# Patient Record
Sex: Female | Born: 1935 | Race: White | Hispanic: No | Smoking: Never smoker
Health system: Southern US, Community
[De-identification: ages and names within clinical notes are randomized; demographics above are authoritative.]

## PROBLEM LIST (undated history)

## (undated) DIAGNOSIS — I499 Cardiac arrhythmia, unspecified: Secondary | ICD-10-CM

## (undated) DIAGNOSIS — I10 Essential (primary) hypertension: Secondary | ICD-10-CM

## (undated) DIAGNOSIS — I509 Heart failure, unspecified: Secondary | ICD-10-CM

## (undated) HISTORY — PX: ABDOMINAL HYSTERECTOMY: SHX81

## (undated) HISTORY — DX: Heart failure, unspecified: I50.9

## (undated) HISTORY — DX: Cardiac arrhythmia, unspecified: I49.9

## (undated) HISTORY — DX: Essential (primary) hypertension: I10

## (undated) HISTORY — PX: EYE SURGERY: SHX253

---

## 2005-12-09 DIAGNOSIS — Z950 Presence of cardiac pacemaker: Secondary | ICD-10-CM | POA: Insufficient documentation

## 2005-12-09 HISTORY — PX: PACEMAKER PLACEMENT: SHX43

## 2013-12-09 DIAGNOSIS — Z950 Presence of cardiac pacemaker: Secondary | ICD-10-CM

## 2013-12-09 HISTORY — DX: Presence of cardiac pacemaker: Z95.0

## 2021-12-07 DIAGNOSIS — M7030 Other bursitis of elbow, unspecified elbow: Secondary | ICD-10-CM | POA: Insufficient documentation

## 2021-12-09 HISTORY — PX: MOHS SURGERY: SUR867

## 2022-02-03 ENCOUNTER — Emergency Department
Admission: EM | Admit: 2022-02-03 | Discharge: 2022-02-03 | Disposition: A | Discharge status: Home / Self Care | Payer: Medicare PPO | Attending: Emergency Medicine | Admitting: Emergency Medicine

## 2022-02-03 ENCOUNTER — Emergency Department: Payer: Medicare PPO

## 2022-02-03 DIAGNOSIS — I509 Heart failure, unspecified: Secondary | ICD-10-CM | POA: Diagnosis not present

## 2022-02-03 DIAGNOSIS — W228XXA Striking against or struck by other objects, initial encounter: Secondary | ICD-10-CM | POA: Diagnosis not present

## 2022-02-03 DIAGNOSIS — R7989 Other specified abnormal findings of blood chemistry: Secondary | ICD-10-CM | POA: Diagnosis not present

## 2022-02-03 DIAGNOSIS — S81812A Laceration without foreign body, left lower leg, initial encounter: Secondary | ICD-10-CM | POA: Diagnosis not present

## 2022-02-03 DIAGNOSIS — Z20822 Contact with and (suspected) exposure to covid-19: Secondary | ICD-10-CM | POA: Diagnosis not present

## 2022-02-03 DIAGNOSIS — I4891 Unspecified atrial fibrillation: Secondary | ICD-10-CM

## 2022-02-03 DIAGNOSIS — R0602 Shortness of breath: Secondary | ICD-10-CM | POA: Diagnosis not present

## 2022-02-03 DIAGNOSIS — S8992XA Unspecified injury of left lower leg, initial encounter: Secondary | ICD-10-CM | POA: Diagnosis present

## 2022-02-03 DIAGNOSIS — D649 Anemia, unspecified: Secondary | ICD-10-CM | POA: Diagnosis not present

## 2022-02-03 DIAGNOSIS — R051 Acute cough: Secondary | ICD-10-CM

## 2022-02-03 DIAGNOSIS — Y92481 Parking lot as the place of occurrence of the external cause: Secondary | ICD-10-CM | POA: Diagnosis not present

## 2022-02-03 DIAGNOSIS — Z7901 Long term (current) use of anticoagulants: Secondary | ICD-10-CM | POA: Diagnosis not present

## 2022-02-03 LAB — CBC WITH DIFFERENTIAL/PLATELET
Abs Immature Granulocytes: 0.02 10*3/uL (ref 0.00–0.07)
Basophils Absolute: 0 10*3/uL (ref 0.0–0.1)
Basophils Relative: 0 %
Eosinophils Absolute: 0.2 10*3/uL (ref 0.0–0.5)
Eosinophils Relative: 4 %
HCT: 32.6 % — ABNORMAL LOW (ref 36.0–46.0)
Hemoglobin: 10.7 g/dL — ABNORMAL LOW (ref 12.0–15.0)
Immature Granulocytes: 0 %
Lymphocytes Relative: 16 %
Lymphs Abs: 1 10*3/uL (ref 0.7–4.0)
MCH: 32.8 pg (ref 26.0–34.0)
MCHC: 32.8 g/dL (ref 30.0–36.0)
MCV: 100 fL (ref 80.0–100.0)
Monocytes Absolute: 0.8 10*3/uL (ref 0.1–1.0)
Monocytes Relative: 12 %
Neutro Abs: 4.3 10*3/uL (ref 1.7–7.7)
Neutrophils Relative %: 68 %
Platelets: 198 10*3/uL (ref 150–400)
RBC: 3.26 MIL/uL — ABNORMAL LOW (ref 3.87–5.11)
RDW: 14.3 % (ref 11.5–15.5)
WBC: 6.3 10*3/uL (ref 4.0–10.5)
nRBC: 0.3 % — ABNORMAL HIGH (ref 0.0–0.2)

## 2022-02-03 LAB — BASIC METABOLIC PANEL
Anion gap: 10 (ref 5–15)
BUN: 32 mg/dL — ABNORMAL HIGH (ref 8–23)
CO2: 25 mmol/L (ref 22–32)
Calcium: 8.8 mg/dL — ABNORMAL LOW (ref 8.9–10.3)
Chloride: 98 mmol/L (ref 98–111)
Creatinine, Ser: 0.72 mg/dL (ref 0.44–1.00)
GFR, Estimated: >60 mL/min (ref >60)
Glucose, Bld: 103 mg/dL — ABNORMAL HIGH (ref 70–99)
Potassium: 4.1 mmol/L (ref 3.5–5.1)
Sodium: 133 mmol/L — ABNORMAL LOW (ref 135–145)

## 2022-02-03 LAB — TROPONIN I (HIGH SENSITIVITY)
Troponin I (High Sensitivity): 15 ng/L (ref <18)
Troponin I (High Sensitivity): 16 ng/L (ref <18)

## 2022-02-03 LAB — BRAIN NATRIURETIC PEPTIDE: B Natriuretic Peptide: 648.1 pg/mL — ABNORMAL HIGH (ref 0.0–100.0)

## 2022-02-03 LAB — RESP PANEL BY RT-PCR (FLU A&B, COVID) ARPGX2
Influenza A by PCR: NEGATIVE
Influenza B by PCR: NEGATIVE
SARS Coronavirus 2 by RT PCR: NEGATIVE

## 2022-02-03 MED ORDER — FUROSEMIDE 40 MG PO TABS
40.0000 mg | ORAL_TABLET | Freq: Once | ORAL | Status: AC
  Administered 2022-02-03: 40 mg via ORAL
  Filled 2022-02-03: qty 1

## 2022-02-03 MED ORDER — MICROFIBRILLAR COLL HEMOSTAT EX PADS
MEDICATED_PAD | Freq: Once | CUTANEOUS | Status: DC
  Filled 2022-02-03: qty 1

## 2022-02-03 MED ORDER — ACETAMINOPHEN 500 MG PO TABS
1000.0000 mg | ORAL_TABLET | Freq: Once | ORAL | Status: AC
  Administered 2022-02-03: 1000 mg via ORAL
  Filled 2022-02-03: qty 2

## 2022-02-03 MED ORDER — TETANUS-DIPHTH-ACELL PERTUSSIS 5-2.5-18.5 LF-MCG/0.5 IM SUSY: 0.5000 mL | PREFILLED_SYRINGE | Freq: Once | INTRAMUSCULAR | Status: DC

## 2022-02-03 MED ORDER — LIDOCAINE-EPINEPHRINE 2 %-1:100000 IJ SOLN
30.0000 mL | Freq: Once | INTRAMUSCULAR | Status: AC
Start: 2022-02-03 — End: 2022-02-03
  Administered 2022-02-03: 30 mL

## 2022-02-03 NOTE — ED Notes (Signed)
Family states patient had similar injury last year and received a tetanus injection then. Dr. Tamala Julian is aware.

## 2022-02-03 NOTE — ED Triage Notes (Signed)
Pt via POV from home. Pt accompanied by daughter, family was initially here for cough. Pt has a hx of CHF, when trying to come in to daughter accidentally closed the door on the pt's L leg. Pt has gash on the LLE on the calf, on assessment bleeding is not controlled. Pt roomed to 47. Denies any blood thinners. Dr. Michiel Sites at bedside.

## 2022-02-03 NOTE — ED Notes (Signed)
Surgicell was used to cover the wound per Dr. Katrinka Blazing, dressed with a non-adherent pad and wrapped with Kling. Patient tolerated procedure well.

## 2022-02-03 NOTE — ED Provider Notes (Signed)
[facility] Provider Note    Event Date/Time   First MD Initiated Contact with Patient 02/03/22 1310     (approximate)   History   Laceration   HPI  [name] is a 88 y.o. female with a past medical history of A-fib on Aggrenox, CHF who presents evaluation of 2 issues.  First patient initially was coming to the hospital to be evaluated in outpatient clinic for evaluation of a couple days of cough and congestion and some shortness of breath.  Patient states yesterday she also had a little twinge of chest discomfort.  No chest pain today.  No fevers, vomiting, diarrhea or rash or any other acute sick symptoms prompting them to initially seek evaluation today.  However while in the parking lot patient was struck by a car door in her left posterior calf.  No other injuries associated with this including any pain in ankle or knee.  No other recent injuries per daughter or patient.  Patient is just on ASA but no other anticoagulation.      Physical Exam  Triage Vital Signs: ED Triage Vitals  Enc Vitals Group     BP      Pulse      Resp      Temp      Temp src      SpO2      Weight      Height      Head Circumference      Peak Flow      Pain Score      Pain Loc      Pain Edu?      Excl. in GC?     Most recent vital signs: Vitals:   02/03/22 1317  BP: 114/74  Pulse: 78  Resp: 18  Temp: 98.4 F (36.9 C)  SpO2: 98%    General: Awake, no distress.  CV:  2+ radial pulse. Resp:  Normal effort.  Clear bilaterally. Abd:  No distention.  Soft. Other:  Bilateral lower extreme edema.  Is approximately 4 cm laceration over the posterior lateral calf down to the subcu fat.   ED Results / Procedures / Treatments  Labs (all labs ordered are listed, but only abnormal results are displayed) Labs Reviewed  RESP PANEL BY RT-PCR (FLU A&B, COVID) ARPGX2  CBC WITH DIFFERENTIAL/PLATELET  BASIC METABOLIC PANEL  BRAIN NATRIURETIC PEPTIDE  TROPONIN I  (HIGH SENSITIVITY)  TROPONIN I (HIGH SENSITIVITY)     EKG  EKG is markable for A-fib with intermittently paced ventricular complexes and nonspecific ST changes in inferior and lateral leads.   RADIOLOGY Chest x-ray my interpretation shows cardiomegaly and some bilateral interstitial markings concerning for edema.  There are small bilateral pleural effusions.  No focal consolidation, or other acute process.  I also reviewed radiologist interpretation and agree with their findings.  Plain film the left tib-fib interpreted by myself shows no fracture dislocation.  I also reviewed radiologist interpretation and agree with their findings of no underlying orthopedic injury or radiopaque foreign bodies.  PROCEDURES:  Critical Care performed: No  ..Laceration Repair  Date/Time: 02/03/2022 2:19 PM Performed by: Gilles Chiquito, MD Authorized by: Gilles Chiquito, MD   Consent:    Consent obtained:  Verbal   Consent given by:  Patient   Risks, benefits, and alternatives were discussed: yes     Risks discussed:  Pain, infection, poor cosmetic result, need for additional repair and poor wound healing Universal protocol:  Patient identity confirmed:  Verbally with patient Anesthesia:    Anesthesia method:  Local infiltration   Local anesthetic:  Lidocaine 1% WITH epi Laceration details:    Location:  Leg   Leg location:  L lower leg   Length (cm):  5   Depth (mm):  0.5 Exploration:    Limited defect created (wound extended): no     Hemostasis achieved with:  Epinephrine and direct pressure   Imaging obtained: x-ray     Imaging outcome: foreign body not noted     Wound exploration: entire depth of wound visualized     Contaminated: no   Treatment:    Amount of cleaning:  Extensive   Irrigation solution:  Sterile saline   Irrigation method:  Syringe   Visualized foreign bodies/material removed: no     Debridement:  None   Undermining:  None Skin repair:    Repair method:   Sutures   Suture size:  5-0   Suture material:  Prolene   Suture technique:  Simple interrupted (+3 additonal monocryl applied after initial repair for some persistent oozing)   Number of sutures:  16 (11 prolene, 2 vicryl) Approximation:    Approximation:  Loose Repair type:    Repair type:  Intermediate Post-procedure details:    Dressing:  Open (no dressing)   Procedure completion:  Tolerated well, no immediate complications   MEDICATIONS ORDERED IN ED: Medications  microfibrllar collagen (AVITENE) pad (has no administration in time range)  lidocaine-EPINEPHrine (XYLOCAINE W/EPI) 2 %-1:100000 (with pres) injection 30 mL (30 mLs Infiltration Given 02/03/22 1421)  acetaminophen (TYLENOL) tablet 1,000 mg (1,000 mg Oral Given 02/03/22 1421)     IMPRESSION / MDM / ASSESSMENT AND PLAN / ED COURSE  I reviewed the triage vital signs and the nursing notes.                              With regard to patient's cough and shortness of breath congestion differential considerations include pneumonia, bronchitis, CHF, arrhythmia, anemia, GERD, with lower suspicion for PE given absence of any tachycardia, tachypnea and patiently seeming much more concerned with her congestion and cough with only minimal shortness of breath with exertion.  With regard to the left posterior calf wound it appears to be hemostatic on my exam.  She is able to flex and extend at the knee and distally at the ankle.  Somewhat difficult to palpate pulses due to extent of edema.  Sensation is intact to light touch throughout distally.  No other evidence of trauma to the leg.  Laceration repaired per procedure note above.  Tetanus is already up-to-date.  Surgicel applied due to some persistent oozing with subsequent successful hemostasis.  Chest x-ray my interpretation shows cardiomegaly and some bilateral interstitial markings concerning for edema.  There are small bilateral pleural effusions.  No focal consolidation, or other  acute process.  As reviewed radiologist interpretation and agree with their findings.  Plain film the left tib-fib interpreted by myself shows no fracture dislocation.  I also reviewed radiologist interpretation and agree with their findings of no underlying orthopedic injury or radiopaque foreign bodies.  EKG is markable for A-fib with intermittently paced ventricular complexes and nonspecific ST changes in inferior and lateral leads.  Given nonelevated troponin obtained greater than 3 hours after symptom onset of a very low suspicion for ACS.  BMP without significant electrolyte or metabolic derangements.  BNP is elevated 648 consistent  with findings on chest x-ray and exam for mild volume overload.  I suspect mild CHF exacerbation.  CBC without leukocytosis and some mild anemia with hemoglobin of 10.7.  Discussed with patient taking extra dose of Lasix this evening and increasing her Lasix to 40 twice daily over the next 2 days until she can follow-up with her cardiologist.  She has instructions apparently to do this already and is amenable this plan.  She will follow-up with PCP for wound check and suture removal in 7 to 10 days.  Also placed referral for heart failure clinic because she cannot get into see her cardiologist in the next 2 or 3 days.  Low suspicion for other immediate life-threatening with allergy.  No evidence of respiratory failure.  At this point I think patient stable for discharge with outpatient follow-up.  Discharged in stable condition.  Strict return precautions advised and discussed.        FINAL CLINICAL IMPRESSION(S) / ED DIAGNOSES   Final diagnoses:  Acute cough  Laceration of left lower extremity, initial encounter     Rx / DC Orders   ED Discharge Orders     None        Note:  This document was prepared using Dragon voice recognition software and may include unintentional dictation errors.   Gilles Chiquito, MD 02/03/22 [phone]

## 2022-02-04 ENCOUNTER — Ambulatory Visit: Payer: Self-pay | Admitting: *Deleted

## 2022-02-04 NOTE — Telephone Encounter (Signed)
I returned pt's daughter's call.  Hal Morales.  Mother was seen in the ED yesterday.  She has a wound on her leg that is still leaking blood.   [name] seeking advice before changing drsg today as instructed per ED instructions.   Reason for Disposition  Sutured or stapled surgical incision, questions about  Answer Assessment - Initial Assessment Questions 1. SYMPTOM: "What's the main symptom you're concerned about?" (e.g., drainage, incision opening up, pain, redness)     I returned call to daughter.   Mother on blood thinner.  Her wound is bleeding a little.   I'm to change the drsg today.    It's bleeding through the drsg.    She has 13 sutures from a car door injury.   She's also in CHF.    They stitched the wound yesterday.     There is blood around the leg on left lower leg.    2. ONSET: "When did bleeding  start?"     The bleeding was stopped before leaving the ED.   This morning it started bleeding. 3. SURGERY: "What surgery did you have?"     Had a laceration sutured yesterday.  She's on Aggrenox.    4. DATE of SURGERY: "When was the surgery?"      Yesterday in ED 5. INCISION SITE: "Where is the incision located?"      Left lower leg calf 6. REDNESS: "Is there any redness at the incision site?" If yes, ask: "How wide across is the redness?" (Inches, centimeters)      *No Answer* 7. PAIN: "Is there any pain?" If Yes, ask: "How bad is it?"  (Scale 1-10; or mild, moderate, severe)   - NONE (0): no pain   - MILD (1-3): doesn't interfere with normal activities    - MODERATE (4-7): interferes with normal activities or awakens from sleep    - SEVERE (8-10): excruciating pain, unable to do any normal activities     *No Answer* 8. BLEEDING: "Is there any bleeding?" If Yes, ask: "How much?" and "Where?"     Yes on blood thinners 9. DRAINAGE: "Is there any drainage from the incision site?" If yes, ask: "What color and how much?" (e.g., red, cloudy, pus; drops, teaspoon)     *No  Answer* 10. FEVER: "Do you have a fever?" If Yes, ask: "What is your temperature, how was it measured, and when did it start?"       *No Answer* 11. OTHER SYMPTOMS: "Do you have any other symptoms?" (e.g., dizziness, rash elsewhere on body, shaking chills, weakness)       *No Answer*  Protocols used: Post-Op Incision Symptoms and Questions-A-AH

## 2022-02-04 NOTE — Telephone Encounter (Signed)
°  Chief Complaint: laceration that is sutured from yesterday is oozing blood through the drsg.   On blood thinners.   How should I change the drsg? Symptoms: Instructed to change drsg to today and instructions given but wanted to know what to do about the bleeding. Frequency: Sutured yesterday in the ED.   Pertinent Negatives: Patient denies infection.   Disposition: [] ED /[] Urgent Care (no appt availability in office) / [] Appointment(In office/virtual)/ []  [facility]/ [x] Home Care/ [] Refused Recommended Disposition /[] [place] [facility]/ []  Follow-up with PCP Additional Notes: Spoke with pt's daughter .   Mother is from out of  town staying with daughter so doesn't have a PCP here.   Does have an appt with the CHF Clinic for tomorrow.

## 2022-02-05 ENCOUNTER — Ambulatory Visit (HOSPITAL_BASED_OUTPATIENT_CLINIC_OR_DEPARTMENT_OTHER): Payer: Medicare PPO | Admitting: Family

## 2022-02-05 ENCOUNTER — Telehealth: Payer: Self-pay | Admitting: Family

## 2022-02-05 ENCOUNTER — Other Ambulatory Visit
Admission: RE | Admit: 2022-02-05 | Discharge: 2022-02-05 | Disposition: A | Discharge status: Home / Self Care | Payer: Medicare PPO | Source: Ambulatory Visit | Attending: Family | Admitting: Family

## 2022-02-05 ENCOUNTER — Other Ambulatory Visit: Payer: Self-pay

## 2022-02-05 ENCOUNTER — Encounter: Payer: Self-pay | Admitting: Family

## 2022-02-05 VITALS — BP 141/70 | HR 81 | Resp 16 | Ht 59.0 in | Wt 112.4 lb

## 2022-02-05 DIAGNOSIS — R0989 Other specified symptoms and signs involving the circulatory and respiratory systems: Secondary | ICD-10-CM | POA: Insufficient documentation

## 2022-02-05 DIAGNOSIS — H3530 Unspecified macular degeneration: Secondary | ICD-10-CM | POA: Insufficient documentation

## 2022-02-05 DIAGNOSIS — H9190 Unspecified hearing loss, unspecified ear: Secondary | ICD-10-CM | POA: Insufficient documentation

## 2022-02-05 DIAGNOSIS — I4891 Unspecified atrial fibrillation: Secondary | ICD-10-CM | POA: Insufficient documentation

## 2022-02-05 DIAGNOSIS — X58XXXA Exposure to other specified factors, initial encounter: Secondary | ICD-10-CM | POA: Insufficient documentation

## 2022-02-05 DIAGNOSIS — Y92481 Parking lot as the place of occurrence of the external cause: Secondary | ICD-10-CM | POA: Insufficient documentation

## 2022-02-05 DIAGNOSIS — R0601 Orthopnea: Secondary | ICD-10-CM | POA: Insufficient documentation

## 2022-02-05 DIAGNOSIS — Z79899 Other long term (current) drug therapy: Secondary | ICD-10-CM | POA: Insufficient documentation

## 2022-02-05 DIAGNOSIS — I5043 Acute on chronic combined systolic (congestive) and diastolic (congestive) heart failure: Secondary | ICD-10-CM

## 2022-02-05 DIAGNOSIS — I480 Paroxysmal atrial fibrillation: Secondary | ICD-10-CM | POA: Insufficient documentation

## 2022-02-05 DIAGNOSIS — M791 Myalgia, unspecified site: Secondary | ICD-10-CM | POA: Insufficient documentation

## 2022-02-05 DIAGNOSIS — Z950 Presence of cardiac pacemaker: Secondary | ICD-10-CM | POA: Insufficient documentation

## 2022-02-05 DIAGNOSIS — S81819A Laceration without foreign body, unspecified lower leg, initial encounter: Secondary | ICD-10-CM | POA: Insufficient documentation

## 2022-02-05 DIAGNOSIS — S81812D Laceration without foreign body, left lower leg, subsequent encounter: Secondary | ICD-10-CM

## 2022-02-05 LAB — BASIC METABOLIC PANEL
Anion gap: 7 (ref 5–15)
BUN: 34 mg/dL — ABNORMAL HIGH (ref 8–23)
CO2: 29 mmol/L (ref 22–32)
Calcium: 9.1 mg/dL (ref 8.9–10.3)
Chloride: 96 mmol/L — ABNORMAL LOW (ref 98–111)
Creatinine, Ser: 0.82 mg/dL (ref 0.44–1.00)
GFR, Estimated: >60 mL/min (ref >60)
Glucose, Bld: 100 mg/dL — ABNORMAL HIGH (ref 70–99)
Potassium: 4.2 mmol/L (ref 3.5–5.1)
Sodium: 132 mmol/L — ABNORMAL LOW (ref 135–145)

## 2022-02-05 MED ORDER — METOLAZONE 2.5 MG PO TABS: 2.5000 mg | ORAL_TABLET | Freq: Every day | ORAL | 0 refills | Status: DC

## 2022-02-05 NOTE — Patient Instructions (Addendum)
Metolazone 2.5 mg was sent to your pharmacy, pick this up today.  Start taking metolazone on Wednesday morning and Thursday morning  Take this 30 minutes BEFORE you take your Lasix  Call us Friday am and   Return next week

## 2022-02-05 NOTE — Progress Notes (Signed)
Name: [name]      DOB:  02/02/1925     MRN:  [id number]  HPI  [name] is a 88 year old female with a history of afib, pacemaker, and unspecified heart failure. She has been staying with her daughter in the area for several months and is considering moving here to live with her. Has a history of heart failure since "2007", reports she does not recall having an echo or what her heart function may be.   Recently in the ED on 02/03/22 for SOB and cough, as well as a leg laceration that occurred while in the ED parking lot. CXR showed prominent central pulmonary vessels and increased interstitial markings, suggesting CHF. BNP 648. She was given lasix 40 mg PO to take, and was felt to be stable to dc back home.   She weighs daily and her family keeps a meticulous diary of what she eats and her weights. On 01/31/22, her weigh had fluctuated up 3 lbs and she took an additional dose of lasix, per her plan with her cardiologist. On 02/03/22, her weight had dropped 2 lbs but she still had a cough and pedal edema was worse than usual, they elected to go to the ED. She was instructed to take an additional dose of lasix x 3 days and follow up with the HF clinic.   She presents today to the Heart Failure clinic with a chief complaint of worsening fatigue, SOB, orthopnea, worsening pedal edema and cough. She denies CP, palpitations, dizziness, or abdominal edema.  She ambulated into the clinic with assistance of rollator, no DOE, able to speak in complete sentences and in no apparent distress.    Past Medical History:  Diagnosis Date   Congestive heart failure (CHF) (HCC)    Pacemaker 2015    No family history on file. Social History   Tobacco Use   Smoking status: Not on file   Smokeless tobacco: Not on file  Substance Use Topics   Alcohol use: Not on file   Not on File  Prior to Admission medications   Medication Sig Start Date End Date Taking? Authorizing Provider  amLODipine (NORVASC) 5 MG  tablet Take 5 mg by mouth daily. 11/16/21   [provider]  atorvastatin (LIPITOR) 10 MG tablet Take 10 mg by mouth daily. 01/11/22   [provider]  dipyridamole-aspirin (AGGRENOX) 200-25 MG 12hr capsule Take 1 capsule by mouth 2 (two) times daily. 12/13/21   [provider]  furosemide (LASIX) 40 MG tablet Take 40 mg by mouth daily as needed. 12/14/21   [provider]  losartan (COZAAR) 50 MG tablet Take 50 mg by mouth 2 (two) times daily. 01/11/22   [provider]  metoprolol (TOPROL-XL) 200 MG 24 hr tablet Take 200 mg by mouth daily.    [provider]  Multiple Vitamins-Minerals (PRESERVISION AREDS 2 PO) Take 1 tablet by mouth in the morning and at bedtime.    [provider]  multivitamin-iron-minerals-folic acid (CENTRUM) chewable tablet Chew 1 tablet by mouth daily.    [provider]  Omega-3 Fatty Acids (FISH OIL) 1000 MG CAPS Take 1 capsule by mouth daily.    [provider]  potassium chloride (KLOR-CON) 10 MEQ tablet Take 10 mEq by mouth daily.    [provider]      Vitals:   02/05/22 0911  BP: (!) 141/70  Pulse: 81  Resp: 16  SpO2: 100%  Weight: 112 lb 7 oz (  51 kg)  Height: 4\' 11"  (1.499 m)   Wt Readings from Last 3 Encounters:  02/05/22 112 lb 7 oz (51 kg)  02/03/22 114 lb (51.7 kg)   Lab Results  Component Value Date   CREATININE 0.82 02/05/2022   CREATININE 0.72 02/03/2022    Review of Systems  Constitutional:  Positive for malaise/fatigue.  HENT:  Positive for hearing loss.   Eyes:        Macular degeneration  Respiratory:  Positive for cough, sputum production and shortness of breath. Negative for wheezing.   Cardiovascular:  Positive for orthopnea and leg swelling. Negative for chest pain and palpitations.  Gastrointestinal: Negative.   Genitourinary: Negative.   Musculoskeletal:  Positive for myalgias.  Skin: Negative.   Neurological:  Negative for dizziness, loss of  consciousness, weakness and headaches.  Endo/Heme/Allergies:  Bruises/bleeds easily.  Psychiatric/Behavioral: Negative.      Physical Exam Vitals and nursing note reviewed. Exam conducted with a chaperone present (Daughter).  Constitutional:      General: She is not in acute distress.    Appearance: She is not toxic-appearing.  Neck:     Vascular: No JVD.  Cardiovascular:     Rate and Rhythm: Normal rate and regular rhythm.  Pulmonary:     Effort: Pulmonary effort is normal.     Breath sounds: Examination of the right-lower field reveals rales. Rales present. No wheezing or rhonchi.  Musculoskeletal:     Right lower leg: Edema (+2) present.     Left lower leg: Edema (+2) present.  Neurological:     Mental Status: She is alert and oriented to person, place, and time.     Assessment & Plan:  Unspecified heart failure - NYHA III - slightly volume overloaded, rales noted, endorses fatigue and SOB worse than normal - unclear EF, from out of town and no records to indicate - on cozaar, toprol, norvasc, lasix QD - add metolazone 2.5 mg po x 2 days, take 30 min prior to lasix, take potassium chloride with each dose of metolazone, take on Wed and Thurs of this week - call HF clinic Friday am to report weight and symptoms - wears compression socks daily, endorses that pedal edema is slightly worse than normal - goes to an adult day care each day, reports that she eats low sodium diet there - usually sleeps on 1 pillow, last two nights she has required 3  - will check BMP today - BNP 648 on 02/03/22  Atrial fibrillation - aggrenox - RRR today, has pacemaker - sees cardiologist [facility]) in [place], but if she moves here will need a new cardiologist  Leg laceration - bandage CDI, surrounding skin pink and dry   Medications bottles were brought and reviewed.  Call the office on Friday to report symptoms. Return in 1 week, sooner if needed.

## 2022-02-06 NOTE — Telephone Encounter (Signed)
Agree with NP student's plan to have patient take her potassium each day she takes metolazone.  ?

## 2022-02-13 ENCOUNTER — Other Ambulatory Visit
Admission: RE | Admit: 2022-02-13 | Discharge: 2022-02-13 | Disposition: A | Discharge status: Home / Self Care | Payer: Medicare PPO | Source: Ambulatory Visit | Attending: Family | Admitting: Family

## 2022-02-13 ENCOUNTER — Encounter: Payer: Self-pay | Admitting: Family

## 2022-02-13 ENCOUNTER — Other Ambulatory Visit: Payer: Self-pay

## 2022-02-13 ENCOUNTER — Ambulatory Visit (HOSPITAL_BASED_OUTPATIENT_CLINIC_OR_DEPARTMENT_OTHER): Payer: Medicare PPO | Admitting: Family

## 2022-02-13 VITALS — BP 142/79 | HR 86 | Resp 16 | Ht 59.0 in | Wt 115.0 lb

## 2022-02-13 DIAGNOSIS — I4891 Unspecified atrial fibrillation: Secondary | ICD-10-CM | POA: Insufficient documentation

## 2022-02-13 DIAGNOSIS — I890 Lymphedema, not elsewhere classified: Secondary | ICD-10-CM | POA: Insufficient documentation

## 2022-02-13 DIAGNOSIS — Z950 Presence of cardiac pacemaker: Secondary | ICD-10-CM | POA: Insufficient documentation

## 2022-02-13 DIAGNOSIS — S81812D Laceration without foreign body, left lower leg, subsequent encounter: Secondary | ICD-10-CM

## 2022-02-13 DIAGNOSIS — I509 Heart failure, unspecified: Secondary | ICD-10-CM | POA: Insufficient documentation

## 2022-02-13 DIAGNOSIS — I480 Paroxysmal atrial fibrillation: Secondary | ICD-10-CM | POA: Diagnosis not present

## 2022-02-13 DIAGNOSIS — S81812A Laceration without foreign body, left lower leg, initial encounter: Secondary | ICD-10-CM | POA: Insufficient documentation

## 2022-02-13 DIAGNOSIS — I5042 Chronic combined systolic (congestive) and diastolic (congestive) heart failure: Secondary | ICD-10-CM | POA: Insufficient documentation

## 2022-02-13 DIAGNOSIS — X58XXXA Exposure to other specified factors, initial encounter: Secondary | ICD-10-CM | POA: Insufficient documentation

## 2022-02-13 LAB — BASIC METABOLIC PANEL
Anion gap: 6 (ref 5–15)
BUN: 28 mg/dL — ABNORMAL HIGH (ref 8–23)
CO2: 31 mmol/L (ref 22–32)
Calcium: 8.6 mg/dL — ABNORMAL LOW (ref 8.9–10.3)
Chloride: 87 mmol/L — ABNORMAL LOW (ref 98–111)
Creatinine, Ser: 0.61 mg/dL (ref 0.44–1.00)
GFR, Estimated: >60 mL/min (ref >60)
Glucose, Bld: 102 mg/dL — ABNORMAL HIGH (ref 70–99)
Potassium: 4.2 mmol/L (ref 3.5–5.1)
Sodium: 124 mmol/L — ABNORMAL LOW (ref 135–145)

## 2022-02-13 MED ORDER — TORSEMIDE 20 MG PO TABS: 40.0000 mg | ORAL_TABLET | Freq: Every day | ORAL | 3 refills | Status: DC

## 2022-02-13 NOTE — Patient Instructions (Addendum)
Continue weighing daily and call for an overnight weight gain of 3 pounds or more or a weekly weight gain of more than 5 pounds. ? ? ?If you have voicemail, please make sure your mailbox is cleaned out so that we may leave a message and please make sure to listen to any voicemails.  ? ? ?Stop taking your furosemide as I am changing your fluid pill to torsemide. You will take 2 tablets every morning and can take an additional tablet if needed for above weight gain, worsening swelling or shortness of breath.   ? ? ?

## 2022-02-13 NOTE — Progress Notes (Signed)
Name: [name]      DOB:  1925/11/05     MRN:  [id number] ? ?HPI ? ?[name] is a 88 year old female with a history of afib, pacemaker, and unspecified heart failure.  ? ?Recently in the ED on 02/03/22 for SOB and cough, as well as a leg laceration that occurred while in the ED parking lot. CXR showed prominent central pulmonary vessels and increased interstitial markings, suggesting CHF. BNP 648. She was given lasix 40 mg PO to take, and was felt to be stable to dc back home.  ? ?She presents today for a follow-up visit with a chief complaint of moderate fatigue with minimal exertion. She describes this as chronic in nature. She has associated pedal edema along with this. She denies any difficulty sleeping, shortness of breath, cough, palpitations, chest pain or weight gain.  ? ?She says that she feels like the leg swelling is worse although she says that her cough has resolved after taking 2 doses of metolazone last week. She goes to an adult daycare daily. She and her daughter say that she drinks ~ 7 cups of fluids daily but is unable to quantify how much the cup holds. Denies that she's drinking too much.  ? ?Has been taking furosemide for ~ 20 years. Continues to have a wound on her left calf that has stitches still in. Does mention that she's had this "strange sensation" in her left lower leg, shin and calf area. Doesn't feel like a muscle cramp exactly but has occurred intermittently since she took metolazone last week.  ? ?Discussed needing to get an echo as well as get established with cardiology. She and daughter say that they've talked about this and would like to get established with [facility] cardiology.  ? ?Past Medical History:  ?Diagnosis Date  ? Arrhythmia   ? atrial fibrillation  ? Congestive heart failure (CHF) (HCC)   ? Pacemaker 2015  ? ? ?No family history on file. ?Social History  ? ?Tobacco Use  ? Smoking status: Never  ? Smokeless tobacco: Never  ?Substance Use Topics  ? Alcohol use: Not on  file  ? ?Allergies  ?Allergen Reactions  ? Codeine   ? ?  ?Prior to Admission medications   ?Medication Sig Start Date End Date Taking? Authorizing Provider  ?amLODipine (NORVASC) 5 MG tablet Take 5 mg by mouth daily. 11/16/21  Yes [provider]  ?atorvastatin (LIPITOR) 10 MG tablet Take 10 mg by mouth daily. 01/11/22  Yes [provider]  ?dipyridamole-aspirin (AGGRENOX) 200-25 MG 12hr capsule Take 1 capsule by mouth 2 (two) times daily. 12/13/21  Yes [provider]  ?furosemide (LASIX) 40 MG tablet Take 40 mg by mouth daily. Can take an extra as needed for weight gain 12/14/21  Yes [provider]  ?losartan (COZAAR) 50 MG tablet Take 50 mg by mouth 2 (two) times daily. 01/11/22  Yes [provider]  ?metolazone (ZAROXOLYN) 2.5 MG tablet Take 1 tablet (2.5 mg total) by mouth daily. 02/05/22 05/06/22 Yes Delma Freeze, FNP  ?metoprolol (TOPROL-XL) 200 MG 24 hr tablet Take 200 mg by mouth daily.   Yes [provider]  ?Multiple Vitamins-Minerals (PRESERVISION AREDS 2 PO) Take 1 tablet by mouth in the morning and at bedtime.   Yes [provider]  ?multivitamin-iron-minerals-folic acid (CENTRUM) chewable tablet Chew 1 tablet by mouth daily.   Yes [provider]  ?Omega-3 Fatty Acids (FISH OIL) 1000 MG CAPS Take 1 capsule by  mouth daily.   Yes [provider]  ?potassium chloride (KLOR-CON) 10 MEQ tablet Take 10 mEq by mouth as needed. Only if she takes an additional dose of lasix   Yes [provider]  ? ?Review of Systems  ?Constitutional:  Positive for malaise/fatigue.  ?HENT:  Positive for hearing loss. Negative for congestion.   ?Eyes: Negative.   ?     Macular degeneration  ?Respiratory:  Negative for cough, sputum production, shortness of breath and wheezing.   ?Cardiovascular:  Positive for orthopnea and leg swelling (worsening). Negative for chest pain and palpitations.  ?Gastrointestinal: Negative.   ?Genitourinary:  Negative.   ?Musculoskeletal:  Positive for myalgias.  ?Skin: Negative.   ?Neurological:  Negative for dizziness, loss of consciousness, weakness and headaches.  ?Endo/Heme/Allergies:  Bruises/bleeds easily.  ?Psychiatric/Behavioral: Negative.     ? ?Vitals:  ? 02/13/22 1513  ?BP: (!) 142/79  ?Pulse: 86  ?Resp: 16  ?SpO2: 93%  ?Weight: 115 lb (52.2 kg)  ?Height: 4\' 11"  (1.499 m)  ? ?Wt Readings from Last 3 Encounters:  ?02/13/22 115 lb (52.2 kg)  ?02/05/22 112 lb 7 oz (51 kg)  ?02/03/22 114 lb (51.7 kg)  ? ?Lab Results  ?Component Value Date  ? CREATININE 0.82 02/05/2022  ? CREATININE 0.72 02/03/2022  ? ?Physical Exam ?Vitals and nursing note reviewed. Exam conducted with a chaperone present (Daughter).  ?Constitutional:   ?   General: She is not in acute distress. ?   Appearance: She is not toxic-appearing.  ?Neck:  ?   Vascular: No JVD.  ?Cardiovascular:  ?   Rate and Rhythm: Normal rate and regular rhythm.  ?Pulmonary:  ?   Effort: Pulmonary effort is normal.  ?   Breath sounds: No wheezing, rhonchi or rales.  ?Musculoskeletal:  ?   Right lower leg: Edema (+2) present.  ?   Left lower leg: Edema (+2) present.  ?Neurological:  ?   Mental Status: She is alert and oriented to person, place, and time.  ?  ? ?Assessment & Plan: ? ?Unspecified heart failure ?- NYHA III ?- euvolemic today ?- weighing daily and home weight chart reviewed (111 lb yesterday at home); reminded to call for an overnight weight gain of > 2 pounds or a weekly weight gain of > 5 pounds ?- weight up 3 pounds from last visit here 10 days ago ?- took 2 doses of metolazone last week with resolution of her cough ?- goes to an adult day care each day, reports that she eats low sodium diet there ?- drinking ~ 7 cups of fluids daily; unable to quantify how many ounces the cup holds but doesn't think she's drinking too much as this is the normal amount for her ?- usually sleeps on 1 pillow, now sleeping on 2-3 ?- echo has been scheduled for 02/26/22 ?-  has been on furosemide for ~ 20 years so will change to torsemide 40mg  QAM with additional 20mg  PRN ?- check BMP today to make sure she's not hypokalemic ?- BNP 648 on 02/03/22 ? ?Atrial fibrillation ?- aggrenox ?- RRR today, has pacemaker ?- requests [facility] cardiology so this was scheduled with Dr. for 03/22/22 ?- BMP 02/05/22 reviewed and showed sodium 132, potassium 4.2, creatinine 0.82 & GFR >60 ? ?Left Leg laceration ?- bandage CDI, surrounding skin pink and dry ?- stiches remain intact ? ?4: Lymphedema- ?- stage 2 ?- wears compression socks daily, endorses that pedal edema is slightly worse than normal ?- doesn't resolve much  overnight and didn't notice any difference after taking metolazone ?- is quite active and walked into the office ?- goes to an adult daycare daily ?- consider compression boots after laceration heals ?- may also need vascular referral ? ? ?Medications bottles were brought and reviewed. ? ?Return in 3 weeks, sooner if needed.  ? ? ? ? ? ?

## 2022-02-14 ENCOUNTER — Telehealth: Payer: Self-pay | Admitting: Family

## 2022-02-14 ENCOUNTER — Other Ambulatory Visit: Payer: Self-pay | Admitting: Family

## 2022-02-14 ENCOUNTER — Encounter: Payer: Self-pay | Admitting: Family

## 2022-02-14 NOTE — Telephone Encounter (Signed)
Spoke with daughter regarding lab results obtained yesterday. Renal function and potassium levels are normal. Sodium level is low at 124. Spoke with Dr. Mariah Milling regarding possible treatment options.  ? ?No confusion or muscle weakness. Changed diuretic to torsemide yesterday. Patient's daughter says patient is drinking 7- 8 glasses of fluids daily and thinks the glasses are 8 ounces but will check today. Explained that she needed to keep daily fluid intake to 60-64 ounces.  ? ?Also advised daughter that patient could have some extra salt in her diet over the next few days while the new diuretic is taking effect. Explained that patient could have a few salty pretzels or chips depending on what she would like.  ? ?Will recheck labs on 02/20/22 at 3pm. Will consider stopping amlodipine if edema persists and change to isosorbide or hydralazine ? ?Patient's daughter was appreciative of the information and agreeable to this plan.  ?

## 2022-02-20 ENCOUNTER — Other Ambulatory Visit
Admission: RE | Admit: 2022-02-20 | Discharge: 2022-02-20 | Disposition: A | Discharge status: Home / Self Care | Payer: Medicare PPO | Source: Ambulatory Visit | Attending: Family | Admitting: Family

## 2022-02-20 ENCOUNTER — Other Ambulatory Visit: Payer: Self-pay

## 2022-02-20 DIAGNOSIS — Z01812 Encounter for preprocedural laboratory examination: Secondary | ICD-10-CM | POA: Diagnosis present

## 2022-02-20 LAB — BASIC METABOLIC PANEL
Anion gap: 6 (ref 5–15)
BUN: 38 mg/dL — ABNORMAL HIGH (ref 8–23)
CO2: 32 mmol/L (ref 22–32)
Calcium: 9 mg/dL (ref 8.9–10.3)
Chloride: 91 mmol/L — ABNORMAL LOW (ref 98–111)
Creatinine, Ser: 0.83 mg/dL (ref 0.44–1.00)
GFR, Estimated: >60 mL/min (ref >60)
Glucose, Bld: 116 mg/dL — ABNORMAL HIGH (ref 70–99)
Potassium: 4.1 mmol/L (ref 3.5–5.1)
Sodium: 129 mmol/L — ABNORMAL LOW (ref 135–145)

## 2022-02-21 ENCOUNTER — Other Ambulatory Visit: Payer: Self-pay | Admitting: Family

## 2022-02-21 ENCOUNTER — Telehealth: Payer: Self-pay

## 2022-02-21 NOTE — Progress Notes (Signed)
She requested to come for lab work on Wednesday, 3/22 at 3 PM.

## 2022-02-21 NOTE — Telephone Encounter (Addendum)
Message below relayed to patient's daughter. Patient's daughter acknowledged. She requests to come back for labwork on 02/27/22 at 3 PM. Will notify Clarisa Kindred, FNP.They had no further questions or concerns at this time. ?Suanne Marker, RN ?----- Message from Delma Freeze, FNP sent at 02/21/2022 11:16 AM EDT ----- ?Kidney function looks good. Sodium is still low but is improving. She can have a little more salt in her diet and continue her fluid pills. I would like to recheck this again in about a week. Time is up to her.  ?

## 2022-02-26 ENCOUNTER — Ambulatory Visit
Admission: RE | Admit: 2022-02-26 | Discharge: 2022-02-26 | Disposition: A | Discharge status: Home / Self Care | Payer: Medicare PPO | Source: Ambulatory Visit | Attending: Family | Admitting: Family

## 2022-02-26 ENCOUNTER — Other Ambulatory Visit: Payer: Self-pay

## 2022-02-26 DIAGNOSIS — Z950 Presence of cardiac pacemaker: Secondary | ICD-10-CM | POA: Insufficient documentation

## 2022-02-26 DIAGNOSIS — I5042 Chronic combined systolic (congestive) and diastolic (congestive) heart failure: Secondary | ICD-10-CM | POA: Diagnosis present

## 2022-02-26 DIAGNOSIS — I081 Rheumatic disorders of both mitral and tricuspid valves: Secondary | ICD-10-CM | POA: Insufficient documentation

## 2022-02-26 DIAGNOSIS — I509 Heart failure, unspecified: Secondary | ICD-10-CM | POA: Insufficient documentation

## 2022-02-26 LAB — ECHOCARDIOGRAM COMPLETE
AR max vel: 1.22 cm2
AV Area VTI: 1.18 cm2
AV Area mean vel: 1.26 cm2
AV Mean grad: 7 mmHg
AV Peak grad: 11.3 mmHg
Ao pk vel: 1.68 m/s
Area-P 1/2: 4.2 cm2
MV VTI: 1.41 cm2
S' Lateral: 2.63 cm

## 2022-02-26 NOTE — Progress Notes (Signed)
*  PRELIMINARY RESULTS* ?Echocardiogram ?2D Echocardiogram has been performed. ? ?[name] ?02/26/2022, 11:35 AM ?

## 2022-02-27 ENCOUNTER — Other Ambulatory Visit
Admission: RE | Admit: 2022-02-27 | Discharge: 2022-02-27 | Disposition: A | Discharge status: Home / Self Care | Payer: Medicare PPO | Source: Ambulatory Visit | Attending: Family | Admitting: Family

## 2022-02-27 DIAGNOSIS — Z01812 Encounter for preprocedural laboratory examination: Secondary | ICD-10-CM | POA: Diagnosis present

## 2022-02-27 LAB — BASIC METABOLIC PANEL
Anion gap: 7 (ref 5–15)
BUN: 35 mg/dL — ABNORMAL HIGH (ref 8–23)
CO2: 33 mmol/L — ABNORMAL HIGH (ref 22–32)
Calcium: 8.9 mg/dL (ref 8.9–10.3)
Chloride: 92 mmol/L — ABNORMAL LOW (ref 98–111)
Creatinine, Ser: 0.86 mg/dL (ref 0.44–1.00)
GFR, Estimated: >60 mL/min (ref >60)
Glucose, Bld: 129 mg/dL — ABNORMAL HIGH (ref 70–99)
Potassium: 3.5 mmol/L (ref 3.5–5.1)
Sodium: 132 mmol/L — ABNORMAL LOW (ref 135–145)

## 2022-03-07 ENCOUNTER — Encounter: Payer: Self-pay | Admitting: Family

## 2022-03-07 ENCOUNTER — Other Ambulatory Visit
Admission: RE | Admit: 2022-03-07 | Discharge: 2022-03-07 | Disposition: A | Discharge status: Home / Self Care | Payer: Medicare PPO | Source: Ambulatory Visit | Attending: Family | Admitting: Family

## 2022-03-07 ENCOUNTER — Ambulatory Visit (HOSPITAL_BASED_OUTPATIENT_CLINIC_OR_DEPARTMENT_OTHER): Payer: Medicare PPO | Admitting: Family

## 2022-03-07 VITALS — BP 144/80 | HR 84 | Resp 18 | Ht 59.0 in | Wt 111.4 lb

## 2022-03-07 DIAGNOSIS — I480 Paroxysmal atrial fibrillation: Secondary | ICD-10-CM

## 2022-03-07 DIAGNOSIS — Z950 Presence of cardiac pacemaker: Secondary | ICD-10-CM | POA: Insufficient documentation

## 2022-03-07 DIAGNOSIS — I890 Lymphedema, not elsewhere classified: Secondary | ICD-10-CM | POA: Insufficient documentation

## 2022-03-07 DIAGNOSIS — I4891 Unspecified atrial fibrillation: Secondary | ICD-10-CM | POA: Diagnosis not present

## 2022-03-07 DIAGNOSIS — Z7902 Long term (current) use of antithrombotics/antiplatelets: Secondary | ICD-10-CM | POA: Insufficient documentation

## 2022-03-07 DIAGNOSIS — I5042 Chronic combined systolic (congestive) and diastolic (congestive) heart failure: Secondary | ICD-10-CM | POA: Insufficient documentation

## 2022-03-07 DIAGNOSIS — Y92481 Parking lot as the place of occurrence of the external cause: Secondary | ICD-10-CM | POA: Insufficient documentation

## 2022-03-07 DIAGNOSIS — S81812D Laceration without foreign body, left lower leg, subsequent encounter: Secondary | ICD-10-CM | POA: Diagnosis not present

## 2022-03-07 DIAGNOSIS — X58XXXD Exposure to other specified factors, subsequent encounter: Secondary | ICD-10-CM | POA: Diagnosis not present

## 2022-03-07 LAB — BASIC METABOLIC PANEL
Anion gap: 7 (ref 5–15)
BUN: 36 mg/dL — ABNORMAL HIGH (ref 8–23)
CO2: 33 mmol/L — ABNORMAL HIGH (ref 22–32)
Calcium: 9 mg/dL (ref 8.9–10.3)
Chloride: 95 mmol/L — ABNORMAL LOW (ref 98–111)
Creatinine, Ser: 0.88 mg/dL (ref 0.44–1.00)
GFR, Estimated: >60 mL/min (ref >60)
Glucose, Bld: 110 mg/dL — ABNORMAL HIGH (ref 70–99)
Potassium: 3.6 mmol/L (ref 3.5–5.1)
Sodium: 135 mmol/L (ref 135–145)

## 2022-03-07 MED ORDER — EMPAGLIFLOZIN 10 MG PO TABS: 10.0000 mg | ORAL_TABLET | Freq: Every day | ORAL | 5 refills | Status: DC

## 2022-03-07 NOTE — Progress Notes (Signed)
? Patient ID: [name], female    DOB: December 31, 1924, 88 y.o.   MRN: [id number] ? ?HPI ? ?Tate Logalbo is a 88 year old female with a history of afib, pacemaker, and unspecified heart failure.  ? ?Echo report from 02/26/22 reviewed and showed an EF of 60-65% along with mild LVH, moderately elevated PA pressure, moderate MR, moderate/severe TR, mild/moderate AS and severe LAE.  ? ?Was in the ED on 02/03/22 for SOB and cough, as well as a leg laceration that occurred while in the ED parking lot. CXR showed prominent central pulmonary vessels and increased interstitial markings, suggesting CHF. BNP 648. She was given lasix 40 mg PO to take, and was felt to be stable to dc back home.  ? ?She presents today for a follow-up visit with a chief complaint of  ? ?Past Medical History:  ?Diagnosis Date  ? Arrhythmia   ? atrial fibrillation  ? Congestive heart failure (CHF) ([place])   ? Pacemaker 2015  ? ?No past surgical history on file. ?No family history on file. ?Social History  ? ?Tobacco Use  ? Smoking status: Never  ? Smokeless tobacco: Never  ?Substance Use Topics  ? Alcohol use: Not on file  ? ?Allergies  ?Allergen Reactions  ? Codeine   ? ?Prior to Admission medications   ?Medication Sig Start Date End Date Taking? Authorizing Provider  ?amLODipine (NORVASC) 5 MG tablet Take 5 mg by mouth daily. 11/16/21  Yes [provider]  ?atorvastatin (LIPITOR) 10 MG tablet Take 10 mg by mouth daily. 01/11/22  Yes [provider]  ?calcium carbonate (OS-CAL - DOSED IN MG OF ELEMENTAL CALCIUM) 1250 (500 Ca) MG tablet Take 1 tablet by mouth 2 (two) times daily with a meal.   Yes [provider]  ?dipyridamole-aspirin (AGGRENOX) 200-25 MG 12hr capsule Take 1 capsule by mouth 2 (two) times daily. 12/13/21  Yes [provider]  ?losartan (COZAAR) 50 MG tablet Take 50 mg by mouth 2 (two) times daily. 01/11/22  Yes [provider]  ?metoprolol (TOPROL-XL) 200 MG 24 hr tablet Take 200 mg by mouth daily.    Yes [provider]  ?Multiple Vitamins-Minerals (PRESERVISION AREDS 2 PO) Take 1 tablet by mouth in the morning and at bedtime.   Yes [provider]  ?multivitamin-iron-minerals-folic acid (CENTRUM) chewable tablet Chew 1 tablet by mouth daily.   Yes [provider]  ?Omega-3 Fatty Acids (FISH OIL) 1000 MG CAPS Take 1 capsule by mouth daily.   Yes [provider]  ?potassium chloride (KLOR-CON) 10 MEQ tablet Take 10 mEq by mouth as needed. Only if she takes an additional dose of lasix   Yes [provider]  ?torsemide (DEMADEX) 20 MG tablet Take 2 tablets (40 mg total) by mouth daily. And additional 20mg  if needed 02/13/22 05/14/22 Yes Alisa Graff, FNP  ? ? ? ?Review of Systems  ?Constitutional:  Positive for fatigue. Negative for appetite change.  ?HENT:  Negative for congestion, rhinorrhea and sore throat.   ?Eyes: Negative.   ?Respiratory:  Negative for cough and shortness of breath.   ?Cardiovascular:  Positive for leg swelling. Negative for chest pain and palpitations.  ?Gastrointestinal:  Negative for abdominal distention and abdominal pain.  ?Endocrine: Negative.   ?Genitourinary: Negative.   ?Musculoskeletal:  Negative for back pain and neck pain.  ?Skin:  Positive for wound (left calf).  ?Allergic/Immunologic: Negative.   ?Neurological:  Negative for dizziness and light-headedness.  ?Hematological:  Negative for adenopathy. Does  not bruise/bleed easily.  ?Psychiatric/Behavioral:  Negative for dysphoric mood and sleep disturbance (sleeping on 2 pillows). The patient is not nervous/anxious.   ? ?Vitals:  ? 03/07/22 1508  ?BP: (!) 144/80  ?Pulse: 84  ?Resp: 18  ?SpO2: 98%  ?Weight: 111 lb 6 oz (50.5 kg)  ?Height: 4\' 11"  (1.499 m)  ? ?Wt Readings from Last 3 Encounters:  ?03/07/22 111 lb 6 oz (50.5 kg)  ?02/13/22 115 lb (52.2 kg)  ?02/05/22 112 lb 7 oz (51 kg)  ? ?Lab Results  ?Component Value Date  ? CREATININE 0.88 03/07/2022  ? CREATININE 0.86 02/27/2022  ?  CREATININE 0.83 02/20/2022  ? ?Physical Exam ?Vitals and nursing note reviewed. Exam conducted with a chaperone present (daughter).  ?Constitutional:   ?   Appearance: Normal appearance.  ?HENT:  ?   Head: Normocephalic and atraumatic.  ?Cardiovascular:  ?   Rate and Rhythm: Normal rate and regular rhythm.  ?Pulmonary:  ?   Effort: Pulmonary effort is normal. No respiratory distress.  ?   Breath sounds: No wheezing or rales.  ?Abdominal:  ?   General: There is no distension.  ?   Palpations: Abdomen is soft.  ?   Tenderness: There is no abdominal tenderness.  ?Musculoskeletal:     ?   General: Tenderness present.  ?   Cervical back: Normal range of motion and neck supple.  ?   Right lower leg: Edema (2+ pitting) present.  ?   Left lower leg: Edema (2+ pitting) present.  ?Skin: ?   General: Skin is warm and dry.  ?   Comments: Open wound left calf draining  ?Neurological:  ?   General: No focal deficit present.  ?   Mental Status: She is alert and oriented to person, place, and time.  ?Psychiatric:     ?   Mood and Affect: Mood normal.     ?   Behavior: Behavior normal.     ?   Thought Content: Thought content normal.  ?  ?Assessment & Plan: ? ?Chronic heart failure with preserved ejection fraction with structural changes (LAE)- ?- NYHA III ?- euvolemic today ?- weighing daily and home weight chart reviewed (111 lb yesterday at home); reminded to call for an overnight weight gain of > 2 pounds or a weekly weight gain of > 5 pounds ?- weight down 4 pounds from last visit here 3 weeks ago ?- goes to an adult day care each day, reports that she eats low sodium diet there ?- daughter says patient was drinking too much fluids so they are now more aware of keeping her daily intake to 60-64 ounces/ daily ?- will check BMP today since sodium has been running low; hasn't been adding salt but has eaten some saltier food ?- will add jardiance 10mg  daily; 14 day voucher and 2 weeks of samples provided ?- check BMP next visit as  well ?- BNP 648 on 02/03/22 ? ?Atrial fibrillation ?- aggrenox ?- RRR today, has pacemaker ?- sees cardiology, Dr. Garen Lah 03/22/22 ?- BMP 02/27/22 reviewed and showed sodium 132, potassium 3.5, creatinine 0.86 & GFR >60 ? ?Left Leg laceration ?- stitches had been removed at urgent care ?- wound unwrapped and area around wound pink although wound is draining some yellow drainage ?- doesn't have PCP appointment until later in April so they may return to urgent care this weekend  ? ?4: Lymphedema- ?- stage 2 ?- wears compression socks daily, endorses that pedal edema is slightly worse  than normal ?- is quite active and walked into the office ?- goes to an adult daycare daily ?- consider compression boots after laceration heals ?- may also need vascular referral ? ? ?Medications bottles were brought and reviewed. ? ?Return in 1 month, sooner if needed ? ? ? ? ? ? ? ?

## 2022-03-07 NOTE — Patient Instructions (Addendum)
Continue weighing daily and call for an overnight weight gain of 3 pounds or more or a weekly weight gain of more than 5 pounds. ? ?If you have voicemail, please make sure your mailbox is cleaned out so that we may leave a message and please make sure to listen to any voicemails.  ? ? ?Begin jardiance 1 tablet every day ?

## 2022-03-19 ENCOUNTER — Encounter: Payer: Medicare PPO | Attending: Physician Assistant | Admitting: Physician Assistant

## 2022-03-19 DIAGNOSIS — I5042 Chronic combined systolic (congestive) and diastolic (congestive) heart failure: Secondary | ICD-10-CM | POA: Insufficient documentation

## 2022-03-19 DIAGNOSIS — Z950 Presence of cardiac pacemaker: Secondary | ICD-10-CM | POA: Diagnosis not present

## 2022-03-19 DIAGNOSIS — I110 Hypertensive heart disease with heart failure: Secondary | ICD-10-CM | POA: Diagnosis not present

## 2022-03-19 DIAGNOSIS — I87312 Chronic venous hypertension (idiopathic) with ulcer of left lower extremity: Secondary | ICD-10-CM | POA: Insufficient documentation

## 2022-03-19 DIAGNOSIS — L97822 Non-pressure chronic ulcer of other part of left lower leg with fat layer exposed: Secondary | ICD-10-CM | POA: Diagnosis not present

## 2022-03-19 DIAGNOSIS — Z7901 Long term (current) use of anticoagulants: Secondary | ICD-10-CM | POA: Insufficient documentation

## 2022-03-19 NOTE — Progress Notes (Signed)
[place], [name] ([id number]) ?Visit Report for 03/19/2022 ?Abuse Risk Screen Details ?Patient Name: [name], [name] ?Date of Service: 03/19/2022 1:30 PM ?Medical Record Number: [id number] ?Patient Account Number: [id number] ?Date of Birth/Sex: 1925/07/25 (88 y.o. Female) ?Treating RN: Cornell Barman ?Primary Care Demetris Capell: Candise Che Other Clinician: ?Referring Towanna Avery: Darylene Price ?Treating Antonette Hendricks/Extender: Jeri Cos ?Weeks in Treatment: 0 ?Abuse Risk Screen Items ?Answer ?ABUSE RISK SCREEN: ?Has anyone close to you tried to hurt or harm you recentlyo No ?Do you feel uncomfortable with anyone in your familyo No ?Has anyone forced you do things that you didnot want to doo No ?Electronic Signature(s) ?Signed: 03/19/2022 4:59:50 PM By: Gretta Cool, BSN, RN, CWS, Kim RN, BSN ?Entered By: Gretta Cool, BSN, RN, CWS, Kim on 03/19/2022 13:51:57 ?[place], [name] ([id number]) ?-------------------------------------------------------------------------------- ?Activities of Daily Living Details ?Patient Name: [name], [name] ?Date of Service: 03/19/2022 1:30 PM ?Medical Record Number: [id number] ?Patient Account Number: [id number] ?Date of Birth/Sex: Nov 18, 1925 (88 y.o. Female) ?Treating RN: Cornell Barman ?Primary Care Trinidee Schrag: Candise Che Other Clinician: ?Referring Eliel Dudding: Darylene Price ?Treating Anapaola Kinsel/Extender: Jeri Cos ?Weeks in Treatment: 0 ?Activities of Daily Living Items ?Answer ?Activities of Daily Living (Please select one for each item) ?Drive Automobile Not Able ?Take Medications Need Assistance ?Use Telephone Need Assistance ?Care for Appearance Completely Able ?Use Toilet Completely Able ?Bath / Shower Completely Able ?Dress Self Completely Able ?Feed Self Completely Able ?Walk Completely Able ?Get In / Out Bed Completely Able ?Housework Need Assistance ?Prepare Meals Need Assistance ?Handle Money Need Assistance ?Shop for Self Need Assistance ?Electronic Signature(s) ?Signed: 03/19/2022 4:59:50 PM By: Gretta Cool, BSN, RN, CWS, Kim  RN, BSN ?Entered By: Gretta Cool, BSN, RN, CWS, Kim on 03/19/2022 13:53:32 ?[place], [name] ([id number]) ?-------------------------------------------------------------------------------- ?Education Screening Details ?Patient Name: [name], [name] ?Date of Service: 03/19/2022 1:30 PM ?Medical Record Number: [id number] ?Patient Account Number: [id number] ?Date of Birth/Sex: [birth year] (88 y.o. Female) ?Treating RN: Cornell Barman ?Primary Care Bayler Gehrig: Candise Che Other Clinician: ?Referring Chalene Treu: Darylene Price ?Treating Jaquanna Ballentine/Extender: Jeri Cos ?Weeks in Treatment: 0 ?Primary Learner Assessed: Patient ?Learning Preferences/Education Level/Primary Language ?Learning Preference: Explanation, Demonstration ?Highest Education Level: College or Above ?Preferred Language: English ?Cognitive Barrier ?Language Barrier: No ?Translator Needed: No ?Memory Deficit: No ?Emotional Barrier: No ?Cultural/Religious Beliefs Affecting Medical Care: No ?Physical Barrier ?Impaired Vision: Yes Legally Blind ?Impaired Hearing: No ?Decreased Hand dexterity: No ?Knowledge/Comprehension ?Knowledge Level: High ?Comprehension Level: High ?Ability to understand written instructions: High ?Ability to understand verbal instructions: High ?Motivation ?Anxiety Level: Calm ?Cooperation: Cooperative ?Education Importance: Acknowledges Need ?Interest in Health Problems: Asks Questions ?Perception: Coherent ?Willingness to Engage in Self-Management ?High ?Activities: ?Readiness to Engage in Self-Management ?High ?Activities: ?Electronic Signature(s) ?Signed: 03/19/2022 4:59:50 PM By: Gretta Cool, BSN, RN, CWS, Kim RN, BSN ?Entered By: Gretta Cool, BSN, RN, CWS, Kim on 03/19/2022 13:54:04 ?[place], [name] ([id number]) ?-------------------------------------------------------------------------------- ?Fall Risk Assessment Details ?Patient Name: [name] ?Date of Service: 03/19/2022 1:30 PM ?Medical Record Number: [id number] ?Patient Account Number: [id number] ?Date of  Birth/Sex: 1925-06-26 (88 y.o. Female) ?Treating RN: Cornell Barman ?Primary Care Javeion Cannedy: Candise Che Other Clinician: ?Referring Brindle Leyba: Darylene Price ?Treating Dewane Timson/Extender: Jeri Cos ?Weeks in Treatment: 0 ?Fall Risk Assessment Items ?Have you had 2 or more falls in the last 12 monthso 0 Yes ?Have you had any fall that resulted in injury in the last 12 monthso 0 No ?FALLS RISK SCREEN ?History of falling - immediate or within 3 months 25 Yes ?Secondary diagnosis (Do you have 2 or more medical diagnoseso) 0 No ?Ambulatory aid ?None/bed rest/wheelchair/nurse 0 No ?Crutches/cane/walker 15 Yes ?Furniture 0 No ?Intravenous  therapy Access/Saline/Heparin Lock 0 No ?Gait/Transferring ?Normal/ bed rest/ wheelchair 0 No ?Weak (short steps with or without shuffle, stooped but able to lift head while walking, may ?10 Yes ?seek support from furniture) ?Impaired (short steps with shuffle, may have difficulty arising from chair, head down, impaired ?0 No ?balance) ?Mental Status ?Oriented to own ability 0 Yes ?Electronic Signature(s) ?Signed: 03/19/2022 4:59:50 PM By: Gretta Cool, BSN, RN, CWS, Kim RN, BSN ?Entered By: Gretta Cool, BSN, RN, CWS, Kim on 03/19/2022 13:54:50 ?[place], [name] ([id number]) ?-------------------------------------------------------------------------------- ?Foot Assessment Details ?Patient Name: [name], [name] ?Date of Service: 03/19/2022 1:30 PM ?Medical Record Number: [id number] ?Patient Account Number: [id number] ?Date of Birth/Sex: September 04, 1925 (88 y.o. Female) ?Treating RN: Cornell Barman ?Primary Care Japji Kok: Candise Che Other Clinician: ?Referring Griffon Herberg: Darylene Price ?Treating Kdyn Vonbehren/Extender: Jeri Cos ?Weeks in Treatment: 0 ?Foot Assessment Items ?Site Locations ?+ = Sensation present, - = Sensation absent, C = Callus, U = Ulcer ?R = Redness, W = Warmth, M = Maceration, PU = Pre-ulcerative lesion ?F = Fissure, S = Swelling, D = Dryness ?Assessment ?Right: Left: ?Other Deformity: No No ?Prior Foot  Ulcer: No No ?Prior Amputation: No No ?Charcot Joint: No No ?Ambulatory Status: Ambulatory With Help ?Assistance Device: Walker ?Gait: Steady ?Electronic Signature(s) ?Signed: 03/19/2022 4:59:50 PM By: Gretta Cool, BSN, RN, CWS, Kim RN, BSN ?Entered By: Gretta Cool, BSN, RN, CWS, Kim on 03/19/2022 13:55:31 ?[place], [name] ([id number]) ?-------------------------------------------------------------------------------- ?Nutrition Risk Screening Details ?Patient Name: JALAIL[place], [name] ?Date of Service: 03/19/2022 1:30 PM ?Medical Record Number: [id number] ?Patient Account Number: [id number] ?Date of Birth/Sex: [birth year] (88 y.o. Female) ?Treating RN: Cornell Barman ?Primary Care Sharbel Sahagun: Candise Che Other Clinician: ?Referring Nillie Bartolotta: Darylene Price ?Treating Eula Mazzola/Extender: Jeri Cos ?Weeks in Treatment: 0 ?Height (in): 59 ?Weight (lbs): 107 ?Body Mass Index (BMI): 21.6 ?Nutrition Risk Screening Items ?Score Screening ?NUTRITION RISK SCREEN: ?I have an illness or condition that made me change the kind and/or amount of food I eat 0 No ?I eat fewer than two meals per day 0 No ?I eat few fruits and vegetables, or milk products 0 No ?I have three or more drinks of beer, liquor or wine almost every day 0 No ?I have tooth or mouth problems that make it hard for me to eat 0 No ?I don't always have enough money to buy the food I need 0 No ?I eat alone most of the time 0 No ?I take three or more different prescribed or over-the-counter drugs a day 1 Yes ?Without wanting to, I have lost or gained 10 pounds in the last six months 0 No ?I am not always physically able to shop, cook and/or feed myself 0 No ?Nutrition Protocols ?Good Risk Protocol 0 No interventions needed ?Moderate Risk Protocol ?High Risk Proctocol ?Risk Level: Good Risk ?Score: 1 ?Electronic Signature(s) ?Signed: 03/19/2022 4:59:50 PM By: Gretta Cool, BSN, RN, CWS, Kim RN, BSN ?Entered By: Gretta Cool, BSN, RN, CWS, Kim on 03/19/2022 13:55:09 ?

## 2022-03-19 NOTE — Progress Notes (Addendum)
[place], [name] ([id number]) ?Visit Report for 03/19/2022 ?Chief Complaint Document Details ?Patient Name: [name], [name] ?Date of Service: 03/19/2022 1:30 PM ?Medical Record Number: [id number] ?Patient Account Number: [id number] ?Date of Birth/Sex: 22-Jan-1925 (88 y.o. Female) ?Treating RN: Huel Coventry ?Primary Care Provider: Doran Stabler Other Clinician: ?Referring Provider: Clarisa Kindred ?Treating Provider/Extender: Allen Derry ?Weeks in Treatment: 0 ?Information Obtained from: Patient ?Chief Complaint ?Left LE ulcer ?Electronic Signature(s) ?Signed: 03/19/2022 2:18:22 PM By: Lenda Kelp PA-C ?Entered By: Lenda Kelp on 03/19/2022 14:18:21 ?[place], [name] ([id number]) ?-------------------------------------------------------------------------------- ?Debridement Details ?Patient Name: [name] ?Date of Service: 03/19/2022 1:30 PM ?Medical Record Number: [id number] ?Patient Account Number: [id number] ?Date of Birth/Sex: 1925-03-18 (88 y.o. Female) ?Treating RN: Huel Coventry ?Primary Care Provider: Doran Stabler Other Clinician: ?Referring Provider: Clarisa Kindred ?Treating Provider/Extender: Allen Derry ?Weeks in Treatment: 0 ?Debridement Performed for ?Wound #1 Left,Posterior Lower Leg ?Assessment: ?Performed By: Physician Nelida Meuse., PA-C ?Debridement Type: Debridement ?Level of Consciousness (Pre- ?Awake and Alert ?procedure): ?Pre-procedure Verification/Time Out ?Yes - 14:28 ?Taken: ?Pain Control: Lidocaine ?Total Area Debrided (L x W): 4.5 (cm) x 4.3 (cm) = 19.35 (cm?) ?Tissue and other material ?Viable, Non-Viable, Slough, Subcutaneous, Biofilm, Slough ?debrided: ?Level: Skin/Subcutaneous Tissue ?Debridement Description: Excisional ?Instrument: Curette ?Bleeding: Minimum ?Hemostasis Achieved: Pressure ?Response to Treatment: Procedure was tolerated well ?Level of Consciousness (Post- ?Awake and Alert ?procedure): ?Post Debridement Measurements of Total Wound ?Length: (cm) 4.5 ?Width: (cm) 3.5 ?Depth: (cm)  3.1 ?Volume: (cm?) 38.347 ?Character of Wound/Ulcer Post Debridement: Stable ?Post Procedure Diagnosis ?Same as Pre-procedure ?Electronic Signature(s) ?Signed: 03/19/2022 4:59:50 PM By: Elliot Gurney, BSN, RN, CWS, Kim RN, BSN ?Signed: 03/19/2022 5:16:21 PM By: Lenda Kelp PA-C ?Entered By: Elliot Gurney, BSN, RN, CWS, Kim on 03/19/2022 27:74:12 ?[place], [name] ([id number]) ?-------------------------------------------------------------------------------- ?HPI Details ?Patient Name: [name] ?Date of Service: 03/19/2022 1:30 PM ?Medical Record Number: [id number] ?Patient Account Number: [id number] ?Date of Birth/Sex: [birth year] (88 y.o. Female) ?Treating RN: Huel Coventry ?Primary Care Provider: Doran Stabler Other Clinician: ?Referring Provider: Clarisa Kindred ?Treating Provider/Extender: Allen Derry ?Weeks in Treatment: 0 ?History of Present Illness ?HPI Description: 03-19-2022 upon evaluation today patient appears to be doing poorly in regard to the left posterior lower extremity ulcer. This ?is something that she actually struck on a car door getting out of the car and this was around January 18, 2022. She was going to urgent care ?due to her leg swelling when this occurred. Since that time she has had a tremendous amount of bleeding she also did have recommendations for ?medicated gauze to be used by urgent care. Initially she actually apparently had sutures placed over this area but then when the sutures were ?removed this dehisced to some degree. She was placed on Omnicef in the past there is no signs of infection right now but again that is definitely ?something we will need to keep a close eye on especially with the depth of the wound. The good news that she does not seem to be having any ?pain which is excellent. And again right now they have been using medicated gauze packing into the wound. She did have an x-ray on February ?26, 2023 which was negative for any signs of bone abnormality which is great news. Patient does  have a history of chronic venous ?insufficiency/hypertension with the lower extremity ulcer now at this point. She also has congestive heart failure, hypertension, she is on long- ?term anticoagulant therapy which is Aggrenox due to a pacemaker. ?Electronic Signature(s) ?Signed: 03/20/2022 8:46:06 AM By: Lenda Kelp PA-C ?Previous  Signature: 03/19/2022 5:18:09 PM Version By: Lenda Kelp PA-C ?Entered By: Lenda Kelp on 03/20/2022 08:46:06 ?[place], [name] ([id number]) ?-------------------------------------------------------------------------------- ?Physical Exam Details ?Patient Name: [name] ?Date of Service: 03/19/2022 1:30 PM ?Medical Record Number: [id number] ?Patient Account Number: [id number] ?Date of Birth/Sex: 03-18-1925 (88 y.o. Female) ?Treating RN: Huel Coventry ?Primary Care Provider: Doran Stabler Other Clinician: ?Referring Provider: Clarisa Kindred ?Treating Provider/Extender: Allen Derry ?Weeks in Treatment: 0 ?Constitutional ?sitting or standing blood pressure is within target range for patient.. pulse regular and within target range for patient.Marland Kitchen respirations regular, non- ?labored and within target range for patient.Marland Kitchen temperature within target range for patient.. Well-nourished and well-hydrated in no acute distress. ?Eyes ?conjunctiva clear no eyelid edema noted. pupils equal round and reactive to light and accommodation. ?Ears, Nose, Mouth, and Throat ?no gross abnormality of ear auricles or external auditory canals. normal hearing noted during conversation. mucus membranes moist. ?Respiratory ?normal breathing without difficulty. ?Cardiovascular ?2+ dorsalis pedis/posterior tibialis pulses. 1+ pitting edema of the bilateral lower extremities. ?Musculoskeletal ?normal gait and posture. no significant deformity or arthritic changes, no loss or range of motion, no clubbing. ?Psychiatric ?this patient is able to make decisions and demonstrates good insight into disease process. Alert and  Oriented x 3. pleasant and cooperative. ?Notes ?Upon inspection patient's wound actually did show some signs of necrotic tissue. Also think that we will get I have to see about packing into the ?area to some degree to help with appropriate granulation and draining of the wound so that it does not back up too much fluid and prevent a good ?healing response. The patient voiced understanding. Again she is legally blind but otherwise is in fairly good health all things considered. I do ?believe that she has some edema noted as well although with her heart failure I do not want to wrap anything too tightly I think that potentially ?utilizing Tubigrip could be of benefit here. ?Electronic Signature(s) ?Signed: 03/20/2022 8:46:53 AM By: Lenda Kelp PA-C ?Entered By: Lenda Kelp on 03/20/2022 08:46:53 ?[place], [name] ([id number]) ?-------------------------------------------------------------------------------- ?Physician Orders Details ?Patient Name: TODD, ARGABRIGHT ?Date of Service: 03/19/2022 1:30 PM ?Medical Record Number: [id number] ?Patient Account Number: [id number] ?Date of Birth/Sex: [birth year] (88 y.o. 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 ?Home Health ?o Home Health  Company: [facility] ?o ADMIT to Home Health for wound care. May utilize formulary equivalent dressing for wound treatment orders unless ?otherwise specified. Home Health Nurse may visit PRN to address patientos w

## 2022-03-19 NOTE — Progress Notes (Signed)
[name], [name] ([id number]) ?Visit Report for 03/19/2022 ?Allergy List Details ?Patient Name: [name], [name] ?Date of Service: 03/19/2022 1:30 PM ?Medical Record Number: [id number] ?Patient Account Number: [id number] ?Date of Birth/Sex: 09-Jun-1925 (88 y.o. Female) ?Treating RN: [name] ?Primary Care Rumaldo [name]: [name] Other Clinician: ?Referring [name]: [name] ?Treating Kennedee Kitzmiller/Extender: [name] ?Weeks in Treatment: 0 ?Allergies ?Active Allergies ?No Known Drug Allergies ?Allergy Notes ?Electronic Signature(s) ?Signed: 03/19/2022 4:59:50 PM By: Elliot Gurney, BSN, RN, CWS, Kim RN, BSN ?Entered By: Elliot Gurney, BSN, RN, CWS, [name] on 03/19/2022 13:47:52 ?[place], [name] ([id number]) ?-------------------------------------------------------------------------------- ?Arrival Information Details ?Patient Name: [name], [name] ?Date of Service: 03/19/2022 1:30 PM ?Medical Record Number: [id number] ?Patient Account Number: [id number] ?Date of Birth/Sex: [birth year] (88 y.o. Female) ?Treating RN: [name] ?Primary Care Calinda Stockinger: [name] Other Clinician: ?Referring Perley Arthurs: [name] ?Treating Gerrianne Aydelott/Extender: [name] ?Weeks in Treatment: 0 ?Visit Information ?Patient Arrived: [name] ?Arrival Time: 13:42 ?Accompanied By: self ?Transfer Assistance: None ?Patient Identification Verified: Yes ?Secondary Verification Process Completed: Yes ?Patient Has Alerts: Yes ?Patient Alerts: Patient on Blood Thinner ?Aggrenox ?NOT Diabetic ?03/19/22 ?Non-compressible >220 ?Electronic Signature(s) ?Signed: 03/19/2022 4:59:50 PM By: Elliot Gurney, BSN, RN, CWS, Kim RN, BSN ?Entered By: Elliot Gurney, BSN, RN, CWS, [name] on 03/19/2022 14:09:07 ?[name], [name] ([id number]) ?-------------------------------------------------------------------------------- ?Clinic Level of Care Assessment Details ?Patient Name: [name], [name] ?Date of Service: 03/19/2022 1:30 PM ?Medical Record Number: [id number] ?Patient Account Number: [id number] ?Date of Birth/Sex: 05/23/1925  (88 y.o. Female) ?Treating RN: [name] ?Primary Care Mckinleigh Schuchart: [name] Other Clinician: ?Referring Marquice Uddin: [name] ?Treating Libra Gatz/Extender: [name] ?Weeks in Treatment: 0 ?Clinic Level of Care Assessment Items ?TOOL 1 Quantity Score ?[]  - Use when EandM and Procedure is performed on INITIAL visit 0 ?ASSESSMENTS - Nursing Assessment / Reassessment ?X - General Physical Exam (combine w/ comprehensive assessment (listed just below) when performed on new ?1 20 ?pt. evals) ?X- 1 25 ?Comprehensive Assessment (HX, ROS, Risk Assessments, Wounds Hx, etc.) ?ASSESSMENTS - Wound and Skin Assessment / Reassessment ?[]  - Dermatologic / Skin Assessment (not related to wound area) 0 ?ASSESSMENTS - Ostomy and/or Continence Assessment and Care ?[]  - Incontinence Assessment and Management 0 ?[]  - 0 ?Ostomy Care Assessment and Management (repouching, etc.) ?PROCESS - Coordination of Care ?X - Simple Patient / Family Education for ongoing care 1 15 ?[]  - 0 ?Complex (extensive) Patient / Family Education for ongoing care ?X- 1 10 ?Staff obtains Consents, Records, Test Results / Process Orders ?[]  - 0 ?Staff telephones HHA, Nursing Homes / Clarify orders / etc ?[]  - 0 ?Routine Transfer to another Facility (non-emergent condition) ?[]  - 0 ?Routine Hospital Admission (non-emergent condition) ?X- 1 15 ?New Admissions / / Ordering NPWT, Apligraf, etc. ?[]  - 0 ?Emergency Hospital Admission (emergent condition) ?PROCESS - Special Needs ?[]  - Pediatric / Minor Patient Management 0 ?[]  - 0 ?Isolation Patient Management ?[]  - 0 ?Hearing / Language / Visual special needs ?[]  - 0 ?Assessment of Community assistance (transportation, D/C planning, etc.) ?[]  - 0 ?Additional assistance / Altered mentation ?[]  - 0 ?Support Surface(s) Assessment (bed, cushion, seat, etc.) ?INTERVENTIONS - Miscellaneous ?[]  - External ear exam 0 ?[]  - 0 ?Patient Transfer (multiple staff / / Similar  devices) ?[]  - 0 ?Simple Staple / Suture removal (25 or less) ?[]  - 0 ?Complex Staple / Suture removal (26 or more) ?[]  - 0 ?Hypo/Hyperglycemic Management (do not check if billed separately) ?X- 1 15 ?Ankle / Brachial Index (ABI) - do not check if billed separately ?Has the patient been seen  at the hospital within the last three years: Yes ?Total Score: 100 ?Level Of Care: New/Established - Level ?3 ?[name], [name] ([id number]) ?Electronic Signature(s) ?Signed: 03/19/2022 4:59:50 PM By: Elliot Gurney, BSN, RN, CWS, Kim RN, BSN ?Entered By: Elliot Gurney, BSN, RN, CWS, [name] on 03/19/2022 14:53:25 ?[name], [name] ([id number]) ?-------------------------------------------------------------------------------- ?Encounter Discharge Information Details ?Patient Name: [name], [name] ?Date of Service: 03/19/2022 1:30 PM ?Medical Record Number: [id number] ?Patient Account Number: [id number] ?Date of Birth/Sex: 26-Sep-1925 (88 y.o. Female) ?Treating RN: [name] ?Primary Care Ritu Gagliardo: [name] Other Clinician: ?Referring Zemira Zehring: [name] ?Treating Antonios Ostrow/Extender: [name] ?Weeks in Treatment: 0 ?Encounter Discharge Information Items Post Procedure Vitals ?Discharge Condition: Stable ?Temperature (?F): 97.8 ?Ambulatory Status: [name] ?Pulse (bpm): 75 ?Discharge Destination: Home ?Respiratory Rate (breaths/min): 16 ?Transportation: Private Auto ?Blood Pressure (mmHg): 139/84 ?Accompanied By: daughter, Eber Jones ?Schedule Follow-up Appointment: Yes ?Clinical Summary of Care: ?Electronic Signature(s) ?Signed: 03/19/2022 4:28:13 PM By: Elliot Gurney, BSN, RN, CWS, Kim RN, BSN ?Entered By: Elliot Gurney, BSN, RN, CWS, [name] on 03/19/2022 16:28:13 ?[name], [name] ([id number]) ?-------------------------------------------------------------------------------- ?Lower Extremity Assessment Details ?Patient Name: [name], [name] ?Date of Service: 03/19/2022 1:30 PM ?Medical Record Number: [id number] ?Patient Account Number: [id number] ?Date of Birth/Sex: 12/15/1924 (88 y.o.  Female) ?Treating RN: [name] ?Primary Care Endre Coutts: [name] Other Clinician: ?Referring Anzlee Hinesley: [name] ?Treating Brindle Leyba/Extender: [name] ?Weeks in Treatment: 0 ?Edema Assessment ?Assessed: [Left: Yes] [Right: No] ?Edema: [Left: Ye] [Right: s] ?Calf ?Left: Right: ?Point of Measurement: 30 cm From Medial Instep 33.4 cm ?Ankle ?Left: Right: ?Point of Measurement: 10 cm From Medial Instep 25.5 cm ?Vascular Assessment ?Pulses: ?Dorsalis Pedis ?Palpable: [Left:Yes] ?Doppler Audible: [Left:Yes] ?Posterior Tibial ?Palpable: [Left:Yes Yes] ?Notes ?Non-compressible >220, Patient has +3 pitting edema on left leg ?Electronic Signature(s) ?Signed: 03/19/2022 4:59:50 PM By: Elliot Gurney, BSN, RN, CWS, Kim RN, BSN ?Entered By: Elliot Gurney, BSN, RN, CWS, [name] on 03/19/2022 14:10:12 ?[place], [name] ([id number]) ?-------------------------------------------------------------------------------- ?Multi Wound Chart Details ?Patient Name: [name], [name] ?Date of Service: 03/19/2022 1:30 PM ?Medical Record Number: [id number] ?Patient Account Number: [id number] ?Date of Birth/Sex: Aug 11, 1925 (88 y.o. Female) ?Treating RN: [name] ?Primary Care Jonathon Castelo: [name] Other Clinician: ?Referring Zion Lint: [name] ?Treating Starling Jessie/Extender: [name] ?Weeks in Treatment: 0 ?Vital Signs ?Height(in): 59 ?Pulse(bpm): 75 ?Weight(lbs): 107 ?Blood Pressure(mmHg): 139/84 ?Body Mass Index(BMI): 21.6 ?Temperature(??F): 97.8 ?Respiratory Rate(breaths/min): 16 ?Photos: [1:No Photos] [N/A:N/A] ?Wound Location: [1:Left, Posterior Lower Leg] [N/A:N/A] ?Wounding Event: [1:Trauma] [N/A:N/A] ?Primary Etiology: [1:Trauma, Other] [N/A:N/A] ?Comorbid History: [1:Congestive Heart Failure, Hypertension] [N/A:N/A] ?Date Acquired: [1:01/18/2022] [N/A:N/A] ?Weeks of Treatment: [1:0] [N/A:N/A] ?Wound Status: [1:Open] [N/A:N/A] ?Wound Recurrence: [1:No] [N/A:N/A] ?Measurements L x W x D (cm) [1:4.5x3.5x3.1] [N/A:N/A] ?Area (cm?) : [1:12.37]  [N/A:N/A] ?Volume (cm?) : [1:38.347] [N/A:N/A] ?Starting Position 1 (o'clock): 10 ?Ending Position 1 (o'clock): [1:11] ?Maximum Distance 1 (cm): [1:3.1] ?Undermining: [1:Yes] [N/A:N/A] ?Classification: [1:Full Thickness

## 2022-03-22 ENCOUNTER — Encounter: Payer: Self-pay | Admitting: Cardiology

## 2022-03-22 ENCOUNTER — Ambulatory Visit: Payer: Medicare PPO | Admitting: Cardiology

## 2022-03-22 VITALS — BP 120/64 | HR 63 | Ht 59.0 in | Wt 107.5 lb

## 2022-03-22 DIAGNOSIS — Z950 Presence of cardiac pacemaker: Secondary | ICD-10-CM | POA: Diagnosis not present

## 2022-03-22 DIAGNOSIS — I5030 Unspecified diastolic (congestive) heart failure: Secondary | ICD-10-CM | POA: Diagnosis not present

## 2022-03-22 DIAGNOSIS — I10 Essential (primary) hypertension: Secondary | ICD-10-CM

## 2022-03-22 DIAGNOSIS — I4821 Permanent atrial fibrillation: Secondary | ICD-10-CM | POA: Diagnosis not present

## 2022-03-22 NOTE — Progress Notes (Signed)
?Cardiology Office Note:   ? ?Date:  03/22/2022  ? ?ID:  [name], DOB 1925-09-25, MRN [id number] ? ?PCP:  Cousart, Adalberto Ill, NP ?  ?[facility] Providers ?Cardiologist:  None    ? ?Referring MD: Kennieth Francois, *  ? ?Chief Complaint  ?Patient presents with  ? Other  ?  Est. Care c/o edema ankles. Pt hx Dr. Gwendolyn Lima in [place] seen about 6 months ago. Meds reviewed verbally with pt.  ? ? ?History of Present Illness:   ? ?[name] is a 88 y.o. female with a hx of hypertension, permanent atrial fibrillation, HFpEF, sinus pauses s/p PPM (2007,2015-[employer]) who presents to establish care. ? ?Patient was evaluated in the ED on 02/03/2022 due to shortness of breath, edema and cough.  Initially was going to urgent care when she was struck by a car door in the parking lot in her left posterior calf.  Dad took patient to ED due to significant bleeding.  Received some stitches.  Chest x-ray in the ED showed increased interstitial markings consistent with mild CHF.  BNP was elevated at 640.  Initially was on Lasix 20 mg daily, this was increased to 40 mg daily.  Followed up at the heart failure clinic where metolazone was given x2 days.  Edema improved, Lasix switched to torsemide 40 mg daily.  Edema has improved since taking torsemide, admits to adequate diuresis. ? ?Echocardiogram 02/27/2019 2021 showed normal systolic function EF 60 to 65%, indeterminate diastolic function.  EKG 02/03/2022 showed atrial fibrillation with occasional ventricular paced rhythm. ? ?Patient has a history of sinus pause, has chronic atrial fibrillation for over 10 to 15 years now.  Not on anticoagulation due to risk of falls secondary to macular degeneration and wobbly gait. ? ?Past Medical History:  ?Diagnosis Date  ? Arrhythmia   ? atrial fibrillation  ? Congestive heart failure (CHF) (HCC)   ? Pacemaker 2015  ? ? ?History reviewed. No pertinent surgical history. ? ?Current Medications: ?Current Meds  ?Medication Sig  ?  amLODipine (NORVASC) 5 MG tablet Take 5 mg by mouth daily.  ? atorvastatin (LIPITOR) 10 MG tablet Take 10 mg by mouth daily.  ? calcium carbonate (OS-CAL - DOSED IN MG OF ELEMENTAL CALCIUM) 1250 (500 Ca) MG tablet Take 1 tablet by mouth 2 (two) times daily with a meal.  ? dipyridamole-aspirin (AGGRENOX) 200-25 MG 12hr capsule Take 1 capsule by mouth 2 (two) times daily.  ? empagliflozin (JARDIANCE) 10 MG TABS tablet Take 1 tablet (10 mg total) by mouth daily before breakfast.  ? losartan (COZAAR) 50 MG tablet Take 50 mg by mouth 2 (two) times daily.  ? metoprolol (TOPROL-XL) 200 MG 24 hr tablet Take 200 mg by mouth daily.  ? Multiple Vitamins-Minerals (PRESERVISION AREDS 2 PO) Take 1 tablet by mouth in the morning and at bedtime.  ? multivitamin-iron-minerals-folic acid (CENTRUM) chewable tablet Chew 1 tablet by mouth daily.  ? Omega-3 Fatty Acids (FISH OIL) 1000 MG CAPS Take 1 capsule by mouth daily.  ? potassium chloride (KLOR-CON) 10 MEQ tablet Take 10 mEq by mouth as needed. Only if she takes an additional dose of lasix  ? torsemide (DEMADEX) 20 MG tablet Take 2 tablets (40 mg total) by mouth daily. And additional 20mg  if needed  ?  ? ?Allergies:   Moxifloxacin, Tobramycin, and Codeine  ? ?Social History  ? ?Socioeconomic History  ? Marital status: Widowed  ?  Spouse name: Not on file  ? Number of children: Not  on file  ? Years of education: Not on file  ? Highest education level: Not on file  ?Occupational History  ? Not on file  ?Tobacco Use  ? Smoking status: Never  ? Smokeless tobacco: Never  ?Substance and Sexual Activity  ? Alcohol use: Not on file  ? Drug use: Not on file  ? Sexual activity: Not on file  ?Other Topics Concern  ? Not on file  ?Social History Narrative  ? Not on file  ? ?Social Determinants of Health  ? ?Financial Resource Strain: Not on file  ?Food Insecurity: Not on file  ?Transportation Needs: Not on file  ?Physical Activity: Not on file  ?Stress: Not on file  ?Social Connections: Not on  file  ?  ? ?Family History: ?The patient's family history includes Heart attack in her daughter and father; Heart failure in her mother. ? ?ROS:   ?Please see the history of present illness.    ? All other systems reviewed and are negative. ? ?EKGs/Labs/Other Studies Reviewed:   ? ?The following studies were reviewed today: ? ? ?EKG:  EKG is  ordered today.  The ekg ordered today demonstrates atrial fibrillation, paced rhythm ? ?Recent Labs: ?02/03/2022: B Natriuretic Peptide 648.1; Hemoglobin 10.7; Platelets 198 ?03/07/2022: BUN 36; Creatinine, Ser 0.88; Potassium 3.6; Sodium 135  ?Recent Lipid Panel ?No results found for: CHOL, TRIG, HDL, CHOLHDL, VLDL, LDLCALC, LDLDIRECT ? ? ?Risk Assessment/Calculations:   ? ? ?    ? ?Physical Exam:   ? ?VS:  BP 120/64 (BP Location: Right Arm, Patient Position: Sitting, Cuff Size: Normal)   Pulse 63   Ht 4\' 11"  (1.499 m)   Wt 107 lb 8 oz (48.8 kg)   LMP  (LMP Unknown)   SpO2 93%   BMI 21.71 kg/m?    ? ?Wt Readings from Last 3 Encounters:  ?03/22/22 107 lb 8 oz (48.8 kg)  ?03/07/22 111 lb 6 oz (50.5 kg)  ?02/13/22 115 lb (52.2 kg)  ?  ? ?GEN:  Well nourished, well developed in no acute distress ?HEENT: Normal ?NECK: No JVD; No carotid bruits ?LYMPHATICS: No lymphadenopathy ?CARDIAC: Irregular rhythm, ?RESPIRATORY:  Clear to auscultation without rales or wheezing ?ABDOMEN: Soft, non-tender, non-distended ?MUSCULOSKELETAL: 1+ edema edema; varicose veins noted ?SKIN: Warm and dry ?NEUROLOGIC:  Alert and oriented x 3 ?PSYCHIATRIC:  Normal affect  ? ?ASSESSMENT:   ? ?1. Permanent atrial fibrillation (HCC)   ?2. Pacemaker   ?3. Heart failure with preserved ejection fraction, unspecified HF chronicity (HCC)   ?4. Primary hypertension   ? ?PLAN:   ? ?In order of problems listed above: ? ?Permanent A-fib, not on anticoagulation due to risk of falls.  Heart rate controlled.  Continue Toprol-XL.  Echo with EF 60 to 65%. ?S/p pacemaker for apparent sinus pauses.  Establish care with  device clinic. ?HFpEF, 1+ edema, edema improving.  Continue torsemide 40 mg daily.  Last BMP with normal creatinine. ?Hypertension, BP controlled.  Continue Toprol-XL, losartan. ? ?Follow-up in 6 months. ? ?   ? ? ?Medication Adjustments/Labs and Tests Ordered: ?Current medicines are reviewed at length with the patient today.  Concerns regarding medicines are outlined above.  ?Orders Placed This Encounter  ?Procedures  ? EKG 12-Lead  ? ?No orders of the defined types were placed in this encounter. ? ? ?Patient Instructions  ?Medication Instructions:  ? ?Your physician recommends that you continue on your current medications as directed. Please refer to the Current Medication list given to you today. ? ?*  If you need a refill on your cardiac medications before your next appointment, please call your pharmacy* ? ? ?Lab Work: ?None ordered ?If you have labs (blood work) drawn today and your tests are completely normal, you will receive your results only by: ?MyChart Message (if you have MyChart) OR ?A paper copy in the mail ?If you have any lab test that is abnormal or we need to change your treatment, we will call you to review the results. ? ? ?Testing/Procedures: ?None ordered ? ? ?Follow-Up: ?At [facility], you and your health needs are our priority.  As part of our continuing mission to provide you with exceptional heart care, we have created designated Provider Care Teams.  These Care Teams include your primary Cardiologist (physician) and Advanced Practice Providers (APPs -  Physician Assistants and Nurse Practitioners) who all work together to provide you with the care you need, when you need it. ? ?We recommend signing up for the patient portal called "MyChart".  Sign up information is provided on this After Visit Summary.  MyChart is used to connect with patients for Virtual Visits (Telemedicine).  Patients are able to view lab/test results, encounter notes, upcoming appointments, etc.  Non-urgent messages  can be sent to your provider as well.   ?To learn more about what you can do with MyChart, go to [email or web address].   ? ?Your next appointment:   ?6 month(s) ? ?The format for your next appointment:   ?In P

## 2022-03-22 NOTE — Patient Instructions (Signed)
Medication Instructions:  ? ?Your physician recommends that you continue on your current medications as directed. Please refer to the Current Medication list given to you today. ? ?*If you need a refill on your cardiac medications before your next appointment, please call your pharmacy* ? ? ?Lab Work: ?None ordered ?If you have labs (blood work) drawn today and your tests are completely normal, you will receive your results only by: ?MyChart Message (if you have MyChart) OR ?A paper copy in the mail ?If you have any lab test that is abnormal or we need to change your treatment, we will call you to review the results. ? ? ?Testing/Procedures: ?None ordered ? ? ?Follow-Up: ?At [facility], you and your health needs are our priority.  As part of our continuing mission to provide you with exceptional heart care, we have created designated Provider Care Teams.  These Care Teams include your primary Cardiologist (physician) and Advanced Practice Providers (APPs -  Physician Assistants and Nurse Practitioners) who all work together to provide you with the care you need, when you need it. ? ?We recommend signing up for the patient portal called "MyChart".  Sign up information is provided on this After Visit Summary.  MyChart is used to connect with patients for Virtual Visits (Telemedicine).  Patients are able to view lab/test results, encounter notes, upcoming appointments, etc.  Non-urgent messages can be sent to your provider as well.   ?To learn more about what you can do with MyChart, go to [email or web address].   ? ?Your next appointment:   ?6 month(s) ? ?The format for your next appointment:   ?In Person ? ?Provider:   ?You may see Debbe Odea, MD or one of the following Advanced Practice Providers on your designated Care Team:   ?Nicolasa Ducking, NP ?Eula Listen, PA-C ?Cadence Fransico Michael, PA-C  ? ? ?Other Instructions ? ?New patient EP appointment to establish device management of pace maker. ? ?Important  Information About Sugar ? ? ? ? ? ? ?

## 2022-03-24 ENCOUNTER — Telehealth: Payer: Self-pay | Admitting: Student

## 2022-03-24 NOTE — Telephone Encounter (Signed)
? ?  Daughter called with request for refill of Torsemide. Called and spoke with daughter.  She states patient only has 1 tablet of Torsemide left.  She takes Torsemide 40 mg daily with an extra 20 mg as needed.  However, daughter states she does not ever need the extra dose.  She states that pharmacy said she could not pick up a refill of Torsemide until Thursday.  Called and spoke with pharmacy and they state it looks like patient picked up a 90-day prescription on 02/14/2022 so she cannot get anything filled yet.  However, daughter is clear that they did not pick up a 90-day prescription.  Pharmacy is only able to fill a 30-day prescription today.  Notified daughter and she was thankful for the help. ? ?Corrin Parker, PA-C ?03/24/2022 2:17 PM ? ? ?

## 2022-03-25 ENCOUNTER — Encounter: Payer: Medicare PPO | Admitting: Physician Assistant

## 2022-03-25 DIAGNOSIS — I87312 Chronic venous hypertension (idiopathic) with ulcer of left lower extremity: Secondary | ICD-10-CM | POA: Diagnosis not present

## 2022-03-25 NOTE — Progress Notes (Addendum)
[name] ([id number]) ?Visit Report for 03/25/2022 ?Chief Complaint Document Details ?Patient Name: [name], [name] ?Date of Service: 03/25/2022 11:45 AM ?Medical Record Number: [id number] ?Patient Account Number: [id number] ?Date of Birth/Sex: 1925-08-03 (88 y.o. F) ?Treating RN: Huel Coventry ?Primary Care Provider: Doran Stabler Other Clinician: ?Referring Provider: Doran Stabler ?Treating Provider/Extender: Allen Derry ?Weeks in Treatment: 0 ?Information Obtained from: Patient ?Chief Complaint ?Left LE ulcer ?Electronic Signature(s) ?Signed: 03/25/2022 12:02:48 PM By: Lenda Kelp PA-C ?Entered By: Lenda Kelp on 03/25/2022 12:02:48 ?[place], Marylouise [name] ([id number]) ?-------------------------------------------------------------------------------- ?Debridement Details ?Patient Name: [name], [name] ?Date of Service: 03/25/2022 11:45 AM ?Medical Record Number: [id number] ?Patient Account Number: [id number] ?Date of Birth/Sex: Feb 16, 1925 (88 y.o. F) ?Treating RN: Huel Coventry ?Primary Care Provider: Doran Stabler Other Clinician: ?Referring Provider: Doran Stabler ?Treating Provider/Extender: Allen Derry ?Weeks in Treatment: 0 ?Debridement Performed for ?Wound #1 Left,Posterior Lower Leg ?Assessment: ?Performed By: Physician Nelida Meuse., PA-C ?Debridement Type: Debridement ?Level of Consciousness (Pre- ?Awake and Alert ?procedure): ?Pre-procedure Verification/Time Out ?Yes - 12:06 ?Taken: ?Start Time: 12:06 ?Pain Control: Lidocaine ?Total Area Debrided (L x W): 1.5 (cm) x 3 (cm) = 4.5 (cm?) ?Tissue and other material ?Viable, Non-Viable, Eschar, Slough, Subcutaneous, Fibrin/Exudate, Slough ?debrided: ?Level: Skin/Subcutaneous Tissue ?Debridement Description: Excisional ?Instrument: Curette ?Bleeding: Minimum ?Hemostasis Achieved: Pressure ?Response to Treatment: Procedure was tolerated well ?Level of Consciousness (Post- ?Awake and Alert ?procedure): ?Post Debridement Measurements of Total Wound ?Length: (cm)  3 ?Width: (cm) 4 ?Depth: (cm) 1.2 ?Volume: (cm?) 11.31 ?Character of Wound/Ulcer Post Debridement: Stable ?Post Procedure Diagnosis ?Same as Pre-procedure ?Electronic Signature(s) ?Signed: 03/25/2022 12:22:32 PM By: Elliot Gurney, BSN, RN, CWS, Kim RN, BSN ?Signed: 03/25/2022 4:40:44 PM By: Lenda Kelp PA-C ?Entered By: Elliot Gurney, BSN, RN, CWS, Kim on 03/25/2022 12:07:44 ?[place], Marylouise [name] ([id number]) ?-------------------------------------------------------------------------------- ?HPI Details ?Patient Name: [name], [name] ?Date of Service: 03/25/2022 11:45 AM ?Medical Record Number: [id number] ?Patient Account Number: [id number] ?Date of Birth/Sex: [birth year] (88 y.o. F) ?Treating RN: Huel Coventry ?Primary Care Provider: Doran Stabler Other Clinician: ?Referring Provider: Doran Stabler ?Treating Provider/Extender: Allen Derry ?Weeks in Treatment: 0 ?History of Present Illness ?HPI Description: 03-19-2022 upon evaluation today patient appears to be doing poorly in regard to the left posterior lower extremity ulcer. This ?is something that she actually struck on a car door getting out of the car and this was around January 18, 2022. She was going to urgent care ?due to her leg swelling when this occurred. Since that time she has had a tremendous amount of bleeding she also did have recommendations for ?medicated gauze to be used by urgent care. Initially she actually apparently had sutures placed over this area but then when the sutures were ?removed this dehisced to some degree. She was placed on Omnicef in the past there is no signs of infection right now but again that is definitely ?something we will need to keep a close eye on especially with the depth of the wound. The good news that she does not seem to be having any ?pain which is excellent. And again right now they have been using medicated gauze packing into the wound. She did have an x-ray on February ?26, 2023 which was negative for any signs of bone abnormality which  is great news. Patient does have a history of chronic venous ?insufficiency/hypertension with the lower extremity ulcer now at this point. She also has congestive heart failure, hypertension, she is on long- ?term anticoagulant therapy which is Aggrenox due to a pacemaker. ?03-25-2022 upon evaluation today patient appears to be  doing better in regard to the wound on her leg. She has been tolerating the dressing ?changes without complication. Fortunately I do not see any signs of active infection locally or systemically at this time which is great news. ?Electronic Signature(s) ?Signed: 03/25/2022 12:59:53 PM By: Lenda Kelp PA-C ?Entered By: Lenda Kelp on 03/25/2022 12:59:53 ?[place], Marylouise [name] ([id number]) ?-------------------------------------------------------------------------------- ?Physical Exam Details ?Patient Name: [name], [name] ?Date of Service: 03/25/2022 11:45 AM ?Medical Record Number: [id number] ?Patient Account Number: [id number] ?Date of Birth/Sex: 1925/11/17 (88 y.o. F) ?Treating RN: Huel Coventry ?Primary Care Provider: Doran Stabler Other Clinician: ?Referring Provider: Doran Stabler ?Treating Provider/Extender: Allen Derry ?Weeks in Treatment: 0 ?Constitutional ?Well-nourished and well-hydrated in no acute distress. ?Respiratory ?normal breathing without difficulty. ?Psychiatric ?this patient is able to make decisions and demonstrates good insight into disease process. Alert and Oriented x 3. pleasant and cooperative. ?Notes ?Patient's wound bed showed evidence of much better granulation and epithelization there was some need for sharp debridement I removed slough ?and biofilm down to good subcutaneous tissue both external and internal. She tolerated the debridement without pain postdebridement the wound ?bed appears to be doing much better. ?Electronic Signature(s) ?Signed: 03/25/2022 1:00:10 PM By: Lenda Kelp PA-C ?Entered By: Lenda Kelp on 03/25/2022 13:00:10 ?[place], Marylouise [name]  ([id number]) ?-------------------------------------------------------------------------------- ?Physician Orders Details ?Patient Name: [name], [name] ?Date of Service: 03/25/2022 11:45 AM ?Medical Record Number: [id number] ?Patient Account Number: [id number] ?Date of Birth/Sex: [birth year] (88 y.o. 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 ?Home Health ?o Home Health Company: [facility] ?o ADMIT to Home Health for wound care. May utilize formulary equivalent dressing for wound treatment orders unless ?otherwise specified. Home Health Nurse may visit PRN to address patientos wound care needs. ?o Scheduled days for dressing changes to be completed; exception, patient has scheduled wound care visit that day. - Three ?times a week. Once in clinic and twice by homehealth. ?o **Please direct any NON-WOUND related issues/requests for orders to patient's Primary Care Physician. **If current ?dressing causes regression in wound condition, may D/C ordered dressing product/s and apply Normal Saline Moist ?Dressing daily until next Wound Healing Center or Other MD appointment. **Notify Wound Healing Center of regression in ?wound condition at [phone]. ?Bathing/ Shower/ Hygiene ?o May  shower; gently cleanse wound with antibacterial soap, rinse and pat dry prior to dressing wounds ?Anesthetic (Use 'Patient Medications' Section for Anesthetic Order Entry) ?o Lidocaine applied to wound bed ?Edema Cont

## 2022-03-25 NOTE — Progress Notes (Signed)
[place], [name] ([id number]) ?Visit Report for 03/25/2022 ?Arrival Information Details ?Patient Name: [name], [name] ?Date of Service: 03/25/2022 11:45 AM ?Medical Record Number: [id number] ?Patient Account Number: [id number] ?Date of Birth/Sex: 06/26/1925 (88 y.o. F) ?Treating RN: Huel Coventry ?Primary Care Barbette Mcglaun: Doran Stabler Other Clinician: ?Referring Maidie Streight: Doran Stabler ?Treating Rosevelt Luu/Extender: Allen Derry ?Weeks in Treatment: 0 ?Visit Information History Since Last Visit ?Added or deleted any medications: No ?Patient Arrived: [name] ?Has Dressing in Place as Prescribed: Yes ?Arrival Time: 11:44 ?Has Compression in Place as Prescribed: Yes ?Accompanied By: daughter ?Pain Present Now: No ?Transfer Assistance: None ?Patient Identification Verified: Yes ?Secondary Verification Process Completed: Yes ?Patient Has Alerts: Yes ?Patient Alerts: Patient on Blood Thinner ?Aggrenox ?NOT Diabetic ?03/19/22 ?Non-compressible >220 ?Legally Blind.Marland KitchenMarland KitchenPatient ?follows you down hallway ?Electronic Signature(s) ?Signed: 03/25/2022 12:22:32 PM By: Elliot Gurney, BSN, RN, CWS, Kim RN, BSN ?Entered By: Elliot Gurney, BSN, RN, CWS, Kim on 03/25/2022 11:45:33 ?[place], [name] ([id number]) ?-------------------------------------------------------------------------------- ?Encounter Discharge Information Details ?Patient Name: [name], [name] ?Date of Service: 03/25/2022 11:45 AM ?Medical Record Number: [id number] ?Patient Account Number: [id number] ?Date of Birth/Sex: 1925-01-28 (88 y.o. F) ?Treating RN: Huel Coventry ?Primary Care Lorann Tani: Doran Stabler Other Clinician: ?Referring Nirvan Laban: Doran Stabler ?Treating Alesana Magistro/Extender: Allen Derry ?Weeks in Treatment: 0 ?Encounter Discharge Information Items Post Procedure Vitals ?Discharge Condition: Stable ?Temperature (?F): 97.8 ?Ambulatory Status: Ambulatory ?Pulse (bpm): 84 ?Discharge Destination: Home Health ?Respiratory Rate (breaths/min): 18 ?Telephoned: No ?Blood Pressure (mmHg): 139/84 ?Orders  Sent: Yes ?Transportation: Private Auto ?Accompanied By: daughter ?Schedule Follow-up Appointment: Yes ?Clinical Summary of Care: ?Notes ?Sent signed copy of orders with patient and faxed to [facility] as well. ?Electronic Signature(s) ?Signed: 03/25/2022 12:22:32 PM By: Elliot Gurney, BSN, RN, CWS, Kim RN, BSN ?Entered By: Elliot Gurney, BSN, RN, CWS, Kim on 03/25/2022 12:21:44 ?[place], [name] ([id number]) ?-------------------------------------------------------------------------------- ?Lower Extremity Assessment Details ?Patient Name: [name], [name] ?Date of Service: 03/25/2022 11:45 AM ?Medical Record Number: [id number] ?Patient Account Number: [id number] ?Date of Birth/Sex: [birth year] (88 y.o. F) ?Treating RN: Huel Coventry ?Primary Care Jamol Ginyard: Doran Stabler Other Clinician: ?Referring Akeria Hedstrom: Doran Stabler ?Treating Gicela Schwarting/Extender: Allen Derry ?Weeks in Treatment: 0 ?Edema Assessment ?Assessed: [Left: No] [Right: No] ?[Left: Edema] [Right: :] ?Calf ?Left: Right: ?Point of Measurement: 30 cm From Medial Instep 36.5 cm ?Ankle ?Left: Right: ?Point of Measurement: 10 cm From Medial Instep 23 cm ?Vascular Assessment ?Pulses: ?Dorsalis Pedis ?Palpable: [Left:Yes] ?Electronic Signature(s) ?Signed: 03/25/2022 12:22:32 PM By: Elliot Gurney, BSN, RN, CWS, Kim RN, BSN ?Entered By: Elliot Gurney, BSN, RN, CWS, Kim on 03/25/2022 11:57:04 ?[place], [name] ([id number]) ?-------------------------------------------------------------------------------- ?Multi Wound Chart Details ?Patient Name: [name], [name] ?Date of Service: 03/25/2022 11:45 AM ?Medical Record Number: [id number] ?Patient Account Number: [id number] ?Date of Birth/Sex: July 15, 1925 (88 y.o. F) ?Treating RN: Huel Coventry ?Primary Care Duaine Radin: Doran Stabler Other Clinician: ?Referring Christofer Shen: Doran Stabler ?Treating Evelynne Spiers/Extender: Allen Derry ?Weeks in Treatment: 0 ?Vital Signs ?Height(in): 59 ?Pulse(bpm): 84 ?Weight(lbs): 107 ?Blood Pressure(mmHg): 151/81 ?Body Mass Index(BMI):  21.6 ?Temperature(??F): 97.8 ?Respiratory Rate(breaths/min): 16 ?Photos: [1:No Photos] [N/A:N/A] ?Wound Location: [1:Left, Posterior Lower Leg] [N/A:N/A] ?Wounding Event: [1:Trauma] [N/A:N/A] ?Primary Etiology: [1:Trauma, Other] [N/A:N/A] ?Comorbid History: [1:Congestive Heart Failure, Hypertension] [N/A:N/A] ?Date Acquired: [1:01/18/2022] [N/A:N/A] ?Weeks of Treatment: [1:0] [N/A:N/A] ?Wound Status: [1:Open] [N/A:N/A] ?Wound Recurrence: [1:No] [N/A:N/A] ?Measurements L x W x D (cm) [1:3x4x1.2] [N/A:N/A] ?Area (cm?) : [1:9.425] [N/A:N/A] ?Volume (cm?) : [1:11.31] [N/A:N/A] ?% Reduction in Area: [1:23.80%] [N/A:N/A] ?% Reduction in Volume: [1:70.50%] [N/A:N/A] ?Classification: [1:Full Thickness Without Exposed Support Structures] [N/A:N/A] ?Exudate Amount: [1:Medium] [N/A:N/A] ?Exudate Type: [1:Serous] [N/A:N/A] ?Exudate Color: [1:amber] [N/A:N/A] ?Granulation Amount: [1:Medium (34-66%)] [  N/A:N/A] ?Granulation Quality: [1:Red] [N/A:N/A] ?Necrotic Amount: [1:Medium (34-66%)] [N/A:N/A] ?Necrotic Tissue: [1:Eschar, Adherent Slough] [N/A:N/A] ?Exposed Structures: ?[1:Fat Layer (Subcutaneous Tissue): Yes Fascia: No Tendon: No Muscle: No Joint: No Bone: No None] [N/A:N/A N/A] ?Treatment Notes ?Electronic Signature(s) ?Signed: 03/25/2022 12:22:32 PM By: Elliot Gurney, BSN, RN, CWS, Kim RN, BSN ?Entered By: Elliot Gurney, BSN, RN, CWS, Kim on 03/25/2022 12:04:16 ?[place], [name] ([id number]) ?-------------------------------------------------------------------------------- ?Multi-Disciplinary Care Plan Details ?Patient Name: [name], [name] ?Date of Service: 03/25/2022 11:45 AM ?Medical Record Number: [id number] ?Patient Account Number: [id number] ?Date of Birth/Sex: 05/28/1925 (88 y.o. F) ?Treating RN: Huel Coventry ?Primary Care Kaylea Mounsey: Doran Stabler Other Clinician: ?Referring Rivkah Wolz: Doran Stabler ?Treating Citlally Captain/Extender: Allen Derry ?Weeks in Treatment: 0 ?Active Inactive ?Abuse / Safety / Falls / Self Care Management ?Nursing  Diagnoses: ?Abuse or neglect; actual or potential ?History of Falls ?Goals: ?Patient will not experience any injury related to falls ?Date Initiated: 03/19/2022 ?Target Resolution Date: 03/19/2022 ?Goal Status: Active ?Patient will remain injury free related to falls ?Date Initiated: 03/19/2022 ?Target Resolution Date: 03/19/2022 ?Goal Status: Active ?Patient/caregiver will verbalize understanding of skin care regimen ?Date Initiated: 03/19/2022 ?Target Resolution Date: 03/19/2022 ?Goal Status: Active ?Interventions: ?Call light and/or bell within patient's reach ?Podiatry chair, stretcher in low position and side rails up as needed ?Notes: ?Necrotic Tissue ?Nursing Diagnoses: ?Impaired tissue integrity related to necrotic/devitalized tissue ?Knowledge deficit related to management of necrotic/devitalized tissue ?Goals: ?Necrotic/devitalized tissue will be minimized in the wound bed ?Date Initiated: 03/19/2022 ?Target Resolution Date: 03/19/2022 ?Goal Status: Active ?Patient/caregiver will verbalize understanding of reason and process for debridement of necrotic tissue ?Date Initiated: 03/19/2022 ?Target Resolution Date: 03/19/2022 ?Goal Status: Active ?Interventions: ?Assess patient pain level pre-, during and post procedure and prior to discharge ?Provide education on necrotic tissue and debridement process ?Treatment Activities: ?Apply topical anesthetic as ordered : 03/19/2022 ?Excisional debridement : 03/19/2022 ?Notes: ?Orientation to the Wound Care Program ?Nursing Diagnoses: ?Knowledge deficit related to the wound healing center program ?Goals: ?Patient/caregiver will verbalize understanding of the Wound Healing Center Program ?[place], [name] ([id number]) ?Date Initiated: 03/19/2022 ?Target Resolution Date: 03/19/2022 ?Goal Status: Active ?Interventions: ?Provide education on orientation to the wound center ?Notes: ?Pain, Acute or Chronic ?Nursing Diagnoses: ?Pain Management - Non-cyclic Acute (Procedural) ?Goals: ?Patient  will verbalize adequate pain control and receive pain control interventions during procedures as needed ?Date Initiated: 03/19/2022 ?Target Resolution Date: 03/19/2022 ?Goal Status: Active ?Patient/caregiver will verbalize adequat

## 2022-03-27 ENCOUNTER — Encounter: Payer: Self-pay | Admitting: Family Medicine

## 2022-03-27 ENCOUNTER — Ambulatory Visit (INDEPENDENT_AMBULATORY_CARE_PROVIDER_SITE_OTHER): Payer: Medicare PPO | Admitting: Family Medicine

## 2022-03-27 VITALS — BP 119/65 | HR 73 | Temp 97.5°F | Resp 16 | Ht 59.0 in | Wt 106.7 lb

## 2022-03-27 DIAGNOSIS — I10 Essential (primary) hypertension: Secondary | ICD-10-CM | POA: Diagnosis not present

## 2022-03-27 DIAGNOSIS — L602 Onychogryphosis: Secondary | ICD-10-CM | POA: Diagnosis not present

## 2022-03-27 DIAGNOSIS — I480 Paroxysmal atrial fibrillation: Secondary | ICD-10-CM

## 2022-03-27 DIAGNOSIS — I5043 Acute on chronic combined systolic (congestive) and diastolic (congestive) heart failure: Secondary | ICD-10-CM

## 2022-03-27 NOTE — Assessment & Plan Note (Signed)
Chronic, stable ?Some LE edema noted ?L>R s/p injury; f/b wound care with [facility]; M visits with wonuc care ?Continue 40mg  Torsemide with KCLOR if additional dose needed ?F/b cards ?

## 2022-03-27 NOTE — Assessment & Plan Note (Signed)
Chronic, stable ?F/b cards ?On aggrenox 200-25 BID ?Hx of PPM ?Denies fast, irregular heart beats at this time ?Some SOB with excessive exertion  ?

## 2022-03-27 NOTE — Assessment & Plan Note (Signed)
Chronic, per pt report ?Request referral to podiatry  ?

## 2022-03-27 NOTE — Assessment & Plan Note (Signed)
Chronic, stable ?Denies CP ?Denies SOB/ DOE ?Denies low blood pressure/hypotension ?Denies vision changes ?No LE Edema noted on exam ?Continue medication, Norvasc 5 mg; Losartan 50 mg; Metop 200 mg XR.  ?RTC 6 months for CPE ?Seek emergent care if you develop chest pain or chest pressure ? ?

## 2022-03-27 NOTE — Progress Notes (Signed)
? ? ?I,Sulibeya S Dimas,acting as a scribe for Jacky KindleElise T Berlynn Warsame, FNP.,have documented all relevant documentation on the behalf of Jacky Kindlelise T Tiasia Weberg, FNP,as directed by  Jacky KindleElise T Kmari Brian, FNP while in the presence of Jacky KindleElise T Shatera Rennert, FNP. ? ?New patient visit ? ? ?Patient: [name]   DOB: 05/23/1925   88 y.o. Female  MRN: [id number] ?Visit Date: 03/27/2022 ? ?Today's healthcare provider: Jacky KindleElise T Alasha Mcguinness, FNP  ? ?Patient presents for new patient visit to establish care.  Introduced to Publishing rights managernurse practitioner role and practice setting.  All questions answered.  Discussed provider/patient relationship and expectations. ? ?Chief Complaint  ?Patient presents with  ? New Patient (Initial Visit)  ? ?Subjective  ?  ?[name] is a 88 y.o. female who presents today as a new patient to establish care.  ?HPI  ?[place] ? ?Past Medical History:  ?Diagnosis Date  ? Arrhythmia   ? atrial fibrillation  ? Congestive heart failure (CHF) (HCC)   ? Hypertension   ? Pacemaker 2015  ? ?Past Surgical History:  ?Procedure Laterality Date  ? ABDOMINAL HYSTERECTOMY    ? EYE SURGERY    ? MOHS SURGERY  2023  ? PACEMAKER PLACEMENT N/A 2007  ? 2017  ? ?Family Status  ?Relation Name Status  ? Mother  Deceased  ? Father  Deceased  ? Daughter  Alive  ? ?Family History  ?Problem Relation Age of Onset  ? Heart failure Mother   ? Heart attack Father   ? Heart attack Daughter   ? ?Social History  ? ?Socioeconomic History  ? Marital status: Widowed  ?  Spouse name: Not on file  ? Number of children: Not on file  ? Years of education: Not on file  ? Highest education level: Not on file  ?Occupational History  ? Not on file  ?Tobacco Use  ? Smoking status: Never  ? Smokeless tobacco: Never  ?Substance and Sexual Activity  ? Alcohol use: Not Currently  ? Drug use: Never  ? Sexual activity: Not on file  ?Other Topics Concern  ? Not on file  ?Social History Narrative  ? Not on file  ? ?Social Determinants of Health  ? ?Financial Resource Strain:  Not on file  ?Food Insecurity: Not on file  ?Transportation Needs: Not on file  ?Physical Activity: Not on file  ?Stress: Not on file  ?Social Connections: Not on file  ? ?Outpatient Medications Prior to Visit  ?Medication Sig  ? amLODipine (NORVASC) 5 MG tablet Take 5 mg by mouth daily.  ? atorvastatin (LIPITOR) 10 MG tablet Take 10 mg by mouth daily.  ? calcium carbonate (OS-CAL - DOSED IN MG OF ELEMENTAL CALCIUM) 1250 (500 Ca) MG tablet Take 1 tablet by mouth 2 (two) times daily with a meal.  ? dipyridamole-aspirin (AGGRENOX) 200-25 MG 12hr capsule Take 1 capsule by mouth 2 (two) times daily.  ? empagliflozin (JARDIANCE) 10 MG TABS tablet Take 1 tablet (10 mg total) by mouth daily before breakfast.  ? losartan (COZAAR) 50 MG tablet Take 50 mg by mouth 2 (two) times daily.  ? metoprolol (TOPROL-XL) 200 MG 24 hr tablet Take 200 mg by mouth daily.  ? Multiple Vitamins-Minerals (PRESERVISION AREDS 2 PO) Take 1 tablet by mouth in the morning and at bedtime.  ? multivitamin-iron-minerals-folic acid (CENTRUM) chewable tablet Chew 1 tablet by mouth daily.  ? Omega-3 Fatty Acids (FISH OIL) 1000 MG CAPS Take 1 capsule by mouth daily.  ?  potassium chloride (KLOR-CON) 10 MEQ tablet Take 10 mEq by mouth as needed. Only if she takes an additional dose of lasix  ? torsemide (DEMADEX) 20 MG tablet Take 2 tablets (40 mg total) by mouth daily. And additional 20mg  if needed  ? ?No facility-administered medications prior to visit.  ? ?Allergies  ?Allergen Reactions  ? Moxifloxacin Itching and Swelling  ?  Swelling and itching of the eyes.  ? Tobramycin Itching and Swelling  ?  Swelling and itching of the eyelids.  ? Codeine   ?  Other reaction(s): Hallucination  ? ? ?There is no immunization history for the selected administration types on file for this patient. ? ?Health Maintenance  ?Topic Date Due  ? COVID-19 Vaccine (1) Never done  ? TETANUS/TDAP  Never done  ? Zoster Vaccines- Shingrix (1 of 2) Never done  ? Pneumonia Vaccine  39+ Years old (1 - PCV) Never done  ? DEXA SCAN  Never done  ? INFLUENZA VACCINE  07/09/2022  ? HPV VACCINES  Aged Out  ? ? ?Patient Care Team: ?09/08/2022, FNP as PCP - General (Family Medicine) ? ?Review of Systems  ?HENT:  Positive for congestion, hearing loss and rhinorrhea.   ?Eyes:  Positive for photophobia and itching.  ?Cardiovascular:  Positive for leg swelling.  ?Endocrine: Positive for cold intolerance.  ?Genitourinary:  Positive for urgency.  ?Skin:  Positive for wound.  ?Psychiatric/Behavioral:  Positive for confusion.   ? ?Last CBC ?Lab Results  ?Component Value Date  ? WBC 6.3 02/03/2022  ? HGB 10.7 (L) 02/03/2022  ? HCT 32.6 (L) 02/03/2022  ? MCV 100.0 02/03/2022  ? MCH 32.8 02/03/2022  ? RDW 14.3 02/03/2022  ? PLT 198 02/03/2022  ? ?Last metabolic panel ?Lab Results  ?Component Value Date  ? GLUCOSE 110 (H) 03/07/2022  ? NA 135 03/07/2022  ? K 3.6 03/07/2022  ? CL 95 (L) 03/07/2022  ? CO2 33 (H) 03/07/2022  ? BUN 36 (H) 03/07/2022  ? CREATININE 0.88 03/07/2022  ? GFRNONAA >60 03/07/2022  ? CALCIUM 9.0 03/07/2022  ? ANIONGAP 7 03/07/2022  ? ?  ? ? Objective  ?  ?BP 119/65 (BP Location: Right Arm, Patient Position: Sitting, Cuff Size: Normal)   Pulse 73   Temp (!) 97.5 ?F (36.4 ?C) (Oral)   Resp 16   Ht 4\' 11"  (1.499 m)   Wt 106 lb 11.2 oz (48.4 kg)   LMP  (LMP Unknown)   SpO2 95%   BMI 21.55 kg/m?  ?BP Readings from Last 3 Encounters:  ?03/27/22 119/65  ?03/22/22 120/64  ?03/07/22 (!) 144/80  ? ?Wt Readings from Last 3 Encounters:  ?03/27/22 106 lb 11.2 oz (48.4 kg)  ?03/22/22 107 lb 8 oz (48.8 kg)  ?03/07/22 111 lb 6 oz (50.5 kg)  ? ?  ? ?Physical Exam ?Vitals and nursing note reviewed.  ?Constitutional:   ?   General: She is not in acute distress. ?   Appearance: Normal appearance. She is normal weight. She is not ill-appearing, toxic-appearing or diaphoretic.  ?HENT:  ?   Head: Normocephalic and atraumatic.  ?Cardiovascular:  ?   Rate and Rhythm: Normal rate and regular rhythm.  ?    Pulses: Normal pulses.  ?   Heart sounds: Normal heart sounds. No murmur heard. ?  No friction rub. No gallop.  ?Pulmonary:  ?   Effort: Pulmonary effort is normal. No respiratory distress.  ?   Breath sounds: Normal breath sounds. No stridor. No  wheezing, rhonchi or rales.  ?Chest:  ?   Chest wall: No tenderness.  ?Abdominal:  ?   General: Bowel sounds are normal.  ?   Palpations: Abdomen is soft.  ?Musculoskeletal:     ?   General: Signs of injury present. No swelling, tenderness or deformity. Normal range of motion.  ?   Right lower leg: 2+ Edema present.  ?   Left lower leg: 3+ Edema present.  ?Skin: ?   General: Skin is warm and dry.  ?   Capillary Refill: Capillary refill takes less than 2 seconds.  ?   Coloration: Skin is not jaundiced or pale.  ?   Findings: No bruising, erythema, lesion or rash.  ?Neurological:  ?   Mental Status: She is alert and oriented to person, place, and time. Mental status is at baseline.  ?   Cranial Nerves: No cranial nerve deficit.  ?   Sensory: Sensory deficit present.  ?   Motor: No weakness.  ?   Coordination: Coordination normal.  ?Psychiatric:     ?   Mood and Affect: Mood normal.     ?   Behavior: Behavior normal.     ?   Thought Content: Thought content normal.     ?   Judgment: Judgment normal.  ? ? ?Depression Screen ? ?  03/27/2022  ?  2:25 PM 02/05/2022  ?  9:26 AM  ?PHQ 2/9 Scores  ?PHQ - 2 Score 0 0  ?PHQ- 9 Score 2   ? ?No results found for any visits on 03/27/22. ? Assessment & Plan   ?  ? ?Problem List Items Addressed This Visit   ? ?  ? Cardiovascular and Mediastinum  ? Acute on chronic combined systolic and diastolic CHF (congestive heart failure) (HCC)  ?  Chronic, stable ?Some LE edema noted ?L>R s/p injury; f/b wound care with [facility]; M visits with wonuc care ?Continue 40mg  Torsemide with KCLOR if additional dose needed ?F/b cards ? ?  ?  ? Paroxysmal atrial fibrillation (HCC) - Primary  ?  Chronic, stable ?F/b cards ?On aggrenox 200-25 BID ?Hx of PPM ?Denies  fast, irregular heart beats at this time ?Some SOB with excessive exertion  ? ?  ?  ? Primary hypertension  ?  Chronic, stable ?Denies CP ?Denies SOB/ DOE ?Denies low blood pressure/hypotension ?Denies vision chan

## 2022-04-01 ENCOUNTER — Encounter: Payer: Medicare PPO | Admitting: Physician Assistant

## 2022-04-01 DIAGNOSIS — I87312 Chronic venous hypertension (idiopathic) with ulcer of left lower extremity: Secondary | ICD-10-CM | POA: Diagnosis not present

## 2022-04-01 NOTE — Progress Notes (Addendum)
[place], [name] ([id number]) ?Visit Report for 04/01/2022 ?Chief Complaint Document Details ?Patient Name: [name], [name] ?Date of Service: 04/01/2022 8:00 AM ?Medical Record Number: [id number] ?Patient Account Number: [id number] ?Date of Birth/Sex: [birth year] (88 y.o. F) ?Treating RN: Yevonne Pax ?Primary Care Provider: Merita Norton Other Clinician: ?Referring Provider: Doran Stabler ?Treating Provider/Extender: Allen Derry ?Weeks in Treatment: 1 ?Information Obtained from: Patient ?Chief Complaint ?Left LE ulcer ?Electronic Signature(s) ?Signed: 04/01/2022 8:09:42 AM By: Lenda Kelp PA-C ?Entered By: Lenda Kelp on 04/01/2022 08:09:42 ?[place], [name] ([id number]) ?-------------------------------------------------------------------------------- ?Debridement Details ?Patient Name: [name] ?Date of Service: 04/01/2022 8:00 AM ?Medical Record Number: [id number] ?Patient Account Number: [id number] ?Date of Birth/Sex: 07-19-1925 (88 y.o. F) ?Treating RN: Yevonne Pax ?Primary Care Provider: Merita Norton Other Clinician: ?Referring Provider: Doran Stabler ?Treating Provider/Extender: Allen Derry ?Weeks in Treatment: 1 ?Debridement Performed for ?Wound #1 Left,Posterior Lower Leg ?Assessment: ?Performed By: Physician [name]., PA-C ?Debridement Type: Debridement ?Level of Consciousness (Pre- ?Awake and Alert ?procedure): ?Pre-procedure Verification/Time Out ?Yes - 08:28 ?Taken: ?Start Time: 08:28 ?Total Area Debrided (L x W): 3 (cm) x 3.5 (cm) = 10.5 (cm?) ?Tissue and other material ?Viable, Non-Viable, Slough, Subcutaneous, Skin: Dermis , Skin: Epidermis, Biofilm, Slough ?debrided: ?Level: Skin/Subcutaneous Tissue ?Debridement Description: Excisional ?Instrument: Curette ?Bleeding: Moderate ?Hemostasis Achieved: Pressure ?End Time: 08:33 ?Procedural Pain: 0 ?Post Procedural Pain: 0 ?Response to Treatment: Procedure was tolerated well ?Level of Consciousness (Post- ?Awake and Alert ?procedure): ?Post Debridement  Measurements of Total Wound ?Length: (cm) 3 ?Width: (cm) 3.5 ?Depth: (cm) 1.1 ?Volume: (cm?) 9.071 ?Character of Wound/Ulcer Post Debridement: Improved ?Post Procedure Diagnosis ?Same as Pre-procedure ?Electronic Signature(s) ?Signed: 04/01/2022 5:11:41 PM By: Lenda Kelp PA-C ?Signed: 04/03/2022 8:47:39 AM By: Yevonne Pax RN ?Entered ByYevonne Pax on 04/01/2022 08:32:37 ?[place], [name] ([id number]) ?-------------------------------------------------------------------------------- ?HPI Details ?Patient Name: [name], [name] ?Date of Service: 04/01/2022 8:00 AM ?Medical Record Number: [id number] ?Patient Account Number: [id number] ?Date of Birth/Sex: 1925-02-13 (88 y.o. F) ?Treating RN: Yevonne Pax ?Primary Care Provider: Merita Norton Other Clinician: ?Referring Provider: Doran Stabler ?Treating Provider/Extender: Allen Derry ?Weeks in Treatment: 1 ?History of Present Illness ?HPI Description: 03-19-2022 upon evaluation today patient appears to be doing poorly in regard to the left posterior lower extremity ulcer. This ?is something that she actually struck on a car door getting out of the car and this was around January 18, 2022. She was going to urgent care ?due to her leg swelling when this occurred. Since that time she has had a tremendous amount of bleeding she also did have recommendations for ?medicated gauze to be used by urgent care. Initially she actually apparently had sutures placed over this area but then when the sutures were ?removed this dehisced to some degree. She was placed on Omnicef in the past there is no signs of infection right now but again that is definitely ?something we will need to keep a close eye on especially with the depth of the wound. The good news that she does not seem to be having any ?pain which is excellent. And again right now they have been using medicated gauze packing into the wound. She did have an x-ray on February ?26, 2023 which was negative for any signs of bone  abnormality which is great news. Patient does have a history of chronic venous ?insufficiency/hypertension with the lower extremity ulcer now at this point. She also has congestive heart failure, hypertension, she is on long- ?term anticoagulant therapy which is Aggrenox due to a pacemaker. ?03-25-2022 upon evaluation today  patient appears to be doing better in regard to the wound on her leg. She has been tolerating the dressing ?changes without complication. Fortunately I do not see any signs of active infection locally or systemically at this time which is great news. ?04-01-2022 upon evaluation today patient appears to be doing well with regard to her right leg. Fortunately there does not appear to be any ?evidence of active infection locally nor systemically which is great news and overall I am extremely pleased with where we stand today. I do ?believe that she is making progress there is still some need for sharp debridement at this point. ?Electronic Signature(s) ?Signed: 04/01/2022 8:54:58 AM By: Lenda Kelp PA-C ?Entered By: Lenda Kelp on 04/01/2022 08:54:58 ?[place], [name] ([id number]) ?-------------------------------------------------------------------------------- ?Physical Exam Details ?Patient Name: [name], [name] ?Date of Service: 04/01/2022 8:00 AM ?Medical Record Number: [id number] ?Patient Account Number: [id number] ?Date of Birth/Sex: November 28, 1925 (88 y.o. F) ?Treating RN: Yevonne Pax ?Primary Care Provider: Merita Norton Other Clinician: ?Referring Provider: Doran Stabler ?Treating Provider/Extender: Allen Derry ?Weeks in Treatment: 1 ?Constitutional ?Well-nourished and well-hydrated in no acute distress. ?Respiratory ?normal breathing without difficulty. ?Psychiatric ?this patient is able to make decisions and demonstrates good insight into disease process. Alert and Oriented x 3. pleasant and cooperative. ?Notes ?Upon inspection patient's wound did require sharp debridement to clear away some of  the necrotic debris the patient tolerated this today without ?complication and postdebridement the wound bed appears to be doing much better which is great news. ?Electronic Signature(s) ?Signed: 04/01/2022 8:55:23 AM By: Lenda Kelp PA-C ?Entered By: Lenda Kelp on 04/01/2022 08:55:22 ?[place], [name] ([id number]) ?-------------------------------------------------------------------------------- ?Physician Orders Details ?Patient Name: [name], [name] ?Date of Service: 04/01/2022 8:00 AM ?Medical Record Number: [id number] ?Patient Account Number: [id number] ?Date of Birth/Sex: [birth year] (88 y.o. 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 ?Home Health ?o Home Health Company: [facility] ?o ADMIT to Home Health for wound care. May utilize formulary equivalent dressing for wound treatment orders unless ?otherwise specified. Home Health Nurse may visit PRN to address patientos wound care needs. ?o Scheduled days for dressing changes to be completed; exception, patient has scheduled wound care visit that day. - Three ?times a week. Once in clinic and twice by homehealth. ?o **Please direct any NON-WOUND related issues/requests for orders to patient's Primary Care Physician. **If  current ?dressing causes regression in wound condition, may D/C ordered dressing product/s and apply Normal Saline Moist ?Dressing daily until next Wound Healing Center or Other MD appointment. **Notify Wound He

## 2022-04-01 NOTE — Progress Notes (Addendum)
[place], [name] ([id number]) ?Visit Report for 04/01/2022 ?Arrival Information Details ?Patient Name: [name], [name] ?Date of Service: 04/01/2022 8:00 AM ?Medical Record Number: [id number] ?Patient Account Number: [id number] ?Date of Birth/Sex: 06/09/1925 (88 y.o. F) ?Treating RN: Yevonne Pax ?Primary Care Naftula Donahue: Merita Norton Other Clinician: ?Referring Rogue Rafalski: Doran Stabler ?Treating Trinidad Ingle/Extender: Allen Derry ?Weeks in Treatment: 1 ?Visit Information History Since Last Visit ?All ordered tests and consults were completed: No ?Patient Arrived: [name] ?Added or deleted any medications: No ?Arrival Time: 08:11 ?Any new allergies or adverse reactions: No ?Accompanied By: daughter ?Had a fall or experienced change in No ?Transfer Assistance: None ?activities of daily living that may affect ?Patient Identification Verified: Yes ?risk of falls: ?Secondary Verification Process Completed: Yes ?Signs or symptoms of abuse/neglect since last visito No ?Patient Requires Transmission-Based No ?Hospitalized since last visit: No ?Precautions: ?Implantable device outside of the clinic excluding No ?Patient Has Alerts: Yes ?cellular tissue based products placed in the center ?Patient Alerts: Patient on Blood Thinner ?since last visit: ?Aggrenox ?Has Dressing in Place as Prescribed: Yes ?NOT Diabetic ?Pain Present Now: No ?03/19/22 ?Non-compressible >220 ?Legally Blind.Marland KitchenMarland KitchenPatient ?follows you down ?hallway ?Electronic Signature(s) ?Signed: 04/03/2022 8:47:39 AM By: Yevonne Pax RN ?Entered By: Yevonne Pax on 04/01/2022 08:11:28 ?[place], [name] ([id number]) ?-------------------------------------------------------------------------------- ?Clinic Level of Care Assessment Details ?Patient Name: [name], [name] ?Date of Service: 04/01/2022 8:00 AM ?Medical Record Number: [id number] ?Patient Account Number: [id number] ?Date of Birth/Sex: [birth year] (88 y.o. F) ?Treating RN: Yevonne Pax ?Primary Care Brantlee Penn: Merita Norton Other  Clinician: ?Referring Shakhia Gramajo: Doran Stabler ?Treating Jahquez Steffler/Extender: Allen Derry ?Weeks in Treatment: 1 ?Clinic Level of Care Assessment Items ?TOOL 1 Quantity Score ?[]  - Use when EandM and Procedure is performed on INITIAL visit 0 ?ASSESSMENTS - Nursing Assessment / Reassessment ?[]  - General Physical Exam (combine w/ comprehensive assessment (listed just below) when performed on new ?0 ?pt. evals) ?[]  - 0 ?Comprehensive Assessment (HX, ROS, Risk Assessments, Wounds Hx, etc.) ?ASSESSMENTS - Wound and Skin Assessment / Reassessment ?[]  - Dermatologic / Skin Assessment (not related to wound area) 0 ?ASSESSMENTS - Ostomy and/or Continence Assessment and Care ?[]  - Incontinence Assessment and Management 0 ?[]  - 0 ?Ostomy Care Assessment and Management (repouching, etc.) ?PROCESS - Coordination of Care ?[]  - Simple Patient / Family Education for ongoing care 0 ?[]  - 0 ?Complex (extensive) Patient / Family Education for ongoing care ?[]  - 0 ?Staff obtains Consents, Records, Test Results / Process Orders ?[]  - 0 ?Staff telephones HHA, Nursing Homes / Clarify orders / etc ?[]  - 0 ?Routine Transfer to another Facility (non-emergent condition) ?[]  - 0 ?Routine Hospital Admission (non-emergent condition) ?[]  - 0 ?New Admissions / / Ordering NPWT, Apligraf, etc. ?[]  - 0 ?Emergency Hospital Admission (emergent condition) ?PROCESS - Special Needs ?[]  - Pediatric / Minor Patient Management 0 ?[]  - 0 ?Isolation Patient Management ?[]  - 0 ?Hearing / Language / Visual special needs ?[]  - 0 ?Assessment of Community assistance (transportation, D/C planning, etc.) ?[]  - 0 ?Additional assistance / Altered mentation ?[]  - 0 ?Support Surface(s) Assessment (bed, cushion, seat, etc.) ?INTERVENTIONS - Miscellaneous ?[]  - External ear exam 0 ?[]  - 0 ?Patient Transfer (multiple staff / / Similar devices) ?[]  - 0 ?Simple Staple / Suture removal (25 or less) ?[]  - 0 ?Complex Staple / Suture removal  (26 or more) ?[]  - 0 ?Hypo/Hyperglycemic Management (do not check if billed separately) ?[]  - 0 ?Ankle / Brachial Index (ABI) - do not check if billed separately ?Has the patient been  seen at the hospital within the last three years: Yes ?Total Score: 0 ?Level Of Care: ____ ?[place], [name] ([id number]) ?Electronic Signature(s) ?Signed: 04/03/2022 8:47:39 AM By: Yevonne Pax RN ?Entered By: Yevonne Pax on 04/01/2022 08:33:30 ?[place], [name] ([id number]) ?-------------------------------------------------------------------------------- ?Encounter Discharge Information Details ?Patient Name: [name], [name] ?Date of Service: 04/01/2022 8:00 AM ?Medical Record Number: [id number] ?Patient Account Number: [id number] ?Date of Birth/Sex: Apr 10, 1925 (88 y.o. F) ?Treating RN: Yevonne Pax ?Primary Care Ngoc Daughtridge: Merita Norton Other Clinician: ?Referring Audra Bellard: Doran Stabler ?Treating Marne Meline/Extender: Allen Derry ?Weeks in Treatment: 1 ?Encounter Discharge Information Items Post Procedure Vitals ?Discharge Condition: Stable ?Temperature (?F): 97.9 ?Ambulatory Status: [name] ?Pulse (bpm): 88 ?Discharge Destination: Home ?Respiratory Rate (breaths/min): 18 ?Transportation: Private Auto ?Blood Pressure (mmHg): 119/71 ?Accompanied By: daughter ?Schedule Follow-up Appointment: Yes ?Clinical Summary of Care: Patient Declined ?Electronic Signature(s) ?Signed: 04/03/2022 8:47:39 AM By: Yevonne Pax RN ?Entered ByYevonne Pax on 04/01/2022 08:35:22 ?[place], [name] ([id number]) ?-------------------------------------------------------------------------------- ?Lower Extremity Assessment Details ?Patient Name: [name], [name] ?Date of Service: 04/01/2022 8:00 AM ?Medical Record Number: [id number] ?Patient Account Number: [id number] ?Date of Birth/Sex: 01-08-1925 (88 y.o. F) ?Treating RN: Yevonne Pax ?Primary Care Konnor Vondrasek: Merita Norton Other Clinician: ?Referring Carvin Almas: Doran Stabler ?Treating Crickett Abbett/Extender: Allen Derry ?Weeks in Treatment:  1 ?Edema Assessment ?Assessed: [Left: No] [Right: No] ?Edema: [Left: Ye] [Right: s] ?Calf ?Left: Right: ?Point of Measurement: 30 cm From Medial Instep 28 cm ?Ankle ?Left: Right: ?Point of Measurement: 10 cm From Medial Instep 22 cm ?Vascular Assessment ?Pulses: ?Dorsalis Pedis ?Palpable: [Left:Yes] ?Electronic Signature(s) ?Signed: 04/03/2022 8:47:39 AM By: Yevonne Pax RN ?Entered By: Yevonne Pax on 04/01/2022 08:21:12 ?[place], [name] ([id number]) ?-------------------------------------------------------------------------------- ?Multi Wound Chart Details ?Patient Name: [name], [name] ?Date of Service: 04/01/2022 8:00 AM ?Medical Record Number: [id number] ?Patient Account Number: [id number] ?Date of Birth/Sex: 03-Feb-1925 (88 y.o. F) ?Treating RN: Yevonne Pax ?Primary Care Tarvis Blossom: Merita Norton Other Clinician: ?Referring Linley Moxley: Doran Stabler ?Treating Shermika Balthaser/Extender: Allen Derry ?Weeks in Treatment: 1 ?Vital Signs ?Height(in): 59 ?Pulse(bpm): 88 ?Weight(lbs): 107 ?Blood Pressure(mmHg): 119/71 ?Body Mass Index(BMI): 21.6 ?Temperature(??F): 97.9 ?Respiratory Rate(breaths/min): 18 ?Photos: [N/A:N/A] ?Wound Location: Left, Posterior Lower Leg N/A N/A ?Wounding Event: Trauma N/A N/A ?Primary Etiology: Trauma, Other N/A N/A ?Comorbid History: Congestive Heart Failure, N/A N/A ?Hypertension ?Date Acquired: 01/18/2022 N/A N/A ?Weeks of Treatment: 1 N/A N/A ?Wound Status: Open N/A N/A ?Wound Recurrence: No N/A N/A ?Measurements L x W x D (cm) 3x3.5x1.1 N/A N/A ?Area (cm?) : 8.247 N/A N/A ?Volume (cm?) : 9.071 N/A N/A ?% Reduction in Area: 33.30% N/A N/A ?% Reduction in Volume: 76.30% N/A N/A ?Classification: Full Thickness Without Exposed N/A N/A ?Support Structures ?Exudate Amount: Medium N/A N/A ?Exudate Type: Serous N/A N/A ?Exudate Color: amber N/A N/A ?Granulation Amount: Medium (34-66%) N/A N/A ?Granulation Quality: Red N/A N/A ?Necrotic Amount: Medium (34-66%) N/A N/A ?Necrotic Tissue: Eschar, Adherent Slough N/A  N/A ?Exposed Structures: ?Fat Layer (Subcutaneous Tissue): N/A N/A ?Yes ?Fascia: No ?Tendon: No ?Muscle: No ?Joint: No ?Bone: No ?Epithelialization: None N/A N/A ?Treatment Notes ?Electronic Signature(s) ?Signed: 4/26

## 2022-04-06 NOTE — Progress Notes (Signed)
? Patient ID: [name], female    DOB: [birth year], 88 y.o.   MRN: [id number] ? ?HPI ? ?Anthony Roland is a 88 year old female with a history of afib, pacemaker, and unspecified heart failure.  ? ?Echo report from 02/26/22 reviewed and showed an EF of 60-65% along with mild LVH, moderately elevated PA pressure, moderate MR, moderate/severe TR, mild/moderate AS and severe LAE.  ? ?Was in the ED on 02/03/22 for SOB and cough, as well as a leg laceration that occurred while in the ED parking lot. CXR showed prominent central pulmonary vessels and increased interstitial markings, suggesting CHF. BNP 648. She was given lasix 40 mg PO to take, and was felt to be stable to dc back home.  ? ?She presents today for a follow-up visit with a chief complaint of moderate fatigue with little exertion. Describes this as chronic in nature. Has associated weight loss, slight pedal edema and intermittent agitation/irritability along with this. She denies any difficulty sleeping, dizziness, abdominal distention, palpitations, chest pain, shortness of breath, cough or weight gain.  ? ?Daughter has noticed that since starting the jardiance, her mom has become intermittently agitated/irritable with her. She is wondering if patient could be getting dehydrated or possibly getting a UTI. Denies any dysuria or urinary urgency.  ? ?Patient and daughter state that her left calf wound is healing nicely and continues to go to the wound center weekly for treatment.  ? ?Past Medical History:  ?Diagnosis Date  ? Arrhythmia   ? atrial fibrillation  ? Congestive heart failure (CHF) (HCC)   ? Hypertension   ? Pacemaker 2015  ? ?Past Surgical History:  ?Procedure Laterality Date  ? ABDOMINAL HYSTERECTOMY    ? EYE SURGERY    ? MOHS SURGERY  2023  ? PACEMAKER PLACEMENT N/A 2007  ? 2017  ? ?Family History  ?Problem Relation Age of Onset  ? Heart failure Mother   ? Heart attack Father   ? Heart attack Daughter   ? ?Social History  ? ?Tobacco Use  ? Smoking  status: Never  ? Smokeless tobacco: Never  ?Substance Use Topics  ? Alcohol use: Not Currently  ? ?Allergies  ?Allergen Reactions  ? Moxifloxacin Itching and Swelling  ?  Swelling and itching of the eyes.  ? Tobramycin Itching and Swelling  ?  Swelling and itching of the eyelids.  ? Codeine   ?  Other reaction(s): Hallucination  ? ?Prior to Admission medications   ?Medication Sig Start Date End Date Taking? Authorizing Provider  ?amLODipine (NORVASC) 5 MG tablet Take 5 mg by mouth daily. 11/16/21  Yes [provider]  ?atorvastatin (LIPITOR) 10 MG tablet Take 10 mg by mouth daily. 01/11/22  Yes [provider]  ?calcium carbonate (OS-CAL - DOSED IN MG OF ELEMENTAL CALCIUM) 1250 (500 Ca) MG tablet Take 1 tablet by mouth 2 (two) times daily with a meal.   Yes [provider]  ?dipyridamole-aspirin (AGGRENOX) 200-25 MG 12hr capsule Take 1 capsule by mouth 2 (two) times daily. 12/13/21  Yes [provider]  ?empagliflozin (JARDIANCE) 10 MG TABS tablet Take 1 tablet (10 mg total) by mouth daily before breakfast. 03/07/22  Yes Delma Freeze, FNP  ?losartan (COZAAR) 50 MG tablet Take 50 mg by mouth 2 (two) times daily. 01/11/22  Yes [provider]  ?metoprolol (TOPROL-XL) 200 MG 24 hr tablet Take 200 mg by mouth daily.   Yes [provider]  ?Multiple Vitamins-Minerals (PRESERVISION AREDS 2 PO) Take  1 tablet by mouth in the morning and at bedtime.   Yes [provider]  ?multivitamin-iron-minerals-folic acid (CENTRUM) chewable tablet Chew 1 tablet by mouth daily.   Yes [provider]  ?Omega-3 Fatty Acids (FISH OIL) 1000 MG CAPS Take 1 capsule by mouth daily.   Yes [provider]  ?potassium chloride (KLOR-CON) 10 MEQ tablet Take 10 mEq by mouth as needed. Only if she takes an additional dose of lasix   Yes [provider]  ?torsemide (DEMADEX) 20 MG tablet Take 2 tablets (40 mg total) by mouth daily. And additional 20mg  if needed 02/13/22  05/14/22 Yes Delma FreezeHackney, Shauntay Brunelli A, FNP  ? ?Review of Systems  ?Constitutional:  Positive for fatigue. Negative for appetite change.  ?HENT:  Negative for congestion, rhinorrhea and sore throat.   ?Eyes: Negative.   ?Respiratory:  Negative for cough and shortness of breath.   ?Cardiovascular:  Positive for leg swelling. Negative for chest pain and palpitations.  ?Gastrointestinal:  Negative for abdominal distention and abdominal pain.  ?Endocrine: Negative.   ?Genitourinary: Negative.  Negative for dysuria and urgency.  ?Musculoskeletal:  Negative for back pain and neck pain.  ?Skin:  Positive for wound (left calf).  ?Allergic/Immunologic: Negative.   ?Neurological:  Negative for dizziness and light-headedness.  ?Hematological:  Negative for adenopathy. Does not bruise/bleed easily.  ?Psychiatric/Behavioral:  Positive for agitation. Negative for dysphoric mood and sleep disturbance (sleeping on 2 pillows). The patient is not nervous/anxious.   ? ?Vitals:  ? 04/08/22 1501  ?BP: (!) 141/82  ?Pulse: 81  ?Resp: 16  ?SpO2: 99%  ?Weight: 104 lb (47.2 kg)  ?Height: 4\' 11"  (1.499 m)  ? ?Wt Readings from Last 3 Encounters:  ?04/08/22 104 lb (47.2 kg)  ?03/27/22 106 lb 11.2 oz (48.4 kg)  ?03/22/22 107 lb 8 oz (48.8 kg)  ? ?Lab Results  ?Component Value Date  ? CREATININE 0.88 03/07/2022  ? CREATININE 0.86 02/27/2022  ? CREATININE 0.83 02/20/2022  ? ?Physical Exam ?Vitals and nursing note reviewed. Exam conducted with a chaperone present (daughter).  ?Constitutional:   ?   Appearance: Normal appearance.  ?HENT:  ?   Head: Normocephalic and atraumatic.  ?Cardiovascular:  ?   Rate and Rhythm: Normal rate and regular rhythm.  ?Pulmonary:  ?   Effort: Pulmonary effort is normal. No respiratory distress.  ?   Breath sounds: No wheezing or rales.  ?Abdominal:  ?   General: There is no distension.  ?   Palpations: Abdomen is soft.  ?   Tenderness: There is no abdominal tenderness.  ?Musculoskeletal:     ?   General: No tenderness.  ?    Cervical back: Normal range of motion and neck supple.  ?   Right lower leg: Edema (trace pitting) present.  ?   Left lower leg: Edema (currently wrapped) present.  ?Skin: ?   General: Skin is warm and dry.  ?Neurological:  ?   General: No focal deficit present.  ?   Mental Status: She is alert and oriented to person, place, and time.  ?Psychiatric:     ?   Mood and Affect: Mood normal.     ?   Behavior: Behavior normal.     ?   Thought Content: Thought content normal.  ?  ?Assessment & Plan: ? ?Chronic heart failure with preserved ejection fraction with structural changes (LAE)- ?- NYHA III ?- euvolemic today ?- weighing daily; reminded to call for an overnight weight gain of > 2  pounds or a weekly weight gain of > 5 pounds ?- weight down 7 pounds from last visit here 1 month ago ?- goes to an adult day care each day, reports that she eats low sodium diet there ?- trying to keep daily fluid intake to 60-64 ounces daily ?- on GDMT of jardiance ?- check BMP since jardiance started at last visit ?- will decrease tomorrow's torsemide to 20mg  and reassess after getting lab results back as she could be getting dry or possibly getting UTI ?- if labs are normal, she may need to get urine checked at PCP office; if that is normal and irritation continues, could be a reaction to the jardiance and she may not be able to continue it ?- BNP 648 on 02/03/22 ? ?Atrial fibrillation ?- RRR today, has pacemaker ?- saw cardiology, Dr. 02/05/22 03/22/22 ?- BMP 03/07/22 reviewed and showed sodium 135, potassium 3.6, creatinine 0.88 & GFR >60 ? ?3: Lymphedema- ?- stage 2 ?- wears compression socks daily; edema much improved ?- is quite active and walked into the office ?- goes to an adult daycare daily ?- saw wound center 03/09/22) 04/01/22 ?- saw PCP 04/03/22) 03/27/22 ? ? ?Medication list reviewed. ? ?Return in 2 months, sooner if needed.  ? ? ? ? ? ? ? ? ?

## 2022-04-08 ENCOUNTER — Encounter: Payer: Self-pay | Admitting: Family

## 2022-04-08 ENCOUNTER — Ambulatory Visit: Payer: Medicare PPO | Attending: Family | Admitting: Family

## 2022-04-08 ENCOUNTER — Encounter: Payer: Medicare PPO | Attending: Physician Assistant | Admitting: Physician Assistant

## 2022-04-08 ENCOUNTER — Other Ambulatory Visit
Admission: RE | Admit: 2022-04-08 | Discharge: 2022-04-08 | Disposition: A | Discharge status: Home / Self Care | Payer: Medicare PPO | Source: Ambulatory Visit | Attending: Family | Admitting: Family

## 2022-04-08 VITALS — BP 141/82 | HR 81 | Resp 16 | Ht 59.0 in | Wt 104.0 lb

## 2022-04-08 DIAGNOSIS — I5032 Chronic diastolic (congestive) heart failure: Secondary | ICD-10-CM | POA: Diagnosis not present

## 2022-04-08 DIAGNOSIS — Z7901 Long term (current) use of anticoagulants: Secondary | ICD-10-CM | POA: Diagnosis not present

## 2022-04-08 DIAGNOSIS — Z950 Presence of cardiac pacemaker: Secondary | ICD-10-CM | POA: Insufficient documentation

## 2022-04-08 DIAGNOSIS — W2209XA Striking against other stationary object, initial encounter: Secondary | ICD-10-CM | POA: Insufficient documentation

## 2022-04-08 DIAGNOSIS — S81812A Laceration without foreign body, left lower leg, initial encounter: Secondary | ICD-10-CM | POA: Diagnosis present

## 2022-04-08 DIAGNOSIS — L97822 Non-pressure chronic ulcer of other part of left lower leg with fat layer exposed: Secondary | ICD-10-CM | POA: Diagnosis not present

## 2022-04-08 DIAGNOSIS — I87312 Chronic venous hypertension (idiopathic) with ulcer of left lower extremity: Secondary | ICD-10-CM | POA: Diagnosis not present

## 2022-04-08 DIAGNOSIS — I4821 Permanent atrial fibrillation: Secondary | ICD-10-CM | POA: Diagnosis not present

## 2022-04-08 DIAGNOSIS — I890 Lymphedema, not elsewhere classified: Secondary | ICD-10-CM | POA: Insufficient documentation

## 2022-04-08 DIAGNOSIS — I4891 Unspecified atrial fibrillation: Secondary | ICD-10-CM | POA: Insufficient documentation

## 2022-04-08 DIAGNOSIS — I110 Hypertensive heart disease with heart failure: Secondary | ICD-10-CM | POA: Insufficient documentation

## 2022-04-08 DIAGNOSIS — I5042 Chronic combined systolic (congestive) and diastolic (congestive) heart failure: Secondary | ICD-10-CM | POA: Diagnosis not present

## 2022-04-08 DIAGNOSIS — I5030 Unspecified diastolic (congestive) heart failure: Secondary | ICD-10-CM | POA: Insufficient documentation

## 2022-04-08 LAB — BASIC METABOLIC PANEL
Anion gap: 9 (ref 5–15)
BUN: 51 mg/dL — ABNORMAL HIGH (ref 8–23)
CO2: 31 mmol/L (ref 22–32)
Calcium: 9 mg/dL (ref 8.9–10.3)
Chloride: 101 mmol/L (ref 98–111)
Creatinine, Ser: 0.97 mg/dL (ref 0.44–1.00)
GFR, Estimated: 54 mL/min — ABNORMAL LOW (ref >60)
Glucose, Bld: 102 mg/dL — ABNORMAL HIGH (ref 70–99)
Potassium: 3.4 mmol/L — ABNORMAL LOW (ref 3.5–5.1)
Sodium: 141 mmol/L (ref 135–145)

## 2022-04-08 NOTE — Progress Notes (Signed)
[name], [name] ([id number]) ?Visit Report for 04/08/2022 ?Arrival Information Details ?Patient Name: [name], [name] ?Date of Service: 04/08/2022 9:45 AM ?Medical Record Number: [id number] ?Patient Account Number: [id number] ?Date of Birth/Sex: 04-Apr-1925 (88 y.o. F) ?Treating RN: Levora Dredge ?Primary Care Linsie Lupo: Tally Joe Other Clinician: ?Referring Justen Fonda: Candise Che ?Treating Remmy Riffe/Extender: Jeri Cos ?Weeks in Treatment: 2 ?Visit Information History Since Last Visit ?Added or deleted any medications: No ?Patient Arrived: Gilford Rile ?Any new allergies or adverse reactions: No ?Arrival Time: 09:58 ?Had a fall or experienced change in No ?Accompanied By: family ?activities of daily living that may affect ?Transfer Assistance: None ?risk of falls: ?Patient Identification Verified: Yes ?Hospitalized since last visit: No ?Secondary Verification Process Completed: Yes ?Has Dressing in Place as Prescribed: Yes ?Patient Requires Transmission-Based No ?Pain Present Now: No ?Precautions: ?Patient Has Alerts: Yes ?Patient Alerts: Patient on Blood Thinner ?Aggrenox ?NOT Diabetic ?03/19/22 ?Non-compressible >220 ?Legally Blind.Marland KitchenMarland KitchenPatient ?follows you down ?hallway ?Electronic Signature(s) ?Signed: 04/08/2022 4:42:16 PM By: Levora Dredge ?Entered By: Levora Dredge on 04/08/2022 10:00:56 ?Cuney, [name] ([id number]) ?-------------------------------------------------------------------------------- ?Clinic Level of Care Assessment Details ?Patient Name: [name], [name] ?Date of Service: 04/08/2022 9:45 AM ?Medical Record Number: [id number] ?Patient Account Number: [id number] ?Date of Birth/Sex: 1925/09/01 (88 y.o. F) ?Treating RN: Levora Dredge ?Primary Care Yehonatan Grandison: Tally Joe Other Clinician: ?Referring Makela Niehoff: Candise Che ?Treating Achsah Mcquade/Extender: Jeri Cos ?Weeks in Treatment: 2 ?Clinic Level of Care Assessment Items ?TOOL 4 Quantity Score ?[]  - Use when only an EandM is performed on FOLLOW-UP visit  0 ?ASSESSMENTS - Nursing Assessment / Reassessment ?X - Reassessment of Co-morbidities (includes updates in patient status) 1 10 ?X- 1 5 ?Reassessment of Adherence to Treatment Plan ?ASSESSMENTS - Wound and Skin Assessment / Reassessment ?X - Simple Wound Assessment / Reassessment - one wound 1 5 ?[]  - 0 ?Complex Wound Assessment / Reassessment - multiple wounds ?[]  - 0 ?Dermatologic / Skin Assessment (not related to wound area) ?ASSESSMENTS - Focused Assessment ?X - Circumferential Edema Measurements - multi extremities 1 5 ?[]  - 0 ?Nutritional Assessment / Counseling / Intervention ?[]  - 0 ?Lower Extremity Assessment (monofilament, tuning fork, pulses) ?[]  - 0 ?Peripheral Arterial Disease Assessment (using hand held doppler) ?ASSESSMENTS - Ostomy and/or Continence Assessment and Care ?[]  - Incontinence Assessment and Management 0 ?[]  - 0 ?Ostomy Care Assessment and Management (repouching, etc.) ?PROCESS - Coordination of Care ?X - Simple Patient / Family Education for ongoing care 1 15 ?[]  - 0 ?Complex (extensive) Patient / Family Education for ongoing care ?[]  - 0 ?Staff obtains Consents, Records, Test Results / Process Orders ?[]  - 0 ?Staff telephones HHA, Nursing Homes / Clarify orders / etc ?[]  - 0 ?Routine Transfer to another Facility (non-emergent condition) ?[]  - 0 ?Routine Hospital Admission (non-emergent condition) ?[]  - 0 ?New Admissions / Biomedical engineer / Ordering NPWT, Apligraf, etc. ?[]  - 0 ?Emergency Hospital Admission (emergent condition) ?X- 1 10 ?Simple Discharge Coordination ?[]  - 0 ?Complex (extensive) Discharge Coordination ?PROCESS - Special Needs ?[]  - Pediatric / Minor Patient Management 0 ?[]  - 0 ?Isolation Patient Management ?[]  - 0 ?Hearing / Language / Visual special needs ?[]  - 0 ?Assessment of Community assistance (transportation, D/C planning, etc.) ?[]  - 0 ?Additional assistance / Altered mentation ?[]  - 0 ?Support Surface(s) Assessment (bed, cushion, seat,  etc.) ?INTERVENTIONS - Wound Cleansing / Measurement ?[name], [name] ([id number]) ?X- 1 5 ?Simple Wound Cleansing - one wound ?[]  - 0 ?Complex Wound Cleansing - multiple wounds ?X- 1 5 ?Wound Imaging (photographs - any number of wounds) ?[]  -  0 ?Wound Tracing (instead of photographs) ?X- 1 5 ?Simple Wound Measurement - one wound ?[]  - 0 ?Complex Wound Measurement - multiple wounds ?INTERVENTIONS - Wound Dressings ?X - Small Wound Dressing one or multiple wounds 1 10 ?[]  - 0 ?Medium Wound Dressing one or multiple wounds ?[]  - 0 ?Large Wound Dressing one or multiple wounds ?X- 1 5 ?Application of Medications - topical ?[]  - 0 ?Application of Medications - injection ?INTERVENTIONS - Miscellaneous ?[]  - External ear exam 0 ?[]  - 0 ?Specimen Collection (cultures, biopsies, blood, body fluids, etc.) ?[]  - 0 ?Specimen(s) / Culture(s) sent or taken to Lab for analysis ?[]  - 0 ?Patient Transfer (multiple staff / Civil Service fast streamer / Similar devices) ?[]  - 0 ?Simple Staple / Suture removal (25 or less) ?[]  - 0 ?Complex Staple / Suture removal (26 or more) ?[]  - 0 ?Hypo / Hyperglycemic Management (close monitor of Blood Glucose) ?[]  - 0 ?Ankle / Brachial Index (ABI) - do not check if billed separately ?X- 1 5 ?Vital Signs ?Has the patient been seen at the hospital within the last three years: Yes ?Total Score: 85 ?Level Of Care: New/Established - Level ?3 ?Electronic Signature(s) ?Signed: 04/08/2022 4:42:16 PM By: Levora Dredge ?Entered By: Levora Dredge on 04/08/2022 11:51:10 ?[name], [name] ([id number]) ?-------------------------------------------------------------------------------- ?Encounter Discharge Information Details ?Patient Name: [name], [name] ?Date of Service: 04/08/2022 9:45 AM ?Medical Record Number: [id number] ?Patient Account Number: [id number] ?Date of Birth/Sex: Jan 17, 1925 (88 y.o. F) ?Treating RN: Levora Dredge ?Primary Care Damond Borchers: Tally Joe Other Clinician: ?Referring Tiara Bartoli: Candise Che ?Treating  Kaprice Kage/Extender: Jeri Cos ?Weeks in Treatment: 2 ?Encounter Discharge Information Items ?Discharge Condition: Stable ?Ambulatory Status: Gilford Rile ?Discharge Destination: Home ?Transportation: Private Auto ?Accompanied By: family ?Schedule Follow-up Appointment: Yes ?Clinical Summary of Care: Patient Declined ?Electronic Signature(s) ?Signed: 04/08/2022 11:52:15 AM By: Levora Dredge ?Entered By: Levora Dredge on 04/08/2022 11:52:15 ?[name], [name] ([id number]) ?-------------------------------------------------------------------------------- ?Lower Extremity Assessment Details ?Patient Name: [name], [name] ?Date of Service: 04/08/2022 9:45 AM ?Medical Record Number: [id number] ?Patient Account Number: [id number] ?Date of Birth/Sex: 1925-09-06 (88 y.o. F) ?Treating RN: Levora Dredge ?Primary Care Laelah Siravo: Tally Joe Other Clinician: ?Referring Victorious Kundinger: Candise Che ?Treating Dejanique Ruehl/Extender: Jeri Cos ?Weeks in Treatment: 2 ?Edema Assessment ?Assessed: [Left: No] [Right: No] ?Edema: [Left: Ye] [Right: s] ?Calf ?Left: Right: ?Point of Measurement: 30 cm From Medial Instep 30 cm ?Ankle ?Left: Right: ?Point of Measurement: 10 cm From Medial Instep 23.2 cm ?Vascular Assessment ?Pulses: ?Dorsalis Pedis ?Palpable: [Left:Yes] ?Electronic Signature(s) ?Signed: 04/08/2022 4:42:16 PM By: Levora Dredge ?Entered By: Levora Dredge on 04/08/2022 10:13:12 ?[name], [name] ([id number]) ?-------------------------------------------------------------------------------- ?Multi Wound Chart Details ?Patient Name: CARIAH, TOBEY ?Date of Service: 04/08/2022 9:45 AM ?Medical Record Number: [id number] ?Patient Account Number: [id number] ?Date of Birth/Sex: [birth year] (88 y.o. F) ?Treating RN: Levora Dredge ?Primary Care Sedric Guia: Tally Joe Other Clinician: ?Referring Whit Bruni: Candise Che ?Treating Garald Rhew/Extender: Jeri Cos ?Weeks in Treatment: 2 ?Vital Signs ?Height(in): 59 ?Pulse(bpm): 81 ?Weight(lbs): 107 ?Blood  Pressure(mmHg): 115/62 ?Body Mass Index(BMI): 21.6 ?Temperature(??F): 97.7 ?Respiratory Rate(breaths/min): 18 ?Photos: [N/A:N/A] ?Wound Location: Left, Posterior Lower Leg N/A N/A ?Wounding Event: Trauma N/A N/A ?Primary Etiology: Trauma, Other N/A

## 2022-04-08 NOTE — Patient Instructions (Addendum)
Continue weighing daily and call for an overnight weight gain of 3 pounds or more or a weekly weight gain of more than 5 pounds. ? ? ?If you have voicemail, please make sure your mailbox is cleaned out so that we may leave a message and please make sure to listen to any voicemails.  ? ? ?Tomorrow only take 1 torsemide in the morning. I will call after I get the lab results back.  ?

## 2022-04-08 NOTE — Progress Notes (Addendum)
[place], [name] ([id number]) [id number] Page 1 of 7 Visit Report for 04/08/2022 Chief Complaint Document Details Patient Name: Date of Service: DONYELL, [name] 04/08/2022 9:45 A M Medical Record Number: [id number] Patient Account Number: [id number] Date of Birth/Sex: Treating RN: 09/01/1925 (88 y.o. Esmeralda Links Primary Care Provider: Merita Norton Other Clinician: Referring Provider: Treating Provider/Extender: Dory Larsen in Treatment: 2 Information Obtained from: Patient Chief Complaint Left LE ulcer Electronic Signature(s) Signed: 04/08/2022 10:01:28 AM By: Lenda Kelp PA-C Entered By: Lenda Kelp on 04/08/2022 10:01:28 -------------------------------------------------------------------------------- HPI Details Patient Name: Date of Service: [name], [name] 04/08/2022 9:45 A M Medical Record Number: [id number] Patient Account Number: [id number] Date of Birth/Sex: Treating RN: 02-02-1925 (88 y.o. Esmeralda Links Primary Care Provider: Merita Norton Other Clinician: Referring Provider: Treating Provider/Extender: Dory Larsen in Treatment: 2 History of Present Illness HPI Description: 03-19-2022 upon evaluation today patient appears to be doing poorly in regard to the left posterior lower extremity ulcer. This is something that she actually struck on a car door getting out of the car and this was around January 18, 2022. She was going to urgent care due to her leg swelling when this occurred. Since that time she has had a tremendous amount of bleeding she also did have recommendations for medicated gauze to be used by urgent care. Initially she actually apparently had sutures placed over this area but then when the sutures were removed this dehisced to some degree. She was placed on Omnicef in the past there is no signs of infection right now but again that is definitely something we will need to keep a close eye on  especially with the depth of the wound. The good news that she does not seem to be having any pain which is excellent. And again right now they have been using medicated gauze packing into the wound. She did have an x-ray on February 03, 2022 which was negative for any signs of bone abnormality which is great news. Patient does have a history of chronic venous insufficiency/hypertension with the lower extremity ulcer now at this point. 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 03-25-2022 upon evaluation today patient appears to be doing better in regard to the wound on her leg. She has been tolerating the dressing changes without complication. Fortunately I do not see any signs of active infection locally or systemically at this time which is great news. 04-01-2022 upon evaluation today patient appears to be doing well with regard to her right leg. Fortunately there does not appear to be any evidence of active infection locally nor systemically which is great news and overall I am extremely pleased with where we stand today. I do believe that she is making progress there is still some need for sharp debridement at this point. [place], [name] ([id number]) [id number] Page 2 of 7 04-08-2022 upon evaluation today patient actually appears to be making excellent progress. I am actually very pleased with where we stand today. I do not see any signs of active infection locally or systemically which is great news. No fevers, chills, nausea, vomiting, or diarrhea. Electronic Signature(s) Signed: 04/08/2022 10:29:12 AM By: Lenda Kelp PA-C Entered By: Lenda Kelp on 04/08/2022 10:29:12 -------------------------------------------------------------------------------- Physical Exam Details Patient Name: Date of Service: [name], [name] 04/08/2022 9:45 A M Medical Record Number: [id number] Patient  Account Number: [id number] Date of Birth/Sex: Treating RN: August 25, 1925 (  88 y.o. Esmeralda Links Primary Care Provider: Merita Norton Other Clinician: Referring Provider: Treating Provider/Extender: Dory Larsen in Treatment: 2 Constitutional Well-nourished and well-hydrated in no acute distress. Respiratory normal breathing without difficulty. Psychiatric this patient is able to make decisions and demonstrates good insight into disease process. Alert and Oriented x 3. pleasant and cooperative. Notes Upon inspection patient's wound bed showed signs of good granulation and epithelization at this point. Fortunately I do not see any evidence of infection locally or systemically which is great news and overall I think that we are headed in the right direction here. Electronic Signature(s) Signed: 04/08/2022 10:29:27 AM By: Lenda Kelp PA-C Entered By: Lenda Kelp on 04/08/2022 10:29:27 -------------------------------------------------------------------------------- Physician Orders Details Patient Name: Date of Service: [name], [name] 04/08/2022 9:45 A M Medical Record Number: [id number] Patient Account Number: [id number] Date of Birth/Sex: Treating RN: [birth year] (88 y.o. Esmeralda Links Primary Care Provider: Merita Norton Other Clinician: Referring Provider: Treating Provider/Extender: Dory Larsen in Treatment: 2 Verbal / Phone Orders: No Diagnosis Coding ICD-10 Coding [place], [name] ([id number]) [id number] Page 3 of 7 Code Description [phone] Laceration without foreign body, left lower leg, initial encounter [phone] Non-pressure chronic ulcer of other part of left lower leg with fat layer exposed I87.312 Chronic venous hypertension (idiopathic) with ulcer of left lower extremity I50.42 Chronic combined systolic (congestive) and diastolic (congestive) heart failure I10 Essential (primary) hypertension Z79.01  Long term (current) use of anticoagulants Z95.0 Presence of cardiac pacemaker Follow-up Appointments Return Appointment in 1 week. Home Health Home Health Company: - [facility] ADMIT to Home Health for wound care. May utilize formulary equivalent dressing for wound treatment orders unless otherwise specified. Home Health Nurse may visit PRN to address patients wound care needs. Scheduled days for dressing changes to be completed; exception, patient has scheduled wound care visit that day. - Three times a week. Once in clinic and twice by homehealth. **Please direct any NON-WOUND related issues/requests for orders to patient's Primary Care Physician. **If current dressing causes regression in wound condition, may D/C ordered dressing product/s and apply Normal Saline Moist Dressing daily until next Wound Healing Center or Other MD appointment. **Notify Wound Healing Center of regression in wound condition at [phone]. Bathing/ Shower/ Hygiene May shower; gently cleanse wound with antibacterial soap, rinse and pat dry prior to dressing wounds Anesthetic (Use 'Patient Medications' Section for Anesthetic Order Entry) Lidocaine applied to wound bed Edema Control - Lymphedema / Segmental Compressive Device / Other Tubigrip single layer applied. - E Elevate, Exercise Daily and A void Standing for Long Periods of Time. Elevate legs to the level of the heart and pump ankles as often as possible Elevate leg(s) parallel to the floor when sitting. Wound Treatment Wound #1 - Lower Leg Wound Laterality: Left, Posterior Cleanser: Soap and Water 3 x Per Week/30 Days Discharge Instructions: Gently cleanse wound with antibacterial soap, rinse and pat dry prior to dressing wounds Cleanser: Wound Cleanser (Home Health) 3 x Per Week/30 Days Discharge Instructions: Wash your hands with soap and water. Remove old dressing, discard into plastic bag and place into trash. Cleanse the wound with Wound Cleanser  prior to applying a clean dressing using gauze sponges, not tissues or cotton balls. Do not scrub or use excessive force. Pat dry using gauze sponges, not tissue or cotton balls. Prim Dressing: Endoform 2x2 (in/in) 3 x Per Week/30 Days ary Discharge Instructions: Apply Endoform as directed Pack dry and then moisten with saline Secondary  Dressing: ABD Pad 5x9 (in/in) (Home Health) 3 x Per Week/30 Days Discharge Instructions: Cover with ABD pad Secured With: Kerlix Roll Sterile or Non-Sterile 6-ply 4.5x4 (yd/yd) 3 x Per Week/30 Days Discharge Instructions: Apply Kerlix as directed toes to just below knee, make sure this is not to tight Secured With: Tubigrip Size E, 3.5x10 (in/yds) 3 x Per Week/30 Days Discharge Instructions: Apply 3 Tubigrip E 3-finger-widths below knee to base of toes to secure dressing and/or for swelling. Electronic Signature(s) Signed: 04/08/2022 4:35:29 PM By: Lenda Kelp PA-C Signed: 04/08/2022 4:42:16 PM By: Angelina Pih Previous Signature: 04/08/2022 11:04:15 AM Version By: Angelina Pih Previous Signature: 04/08/2022 11:45:50 AM Version By: Lenda Kelp PA-C Entered By: Angelina Pih on 04/08/2022 11:50:43 [place], [name] ([id number]) [id number] Page 4 of 7 -------------------------------------------------------------------------------- Problem List Details Patient Name: Date of Service: [name], [name] 04/08/2022 9:45 A M Medical Record Number: [id number] Patient Account Number: [id number] Date of Birth/Sex: Treating RN: [birth year] (88 y.o. Esmeralda Links Primary Care Provider: Merita Norton Other Clinician: Referring Provider: Treating Provider/Extender: Dory Larsen in Treatment: 2 Active Problems ICD-10 Encounter Code Description Active Date MDM Diagnosis S81.812A Laceration without foreign body, left lower leg, initial encounter 03/19/2022 No Yes L97.822 Non-pressure chronic ulcer of other part of left  lower leg with fat layer exposed4/10/2022 No Yes I87.312 Chronic venous hypertension (idiopathic) with ulcer of left lower extremity 03/19/2022 No Yes I50.42 Chronic combined systolic (congestive) and diastolic (congestive) heart failure 03/19/2022 No Yes I10 Essential (primary) hypertension 03/19/2022 No Yes Z79.01 Long term (current) use of anticoagulants 03/19/2022 No Yes Z95.0 Presence of cardiac pacemaker 03/19/2022 No Yes Inactive Problems Resolved Problems Electronic Signature(s) Signed: 04/08/2022 10:01:26 AM By: Lenda Kelp PA-C Entered By: Lenda Kelp on 04/08/2022 10:01:25 [name] ([id number]) [id number] Page 5 of 7 -------------------------------------------------------------------------------- Progress Note Details Patient Name: Date of Service: [name], [name] 04/08/2022 9:45 A M Medical Record Number: [id number] Patient Account Number: [id number] Date of Birth/Sex: Treating RN: Sep 14, 1925 (88 y.o. Esmeralda Links Primary Care Provider: Merita Norton Other Clinician: Referring Provider: Treating Provider/Extender: Dory Larsen in Treatment: 2 Subjective Chief Complaint Information obtained from Patient Left LE ulcer History of Present Illness (HPI) 03-19-2022 upon evaluation today patient appears to be doing poorly in regard to the left posterior lower extremity ulcer. This is something that she actually struck on a car door getting out of the car and this was around January 18, 2022. She was going to urgent care due to her leg swelling when this occurred. Since that time she has had a tremendous amount of bleeding she also did have recommendations for medicated gauze to be used by urgent care. Initially she actually apparently had sutures placed over this area but then when the sutures were removed this dehisced to some degree. She was placed on Omnicef in the past there is no signs of infection right now but again that  is definitely something we will need to keep a close eye on especially with the depth of the wound. The good news that she does not seem to be having any pain which is excellent. And again right now they have been using medicated gauze packing into the wound. She did have an x-ray on February 03, 2022 which was negative for any signs of bone abnormality which is great news. Patient does have a history of chronic venous insufficiency/hypertension with the lower extremity ulcer now at this point. She also has congestive heart failure,  hypertension, she is on long-term anticoagulant therapy which is Aggrenox due to a pacemaker. 03-25-2022 upon evaluation today patient appears to be doing better in regard to the wound on her leg. She has been tolerating the dressing changes without complication. Fortunately I do not see any signs of active infection locally or systemically at this time which is great news. 04-01-2022 upon evaluation today patient appears to be doing well with regard to her right leg. Fortunately there does not appear to be any evidence of active infection locally nor systemically which is great news and overall I am extremely pleased with where we stand today. I do believe that she is making progress there is still some need for sharp debridement at this point. 04-08-2022 upon evaluation today patient actually appears to be making excellent progress. I am actually very pleased with where we stand today. I do not see any signs of active infection locally or systemically which is great news. No fevers, chills, nausea, vomiting, or diarrhea. Objective Constitutional Well-nourished and well-hydrated in no acute distress. Vitals Time Taken: 10:00 AM, Height: 59 in, Weight: 107 lbs, BMI: 21.6, Temperature: 97.7 F, Pulse: 81 bpm, Respiratory Rate: 18 breaths/min, Blood Pressure: 115/62 mmHg. Respiratory normal breathing without difficulty. Psychiatric this patient is able to make decisions and  demonstrates good insight into disease process. Alert and Oriented x 3. pleasant and cooperative. General Notes: Upon inspection patient's wound bed showed signs of good granulation and epithelization at this point. Fortunately I do not see any evidence of infection locally or systemically which is great news and overall I think that we are headed in the right direction here. Integumentary (Hair, Skin) Wound #1 status is Open. Original cause of wound was Trauma. The date acquired was: 01/18/2022. The wound has been in treatment 2 weeks. The wound is located on the Left,Posterior Lower Leg. The wound measures 2.5cm length x 2.7cm width x 1cm depth; 5.301cm^2 area and 5.301cm^3 volume. There is Fat Layer (Subcutaneous Tissue) exposed. There is no undermining noted, however, there is tunneling at 11:00 with a maximum distance of 1cm. There is a medium amount of serous drainage noted. There is medium (34-66%) red granulation within the wound bed. There is a medium (34-66%) amount of necrotic tissue within the wound bed including Eschar. [name], [name] ([id number]) [id number] Page 6 of 7 Assessment Active Problems ICD-10 Laceration without foreign body, left lower leg, initial encounter Non-pressure chronic ulcer of other part of left lower leg with fat layer exposed Chronic venous hypertension (idiopathic) with ulcer of left lower extremity Chronic combined systolic (congestive) and diastolic (congestive) heart failure Essential (primary) hypertension Long term (current) use of anticoagulants Presence of cardiac pacemaker Plan 1. I am going to recommend that we continue with the wound care measures as before we will switch however to endoform instead of the [facility] due to the fact that the patient's wound is getting to the point where it is ready for something different I believe the [facility] was cut to get a little bit stuck and to be perfectly honest I feel  like that we need something to help fill in these last little areas which are going to be too kept open by the [facility] Blue alone. 2. I am also can recommend that we have the patient continue with the ABD pad and roll gauze to secure in place following and then Tubigrip applied as well this seems to be keeping her edema under good control. 3. I would also suggest she  continue to elevate her legs much as possible keeping this under control as can be good as far as swelling is concerned. We will see patient back for reevaluation in 1 week here in the clinic. If anything worsens or changes patient will contact our office for additional recommendations. Electronic Signature(s) Signed: 04/08/2022 10:30:27 AM By: Lenda Kelp PA-C Entered By: Lenda Kelp on 04/08/2022 10:30:27 -------------------------------------------------------------------------------- SuperBill Details Patient Name: Date of Service: [name], [name] 04/08/2022 Medical Record Number: [id number] Patient Account Number: [id number] Date of Birth/Sex: Treating RN: [birth year] (88 y.o. Esmeralda Links Primary Care Provider: Merita Norton Other Clinician: Referring Provider: Treating Provider/Extender: Dory Larsen in Treatment: 2 Diagnosis Coding ICD-10 Codes Code Description [phone] Laceration without foreign body, left lower leg, initial encounter L97.822 Non-pressure chronic ulcer of other part of left lower leg with fat layer exposed I87.312 Chronic venous hypertension (idiopathic) with ulcer of left lower extremity I50.42 Chronic combined systolic (congestive) and diastolic (congestive) heart failure I10 Essential (primary) hypertension Z79.01 Long term (current) use of anticoagulants Z95.0 Presence of cardiac pacemaker Facility Procedures Physician Procedures : CPT4 Code Description Modifier [phone] [id number] - WC PHYS LEVEL 3 - EST PT ICD-10 Diagnosis Description S81.812A Laceration without foreign  body, left lower leg, initial encounter L97.822 Non-pressure chronic ulcer of other part of left lower leg with fat  layer exposed I87.312 Chronic venous hypertension (idiopathic) with ulcer of left lower extremity I50.42 Chronic combined systolic (congestive) and diastolic (congestive) heart failure Quantity: 1 Electronic Signature(s) Signed: 04/08/2022 11:51:19 AM By: Angelina Pih Signed: 04/08/2022 4:35:29 PM By: Lenda Kelp PA-C Previous Signature: 04/08/2022 10:30:45 AM Version By: Lenda Kelp PA-C Entered By: Angelina Pih on 04/08/2022 11:51:18

## 2022-04-09 ENCOUNTER — Telehealth: Payer: Self-pay

## 2022-04-09 ENCOUNTER — Telehealth: Payer: Self-pay | Admitting: Family

## 2022-04-09 MED ORDER — TORSEMIDE 20 MG PO TABS: 20.0000 mg | ORAL_TABLET | Freq: Every day | ORAL | 3 refills | Status: DC

## 2022-04-09 NOTE — Telephone Encounter (Signed)
Copied from CRM [id number]. Topic: General - Other ?>> Apr 09, 2022  9:58 AM Gaetana Michaelis A wrote: ?Reason for CRM: The patient's daughter has been advised by the heart failure clinic to contact the patient's PCP and request orders for a urine check  ? ?The heart failure clinic believes that the patient may be experiencing a UTI  ? ?The patient's lab numbers have recently changed and an increase in irritability has been noticed  ? ?The patient's daughter has declined to schedule an office visit at the time of call with agent  ? ?Please contact further when possible ?

## 2022-04-09 NOTE — Telephone Encounter (Signed)
Spoke with patient's daughter, [name], regarding patient's BMP results obtained yesterday. Potassium is slightly low at 3.4 and renal function looks pretty good although is trending down from the last couple of months. GFR is 54 (has been >60) and creatinine is 0.97 (has been 0.6-0.8).  ? ?Yesterday had decreased her daily torsemide to 20mg  daily. Will continue this and use an additional 20mg  PRN for weight gain/ edema. Also suggested that a UA be obtained just to make sure she is not developing a UTI to cause the irritability that was mentioned at visit yesterday.  ? ?Daughter verbalized understanding.  ?

## 2022-04-10 ENCOUNTER — Encounter: Payer: Self-pay | Admitting: Physician Assistant

## 2022-04-10 ENCOUNTER — Ambulatory Visit: Payer: Medicare PPO | Admitting: Physician Assistant

## 2022-04-10 VITALS — BP 143/75 | HR 84 | Temp 97.7°F | Resp 16 | Wt 107.0 lb

## 2022-04-10 DIAGNOSIS — R319 Hematuria, unspecified: Secondary | ICD-10-CM | POA: Diagnosis not present

## 2022-04-10 DIAGNOSIS — R454 Irritability and anger: Secondary | ICD-10-CM

## 2022-04-10 DIAGNOSIS — R8290 Unspecified abnormal findings in urine: Secondary | ICD-10-CM

## 2022-04-10 LAB — POCT URINALYSIS DIPSTICK
Bilirubin, UA: NEGATIVE
Glucose, UA: POSITIVE — AB
Ketones, UA: NEGATIVE
Nitrite, UA: NEGATIVE
Protein, UA: NEGATIVE
Spec Grav, UA: 1.01 (ref 1.010–1.025)
Urobilinogen, UA: 0.2 E.U./dL
pH, UA: 7.5 (ref 5.0–8.0)

## 2022-04-10 NOTE — Telephone Encounter (Signed)
[name] patient's daughter called back to scheduled appt . Appt scheduled 04/10/22 at 2:40 pm.  ?

## 2022-04-10 NOTE — Telephone Encounter (Signed)
Lmtcb if daughter call [facility] nurse triage please have patient scheduled this afternoon in person visit with Debera Lat PA for urine screen. KW ?

## 2022-04-10 NOTE — Telephone Encounter (Signed)
Left a message on [name] Porter's VM to call back to sch. Appt for her mom to have urine checked.

## 2022-04-10 NOTE — Telephone Encounter (Signed)
Patient's daughter Hal Morales called on home and cell, left VM to return the call to the office. ?

## 2022-04-10 NOTE — Progress Notes (Signed)
?  ?            ?I,Roshena L Chambers,acting as a Neurosurgeon for [employer], PA-C.,have documented all relevant documentation on the behalf of Debera Lat, PA-C,as directed by  [employer], PA-C while in the presence of [employer], PA-C.  ? ?Established patient visit ? ? ?Patient: [name]   DOB: 12-24-1924   88 y.o. Female  MRN: [id number] ?Visit Date: 04/10/2022 ? ?Today's healthcare provider: Debera Lat, PA-C  ? ?Chief Complaint  ?Patient presents with  ? Fussy  ? ?Subjective  ?  ?HPI  ?Irritability: ?Patient's cardiologist recommend that she follow up with PCP to check for UTI due to irritability.  ? ?Medications: ?Outpatient Medications Prior to Visit  ?Medication Sig  ? amLODipine (NORVASC) 5 MG tablet Take 5 mg by mouth daily.  ? atorvastatin (LIPITOR) 10 MG tablet Take 10 mg by mouth daily.  ? calcium carbonate (OS-CAL - DOSED IN MG OF ELEMENTAL CALCIUM) 1250 (500 Ca) MG tablet Take 1 tablet by mouth 2 (two) times daily with a meal.  ? dipyridamole-aspirin (AGGRENOX) 200-25 MG 12hr capsule Take 1 capsule by mouth 2 (two) times daily.  ? empagliflozin (JARDIANCE) 10 MG TABS tablet Take 1 tablet (10 mg total) by mouth daily before breakfast.  ? losartan (COZAAR) 50 MG tablet Take 50 mg by mouth 2 (two) times daily.  ? metoprolol (TOPROL-XL) 200 MG 24 hr tablet Take 200 mg by mouth daily.  ? Multiple Vitamins-Minerals (PRESERVISION AREDS 2 PO) Take 1 tablet by mouth in the morning and at bedtime.  ? multivitamin-iron-minerals-folic acid (CENTRUM) chewable tablet Chew 1 tablet by mouth daily.  ? Omega-3 Fatty Acids (FISH OIL) 1000 MG CAPS Take 1 capsule by mouth daily.  ? potassium chloride (KLOR-CON) 10 MEQ tablet Take 10 mEq by mouth as needed. Only if she takes an additional dose of lasix  ? torsemide (DEMADEX) 20 MG tablet Take 1 tablet (20 mg total) by mouth daily. And additional 20mg  if needed  ? ?No facility-administered medications prior to visit.  ? ? ?Review of Systems  ?Constitutional:   Negative for appetite change, chills, fatigue and fever.  ?Respiratory:  Negative for chest tightness and shortness of breath.   ?Cardiovascular:  Negative for chest pain and palpitations.  ?Gastrointestinal:  Negative for abdominal pain, nausea and vomiting.  ?Neurological:  Negative for dizziness and weakness.  ?Psychiatric/Behavioral:  Positive for agitation.   ? ? ?  Objective  ?  ?BP (!) 143/75 (BP Location: Left Arm, Patient Position: Sitting, Cuff Size: Normal)   Pulse 84   Temp 97.7 ?F (36.5 ?C) (Oral)   Resp 16   Wt 107 lb (48.5 kg)   LMP  (LMP Unknown)   SpO2 100% Comment: room air  BMI 21.61 kg/m?  ? ? ?Physical Exam ?Vitals and nursing note reviewed.  ?Constitutional:   ?   Appearance: Normal appearance. She is normal weight.  ?HENT:  ?   Head: Normocephalic and atraumatic.  ?Cardiovascular:  ?   Rate and Rhythm: Normal rate and regular rhythm.  ?   Pulses: Normal pulses.  ?   Heart sounds: Normal heart sounds.  ?Pulmonary:  ?   Effort: Pulmonary effort is normal.  ?   Breath sounds: Normal breath sounds.  ?Abdominal:  ?   General: Abdomen is flat. Bowel sounds are normal.  ?   Palpations: Abdomen is soft.  ?Musculoskeletal:     ?   General: Tenderness (laceration, managed by wound clinic) present.  ?  Right lower leg: Edema (chronic) present.  ?   Left lower leg: Edema (chronic) present.  ?Neurological:  ?   Mental Status: She is alert and oriented to person, place, and time.  ?Psychiatric:     ?   Behavior: Behavior normal.     ?   Thought Content: Thought content normal.     ?   Judgment: Judgment normal.  ?  ? ?Results for orders placed or performed in visit on 04/10/22  ?POCT Urinalysis Dipstick  ?Result Value Ref Range  ? Color, UA yellow   ? Clarity, UA cloudy   ? Glucose, UA Positive (A) Negative  ? Bilirubin, UA negative   ? Ketones, UA negative   ? Spec Grav, UA 1.010 1.010 - 1.025  ? Blood, UA trace (non-hemolyzed)   ? pH, UA 7.5 5.0 - 8.0  ? Protein, UA Negative Negative  ?  Urobilinogen, UA 0.2 0.2 or 1.0 E.U./dL  ? Nitrite, UA negative   ? Leukocytes, UA Moderate (2+) (A) Negative  ? Appearance    ? Odor    ? ? Assessment & Plan  ?  ? ?1. Irritability ?Due to suspected UTI ?- POCT Urinalysis Dipstick positive for leukocytes, glucose and blood ? ?2. Abnormal urinalysis ?Hematuria ?Leukocytes in urine ?- Urine Culture ?- Urinalysis, microscopic only ?- Ambulatory referral to Urology ? ?3. Hematuria, unspecified type ?- Ambulatory referral to Urology ? ?4. Laceration of left lower leg ?Stable. ?Managed by wound clinic. ? ?FU in 2 weeks. ?   ?The patient was advised to call back or seek an in-person evaluation if the symptoms worsen or if the condition fails to improve as anticipated. ? ?I discussed the assessment and treatment plan with the patient. The patient was provided an opportunity to ask questions and all were answered. The patient agreed with the plan and demonstrated an understanding of the instructions. ? ?The entirety of the information documented in the History of Present Illness, Review of Systems and Physical Exam were personally obtained by me. Portions of this information were initially documented by the CMA and reviewed by me for thoroughness and accuracy.  ? ?Portions of this note were created using dictation software and may contain typographical errors.  ? ? ?Debera Lat, PA-C  ?[facility] ?[phone] (phone) ?[phone] (fax) ? ?[facility]  ?

## 2022-04-11 ENCOUNTER — Telehealth: Payer: Self-pay | Admitting: Physician Assistant

## 2022-04-11 ENCOUNTER — Other Ambulatory Visit: Payer: Self-pay | Admitting: Physician Assistant

## 2022-04-11 ENCOUNTER — Telehealth: Payer: Self-pay

## 2022-04-11 DIAGNOSIS — R3915 Urgency of urination: Secondary | ICD-10-CM

## 2022-04-11 DIAGNOSIS — R454 Irritability and anger: Secondary | ICD-10-CM

## 2022-04-11 DIAGNOSIS — R8290 Unspecified abnormal findings in urine: Secondary | ICD-10-CM

## 2022-04-11 LAB — URINALYSIS, MICROSCOPIC ONLY
Bacteria, UA: NONE SEEN
Casts: NONE SEEN /lpf
WBC, UA: >30 /hpf — AB (ref 0–5)

## 2022-04-11 MED ORDER — CEPHALEXIN 500 MG PO CAPS: 500.0000 mg | ORAL_CAPSULE | Freq: Two times a day (BID) | ORAL | 0 refills | Status: DC

## 2022-04-11 NOTE — Telephone Encounter (Signed)
Copied from CRM [id number]. Topic: General - Other ?>> Apr 11, 2022 10:10 AM Traci Sermon wrote: ?Reason for CRM: Pt called in stating the antibotic that was supposed to be sent over for the UTI has still not been received, and they are requesting that to be sent over, please advise. ?

## 2022-04-11 NOTE — Telephone Encounter (Signed)
Please let pt's daughter Kathryn know that Rx for abx is sent to her pharmacy ?We will contact you if we need to change management course; thank you ?

## 2022-04-11 NOTE — Progress Notes (Signed)
Please let pt's daughter [name] know that Rx for abx is sent to her pharmacy ?We will contact you if we need to change management course; thank you ?

## 2022-04-11 NOTE — Telephone Encounter (Signed)
LM informing daughter, [name]. ?

## 2022-04-11 NOTE — Telephone Encounter (Signed)
Rx sent and LM informing pt's daughter.  ?

## 2022-04-13 LAB — URINE CULTURE

## 2022-04-15 ENCOUNTER — Encounter: Payer: Medicare PPO | Admitting: Internal Medicine

## 2022-04-15 DIAGNOSIS — S81812A Laceration without foreign body, left lower leg, initial encounter: Secondary | ICD-10-CM | POA: Diagnosis not present

## 2022-04-15 NOTE — Progress Notes (Signed)
[place], [name] ([id number]) ?Visit Report for 04/15/2022 ?Arrival Information Details ?Patient Name: [name], [name] ?Date of Service: 04/15/2022 3:15 PM ?Medical Record Number: [id number] ?Patient Account Number: [id number] ?Date of Birth/Sex: 24-Jun-1925 (88 y.o. F) ?Treating RN: Donnamarie Poag ?Primary Care Aaric Dolph: Tally Joe Other Clinician: ?Referring Jermany Sundell: Candise Che ?Treating Cristino Degroff/Extender: Ricard Dillon ?Weeks in Treatment: 3 ?Visit Information History Since Last Visit ?Added or deleted any medications: No ?Patient Arrived: Gilford Rile ?Had a fall or experienced change in No ?Arrival Time: 15:31 ?activities of daily living that may affect ?Accompanied By: daughter ?risk of falls: ?Transfer Assistance: None ?Hospitalized since last visit: No ?Patient Identification Verified: Yes ?Has Dressing in Place as Prescribed: Yes ?Secondary Verification Process Completed: Yes ?Pain Present Now: Yes ?Patient Requires Transmission-Based No ?Precautions: ?Patient Has Alerts: Yes ?Patient Alerts: Patient on Blood ?Thinner ?Aggrenox ?NOT Diabetic ?03/19/22 ?Non-compressible >220 ?Legally Blind.Marland KitchenMarland KitchenPatient ?follows you down ?hallway ?[facility] ?Electronic Signature(s) ?Signed: 04/15/2022 4:00:36 PM By: Donnamarie Poag ?Entered ByDonnamarie Poag on 04/15/2022 16:00:36 ?[place], [name] ([id number]) ?-------------------------------------------------------------------------------- ?Clinic Level of Care Assessment Details ?Patient Name: [name] ?Date of Service: 04/15/2022 3:15 PM ?Medical Record Number: [id number] ?Patient Account Number: [id number] ?Date of Birth/Sex: October 01, 1925 (88 y.o. F) ?Treating RN: Donnamarie Poag ?Primary Care Kyndahl Jablon: Tally Joe Other Clinician: ?Referring Margues Filippini: Candise Che ?Treating Jabin Tapp/Extender: Ricard Dillon ?Weeks in Treatment: 3 ?Clinic Level of Care Assessment Items ?TOOL 4 Quantity Score ?[]  - Use when only an EandM is performed on FOLLOW-UP visit 0 ?ASSESSMENTS - Nursing Assessment /  Reassessment ?[]  - Reassessment of Co-morbidities (includes updates in patient status) 0 ?[]  - 0 ?Reassessment of Adherence to Treatment Plan ?ASSESSMENTS - Wound and Skin Assessment / Reassessment ?X - Simple Wound Assessment / Reassessment - one wound 1 5 ?[]  - 0 ?Complex Wound Assessment / Reassessment - multiple wounds ?[]  - 0 ?Dermatologic / Skin Assessment (not related to wound area) ?ASSESSMENTS - Focused Assessment ?[]  - Circumferential Edema Measurements - multi extremities 0 ?[]  - 0 ?Nutritional Assessment / Counseling / Intervention ?[]  - 0 ?Lower Extremity Assessment (monofilament, tuning fork, pulses) ?[]  - 0 ?Peripheral Arterial Disease Assessment (using hand held doppler) ?ASSESSMENTS - Ostomy and/or Continence Assessment and Care ?[]  - Incontinence Assessment and Management 0 ?[]  - 0 ?Ostomy Care Assessment and Management (repouching, etc.) ?PROCESS - Coordination of Care ?X - Simple Patient / Family Education for ongoing care 1 15 ?[]  - 0 ?Complex (extensive) Patient / Family Education for ongoing care ?[]  - 0 ?Staff obtains Consents, Records, Test Results / Process Orders ?[]  - 0 ?Staff telephones HHA, Nursing Homes / Clarify orders / etc ?[]  - 0 ?Routine Transfer to another Facility (non-emergent condition) ?[]  - 0 ?Routine Hospital Admission (non-emergent condition) ?[]  - 0 ?New Admissions / Biomedical engineer / Ordering NPWT, Apligraf, etc. ?[]  - 0 ?Emergency Hospital Admission (emergent condition) ?X- 1 10 ?Simple Discharge Coordination ?[]  - 0 ?Complex (extensive) Discharge Coordination ?PROCESS - Special Needs ?[]  - Pediatric / Minor Patient Management 0 ?[]  - 0 ?Isolation Patient Management ?[]  - 0 ?Hearing / Language / Visual special needs ?[]  - 0 ?Assessment of Community assistance (transportation, D/C planning, etc.) ?[]  - 0 ?Additional assistance / Altered mentation ?[]  - 0 ?Support Surface(s) Assessment (bed, cushion, seat, etc.) ?INTERVENTIONS - Wound Cleansing /  Measurement ?[place], [name] ([id number]) ?X- 1 5 ?Simple Wound Cleansing - one wound ?[]  - 0 ?Complex Wound Cleansing - multiple wounds ?X- 1 5 ?Wound Imaging (photographs - any number of wounds) ?[]  - 0 ?Wound Tracing (instead of  photographs) ?X- 1 5 ?Simple Wound Measurement - one wound ?[]  - 0 ?Complex Wound Measurement - multiple wounds ?INTERVENTIONS - Wound Dressings ?X - Small Wound Dressing one or multiple wounds 1 10 ?[]  - 0 ?Medium Wound Dressing one or multiple wounds ?[]  - 0 ?Large Wound Dressing one or multiple wounds ?X- 1 5 ?Application of Medications - topical ?[]  - 0 ?Application of Medications - injection ?INTERVENTIONS - Miscellaneous ?[]  - External ear exam 0 ?[]  - 0 ?Specimen Collection (cultures, biopsies, blood, body fluids, etc.) ?[]  - 0 ?Specimen(s) / Culture(s) sent or taken to Lab for analysis ?[]  - 0 ?Patient Transfer (multiple staff / Civil Service fast streamer / Similar devices) ?[]  - 0 ?Simple Staple / Suture removal (25 or less) ?[]  - 0 ?Complex Staple / Suture removal (26 or more) ?[]  - 0 ?Hypo / Hyperglycemic Management (close monitor of Blood Glucose) ?[]  - 0 ?Ankle / Brachial Index (ABI) - do not check if billed separately ?X- 1 5 ?Vital Signs ?Has the patient been seen at the hospital within the last three years: Yes ?Total Score: 65 ?Level Of Care: New/Established - Level ?2 ?Electronic Signature(s) ?Signed: 04/15/2022 4:56:25 PM By: Donnamarie Poag ?Entered ByDonnamarie Poag on 04/15/2022 16:15:43 ?[place], [name] ([id number]) ?-------------------------------------------------------------------------------- ?Encounter Discharge Information Details ?Patient Name: [name] ?Date of Service: 04/15/2022 3:15 PM ?Medical Record Number: [id number] ?Patient Account Number: [id number] ?Date of Birth/Sex: [birth year] (88 y.o. F) ?Treating RN: Donnamarie Poag ?Primary Care Carlota Philley: Tally Joe Other Clinician: ?Referring Rula [place]: Candise Che ?Treating Finnlee Silvernail/Extender: Ricard Dillon ?Weeks in Treatment:  3 ?Encounter Discharge Information Items ?Discharge Condition: Stable ?Ambulatory Status: Gilford Rile ?Discharge Destination: Home Health ?Telephoned: No ?Orders Sent: Yes ?Transportation: Private Auto ?Accompanied By: daughter ?Schedule Follow-up Appointment: Yes ?Clinical Summary of Care: ?Electronic Signature(s) ?Signed: 04/15/2022 4:56:25 PM By: Donnamarie Poag ?Entered ByDonnamarie Poag on 04/15/2022 16:17:16 ?[place], [name] ([id number]) ?-------------------------------------------------------------------------------- ?Lower Extremity Assessment Details ?Patient Name: GRAYCEN, VELASTEGUI ?Date of Service: 04/15/2022 3:15 PM ?Medical Record Number: [id number] ?Patient Account Number: [id number] ?Date of Birth/Sex: [birth year] (88 y.o. F) ?Treating RN: Donnamarie Poag ?Primary Care Janiesha Diehl: Tally Joe Other Clinician: ?Referring Areona Homer: Candise Che ?Treating Josephmichael Lisenbee/Extender: Ricard Dillon ?Weeks in Treatment: 3 ?Edema Assessment ?Assessed: [Left: Yes] [Right: No] ?[Left: Edema] [Right: :] ?Calf ?Left: Right: ?Point of Measurement: 30 cm From Medial Instep 34.3 cm ?Ankle ?Left: Right: ?Point of Measurement: 9 cm From Medial Instep 20.5 cm ?Knee To Floor ?Left: Right: ?From Medial Instep 37 cm ?Vascular Assessment ?Pulses: ?Dorsalis Pedis ?Palpable: [Left:Yes] ?Electronic Signature(s) ?Signed: 04/15/2022 4:56:25 PM By: Donnamarie Poag ?Entered ByDonnamarie Poag on 04/15/2022 15:41:35 ?[place], [name] ([id number]) ?-------------------------------------------------------------------------------- ?Multi Wound Chart Details ?Patient Name: ALIZAYAH, NEDLEY ?Date of Service: 04/15/2022 3:15 PM ?Medical Record Number: [id number] ?Patient Account Number: [id number] ?Date of Birth/Sex: 24-Aug-1925 (88 y.o. F) ?Treating RN: Donnamarie Poag ?Primary Care Jeryn Cerney: Tally Joe Other Clinician: ?Referring Amarii Amy: Candise Che ?Treating Charletta Voight/Extender: Ricard Dillon ?Weeks in Treatment: 3 ?Vital Signs ?Height(in): 59 ?Pulse(bpm): 84 ?Weight(lbs):  107 ?Blood Pressure(mmHg): 104/62 ?Body Mass Index(BMI): 21.6 ?Temperature(??F): 98.2 ?Respiratory Rate(breaths/min): ?Photos: [N/A:N/A] ?Wound Location: Left, Posterior Lower Leg N/A N/A ?Wounding Event: Trauma N/A N/A ?Primary Etiol

## 2022-04-16 NOTE — Progress Notes (Addendum)
[place], [name] ([id number]) [id number] Page 1 of 6 Visit Report for 04/15/2022 HPI Details Patient Name: Date of Service: Kylie, [name] 04/15/2022 3:15 PM Medical Record Number: [id number] Patient Account Number: [id number] Date of Birth/Sex: Treating RN: Dec 21, 1924 (88 y.o. [name] Primary Care Provider: Merita [name] Other Clinician: Referring Provider: Treating Provider/Extender: [name] in Treatment: 3 History of Present Illness HPI Description: 03-19-2022 upon evaluation today patient appears to be doing poorly in regard to the left posterior lower extremity ulcer. This is something that she actually struck on a car door getting out of the car and this was around January 18, 2022. She was going to urgent care due to her leg swelling when this occurred. Since that time she has had a tremendous amount of bleeding she also did have recommendations for medicated gauze to be used by urgent care. Initially she actually apparently had sutures placed over this area but then when the sutures were removed this dehisced to some degree. She was placed on Omnicef in the past there is no signs of infection right now but again that is definitely something we will need to keep a close eye on especially with the depth of the wound. The good news that she does not seem to be having any pain which is excellent. And again right now they have been using medicated gauze packing into the wound. She did have an x-ray on February 03, 2022 which was negative for any signs of bone abnormality which is great news. Patient does have a history of chronic venous insufficiency/hypertension with the lower extremity ulcer now at this point. 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 03-25-2022 upon evaluation today patient appears to be doing better in regard to the wound on  her leg. She has been tolerating the dressing changes without complication. Fortunately I do not see any signs of active infection locally or systemically at this time which is great news. 04-01-2022 upon evaluation today patient appears to be doing well with regard to her right leg. Fortunately there does not appear to be any evidence of active infection locally nor systemically which is great news and overall I am extremely pleased with where we stand today. I do believe that she is making progress there is still some need for sharp debridement at this point. 04-08-2022 upon evaluation today patient actually appears to be making excellent progress. I am actually very pleased with where we stand today. I do not see any signs of active infection locally or systemically which is great news. No fevers, chills, nausea, vomiting, or diarrhea. 5/8 using endoform which was started last week. Wound appears smaller with less depth. Electronic Signature(s) Signed: 04/16/2022 6:42:33 AM By: Kylie Najjar MD Entered By: [name] on 04/15/2022 16:15:13 -------------------------------------------------------------------------------- Physical Exam Details Patient Name: Date of Service: Kylie, [name] 04/15/2022 3:15 PM Medical Record Number: [id number] Patient Account Number: [id number] Date of Birth/Sex: Treating RN: 1925/09/05 (88 y.o. [name] Primary Care Provider: Merita [name] Other Clinician: Referring Provider: Treating Provider/Extender: [name] in Treatment: 3 Constitutional [name], [name] ([id number]) [id number] Page 2 of 6 Sitting or standing Blood Pressure is within target range for patient.. Pulse regular and within target range for patient.Marland Kitchen Respirations regular, non-labored and within target range.. Temperature is normal and within the target range for the patient.Marland Kitchen appears in no distress.  Notes Wound exam; the patient has a  wound that was initially traumatic in the setting of chronic venous insufficiency. Started on endoform last week with nice improvements. Dimensions including depth are better. No evidence of surrounding infection Electronic Signature(s) Signed: 04/16/2022 6:42:33 AM By: Kylie Najjar MD Entered By: [name] on 04/15/2022 16:16:03 -------------------------------------------------------------------------------- Physician Orders Details Patient Name: Date of Service: Kylie, [name] 04/15/2022 3:15 PM Medical Record Number: [id number] Patient Account Number: [id number] Date of Birth/Sex: Treating RN: Jun 16, 1925 (88 y.o. [name] Primary Care Provider: Merita [name] Other Clinician: Referring Provider: Treating Provider/Extender: [name] [name], [name] in Treatment: 3 Verbal / Phone Orders: No Diagnosis Coding Follow-up Appointments Return Appointment in 1 week. Home Health Home Health Company: - [facility] CONT[facility] Home Health for wound care. May utilize formulary equivalent dressing for wound treatment orders unless otherwise specified. Home Health Nurse may visit PRN to address patients wound care needs. Scheduled days for dressing changes to be completed; exception, patient has scheduled wound care visit that day. - Three times a week. Once in clinic and twice by homehealth. **Please direct any NON-WOUND related issues/requests for orders to patient's Primary Care Physician. **If current dressing causes regression in wound condition, may D/C ordered dressing product/s and apply Normal Saline Moist Dressing daily until next Wound Healing Center or Other MD appointment. **Notify Wound Healing Center of regression in wound condition at [phone]. Bathing/ Shower/ Hygiene May shower; gently cleanse wound with antibacterial soap, rinse and pat dry prior to dressing wounds Anesthetic (Use 'Patient Medications' Section for Anesthetic Order Entry) Lidocaine applied to  wound bed Edema Control - Lymphedema / Segmental Compressive Device / Other Tubigrip single layer applied. - D Elevate, Exercise Daily and A void Standing for Long Periods of Time. Elevate legs to the level of the heart and pump ankles as often as possible Elevate leg(s) parallel to the floor when sitting. Wound Treatment Wound #1 - Lower Leg Wound Laterality: Left, Posterior Cleanser: Soap and Water 3 x Per Week/30 Days Discharge Instructions: Gently cleanse wound with antibacterial soap, rinse and pat dry prior to dressing wounds Cleanser: Wound Cleanser (Home Health) 3 x Per Week/30 Days Discharge Instructions: Wash your hands with soap and water. Remove old dressing, discard into plastic bag and place into trash. Cleanse the wound with Wound Cleanser prior to applying a clean dressing using gauze sponges, not tissues or cotton balls. Do not scrub or use excessive force. Pat dry using gauze sponges, not tissue or cotton balls. Prim Dressing: Endoform 2x2 (in/in) 3 x Per Week/30 Days ary Discharge Instructions: Moisten-Apply Endoform as directed Pack dry and then moisten with saline Secondary Dressing: ABD Pad 5x9 (in/in) (Home Health) 3 x Per Week/30 Days Discharge Instructions: Cover with ABD pad [place], [name] ([id number]) [id number] Page 3 of 6 Secured With: [employer] or Non-Sterile 6-ply 4.5x4 (yd/yd) 3 x Per Week/30 Days Discharge Instructions: Apply Kerlix as directed toes to just below knee, make sure this is not to tight Secured With: Tubigrip Size E, 3.5x10 (in/yds) 3 x Per Week/30 Days Discharge Instructions: Apply 3 Tubigrip E 3-finger-widths below knee to base of toes to secure dressing and/or for swelling. Electronic Signature(s) Signed: 04/15/2022 4:56:25 PM By: Hansel Feinstein Signed: 04/16/2022 6:42:33 AM By: Kylie Najjar MD Entered By: Hansel Feinstein on 04/15/2022  16:17:41 -------------------------------------------------------------------------------- Problem List Details Patient Name: Date of Service: Kylie, [name] 04/15/2022 3:15 PM Medical Record Number: [id number] Patient Account Number: [id number] Date of Birth/Sex: Treating  RN: 1925/10/13 (88 y.o. 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/10/2022 No Yes I87.312 Chronic venous hypertension (idiopathic) with ulcer of left lower extremity 03/19/2022 No Yes I50.42 Chronic combined systolic (congestive) and diastolic (congestive) heart failure 03/19/2022 No Yes I10 Essential (primary) hypertension 03/19/2022 No Yes Z79.01 Long term (current) use of anticoagulants 03/19/2022 No Yes Z95.0 Presence of cardiac pacemaker 03/19/2022 No Yes Inactive Problems Resolved Problems [place], Zoye ([id number]) [id number] Page 4 of 6 Electronic Signature(s) Signed: 04/16/2022 6:42:33 AM By: Kylie Najjar MD Entered By: [name] on 04/15/2022 16:08:30 -------------------------------------------------------------------------------- Progress Note Details Patient Name: Date of Service: Kylie, [name] 04/15/2022 3:15 PM Medical Record Number: [id number] Patient Account Number: [id number] Date of Birth/Sex: Treating RN: [birth year] (88 y.o. [name] Primary Care Provider: Merita [name] Other Clinician: Referring Provider: Treating Provider/Extender: [name] in Treatment: 3 Subjective History of Present Illness (HPI) 03-19-2022 upon evaluation today patient appears  to be doing poorly in regard to the left posterior lower extremity ulcer. This is something that she actually struck on a car door getting out of the car and this was around January 18, 2022. She was going to urgent care due to her leg swelling when this occurred. Since that time she has had a tremendous amount of bleeding she also did have recommendations for medicated gauze to be used by urgent care. Initially she actually apparently had sutures placed over this area but then when the sutures were removed this dehisced to some degree. She was placed on Omnicef in the past there is no signs of infection right now but again that is definitely something we will need to keep a close eye on especially with the depth of the wound. The good news that she does not seem to be having any pain which is excellent. And again right now they have been using medicated gauze packing into the wound. She did have an x-ray on February 03, 2022 which was negative for any signs of bone abnormality which is great news. Patient does have a history of chronic venous insufficiency/hypertension with the lower extremity ulcer now at this point. 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 03-25-2022 upon evaluation today patient appears to be doing better in regard to the wound on her leg. She has been tolerating the dressing changes without complication. Fortunately I do not see any signs of active infection locally or systemically at this time which is great news. 04-01-2022 upon evaluation today patient appears to be doing well with regard to her right leg. Fortunately there does not appear to be any evidence of active infection locally nor systemically which is great news and overall I am extremely pleased with where we stand today. I do believe that she is making progress there is still some need for sharp debridement at this  point. 04-08-2022 upon evaluation today patient actually appears to be making excellent progress. I am actually very pleased with where we stand today. I do not see any signs of active infection locally or systemically which is great news. No fevers, chills,  nausea, vomiting, or diarrhea. 5/8 using endoform which was started last week. Wound appears smaller with less depth. Objective Constitutional Sitting or standing Blood Pressure is within target range for patient.. Pulse regular and within target range for patient.Marland Kitchen Respirations regular, non-labored and within target range.. Temperature is normal and within the target range for the patient.Marland Kitchen appears in no distress. Vitals Time Taken: 3:33 AM, Height: 59 in, Weight: 107 lbs, BMI: 21.6, Temperature: 98.2 F, Pulse: 84 bpm, Respiratory Rate: 16 breaths/min, Blood Pressure: 104/62 mmHg. General Notes: Wound exam; the patient has a wound that was initially traumatic in the setting of chronic venous insufficiency. Started on endoform last week with nice improvements. Dimensions including depth are better. No evidence of surrounding infection Integumentary (Hair, Skin) Wound #1 status is Open. Original cause of wound was Trauma. The date acquired was: 01/18/2022. The wound has been in treatment 3 weeks. The wound is located on the Left,Posterior Lower Leg. The wound measures 1.2cm length x 2cm width x 0.7cm depth; 1.885cm^2 area and 1.319cm^3 volume. There is Fat Layer (Subcutaneous Tissue) exposed. There is no tunneling or undermining noted. There is a medium amount of serous drainage noted. There is medium (34- 66%) red granulation within the wound bed. There is a medium (34-66%) amount of necrotic tissue within the wound bed including Eschar and Adherent Slough. [name], [name] ([id number]) [id number] Page 5 of 6 Assessment Active Problems ICD-10 Laceration without foreign body, left lower leg, initial  encounter Non-pressure chronic ulcer of other part of left lower leg with fat layer exposed Chronic venous hypertension (idiopathic) with ulcer of left lower extremity Chronic combined systolic (congestive) and diastolic (congestive) heart failure Essential (primary) hypertension Long term (current) use of anticoagulants Presence of cardiac pacemaker Plan Follow-up Appointments: Return Appointment in 1 week. Home Health: Home Health Company: - [facility] for wound care. May utilize formulary equivalent dressing for wound treatment orders unless otherwise specified. Home Health Nurse may visit PRN to address patients wound care needs. Scheduled days for dressing changes to be completed; exception, patient has scheduled wound care visit that day. - Three times a week. Once in clinic and twice by homehealth. **Please direct any NON-WOUND related issues/requests for orders to patient's Primary Care Physician. **If current dressing causes regression in wound condition, may D/C ordered dressing product/s and apply Normal Saline Moist Dressing daily until next Wound Healing Center or Other MD appointment. **Notify Wound Healing Center of regression in wound condition at [phone]. Bathing/ Shower/ Hygiene: May shower; gently cleanse wound with antibacterial soap, rinse and pat dry prior to dressing wounds Anesthetic (Use 'Patient Medications' Section for Anesthetic Order Entry): Lidocaine applied to wound bed Edema Control - Lymphedema / Segmental Compressive Device / Other: Tubigrip single layer applied. - E Elevate, Exercise Daily and Avoid Standing for Long Periods of Time. Elevate legs to the level of the heart and pump ankles as often as possible Elevate leg(s) parallel to the floor when sitting. WOUND #1: - Lower Leg Wound Laterality: Left, Posterior Cleanser: Soap and Water 3 x Per Week/30 Days Discharge Instructions: Gently cleanse wound with antibacterial soap,  rinse and pat dry prior to dressing wounds Cleanser: Wound Cleanser (Home Health) 3 x Per Week/30 Days Discharge Instructions: Wash your hands with soap and water. Remove old dressing, discard into plastic bag and place into trash. Cleanse the wound with Wound Cleanser prior to applying a clean dressing using gauze sponges, not tissues or cotton balls. Do not scrub or use excessive force.  Pat dry using gauze sponges, not tissue or cotton balls. Prim Dressing: Endoform 2x2 (in/in) 3 x Per Week/30 Days ary Discharge Instructions: Moisten-Apply Endoform as directed Pack dry and then moisten with saline Secondary Dressing: ABD Pad 5x9 (in/in) (Home Health) 3 x Per Week/30 Days Discharge Instructions: Cover with ABD pad Secured With: Kerlix Roll Sterile or Non-Sterile 6-ply 4.5x4 (yd/yd) 3 x Per Week/30 Days Discharge Instructions: Apply Kerlix as directed toes to just below knee, make sure this is not to tight Secured With: Tubigrip Size E, 3.5x10 (in/yds) 3 x Per Week/30 Days Discharge Instructions: Apply 3 Tubigrip E 3-finger-widths below knee to base of toes to secure dressing and/or for swelling. 1. #1 left posterior. We are using endoform. Nice improvement in the wound. Edema control is good no debridement is necessary. 2. No evidence of surrounding infection Electronic Signature(s) Signed: 04/16/2022 6:42:33 AM By: Kylie Najjar MD Entered By: [name] on 04/15/2022 16:17:01 -------------------------------------------------------------------------------- SuperBill Details Patient Name: Date of Service: [name] 04/15/2022 [name], [name] ([id number]) [id number] Page 6 of 6 Medical Record Number: [id number] Patient Account Number: [id number] Date of Birth/Sex: Treating RN: [birth year] (88 y.o. [name] Primary Care Provider: Merita [name] Other Clinician: Referring Provider: Treating Provider/Extender: [name] in  Treatment: 3 Diagnosis Coding ICD-10 Codes Code Description [phone] Laceration without foreign body, left lower leg, initial encounter L97.822 Non-pressure chronic ulcer of other part of left lower leg with fat layer exposed I87.312 Chronic venous hypertension (idiopathic) with ulcer of left lower extremity I50.42 Chronic combined systolic (congestive) and diastolic (congestive) heart failure I10 Essential (primary) hypertension Z79.01 Long term (current) use of anticoagulants Z95.0 Presence of cardiac pacemaker Facility Procedures : CPT4 Code: [id number] Description: [phone] - WOUND CARE VISIT-LEV 2 EST PT Modifier: Quantity: 1 Physician Procedures : CPT4 Code Description Modifier [id number] - WC PHYS LEVEL 3 - EST PT ICD-10 Diagnosis Description L97.822 Non-pressure chronic ulcer of other part of left lower leg with fat layer exposed I87.312 Chronic venous hypertension (idiopathic) with ulcer  of left lower extremity Quantity: 1 Electronic Signature(s) Signed: 04/16/2022 6:42:33 AM By: Kylie Najjar MD Entered By: [name] on 04/15/2022 16:17:35

## 2022-04-22 ENCOUNTER — Other Ambulatory Visit: Payer: Self-pay | Admitting: Physician Assistant

## 2022-04-22 ENCOUNTER — Encounter: Payer: Medicare PPO | Admitting: Physician Assistant

## 2022-04-22 DIAGNOSIS — S81812A Laceration without foreign body, left lower leg, initial encounter: Secondary | ICD-10-CM | POA: Diagnosis not present

## 2022-04-22 NOTE — Progress Notes (Addendum)
Hunter, [name] ([id number]) [id number] Page 1 of 8 Visit Report for 04/22/2022 Biopsy Details Patient Name: Date of Service: SARYIAH, BENCOSME 04/22/2022 3:15 PM Medical Record Number: [id number] Patient Account Number: [id number] Date of Birth/Sex: Treating RN: 1925-11-17 (88 y.o. [name], [name] Primary Care Provider: Merita Norton Other Clinician: Referring Provider: Treating Provider/Extender: Dory Larsen in Treatment: 4 Biopsy Performed for: Wound #1 Left, Posterior Lower Leg Location(s): Wound Bed Performed By: Physician Nelida Meuse., PA-C Tissue Punch: No Number of Specimens T aken: 1 Specimen Sent T Pathology: o Yes Level of Consciousness (Pre-procedure): Awake and Alert Pre-procedure Verification/Time-Out Taken: Yes - 16:00 Pain Control: Lidocaine Injectable Lidocaine Percent: 2% Instrument: Forceps, Scissors Bleeding: Minimum Hemostasis Achieved: Pressure Response to Treatment: Procedure was tolerated well Level of Consciousness (Post-procedure): Awake and Alert Post Procedure Diagnosis Same as Pre-procedure Electronic Signature(s) Signed: 04/22/2022 4:36:01 PM By: Hansel Feinstein Signed: 04/22/2022 5:23:27 PM By: Lenda Kelp PA-C Entered By: Hansel Feinstein on 04/22/2022 16:00:35 -------------------------------------------------------------------------------- Chief Complaint Document Details Patient Name: Date of Service: [name] [facility] 04/22/2022 3:15 PM Medical Record Number: [id number] Patient Account Number: [id number] Date of Birth/Sex: Treating RN: 08/01/1925 (88 y.o. [name], [name] Primary Care Provider: Merita Norton Other Clinician: Referring Provider: Treating Provider/Extender: Dory Larsen in Treatment: 4 Information Obtained from: Patient Chief Complaint Shiloh, [name] ([id number]) [id number] Page 2 of 8 Left LE ulcer Electronic Signature(s) Signed: 04/22/2022 3:44:54 PM  By: Lenda Kelp PA-C Entered By: Lenda Kelp on 04/22/2022 15:44:53 -------------------------------------------------------------------------------- HPI Details Patient Name: Date of Service: HA MIALEE, WEYMAN 04/22/2022 3:15 PM Medical Record Number: [id number] Patient Account Number: [id number] Date of Birth/Sex: Treating RN: [birth year] (88 y.o. [name], [name] Primary Care Provider: Merita Norton Other Clinician: Referring Provider: Treating Provider/Extender: Dory Larsen in Treatment: 4 History of Present Illness HPI Description: 03-19-2022 upon evaluation today patient appears to be doing poorly in regard to the left posterior lower extremity ulcer. This is something that she actually struck on a car door getting out of the car and this was around January 18, 2022. She was going to urgent care due to her leg swelling when this occurred. Since that time she has had a tremendous amount of bleeding she also did have recommendations for medicated gauze to be used by urgent care. Initially she actually apparently had sutures placed over this area but then when the sutures were removed this dehisced to some degree. She was placed on Omnicef in the past there is no signs of infection right now but again that is definitely something we will need to keep a close eye on especially with the depth of the wound. The good news that she does not seem to be having any pain which is excellent. And again right now they have been using medicated gauze packing into the wound. She did have an x-ray on February 03, 2022 which was negative for any signs of bone abnormality which is great news. Patient does have a history of chronic venous insufficiency/hypertension with the lower extremity ulcer now at this point. 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 03-25-2022 upon evaluation today patient appears  to be doing better in regard to the wound on her leg. She has been tolerating the dressing changes without complication. Fortunately I do not see any signs of active infection locally or systemically at this time which is great news. 04-01-2022 upon  evaluation today patient appears to be doing well with regard to her right leg. Fortunately there does not appear to be any evidence of active infection locally nor systemically which is great news and overall I am extremely pleased with where we stand today. I do believe that she is making progress there is still some need for sharp debridement at this point. 04-08-2022 upon evaluation today patient actually appears to be making excellent progress. I am actually very pleased with where we stand today. I do not see any signs of active infection locally or systemically which is great news. No fevers, chills, nausea, vomiting, or diarrhea. 5/8 using endoform which was started last week. Wound appears smaller with less depth. 04-22-2022 upon evaluation today patient appears to be doing well in regard to a large portion of her wound although there is one area that does have me concerned about the possibility of skin cancer. I do think that we need to take a sample biopsy to send for evaluation at this point. Patient is in agreement with that plan. Her family member who is with her states that she is also prone to skin cancers she has had 1 recently removed at the skin surgery center in [place]. They did give [place] the number to that location if indeed we need to end up making a referral. For now we will get a see how things go and what the biopsy shows initially. Electronic Signature(s) Signed: 04/22/2022 5:15:42 PM By: Lenda Kelp PA-C Entered By: Lenda Kelp on 04/22/2022 17:15:41 Bing Plume, [name] ([id number]) [id number] Page 3 of 8 -------------------------------------------------------------------------------- Physical  Exam Details Patient Name: Date of Service: LATISHIA, SUITT 04/22/2022 3:15 PM Medical Record Number: [id number] Patient Account Number: [id number] Date of Birth/Sex: Treating RN: 1924/12/17 (88 y.o. [name], [name] Primary Care Provider: Merita Norton Other Clinician: Referring Provider: Treating Provider/Extender: Dory Larsen in Treatment: 4 Constitutional Well-nourished and well-hydrated in no acute distress. Respiratory normal breathing without difficulty. Psychiatric this patient is able to make decisions and demonstrates good insight into disease process. Alert and Oriented x 3. pleasant and cooperative. Notes Upon inspection patient's wound for the most part appears to be doing much better. With that being said there was some abnormal finding seemingly noted where there is an area of raised that has me somewhat concerned about the possibility of skin cancer. I discussed this with the patient and her family member with her today. I am good to obtain a biopsy I used scissors and forceps to trim away a sample to send for evaluation. Electronic Signature(s) Signed: 04/22/2022 5:16:24 PM By: Lenda Kelp PA-C Entered By: Lenda Kelp on 04/22/2022 17:16:24 -------------------------------------------------------------------------------- Physician Orders Details Patient Name: Date of Service: QUENNA, DOEPKE 04/22/2022 3:15 PM Medical Record Number: [id number] Patient Account Number: [id number] Date of Birth/Sex: Treating RN: 1925-11-14 (88 y.o. [name], [name] Primary Care Provider: Merita Norton Other Clinician: Referring Provider: Treating Provider/Extender: Dory Larsen in Treatment: 4 Verbal / Phone Orders: No Diagnosis Coding ICD-10 Coding Code Description [phone] Laceration without foreign body, left lower leg, initial encounter L97.822 Non-pressure chronic ulcer of other part of left lower leg with fat layer exposed I87.312 Chronic venous  hypertension (idiopathic) with ulcer of left lower extremity I50.42 Chronic combined systolic (congestive) and diastolic (congestive) heart failure I10 Essential (primary) hypertension Z79.01 Long term (current) use of anticoagulants Z95.0 Presence of cardiac pacemaker Follow-up Appointments Return Appointment in 1 week. Home Health Home Health Company: -  [facility]-see changes to orders-also biopsy taken at site 04/22/22 [facility] for [facility] care. May utilize formulary equivalent dressing for wound treatment orders unless otherwise specified. Home Health Nurse may visit PRN to address patients wound care needs. - see updates Scheduled days for dressing changes to be completed; exception, patient has scheduled wound care visit that day. - Three times a week. Once in clinic and twice by homehealth. **Please direct any NON-WOUND related issues/requests for orders to patient's Primary Care Physician. **If current dressing causes Wordell, Cesiah ([id number]) [id number] Page 4 of 8 regression in wound condition, may D/C ordered dressing product/s and apply Normal Saline Moist Dressing daily until next Wound Healing Center or Other MD appointment. **Notify Wound Healing Center of regression in wound condition at [phone]. Bathing/ Shower/ Hygiene May shower; gently cleanse wound with antibacterial soap, rinse and pat dry prior to dressing wounds Anesthetic (Use 'Patient Medications' Section for Anesthetic Order Entry) Lidocaine applied to wound bed Edema Control - Lymphedema / Segmental Compressive Device / Other Tubigrip single layer applied. - D Elevate, Exercise Daily and A void Standing for Long Periods of Time. Elevate legs to the level of the heart and pump ankles as often as possible Elevate leg(s) parallel to the floor when sitting. Wound Treatment Wound #1 - Lower Leg Wound Laterality: Left, Posterior Cleanser: Soap and Water 3 x Per Week/30  Days Discharge Instructions: Gently cleanse wound with antibacterial soap, rinse and pat dry prior to dressing wounds Cleanser: Wound Cleanser (Home Health) 3 x Per Week/30 Days Discharge Instructions: Wash your hands with soap and water. Remove old dressing, discard into plastic bag and place into trash. Cleanse the wound with Wound Cleanser prior to applying a clean dressing using gauze sponges, not tissues or cotton balls. Do not scrub or use excessive force. Pat dry using gauze sponges, not tissue or cotton balls. Prim Dressing: Silvercel 4 1/4x 4 1/4 (in/in) 3 x Per Week/30 Days ary Discharge Instructions: Apply Silvercel 4 1/4x 4 1/4 (in/in) as instructed Secondary Dressing: ABD Pad 5x9 (in/in) (Home Health) 3 x Per Week/30 Days Discharge Instructions: Cover with ABD pad Secured With: Kerlix Roll Sterile or Non-Sterile 6-ply 4.5x4 (yd/yd) 3 x Per Week/30 Days Discharge Instructions: Apply Kerlix as directed toes to just below knee, make sure this is not to tight Secured With: Tubigrip Size E, 3.5x10 (in/yds) 3 x Per Week/30 Days Discharge Instructions: Apply 3 Tubigrip E 3-finger-widths below knee to base of toes to secure dressing and/or for swelling. Laboratory Bacteria identified in Tissue by Biopsy culture (MICRO) - (ICD10 L97.822 - Non-pressure chronic ulcer of other part of left lower leg with fat layer exposed) LOINC Code: 82956-2 Convenience Name: Biopsy specimen culture Electronic Signature(s) Signed: 04/22/2022 4:36:01 PM By: Hansel Feinstein Signed: 04/22/2022 5:23:27 PM By: Lenda Kelp PA-C Entered By: Hansel Feinstein on 04/22/2022 16:01:43 -------------------------------------------------------------------------------- Problem List Details Patient Name: Date of Service: SHERE, EISENHART 04/22/2022 3:15 PM Medical Record Number: [id number] Patient Account Number: [id number] Date of Birth/Sex: Treating RN: 1925-07-21 (88 y.o. [name], [name] Primary Care Provider: Merita Norton Other  Clinician: Referring Provider: Treating Provider/Extender: Dory Larsen in Treatment: 4 Active Problems [place], [name] ([id number]) [id number] Page 5 of 8 ICD-10 Encounter Code Description Active Date MDM Diagnosis S81.812A Laceration without foreign body, left lower leg, initial encounter 03/19/2022 No Yes L97.822 Non-pressure chronic ulcer of other part of left lower leg with fat layer exposed4/10/2022 No Yes I87.312 Chronic venous hypertension (idiopathic) with ulcer of left lower  extremity 03/19/2022 No Yes I50.42 Chronic combined systolic (congestive) and diastolic (congestive) heart failure 03/19/2022 No Yes I10 Essential (primary) hypertension 03/19/2022 No Yes Z79.01 Long term (current) use of anticoagulants 03/19/2022 No Yes Z95.0 Presence of cardiac pacemaker 03/19/2022 No Yes Inactive Problems Resolved Problems Electronic Signature(s) Signed: 04/22/2022 3:44:51 PM By: Lenda Kelp PA-C Entered By: Lenda Kelp on 04/22/2022 15:44:51 -------------------------------------------------------------------------------- Progress Note Details Patient Name: Date of Service: MARCY, SOOKDEO 04/22/2022 3:15 PM Medical Record Number: [id number] Patient Account Number: [id number] Date of Birth/Sex: Treating RN: 31-Jul-1925 (88 y.o. [name], [name] Primary Care Provider: Merita Norton Other Clinician: Referring Provider: Treating Provider/Extender: Dory Larsen in Treatment: 4 Subjective Chief Complaint Information obtained from Patient Left LE ulcer History of Present Illness (HPI) 03-19-2022 upon evaluation today patient appears to be doing poorly in regard to the left posterior lower extremity ulcer. This is something that she actually struck on a car door getting out of the car and this was around January 18, 2022. She was going to urgent care due to her leg swelling when this occurred. Since that time she has had a  tremendous amount of bleeding she also did have recommendations for medicated gauze to be used by urgent care. Initially she actually apparently had sutures placed over this area but then when the sutures were removed this dehisced to some degree. She was placed on Omnicef in the past there is no signs of infection right now but again that is definitely something we will need to keep a close eye on especially with the depth of the wound. The good news that she does not seem to be having any pain which is excellent. And again right now they have been using medicated gauze packing Knights, Takita ([id number]) [id number] Page 6 of 8 into the wound. She did have an x-ray on February 03, 2022 which was negative for any signs of bone abnormality which is great news. Patient does have a history of chronic venous insufficiency/hypertension with the lower extremity ulcer now at this point. 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 03-25-2022 upon evaluation today patient appears to be doing better in regard to the wound on her leg. She has been tolerating the dressing changes without complication. Fortunately I do not see any signs of active infection locally or systemically at this time which is great news. 04-01-2022 upon evaluation today patient appears to be doing well with regard to her right leg. Fortunately there does not appear to be any evidence of active infection locally nor systemically which is great news and overall I am extremely pleased with where we stand today. I do believe that she is making progress there is still some need for sharp debridement at this point. 04-08-2022 upon evaluation today patient actually appears to be making excellent progress. I am actually very pleased with where we stand today. I do not see any signs of active infection locally or systemically which is  great news. No fevers, chills, nausea, vomiting, or diarrhea. 5/8 using endoform which was started last week. Wound appears smaller with less depth. 04-22-2022 upon evaluation today patient appears to be doing well in regard to a large portion of her wound although there is one area that does have me concerned about the possibility of skin cancer. I do think that we need to take a sample biopsy to send for evaluation at this point. Patient is  in agreement with that plan. Her family member who is with her states that she is also prone to skin cancers she has had 1 recently removed at the skin surgery center in [place]. They did give [place] the number to that location if indeed we need to end up making a referral. For now we will get a see how things go and what the biopsy shows initially. Objective Constitutional Well-nourished and well-hydrated in no acute distress. Vitals Time Taken: 3:30 PM, Height: 59 in, Weight: 107 lbs, BMI: 21.6, Temperature: 97.8 F, Pulse: 87 bpm, Respiratory Rate: 16 breaths/min, Blood Pressure: 122/77 mmHg. Respiratory normal breathing without difficulty. Psychiatric this patient is able to make decisions and demonstrates good insight into disease process. Alert and Oriented x 3. pleasant and cooperative. General Notes: Upon inspection patient's wound for the most part appears to be doing much better. With that being said there was some abnormal finding seemingly noted where there is an area of raised that has me somewhat concerned about the possibility of skin cancer. I discussed this with the patient and her family member with her today. I am good to obtain a biopsy I used scissors and forceps to trim away a sample to send for evaluation. Integumentary (Hair, Skin) Wound #1 status is Open. Original cause of wound was Trauma. The date acquired was: 01/18/2022. The wound has been in treatment 4 weeks. The wound is located on the Left,Posterior Lower Leg. The wound  measures 2cm length x 2cm width x 0.5cm depth; 3.142cm^2 area and 1.571cm^3 volume. There is Fat Layer (Subcutaneous Tissue) exposed. There is no tunneling noted, however, there is undermining starting at 12:00 and ending at 12:00 with a maximum distance of 0.4cm. There is a medium amount of serosanguineous drainage noted. There is medium (34-66%) red granulation within the wound bed. There is a medium (34-66%) amount of necrotic tissue within the wound bed including Eschar and Adherent Slough. General Notes: box measurement; distal part of wound had the undemining Assessment Active Problems ICD-10 Laceration without foreign body, left lower leg, initial encounter Non-pressure chronic ulcer of other part of left lower leg with fat layer exposed Chronic venous hypertension (idiopathic) with ulcer of left lower extremity Chronic combined systolic (congestive) and diastolic (congestive) heart failure Essential (primary) hypertension Long term (current) use of anticoagulants Presence of cardiac pacemaker Procedures Wound #1 Pre-procedure diagnosis of Wound #1 is a Trauma, Other located on the Left, Posterior Lower Leg . There was a biopsy performed by Nelida Meuse., PA-C. There was a biopsy performed on Wound Bed. The skin was cleansed and prepped with anti-septic followed by pain control using Lidocaine Injectable: 2%. Tissue was removed at its base with the following instrument(s): Forceps and Scissors and sent to pathology. A Minimum amount of bleeding was controlled with Pressure. A time out was conducted at 16:00, prior to the start of the procedure. The procedure was tolerated well. Post procedure Diagnosis Wound #1: Same as Pre-Procedure Wiswell, [name] ([id number]) [id number] Page 7 of 8 Plan Follow-up Appointments: Return Appointment in 1 week. Home Health: Home Health Company: - [facility]-see changes to orders-also biopsy taken at site 04/22/22 [facility] for wound care. May utilize formulary equivalent dressing for wound treatment orders unless otherwise specified. Home Health Nurse may visit PRN to address patients wound care needs. - see updates Scheduled days for dressing changes to be completed; exception, patient has scheduled wound care visit that day. - Three times a week. Once in clinic and twice  by homehealth. **Please direct any NON-WOUND related issues/requests for orders to patient's Primary Care Physician. **If current dressing causes regression in wound condition, may D/C ordered dressing product/s and apply Normal Saline Moist Dressing daily until next Wound Healing Center or Other MD appointment. **Notify Wound Healing Center of regression in wound condition at [phone]. Bathing/ Shower/ Hygiene: May shower; gently cleanse wound with antibacterial soap, rinse and pat dry prior to dressing wounds Anesthetic (Use 'Patient Medications' Section for Anesthetic Order Entry): Lidocaine applied to wound bed Edema Control - Lymphedema / Segmental Compressive Device / Other: Tubigrip single layer applied. - D Elevate, Exercise Daily and Avoid Standing for Long Periods of Time. Elevate legs to the level of the heart and pump ankles as often as possible Elevate leg(s) parallel to the floor when sitting. Laboratory ordered were: Biopsy specimen culture WOUND #1: - Lower Leg Wound Laterality: Left, Posterior Cleanser: Soap and Water 3 x Per Week/30 Days Discharge Instructions: Gently cleanse wound with antibacterial soap, rinse and pat dry prior to dressing wounds Cleanser: Wound Cleanser (Home Health) 3 x Per Week/30 Days Discharge Instructions: Wash your hands with soap and water. Remove old dressing, discard into plastic bag and place into trash. Cleanse the wound with Wound Cleanser prior to applying a clean dressing using gauze sponges, not tissues or cotton balls. Do not scrub or use excessive force. Pat dry using gauze  sponges, not tissue or cotton balls. Prim Dressing: Silvercel 4 1/4x 4 1/4 (in/in) 3 x Per Week/30 Days ary Discharge Instructions: Apply Silvercel 4 1/4x 4 1/4 (in/in) as instructed Secondary Dressing: ABD Pad 5x9 (in/in) (Home Health) 3 x Per Week/30 Days Discharge Instructions: Cover with ABD pad Secured With: Kerlix Roll Sterile or Non-Sterile 6-ply 4.5x4 (yd/yd) 3 x Per Week/30 Days Discharge Instructions: Apply Kerlix as directed toes to just below knee, make sure this is not to tight Secured With: Tubigrip Size E, 3.5x10 (in/yds) 3 x Per Week/30 Days Discharge Instructions: Apply 3 Tubigrip E 3-finger-widths below knee to base of toes to secure dressing and/or for swelling. 1. I am good recommend currently that we switch to silver alginate dressing which I think is good to be the best way to go currently. 2. Also can recommend that we have the patient continue with a ABD pad to cover followed by roll gauze secured in place and then Tubigrip size E. 3. I would also suggest that if we do end up coming back positive for a skin cancer we refer her to the skin surgery center she would like to go to the person she went to before she was very pleased with her care. We will see patient back for reevaluation in 1 week here in the clinic. If anything worsens or changes patient will contact our office for additional recommendations. Electronic Signature(s) Signed: 04/22/2022 5:16:53 PM By: Lenda Kelp PA-C Entered By: Lenda Kelp on 04/22/2022 17:16:52 -------------------------------------------------------------------------------- SuperBill Details Patient Name: Date of Service: IRVA, LOSER 04/22/2022 Medical Record Number: [id number] Patient Account Number: [id number] Date of Birth/Sex: Treating RN: 1925/01/05 (88 y.o. [name], [name] Primary Care Provider: Merita Norton Other Clinician: Referring Provider: Treating Provider/Extender: Dory Larsen in Treatment:  4 [place], [name] ([id number]) [id number] Page 8 of 8 Diagnosis Coding ICD-10 Codes Code Description [phone] Laceration without foreign body, left lower leg, initial encounter L97.822 Non-pressure chronic ulcer of other part of left lower leg with fat layer exposed I87.312 Chronic venous hypertension (idiopathic) with ulcer of left lower extremity  I50.42 Chronic combined systolic (congestive) and diastolic (congestive) heart failure I10 Essential (primary) hypertension Z79.01 Long term (current) use of anticoagulants Z95.0 Presence of cardiac pacemaker Facility Procedures : CPT4 Code: [id number] Description: 11102-Tangential biopsy of skin (e.g., shave, scoop, saucerize, curette) single lesion ICD-10 Diagnosis Description L97.822 Non-pressure chronic ulcer of other part of left lower leg with fat layer exposed Modifier: Quantity: 1 Physician Procedures : CPT4 Code Description Modifier 11102 Tangential biopsy of skin (e.g., shave, scoop, saucerize, curette) single lesion ICD-10 Diagnosis Description L97.822 Non-pressure chronic ulcer of other part of left lower leg with fat layer exposed Quantity: 1 Electronic Signature(s) Signed: 04/22/2022 5:17:11 PM By: Lenda Kelp PA-C Previous Signature: 04/22/2022 4:36:01 PM Version By: Hansel Feinstein Entered By: Lenda Kelp on 04/22/2022 17:17:11

## 2022-04-22 NOTE — Progress Notes (Signed)
[place], [name] ([id number]) ?Visit Report for 04/22/2022 ?Arrival Information Details ?Patient Name: [name], [name] ?Date of Service: 04/22/2022 3:15 PM ?Medical Record Number: [id number] ?Patient Account Number: [id number] ?Date of Birth/Sex: 01-May-1925 (88 y.o. F) ?Treating RN: Donnamarie Poag ?Primary Care Jaymz Traywick: Tally Joe Other Clinician: ?Referring Lavora Brisbon: Candise Che ?Treating Niall Illes/Extender: Jeri Cos ?Weeks in Treatment: 4 ?Visit Information History Since Last Visit ?Added or deleted any medications: No ?Patient Arrived: Gilford Rile ?Had a fall or experienced change in No ?Arrival Time: 15:30 ?activities of daily living that may affect ?Accompanied By: daughter ?risk of falls: ?Transfer Assistance: None ?Hospitalized since last visit: No ?Patient Identification Verified: Yes ?Has Dressing in Place as Prescribed: Yes ?Secondary Verification Process Completed: Yes ?Has Compression in Place as Prescribed: Yes ?Patient Requires Transmission-Based No ?Pain Present Now: No ?Precautions: ?Patient Has Alerts: Yes ?Patient Alerts: Patient on Blood ?Thinner ?Aggrenox ?NOT Diabetic ?03/19/22 ?Non-compressible >220 ?Legally Blind.Marland KitchenMarland KitchenPatient ?follows you down ?hallway ?[facility] ?Electronic Signature(s) ?Signed: 04/22/2022 4:36:01 PM By: Donnamarie Poag ?Entered ByDonnamarie Poag on 04/22/2022 15:30:58 ?[place], [name] ([id number]) ?-------------------------------------------------------------------------------- ?Encounter Discharge Information Details ?Patient Name: Kylie, [name] ?Date of Service: 04/22/2022 3:15 PM ?Medical Record Number: [id number] ?Patient Account Number: [id number] ?Date of Birth/Sex: 01/20/1925 (88 y.o. F) ?Treating RN: Donnamarie Poag ?Primary Care Wenda Vanschaick: Tally Joe Other Clinician: ?Referring Samella Lucchetti: Candise Che ?Treating Vermell Madrid/Extender: Jeri Cos ?Weeks in Treatment: 4 ?Encounter Discharge Information Items Post Procedure Vitals ?Discharge Condition: Stable ?Temperature (?F): 97.8 ?Ambulatory  Status: Gilford Rile ?Pulse (bpm): 87 ?Discharge Destination: Home Health ?Respiratory Rate (breaths/min): 16 ?Telephoned: No ?Blood Pressure (mmHg): 122/77 ?Orders Sent: Yes ?Transportation: Private Auto ?Accompanied By: daughter ?Schedule Follow-up Appointment: Yes ?Clinical Summary of Care: ?Electronic Signature(s) ?Signed: 04/22/2022 4:36:01 PM By: Donnamarie Poag ?Entered ByDonnamarie Poag on 04/22/2022 16:12:41 ?[place], [name] ([id number]) ?-------------------------------------------------------------------------------- ?Lower Extremity Assessment Details ?Patient Name: Kylie, [name] ?Date of Service: 04/22/2022 3:15 PM ?Medical Record Number: [id number] ?Patient Account Number: [id number] ?Date of Birth/Sex: 01-02-1925 (88 y.o. F) ?Treating RN: Donnamarie Poag ?Primary Care Clorinda Wyble: Tally Joe Other Clinician: ?Referring Nori Winegar: Candise Che ?Treating Oriya Kettering/Extender: Jeri Cos ?Weeks in Treatment: 4 ?Edema Assessment ?Assessed: [Left: Yes] [Right: No] ?Edema: [Left: N] [Right: o] ?Calf ?Left: Right: ?Point of Measurement: 30 cm From Medial Instep 31.5 cm ?Ankle ?Left: Right: ?Point of Measurement: 9 cm From Medial Instep 23 cm ?Knee To Floor ?Left: Right: ?From Medial Instep 37 cm ?Vascular Assessment ?Pulses: ?Dorsalis Pedis ?Palpable: [Left:Yes] ?Electronic Signature(s) ?Signed: 04/22/2022 4:36:01 PM By: Donnamarie Poag ?Entered ByDonnamarie Poag on 04/22/2022 15:40:16 ?[place], [name] ([id number]) ?-------------------------------------------------------------------------------- ?Multi Wound Chart Details ?Patient Name: Kylie, [name] ?Date of Service: 04/22/2022 3:15 PM ?Medical Record Number: [id number] ?Patient Account Number: [id number] ?Date of Birth/Sex: 09-11-1925 (88 y.o. F) ?Treating RN: Donnamarie Poag ?Primary Care Deairra Halleck: Tally Joe Other Clinician: ?Referring Margarete Horace: Candise Che ?Treating Areen Trautner/Extender: Jeri Cos ?Weeks in Treatment: 4 ?Vital Signs ?Height(in): 59 ?Pulse(bpm): 87 ?Weight(lbs): 107 ?Blood  Pressure(mmHg): 122/77 ?Body Mass Index(BMI): 21.6 ?Temperature(??F): 97.8 ?Respiratory Rate(breaths/min): 16 ?Photos: [N/A:N/A] ?Wound Location: Left, Posterior Lower Leg N/A N/A ?Wounding Event: Trauma N/A N/A ?Primary Etiology: Trauma, Other N/A N/A ?Comorbid History: Congestive Heart Failure, N/A N/A ?Hypertension ?Date Acquired: 01/18/2022 N/A N/A ?Weeks of Treatment: 4 N/A N/A ?Wound Status: Open N/A N/A ?Wound Recurrence: No N/A N/A ?Clustered Wound: Yes N/A N/A ?Measurements L x W x D (cm) 2x2x0.5 N/A N/A ?Area (cm?) : 3.142 N/A N/A ?Volume (cm?) : 1.571 N/A N/A ?% Reduction in Area: 74.60% N/A N/A ?% Reduction in Volume: 95.90% N/A N/A ?Starting Position 1 (o'clock): 9 ?  Ending Position 1 (o'clock): 9 ?Maximum Distance 1 (cm): 0.4 ?Undermining: Yes N/A N/A ?Classification: Full Thickness Without Exposed N/A N/A ?Support Structures ?Exudate Amount: Medium N/A N/A ?Exudate Type: Serous N/A N/A ?Exudate Color: amber N/A N/A ?Granulation Amount: Medium (34-66%) N/A N/A ?Granulation Quality: Red N/A N/A ?Necrotic Amount: Medium (34-66%) N/A N/A ?Necrotic Tissue: Eschar, Adherent Slough N/A N/A ?Exposed Structures: ?Fat Layer (Subcutaneous Tissue): N/A N/A ?Yes ?Fascia: No ?Tendon: No ?Muscle: No ?Joint: No ?Bone: No ?Epithelialization: None N/A N/A ?Assessment Notes: box measurement N/A N/A ?Treatment Notes ?[place], [name] ([id number]) ?Electronic Signature(s) ?Signed: 04/22/2022 4:36:01 PM By: Donnamarie Poag ?Entered ByDonnamarie Poag on 04/22/2022 15:47:48 ?[place], [name] ([id number]) ?-------------------------------------------------------------------------------- ?Multi-Disciplinary Care Plan Details ?Patient Name: Kylie, [name] ?Date of Service: 04/22/2022 3:15 PM ?Medical Record Number: [id number] ?Patient Account Number: [id number] ?Date of Birth/Sex: 09-15-1925 (88 y.o. F) ?Treating RN: Donnamarie Poag ?Primary Care Tyanne Derocher: Tally Joe Other Clinician: ?Referring Emmet Messer: Candise Che ?Treating Vallery Mcdade/Extender:  Jeri Cos ?Weeks in Treatment: 4 ?Active Inactive ?Necrotic Tissue ?Nursing Diagnoses: ?Impaired tissue integrity related to necrotic/devitalized tissue ?Knowledge deficit related to management of necrotic/devitalized tissue ?Goals: ?Necrotic/devitalized tissue will be minimized in the wound bed ?Date Initiated: 03/19/2022 ?Target Resolution Date: 03/19/2022 ?Goal Status: Active ?Patient/caregiver will verbalize understanding of reason and process for debridement of necrotic tissue ?Date Initiated: 03/19/2022 ?Date Inactivated: 04/15/2022 ?Target Resolution Date: 03/19/2022 ?Goal Status: Met ?Interventions: ?Assess patient pain level pre-, during and post procedure and prior to discharge ?Provide education on necrotic tissue and debridement process ?Treatment Activities: ?Apply topical anesthetic as ordered : 03/19/2022 ?Excisional debridement : 03/19/2022 ?Notes: ?Wound/Skin Impairment ?Nursing Diagnoses: ?Impaired tissue integrity ?Goals: ?Patient/caregiver will verbalize understanding of skin care regimen ?Date Initiated: 03/19/2022 ?Date Inactivated: 04/15/2022 ?Target Resolution Date: 03/19/2022 ?Goal Status: Met ?Ulcer/skin breakdown will have a volume reduction of 30% by week 4 ?Date Initiated: 03/19/2022 ?Date Inactivated: 04/15/2022 ?Target Resolution Date: 04/16/2022 ?Goal Status: Met ?Ulcer/skin breakdown will have a volume reduction of 50% by week 8 ?Date Initiated: 03/19/2022 ?Target Resolution Date: 05/14/2022 ?Goal Status: Active ?Ulcer/skin breakdown will have a volume reduction of 80% by week 12 ?Date Initiated: 03/19/2022 ?Target Resolution Date: 06/11/2022 ?Goal Status: Active ?Ulcer/skin breakdown will heal within 14 weeks ?Date Initiated: 03/19/2022 ?Target Resolution Date: 06/25/2022 ?Goal Status: Active ?Interventions: ?Assess patient/caregiver ability to obtain necessary supplies ?Assess patient/caregiver ability to perform ulcer/skin care regimen upon admission and as needed ?Assess ulceration(s) every  visit ?Provide education on ulcer and skin care ?Notes: ?[place], [name] ([id number]) ?Electronic Signature(s) ?Signed: 04/22/2022 4:36:01 PM By: Donnamarie Poag ?Entered ByDonnamarie Poag on 04/22/2022 15:46:37 ?[place], Zanylah [phone]

## 2022-04-24 ENCOUNTER — Ambulatory Visit: Payer: Medicare PPO | Admitting: Urology

## 2022-04-24 ENCOUNTER — Encounter: Payer: Self-pay | Admitting: Urology

## 2022-04-24 VITALS — BP 127/77 | HR 87 | Ht 59.0 in | Wt 109.0 lb

## 2022-04-24 DIAGNOSIS — R310 Gross hematuria: Secondary | ICD-10-CM

## 2022-04-24 LAB — SURGICAL PATHOLOGY

## 2022-04-24 NOTE — Progress Notes (Signed)
? ?04/24/2022 ?2:58 PM  ? ?[name] ?1925/04/12 ?[id number] ? ?Referring provider: Debera Lat, PA-C ?[address] ?[place],  [place] [address] ? ?Chief Complaint  ?Patient presents with  ? Hematuria  ? ? ?HPI: ?[name] is a 88 y.o. female referred for hematuria.  She presents today with her daughter. ? ?Was seen [facility] 04/10/2022 for irritability which daughter states was very out of character for her ?Urinalysis was checked which showed >30 WBC.  No significant RBCs on microscopy ?She was treated with empiric antibiotics and her symptoms resolved ?Was referred for hematuria ?Denies gross hematuria ?No history recurrent UTI ?Does have chronic urinary incontinence and wears diapers ?No complaints today ? ? ?PMH: ?Past Medical History:  ?Diagnosis Date  ? Arrhythmia   ? atrial fibrillation  ? Congestive heart failure (CHF) (HCC)   ? Hypertension   ? Pacemaker 2015  ? ? ?Surgical History: ?Past Surgical History:  ?Procedure Laterality Date  ? ABDOMINAL HYSTERECTOMY    ? EYE SURGERY    ? MOHS SURGERY  2023  ? PACEMAKER PLACEMENT N/A 2007  ? 2017  ? ? ?Home Medications:  ?Allergies as of 04/24/2022   ? ?   Reactions  ? Moxifloxacin Itching, Swelling  ? Swelling and itching of the eyes.  ? Tobramycin Itching, Swelling  ? Swelling and itching of the eyelids.  ? Codeine   ? Other reaction(s): Hallucination  ? ?  ? ?  ?Medication List  ?  ? ?  ? Accurate as of Apr 24, 2022  2:58 PM. If you have any questions, ask your nurse or doctor.  ?  ?  ? ?  ? ?STOP taking these medications   ? ?cephALEXin 500 MG capsule ?Commonly known as: KEFLEX ?  ? ?  ? ?TAKE these medications   ? ?amLODipine 5 MG tablet ?Commonly known as: NORVASC ?Take 5 mg by mouth daily. ?  ?atorvastatin 10 MG tablet ?Commonly known as: LIPITOR ?Take 10 mg by mouth daily. ?  ?calcium carbonate 1250 (500 Ca) MG tablet ?Commonly known as: OS-CAL - dosed in mg of elemental calcium ?Take 1 tablet by mouth 2 (two) times daily with a  meal. ?  ?dipyridamole-aspirin 200-25 MG 12hr capsule ?Commonly known as: AGGRENOX ?Take 1 capsule by mouth 2 (two) times daily. ?  ?empagliflozin 10 MG Tabs tablet ?Commonly known as: Jardiance ?Take 1 tablet (10 mg total) by mouth daily before breakfast. ?  ?Fish Oil 1000 MG Caps ?Take 1 capsule by mouth daily. ?  ?losartan 50 MG tablet ?Commonly known as: COZAAR ?Take 50 mg by mouth 2 (two) times daily. ?  ?metoprolol 200 MG 24 hr tablet ?Commonly known as: TOPROL-XL ?Take 200 mg by mouth daily. ?  ?multivitamin-iron-minerals-folic acid chewable tablet ?Chew 1 tablet by mouth daily. ?  ?PRESERVISION AREDS 2 PO ?Take 1 tablet by mouth in the morning and at bedtime. ?  ?potassium chloride 10 MEQ tablet ?Commonly known as: KLOR-CON ?Take 10 mEq by mouth as needed. Only if she takes an additional dose of lasix ?  ?torsemide 20 MG tablet ?Commonly known as: DEMADEX ?Take 1 tablet (20 mg total) by mouth daily. And additional 20mg  if needed ?  ? ?  ? ? ?Allergies:  ?Allergies  ?Allergen Reactions  ? Moxifloxacin Itching and Swelling  ?  Swelling and itching of the eyes.  ? Tobramycin Itching and Swelling  ?  Swelling and itching of the eyelids.  ? Codeine   ?  Other  reaction(s): Hallucination  ? ? ?Family History: ?Family History  ?Problem Relation Age of Onset  ? Heart failure Mother   ? Heart attack Father   ? Heart attack Daughter   ? ? ?Social History:  reports that she has never smoked. She has never used smokeless tobacco. She reports that she does not currently use alcohol. She reports that she does not use drugs. ? ? ?Physical Exam: ?BP 127/77   Pulse 87   Ht 4\' 11"  (1.499 m)   Wt 109 lb (49.4 kg)   LMP  (LMP Unknown)   BMI 22.02 kg/m?   ?Constitutional:  Alert and oriented, No acute distress. ?HEENT: [place] AT, moist mucus membranes.  Trachea midline, no masses. ?Cardiovascular: No clubbing, cyanosis, or edema. ?Respiratory: Normal respiratory effort, no increased work of breathing. ?Psychiatric: Normal mood  and affect. ? ?Laboratory Data: ? ?Urinalysis ?Dipstick trace glucose ?Microscopy negative ? ? ?Assessment & Plan:   ?Recent urinalysis had significant pyuria and although urine culture showed <10,000 CFU per mL her symptoms resolved with antibiotic and follow-up urinalysis today is normal ?She had no significant hematuria on microscopy and no further evaluation is needed ?Follow-up as needed for recurrent UTI symptoms ? ? ? , MD ? ?[facility] ?[address] ?[place], [place] ?[phone] ?

## 2022-04-25 LAB — URINALYSIS, COMPLETE
Bilirubin, UA: NEGATIVE
Ketones, UA: NEGATIVE
Leukocytes,UA: NEGATIVE
Nitrite, UA: NEGATIVE
Protein,UA: NEGATIVE
RBC, UA: NEGATIVE
Specific Gravity, UA: 1.015 (ref 1.005–1.030)
Urobilinogen, Ur: 0.2 mg/dL (ref 0.2–1.0)
pH, UA: 7 (ref 5.0–7.5)

## 2022-04-25 LAB — MICROSCOPIC EXAMINATION
Bacteria, UA: NONE SEEN
Epithelial Cells (non renal): NONE SEEN /hpf (ref 0–10)

## 2022-04-29 ENCOUNTER — Encounter: Payer: Medicare PPO | Admitting: Physician Assistant

## 2022-04-29 DIAGNOSIS — S81812A Laceration without foreign body, left lower leg, initial encounter: Secondary | ICD-10-CM | POA: Diagnosis not present

## 2022-04-29 NOTE — Progress Notes (Addendum)
[name], [name] ([id number]) [id number] Page 1 of 7 Visit Report for 04/29/2022 Chief Complaint Document Details Patient Name: Date of Service: [name], [name] 04/29/2022 3:15 PM Medical Record Number: [id number] Patient Account Number: [id number] Date of Birth/Sex: Treating RN: August 16, 1925 (88 y.o. [name] Primary Care Provider: Merita Norton Other Clinician: Referring Provider: Treating Provider/Extender: Dory Larsen in Treatment: 5 Information Obtained from: Patient Chief Complaint Left LE ulcer Electronic Signature(s) Signed: 04/29/2022 3:19:36 PM By: Lenda Kelp PA-C Entered By: Lenda Kelp on 04/29/2022 15:19:36 -------------------------------------------------------------------------------- HPI Details Patient Name: Date of Service: [name], [name] 04/29/2022 3:15 PM Medical Record Number: [id number] Patient Account Number: [id number] Date of Birth/Sex: Treating RN: Oct 18, 1925 (88 y.o. [name] Primary Care Provider: Merita Norton Other Clinician: Referring Provider: Treating Provider/Extender: Dory Larsen in Treatment: 5 History of Present Illness HPI Description: 03-19-2022 upon evaluation today patient appears to be doing poorly in regard to the left posterior lower extremity ulcer. This is something that she actually struck on a car door getting out of the car and this was around January 18, 2022. She was going to urgent care due to her leg swelling when this occurred. Since that time she has had a tremendous amount of bleeding she also did have recommendations for medicated gauze to be used by urgent care. Initially she actually apparently had sutures placed over this area but then when the sutures were removed this dehisced to some degree. She was placed on Omnicef in the past there is no signs of infection right now but again that is definitely something we will need to keep a close eye on  especially with the depth of the wound. The good news that she does not seem to be having any pain which is excellent. And again right now they have been using medicated gauze packing into the wound. She did have an x-ray on February 03, 2022 which was negative for any signs of bone abnormality which is great news. Patient does have a history of chronic venous insufficiency/hypertension with the lower extremity ulcer now at this point. 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 03-25-2022 upon evaluation today patient appears to be doing better in regard to the wound on her leg. She has been tolerating the dressing changes without complication. Fortunately I do not see any signs of active infection locally or systemically at this time which is great news. 04-01-2022 upon evaluation today patient appears to be doing well with regard to her right leg. Fortunately there does not appear to be any evidence of active infection locally nor systemically which is great news and overall I am extremely pleased with where we stand today. I do believe that she is making progress there is still some need for sharp debridement at this point. [place], [name] ([id number]) [id number] Page 2 of 7 04-08-2022 upon evaluation today patient actually appears to be making excellent progress. I am actually very pleased with where we stand today. I do not see any signs of active infection locally or systemically which is great news. No fevers, chills, nausea, vomiting, or diarrhea. 5/8 using endoform which was started last week. Wound appears smaller with less depth. 04-22-2022 upon evaluation today patient appears to be doing well in regard to a large portion of her wound although there is one area that does have me concerned about the possibility of skin cancer. I do think  that we need to take a sample biopsy to send  for evaluation at this point. Patient is in agreement with that plan. Her family member who is with her states that she is also prone to skin cancers she has had 1 recently removed at the [name] in [place]. They did give [place] the number to that location if indeed we need to end up making a referral. For now we will get a see how things go and what the biopsy shows initially. 04-29-2022 upon evaluation today patient appears to be doing well currently in regard to her wound in fact it actually appears to be a lot better especially after last week's biopsy. Fortunately there does not appear to be any signs of active infection locally or systemically at this time which is great news. No fevers, chills, nausea, vomiting, or diarrhea. Electronic Signature(s) Signed: 04/29/2022 4:20:31 PM By: Lenda Kelp PA-C Entered By: Lenda Kelp on 04/29/2022 16:20:31 -------------------------------------------------------------------------------- Physical Exam Details Patient Name: Date of Service: [name], [name] 04/29/2022 3:15 PM Medical Record Number: [id number] Patient Account Number: [id number] Date of Birth/Sex: Treating RN: August 25, 1925 (88 y.o. [name] Primary Care Provider: Merita Norton Other Clinician: Referring Provider: Treating Provider/Extender: Dory Larsen in Treatment: 5 Constitutional Well-nourished and well-hydrated in no acute distress. Respiratory normal breathing without difficulty. Psychiatric this patient is able to make decisions and demonstrates good insight into disease process. Alert and Oriented x 3. pleasant and cooperative. Notes Upon inspection patient's wound bed showed evidence of good granulation and epithelization at this point. Fortunately I see no signs of anything worsening and overall I do believe that we are headed in the right direction here. Electronic Signature(s) Signed: 04/29/2022 4:20:45 PM By: Lenda Kelp  PA-C Entered By: Lenda Kelp on 04/29/2022 16:20:45 -------------------------------------------------------------------------------- Physician Orders Details Patient Name: Date of Service: [name] 04/29/2022 3:15 PM Medical Record Number: [id number] Patient Account Number: [id number] [name], [name] ([id number]) [id number] Page 3 of 7 Date of Birth/Sex: Treating RN: 07/02/1925 (88 y.o. [name] Primary Care Provider: Other Clinician: Merita Norton Referring Provider: Treating Provider/Extender: Dory Larsen in Treatment: 5 Verbal / Phone Orders: No Diagnosis Coding ICD-10 Coding Code Description [phone] Laceration without foreign body, left lower leg, initial encounter L97.822 Non-pressure chronic ulcer of other part of left lower leg with fat layer exposed I87.312 Chronic venous hypertension (idiopathic) with ulcer of left lower extremity I50.42 Chronic combined systolic (congestive) and diastolic (congestive) heart failure I10 Essential (primary) hypertension Z79.01 Long term (current) use of anticoagulants Z95.0 Presence of cardiac pacemaker Follow-up Appointments Return Appointment in 1 week. Home Health Home Health Company: - [facility]-see changes to orders-also biopsy taken at site 04/22/22 [name] for wound care. May utilize formulary equivalent dressing for wound treatment orders unless otherwise specified. Home Health Nurse may visit PRN to address patients wound care needs. - see updates Scheduled days for dressing changes to be completed; exception, patient has scheduled wound care visit that day. - Three times a week. Once in clinic and twice by homehealth. **Please direct any NON-WOUND related issues/requests for orders to patient's Primary Care Physician. **If current dressing causes regression in wound condition, may D/C ordered dressing product/s and apply Normal Saline Moist Dressing daily  until next [name] or Other MD appointment. **Notify [name] of regression in wound condition at [phone]. Bathing/ Shower/ Hygiene May shower; gently cleanse wound with antibacterial soap, rinse and pat dry  prior to dressing wounds Anesthetic (Use 'Patient Medications' Section for Anesthetic Order Entry) Lidocaine applied to wound bed Edema Control - Lymphedema / Segmental Compressive Device / Other Tubigrip single layer applied. - D Elevate, Exercise Daily and A void Standing for Long Periods of Time. Elevate legs to the level of the heart and pump ankles as often as possible Elevate leg(s) parallel to the floor when sitting. Wound Treatment Wound #1 - Lower Leg Wound Laterality: Left, Posterior Cleanser: Soap and Water 3 x Per Week/30 Days Discharge Instructions: Gently cleanse wound with antibacterial soap, rinse and pat dry prior to dressing wounds Cleanser: Wound Cleanser (Home Health) 3 x Per Week/30 Days Discharge Instructions: Wash your hands with soap and water. Remove old dressing, discard into plastic bag and place into trash. Cleanse the wound with Wound Cleanser prior to applying a clean dressing using gauze sponges, not tissues or cotton balls. Do not scrub or use excessive force. Pat dry using gauze sponges, not tissue or cotton balls. Prim Dressing: Silvercel 4 1/4x 4 1/4 (in/in) 3 x Per Week/30 Days ary Discharge Instructions: Apply Silvercel 4 1/4x 4 1/4 (in/in) as instructed Secondary Dressing: ABD Pad 5x9 (in/in) (Home Health) 3 x Per Week/30 Days Discharge Instructions: Cover with ABD pad Secured With: Kerlix Roll Sterile or Non-Sterile 6-ply 4.5x4 (yd/yd) 3 x Per Week/30 Days Discharge Instructions: Apply Kerlix as directed toes to just below knee, make sure this is not to tight Secured With: Tubigrip Size E, 3.5x10 (in/yds) 3 x Per Week/30 Days Discharge Instructions: Apply 3 Tubigrip E 3-finger-widths below knee to base of toes to  secure dressing and/or for swelling. Electronic Signature(s) Signed: 04/29/2022 4:26:20 PM By: Lenda Kelp PA-C Signed: 04/29/2022 4:37:47 PM By: Angelina Pih Entered By: Angelina Pih on 04/29/2022 16:10:13 [name], [name] ([id number]) [id number] Page 4 of 7 -------------------------------------------------------------------------------- Problem List Details Patient Name: Date of Service: [name], [name] 04/29/2022 3:15 PM Medical Record Number: [id number] Patient Account Number: [id number] Date of Birth/Sex: Treating RN: Feb 03, 1925 (88 y.o. 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, including hip 04/29/2022 No Yes I50.42 Chronic combined systolic (congestive) and diastolic (congestive) heart failure 03/19/2022 No Yes I10 Essential (primary) hypertension 03/19/2022 No Yes Z79.01 Long term (current) use of anticoagulants 03/19/2022 No Yes Z95.0 Presence of cardiac pacemaker 03/19/2022 No Yes Inactive Problems Resolved Problems Electronic Signature(s) Signed: 04/29/2022 4:23:42 PM By: Lenda Kelp PA-C Previous Signature: 04/29/2022 3:19:14 PM Version By: Lenda Kelp PA-C Entered By: Lenda Kelp on 04/29/2022 16:23:41 [name], [name] ([id number]) [id number] Page 5 of 7 -------------------------------------------------------------------------------- Progress Note  Details Patient Name: Date of Service: [name], [name] 04/29/2022 3:15 PM Medical Record Number: [id number] Patient Account Number: [id number] Date of Birth/Sex: Treating RN: 1925/04/25 (88 y.o. [name] Primary Care Provider: Merita Norton Other Clinician: Referring Provider: Treating Provider/Extender: Dory Larsen in Treatment: 5 Subjective Chief Complaint Information obtained from Patient Left LE ulcer History of Present Illness (HPI) 03-19-2022 upon evaluation today patient appears to be doing poorly in regard to the left  posterior lower extremity ulcer. This is something that she actually struck on a car door getting out of the car and this was around January 18, 2022. She was going to urgent care due to her leg swelling when this occurred. Since that time she has had a tremendous amount of bleeding she also did have recommendations for medicated gauze to be used by urgent care. Initially she actually apparently had sutures placed over this area but then when the sutures were removed this dehisced to some degree. She was placed on Omnicef in the past there is no signs of infection right now but again that is definitely something we will need to keep a close eye on especially with the depth of the wound. The good news that she does not seem to be having any pain which is excellent. And again right now they have been using medicated gauze packing into the wound. She did have an x-ray on February 03, 2022 which was negative for any signs of bone abnormality which is great news. Patient does have a history of chronic venous insufficiency/hypertension with the lower extremity ulcer now at this point. 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 03-25-2022 upon evaluation today patient appears to be doing better in regard to the wound on her leg. She has been tolerating the dressing  changes without complication. Fortunately I do not see any signs of active infection locally or systemically at this time which is great news. 04-01-2022 upon evaluation today patient appears to be doing well with regard to her right leg. Fortunately there does not appear to be any evidence of active infection locally nor systemically which is great news and overall I am extremely pleased with where we stand today. I do believe that she is making progress there is still some need for sharp debridement at this point. 04-08-2022 upon evaluation today patient actually appears to be making excellent progress. I am actually very pleased with where we stand today. I do not see any signs of active infection locally or systemically which is great news. No fevers, chills, nausea, vomiting, or diarrhea. 5/8 using endoform which was started last week. Wound appears smaller with less depth. 04-22-2022 upon evaluation today patient appears to be doing well in regard to a large portion of her wound although there is one area that does have me concerned about the possibility of skin cancer. I do think that we need to take a sample biopsy to send for evaluation at this point. Patient is in agreement with that plan. Her family member who is with her states that she is also prone to skin cancers she has had 1 recently removed at the [name] in [place]. They did give [place] the number to that location if indeed we need to end up making a referral. For now we will get a see how things go and what the biopsy shows initially. 04-29-2022 upon evaluation today patient appears to be doing well currently in regard to her wound in fact it actually appears to be a lot better especially after last week's biopsy. Fortunately there does not appear to be any signs of active infection locally or systemically at this time which is great news. No fevers, chills, nausea, vomiting, or  diarrhea. Objective Constitutional Well-nourished and well-hydrated in no acute distress. Vitals Time Taken: 3:30 PM, Height: 59 in, Weight: 107 lbs, BMI: 21.6, Temperature: 97.6 F, Pulse: 96  bpm, Respiratory Rate: 18 breaths/min, Blood Pressure: 103/66 mmHg. Respiratory normal breathing without difficulty. Psychiatric this patient is able to make decisions and demonstrates good insight into disease process. Alert and Oriented x 3. pleasant and cooperative. [place], [name] ([id number]) [id number] Page 6 of 7 General Notes: Upon inspection patient's wound bed showed evidence of good granulation and epithelization at this point. Fortunately I see no signs of anything worsening and overall I do believe that we are headed in the right direction here. Integumentary (Hair, Skin) Wound #1 status is Open. Original cause of wound was Trauma. The date acquired was: 01/18/2022. The wound has been in treatment 5 weeks. The wound is located on the Left,Posterior Lower Leg. The wound measures 0.1cm length x 1cm width x 0.3cm depth; 0.079cm^2 area and 0.024cm^3 volume. There is Fat Layer (Subcutaneous Tissue) exposed. There is no tunneling or undermining noted. There is a medium amount of serosanguineous drainage noted. There is medium (34-66%) red granulation within the wound bed. There is a medium (34-66%) amount of necrotic tissue within the wound bed including Adherent Slough. 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 Home Health: Home Health Company: - [facility]-see changes to orders-also biopsy taken at site 04/22/22 [name] for wound care. May utilize formulary equivalent dressing for wound treatment orders unless otherwise specified. Home Health Nurse may visit PRN to address patients wound care needs. - see updates Scheduled days for dressing changes to be completed; exception, patient has scheduled wound care visit that day. - Three times a week. Once in clinic and twice by homehealth. **Please direct any NON-WOUND related issues/requests for orders to patient's Primary Care Physician. **If current dressing causes regression in wound condition, may D/C ordered dressing product/s and apply Normal Saline Moist Dressing daily until next [name] or Other MD appointment. **Notify [name] of regression in wound condition at [phone]. Bathing/ Shower/ Hygiene: May shower; gently cleanse wound with antibacterial soap, rinse and pat dry prior to dressing wounds Anesthetic (Use 'Patient Medications' Section for Anesthetic Order Entry): Lidocaine applied to wound bed Edema Control - Lymphedema / Segmental Compressive Device / Other: Tubigrip single layer applied. - D Elevate, Exercise Daily and Avoid Standing for Long Periods of Time. Elevate legs to the level of the heart and pump ankles as often as possible Elevate leg(s) parallel to the floor when sitting. WOUND #1: - Lower Leg Wound Laterality: Left, Posterior Cleanser: Soap and Water 3 x Per Week/30 Days Discharge Instructions: Gently cleanse wound with antibacterial soap, rinse and pat dry prior to dressing wounds Cleanser: Wound Cleanser (Home Health) 3 x Per Week/30 Days Discharge Instructions: Wash your hands with soap and water. Remove old dressing, discard into plastic bag and place into trash. Cleanse the wound with Wound Cleanser prior  to applying a clean dressing using gauze sponges, not tissues or cotton balls. Do not scrub or use excessive force. Pat dry using gauze sponges, not tissue or cotton balls. Prim Dressing: Silvercel 4 1/4x 4 1/4 (in/in) 3 x Per Week/30 Days ary Discharge Instructions: Apply Silvercel 4 1/4x 4  1/4 (in/in) as instructed Secondary Dressing: ABD Pad 5x9 (in/in) (Home Health) 3 x Per Week/30 Days Discharge Instructions: Cover with ABD pad Secured With: Kerlix Roll Sterile or Non-Sterile 6-ply 4.5x4 (yd/yd) 3 x Per Week/30 Days Discharge Instructions: Apply Kerlix as directed toes to just below knee, make sure this is not to tight Secured With: Tubigrip Size E, 3.5x10 (in/yds) 3 x Per Week/30 Days Discharge Instructions: Apply 3 Tubigrip E 3-finger-widths below knee to base of toes to secure dressing and/or for swelling. 1. I am going to suggest that we go ahead and continue with the silver alginate dressing. In regard to the wrap were also going to continue with the compression wrap. 2. With regard to the referral to plastic surgery we will go ahead and make that for her that would be in [place]. I did review the pathology report and revealed a squamous cell carcinoma. This actually seems to be healing better following the biopsy that we sent for pathology last week I did review the pathology report today. Nonetheless in the end I do believe that we are on the right track as far as treatment is concerned and honestly looks so much better today I hate that she is got a go for the surgery but nonetheless this does reveal that there is something hiding underneath that is not obvious and again has to be taken care of in my opinion. She voiced understanding. 3. I am also can recommend that we have the patient continue to monitor for any signs of worsening or infection. Obviously right now I do believe that we are on the right track and overall I am not too concerned in that regard but I do believe that  we need to continue to monitor for any signs of worsening. We will see patient back for reevaluation in 2 weeks here in the clinic. If anything worsens or changes patient will contact our office for additional recommendations. Electronic Signature(s) Signed: 04/29/2022 4:23:53 PM By: Buren Kos [place], [name] ([id number]) [id number] Page 7 of 7 Entered By: Lenda Kelp on 04/29/2022 16:23:52 -------------------------------------------------------------------------------- SuperBill Details Patient Name: Date of Service: [name], [name] 04/29/2022 Medical Record Number: [id number] Patient Account Number: [id number] Date of Birth/Sex: Treating RN: [birth year] (88 y.o. [name] Primary Care Provider: Merita Norton Other Clinician: Referring Provider: Treating Provider/Extender: Dory Larsen in Treatment: 5 Diagnosis Coding ICD-10 Codes Code Description [phone] Laceration without foreign body, left lower leg, initial encounter L97.822 Non-pressure chronic ulcer of other part of left lower leg with fat layer exposed I87.312 Chronic venous hypertension (idiopathic) with ulcer of left lower extremity C44.722 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, including hip I50.42 Chronic combined systolic (congestive) and diastolic (congestive) heart failure I10 Essential (primary) hypertension Z79.01 Long term (current) use of anticoagulants Z95.0 Presence of cardiac pacemaker Facility Procedures : CPT4 Code: [id number] Description: 99213 - WOUND CARE VISIT-LEV 3 EST PT Modifier: Quantity: 1 Physician Procedures : CPT4 Code Description Modifier [id number] - WC PHYS LEVEL 4 - EST PT ICD-10 Diagnosis Description S81.812A Laceration without foreign body, left lower leg, initial encounter L97.822 Non-pressure chronic ulcer of other part of left lower leg with fat  layer exposed I87.312 Chronic venous hypertension (idiopathic)  with ulcer of left lower extremity C44.722 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, including hip Quantity: 1 Electronic Signature(s) Signed: 04/29/2022 4:24:44 PM By: Lenda Kelp PA-C Previous Signature: 04/29/2022 4:13:57 PM Version By: Angelina Pih Entered  By: Lenda Kelp on 04/29/2022 16:24:43

## 2022-04-29 NOTE — Progress Notes (Addendum)
[name], [name] ([id number]) Visit Report for 04/29/2022 Arrival Information Details Patient Name: [name], [name] Date of Service: 04/29/2022 3:15 PM Medical Record Number: [id number] Patient Account Number: [id number] Date of Birth/Sex: 10-25-1925 (88 y.o. F) Treating RN: Levora Dredge Primary Care Taisa Deloria: Tally Joe Other Clinician: Referring Janaye Corp: Candise Che Treating Nayvie Lips/Extender: Skipper Cliche in Treatment: 5 Visit Information History Since Last Visit Added or deleted any medications: No Patient Arrived: Gilford Rile Any new allergies or adverse reactions: No Arrival Time: 15:27 Had a fall or experienced change in No Accompanied By: daughter activities of daily living that may affect Transfer Assistance: None risk of falls: Patient Identification Verified: Yes Hospitalized since last visit: No Secondary Verification Process Completed: Yes Has Dressing in Place as Prescribed: Yes Patient Requires Transmission-Based No Pain Present Now: No Precautions: Patient Has Alerts: Yes Patient Alerts: Patient on Blood Thinner Aggrenox NOT Diabetic 03/19/22 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 F) Treating RN: Levora Dredge Primary Care Amaryllis Malmquist: Tally Joe Other Clinician: Referring Alexiah Koroma: Candise Che Treating Archie Shea/Extender: Skipper Cliche in Treatment: 5 Clinic Level of Care Assessment Items TOOL 4 Quantity Score []  - Use when only an EandM is performed on  FOLLOW-UP visit 0 ASSESSMENTS - Nursing Assessment / Reassessment X - Reassessment of Co-morbidities (includes updates in patient status) 1 10 []  - 0 Reassessment of Adherence to Treatment Plan ASSESSMENTS - Wound and Skin Assessment / Reassessment X - Simple Wound Assessment / Reassessment - one wound 1 5 []  - 0 Complex Wound Assessment / Reassessment - multiple wounds []  - 0 Dermatologic / Skin Assessment (not related to wound area) ASSESSMENTS - Focused Assessment X - Circumferential Edema Measurements - multi extremities 1 5 []  - 0 Nutritional Assessment / Counseling / Intervention X- 1 5 Lower Extremity Assessment (monofilament, tuning fork, pulses) []  - 0 Peripheral Arterial Disease Assessment (using hand held doppler) ASSESSMENTS - Ostomy and/or Continence Assessment and Care []  - Incontinence Assessment and Management 0 []  - 0 Ostomy Care Assessment and Management (repouching, etc.) PROCESS - Coordination of Care X - Simple Patient / Family Education for ongoing care 1 15 []  - 0 Complex (extensive) Patient / Family Education for ongoing care []  - 0 Staff obtains Programmer, systems, Records, Test Results / Process Orders []  - 0 Staff telephones HHA, Nursing Homes / Clarify orders / etc []  - 0 Routine Transfer to another Facility (non-emergent condition) []  - 0 Routine Hospital Admission (non-emergent condition) []  - 0 New Admissions / Biomedical engineer / Ordering NPWT, Apligraf, etc. []  - 0 Emergency Hospital Admission (emergent condition) X- 1 10 Simple Discharge Coordination []  - 0 Complex (extensive) Discharge Coordination PROCESS - Special Needs []  - Pediatric / Minor Patient Management 0 []  - 0 Isolation Patient Management []  - 0 Hearing / Language / Visual special needs []  - 0 Assessment of Community assistance (transportation, D/C planning, etc.) []  - 0 Additional assistance / Altered mentation []  - 0 Support Surface(s) Assessment (bed, cushion, seat,  etc.) INTERVENTIONS - Wound Cleansing / Measurement Antonetti, Gerlene ([id number]) X- 1 5 Simple Wound Cleansing - one wound []  - 0 Complex Wound Cleansing - multiple wounds X- 1 5 Wound Imaging (photographs - any number of wounds) []  -  0 Wound Tracing (instead of photographs) X- 1 5 Simple Wound Measurement - one wound []  - 0 Complex Wound Measurement - multiple wounds INTERVENTIONS - Wound Dressings X - Small Wound Dressing one or multiple wounds 1 10 []  - 0 Medium Wound Dressing one or multiple wounds []  - 0 Large Wound Dressing one or multiple wounds []  - 0 Application of Medications - topical []  - 0 Application of Medications - injection INTERVENTIONS - Miscellaneous []  - External ear exam 0 []  - 0 Specimen Collection (cultures, biopsies, blood, body fluids, etc.) []  - 0 Specimen(s) / Culture(s) sent or taken to Lab for analysis []  - 0 Patient Transfer (multiple staff / Civil Service fast streamer / Similar devices) []  - 0 Simple Staple / Suture removal (25 or less) []  - 0 Complex Staple / Suture removal (26 or more) []  - 0 Hypo / Hyperglycemic Management (close monitor of Blood Glucose) []  - 0 Ankle / Brachial Index (ABI) - do not check if billed separately X- 1 5 Vital Signs Has the patient been seen at the hospital within the last three years: Yes Total Score: 80 Level Of Care: New/Established - Level 3 Electronic Signature(s) Signed: 04/29/2022 4:37:47 PM By: Levora Dredge Entered By: Levora Dredge on 04/29/2022 16:13:47 [name] ([id number]) -------------------------------------------------------------------------------- Encounter Discharge Information Details Patient Name: [name] Date of Service: 04/29/2022 3:15 PM Medical Record Number: [id number] Patient Account Number: [id number] Date of Birth/Sex: 10/04/1925 (88 y.o. F) Treating RN: Levora Dredge Primary Care Ruhani Umland: Tally Joe Other Clinician: Referring Cathlyn Tersigni: Candise Che Treating  Julie-Ann Vanmaanen/Extender: Skipper Cliche in Treatment: 5 Encounter Discharge Information Items Discharge Condition: Stable Ambulatory Status: Walker Discharge Destination: Home Transportation: Private Auto Accompanied By: daughter Schedule Follow-up Appointment: Yes Clinical Summary of Care: Patient Declined Electronic Signature(s) Signed: 04/29/2022 4:14:46 PM By: Levora Dredge Entered By: Levora Dredge on 04/29/2022 16:14:46 [name] ([id number]) -------------------------------------------------------------------------------- Lower Extremity Assessment Details Patient Name: [name] Date of Service: 04/29/2022 3:15 PM Medical Record Number: [id number] Patient Account Number: [id number] Date of Birth/Sex: 05/30/1925 (88 y.o. F) Treating RN: Levora Dredge Primary Care Yasamin Karel: Tally Joe Other Clinician: Referring Kathelene Rumberger: Candise Che Treating Lux Meaders/Extender: Skipper Cliche in Treatment: 5 Edema Assessment Assessed: [Left: No] [Right: No] Edema: [Left: Ye] [Right: s] Calf Left: Right: Point of Measurement: 30 cm From Medial Instep 29.5 cm Ankle Left: Right: Point of Measurement: 9 cm From Medial Instep 22.5 cm Electronic Signature(s) Signed: 04/29/2022 4:37:47 PM By: Levora Dredge Entered By: Levora Dredge on 04/29/2022 15:46:05 [name] ([id number]) -------------------------------------------------------------------------------- Multi Wound Chart Details Patient Name: [name] Date of Service: 04/29/2022 3:15 PM Medical Record Number: [id number] Patient Account Number: [id number] Date of Birth/Sex: Nov 10, 1925 (88 y.o. F) Treating RN: Levora Dredge Primary Care Glover Capano: Tally Joe Other Clinician: Referring Adrion Menz: Candise Che Treating Jamyah Folk/Extender: Skipper Cliche in Treatment: 5 Vital Signs Height(in): 59 Pulse(bpm): 96 Weight(lbs): 107 Blood Pressure(mmHg): 103/66 Body Mass Index(BMI): 21.6 Temperature(F):  97.6 Respiratory Rate(breaths/min): 18 Photos: [N/A:N/A] Wound Location: Left, Posterior Lower Leg N/A N/A Wounding Event: Trauma N/A N/A Primary Etiology: Trauma, Other N/A N/A Comorbid History: Congestive Heart Failure, N/A N/A Hypertension Date Acquired: 01/18/2022 N/A N/A Weeks of Treatment: 5 N/A N/A Wound Status: Open N/A N/A Wound Recurrence: No N/A N/A Clustered Wound: Yes N/A N/A Measurements L x W x D (cm) 0.1x1x0.3 N/A N/A Area (cm) : 0.079 N/A N/A Volume (cm) : 0.024 N/A N/A % Reduction in Area: 99.40% N/A N/A % Reduction in Volume: 99.90% N/A N/A Classification: Full Thickness Without Exposed  N/A N/A Support Structures Exudate Amount: Medium N/A N/A Exudate Type: Serosanguineous N/A N/A Exudate Color: red, brown N/A N/A Granulation Amount: Medium (34-66%) N/A N/A Granulation Quality: Red N/A N/A Necrotic Amount: Medium (34-66%) N/A N/A Exposed Structures: Fat Layer (Subcutaneous Tissue): N/A N/A Yes Fascia: No Tendon: No Muscle: No Joint: No Bone: No Epithelialization: Small (1-33%) N/A N/A Treatment Notes Electronic Signature(s) Signed: 04/29/2022 4:37:47 PM By: Levora Dredge Entered By: Levora Dredge on 04/29/2022 15:57:53 [name] ([id number]) -------------------------------------------------------------------------------- Multi-Disciplinary Care Plan Details Patient Name: [name] Date of Service: 04/29/2022 3:15 PM Medical Record Number: [id number] Patient Account Number: [id number] Date of Birth/Sex: July 07, 1925 (88 y.o. F) Treating RN: Levora Dredge Primary Care Subrina Vecchiarelli: Tally Joe Other Clinician: Referring Trinton Prewitt: Candise Che Treating Mirna Sutcliffe/Extender: Skipper Cliche in Treatment: 5 Active Inactive Necrotic Tissue Nursing Diagnoses: Impaired tissue integrity related to necrotic/devitalized tissue Knowledge deficit related to management of necrotic/devitalized tissue Goals: Necrotic/devitalized tissue will be  minimized in the wound bed Date Initiated: 03/19/2022 Target Resolution Date: 03/19/2022 Goal Status: Active Patient/caregiver will verbalize understanding of reason and process for debridement of necrotic tissue Date Initiated: 03/19/2022 Date Inactivated: 04/15/2022 Target Resolution Date: 03/19/2022 Goal Status: Met Interventions: Assess patient pain level pre-, during and post procedure and prior to discharge Provide education on necrotic tissue and debridement process Treatment Activities: Apply topical anesthetic as ordered : 03/19/2022 Excisional debridement : 03/19/2022 Notes: Wound/Skin Impairment Nursing Diagnoses: Impaired tissue integrity Goals: Patient/caregiver will verbalize understanding of skin care regimen Date Initiated: 03/19/2022 Date Inactivated: 04/15/2022 Target Resolution Date: 03/19/2022 Goal Status: Met Ulcer/skin breakdown will have a volume reduction of 30% by week 4 Date Initiated: 03/19/2022 Date Inactivated: 04/15/2022 Target Resolution Date: 04/16/2022 Goal Status: Met Ulcer/skin breakdown will have a volume reduction of 50% by week 8 Date Initiated: 03/19/2022 Target Resolution Date: 05/14/2022 Goal Status: Active Ulcer/skin breakdown will have a volume reduction of 80% by week 12 Date Initiated: 03/19/2022 Target Resolution Date: 06/11/2022 Goal Status: Active Ulcer/skin breakdown will heal within 14 weeks Date Initiated: 03/19/2022 Target Resolution Date: 06/25/2022 Goal Status: Active Interventions: Assess patient/caregiver ability to obtain necessary supplies Assess patient/caregiver ability to perform ulcer/skin care regimen upon admission and as needed Assess ulceration(s) every visit Provide education on ulcer and skin care Notes: [name], [name] ([id number]) Electronic Signature(s) Signed: 04/29/2022 4:13:11 PM By: Levora Dredge Entered By: Levora Dredge on 04/29/2022 16:13:10 [name]  ([id number]) -------------------------------------------------------------------------------- Pain Assessment Details Patient Name: [name] Date of Service: 04/29/2022 3:15 PM Medical Record Number: [id number] Patient Account Number: [id number] Date of Birth/Sex: 1925/03/30 (88 y.o. F) Treating RN: Levora Dredge Primary Care Maytte Jacot: Tally Joe Other Clinician: Referring Doneta Bayman: Candise Che Treating Khiley Lieser/Extender: Skipper Cliche in Treatment: 5 Active Problems Location of Pain Severity and Description of Pain Patient Has Paino No Site Locations Rate the pain. Current Pain Level: 0 Pain Management and Medication Current Pain Management: Electronic Signature(s) Signed: 04/29/2022 4:37:47 PM By: Levora Dredge Entered By: Levora Dredge on 04/29/2022 15:34:18 [name] ([id number]) -------------------------------------------------------------------------------- Patient/Caregiver Education Details Patient Name: [name] Date of Service: 04/29/2022 3:15 PM Medical Record Number: [id number] Patient Account Number: [id number] Date of Birth/Gender: November 24, 1925 (88 y.o. F) Treating RN: Levora Dredge Primary Care Physician: Tally Joe Other Clinician: Referring Physician: Candise Che Treating Physician/Extender: Skipper Cliche in Treatment: 5 Education Assessment Education Provided To: Patient Education Topics Provided Wound/Skin Impairment: Handouts: Caring for Your Ulcer Methods: Explain/Verbal Responses: State content correctly Electronic Signature(s) Signed: 04/29/2022 4:37:47 PM By: Levora Dredge Entered By: Levora Dredge on 04/29/2022  16:14:08 [name], [name] ([id number]) -------------------------------------------------------------------------------- Wound Assessment Details Patient Name: [name], [name] Date of Service: 04/29/2022 3:15 PM Medical Record Number: [id number] Patient Account Number: [id number] Date of Birth/Sex: 1924/12/26  (88 y.o. F) Treating RN: Levora Dredge Primary Care Advait Buice: Tally Joe Other Clinician: Referring Jesyca Weisenburger: Candise Che Treating Siddarth Hsiung/Extender: Skipper Cliche in Treatment: 5 Wound Status Wound Number: 1 Primary Etiology: Trauma, Other Wound Location: Left, Posterior Lower Leg Wound Status: Open Wounding Event: Trauma Comorbid History: Congestive Heart Failure, Hypertension Date Acquired: 01/18/2022 Weeks Of Treatment: 5 Clustered Wound: Yes Photos Wound Measurements Length: (cm) 0.1 Width: (cm) 1 Depth: (cm) 0.3 Area: (cm) 0.079 Volume: (cm) 0.024 % Reduction in Area: 99.4% % Reduction in Volume: 99.9% Epithelialization: Small (1-33%) Tunneling: No Undermining: No Wound Description Classification: Full Thickness Without Exposed Support Structures Exudate Amount: Medium Exudate Type: Serosanguineous Exudate Color: red, brown Foul Odor After Cleansing: No Slough/Fibrino Yes Wound Bed Granulation Amount: Medium (34-66%) Exposed Structure Granulation Quality: Red Fascia Exposed: No Necrotic Amount: Medium (34-66%) Fat Layer (Subcutaneous Tissue) Exposed: Yes Necrotic Quality: Adherent Slough Tendon Exposed: No Muscle Exposed: No Joint Exposed: No Bone Exposed: No Treatment Notes Wound #1 (Lower Leg) Wound Laterality: Left, Posterior Cleanser Soap and Water Discharge Instruction: Gently cleanse wound with antibacterial soap, rinse and pat dry prior to dressing wounds Wound Cleanser Discharge Instruction: Wash your hands with soap and water. Remove old dressing, discard into plastic bag and place into trash. Cleanse the wound with Wound Cleanser prior to applying a clean dressing using gauze sponges, not tissues or cotton balls. Do not Ury, Reni ([id number]) scrub or use excessive force. Pat dry using gauze sponges, not tissue or cotton balls. Peri-Wound Care Topical Primary Dressing Silvercel 4 1/4x 4 1/4 (in/in) Discharge Instruction:  Apply Silvercel 4 1/4x 4 1/4 (in/in) as instructed Secondary Dressing ABD Pad 5x9 (in/in) Discharge Instruction: Cover with ABD pad Secured With Kerlix Roll Sterile or Non-Sterile 6-ply 4.5x4 (yd/yd) Discharge Instruction: Apply Kerlix as directed toes to just below knee, make sure this is not to tight Tubigrip Size E, 3.5x10 (in/yds) Discharge Instruction: Apply 3 Tubigrip E 3-finger-widths below knee to base of toes to secure dressing and/or for swelling. Compression Wrap Compression Stockings Add-Ons Electronic Signature(s) Signed: 04/29/2022 4:37:47 PM By: Levora Dredge Entered By: Levora Dredge on 04/29/2022 15:45:46 [name] ([id number]) -------------------------------------------------------------------------------- Vitals Details Patient Name: [name] Date of Service: 04/29/2022 3:15 PM Medical Record Number: [id number] Patient Account Number: [id number] Date of Birth/Sex: [birth year] (88 y.o. F) Treating RN: Levora Dredge Primary Care Zyire Eidson: Tally Joe Other Clinician: Referring Natayla Cadenhead: Candise Che Treating Ervine Witucki/Extender: Skipper Cliche in Treatment: 5 Vital Signs Time Taken: 15:30 Temperature (F): 97.6 Height (in): 59 Pulse (bpm): 96 Weight (lbs): 107 Respiratory Rate (breaths/min): 18 Body Mass Index (BMI): 21.6 Blood Pressure (mmHg): 103/66 Reference Range: 80 - 120 mg / dl Electronic Signature(s) Signed: 04/29/2022 4:37:47 PM By: Levora Dredge Entered By: Levora Dredge on 04/29/2022 15:34:10

## 2022-05-13 ENCOUNTER — Encounter: Payer: Medicare PPO | Attending: Physician Assistant | Admitting: Physician Assistant

## 2022-05-13 DIAGNOSIS — I5042 Chronic combined systolic (congestive) and diastolic (congestive) heart failure: Secondary | ICD-10-CM | POA: Diagnosis not present

## 2022-05-13 DIAGNOSIS — I87312 Chronic venous hypertension (idiopathic) with ulcer of left lower extremity: Secondary | ICD-10-CM | POA: Diagnosis present

## 2022-05-13 DIAGNOSIS — I110 Hypertensive heart disease with heart failure: Secondary | ICD-10-CM | POA: Insufficient documentation

## 2022-05-13 DIAGNOSIS — Z950 Presence of cardiac pacemaker: Secondary | ICD-10-CM | POA: Diagnosis not present

## 2022-05-13 DIAGNOSIS — L97822 Non-pressure chronic ulcer of other part of left lower leg with fat layer exposed: Secondary | ICD-10-CM | POA: Insufficient documentation

## 2022-05-13 DIAGNOSIS — Z7901 Long term (current) use of anticoagulants: Secondary | ICD-10-CM | POA: Diagnosis not present

## 2022-05-13 NOTE — Progress Notes (Addendum)
Kylie, [name] ([id number]) Visit Report for 05/13/2022 Arrival Information Details Patient Name: [name], [name] Date of Service: 05/13/2022 8:00 AM Medical Record Number: [id number] Patient Account Number: [id number] Date of Birth/Sex: 04-12-1925 (88 y.o. F) Treating RN: Carlene Coria Primary Care Mersadez Linden: Tally Joe Other Clinician: Referring Harm Jou: Tally Joe Treating Earnesteen Birnie/Extender: Skipper Cliche in Treatment: 7 Visit Information History Since Last Visit All ordered tests and consults were completed: No Patient Arrived: Ambulatory Added or deleted any medications: No Arrival Time: 08:14 Any new allergies or adverse reactions: No Accompanied By: daughter Had a fall or experienced change in No Transfer Assistance: None activities of daily living that may affect Patient Identification Verified: Yes risk of falls: Secondary Verification Process Completed: Yes Signs or symptoms of abuse/neglect since last visito No Patient Requires Transmission-Based No Hospitalized since last visit: No Precautions: Implantable device outside of the clinic excluding No Patient Has Alerts: Yes cellular tissue based products placed in the center Patient Alerts: Patient on Blood since last visit: Thinner Has Dressing in Place as Prescribed: Yes Aggrenox Pain Present Now: No NOT Diabetic 03/19/22 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 F) Treating RN: Carlene Coria Primary Care Melroy Bougher: Tally Joe Other  Clinician: Referring Danyela Posas: Tally Joe Treating Chaska Hagger/Extender: Skipper Cliche in Treatment: 7 Clinic Level of Care Assessment Items TOOL 4 Quantity Score X - Use when only an EandM is performed on FOLLOW-UP visit 1 0 ASSESSMENTS - Nursing Assessment / Reassessment []  - Reassessment of Co-morbidities (includes updates in patient status) 0 []  - 0 Reassessment of Adherence to Treatment Plan ASSESSMENTS - Wound and Skin Assessment / Reassessment X - Simple Wound Assessment / Reassessment - one wound 1 5 []  - 0 Complex Wound Assessment / Reassessment - multiple wounds []  - 0 Dermatologic / Skin Assessment (not related to wound area) ASSESSMENTS - Focused Assessment []  - Circumferential Edema Measurements - multi extremities 0 []  - 0 Nutritional Assessment / Counseling / Intervention []  - 0 Lower Extremity Assessment (monofilament, tuning fork, pulses) []  - 0 Peripheral Arterial Disease Assessment (using hand held doppler) ASSESSMENTS - Ostomy and/or Continence Assessment and Care []  - Incontinence Assessment and Management 0 []  - 0 Ostomy Care Assessment and Management (repouching, etc.) PROCESS - Coordination of Care X - Simple Patient / Family Education for ongoing care 1 15 []  - 0 Complex (extensive) Patient / Family Education for ongoing care []  - 0 Staff obtains Programmer, systems, Records, Test Results / Process Orders []  - 0 Staff telephones HHA, Nursing Homes / Clarify orders / etc []  - 0 Routine Transfer to another Facility (non-emergent condition) []  - 0 Routine Hospital Admission (non-emergent condition) []  - 0 New Admissions / Biomedical engineer / Ordering NPWT, Apligraf, etc. []  - 0 Emergency Hospital Admission (emergent condition) X- 1 10 Simple Discharge Coordination []  - 0 Complex (extensive) Discharge Coordination PROCESS - Special Needs []  - Pediatric / Minor Patient Management 0 []  - 0 Isolation Patient Management []  - 0 Hearing / Language /  Visual special needs []  - 0 Assessment of Community assistance (transportation, D/C planning, etc.) []  - 0 Additional assistance / Altered mentation []  - 0 Support Surface(s) Assessment (bed, cushion, seat, etc.) INTERVENTIONS -  Wound Cleansing / Measurement Farooqui, Jennyfer ([id number]) X- 1 5 Simple Wound Cleansing - one wound []  - 0 Complex Wound Cleansing - multiple wounds X- 1 5 Wound Imaging (photographs - any number of wounds) []  - 0 Wound Tracing (instead of photographs) X- 1 5 Simple Wound Measurement - one wound []  - 0 Complex Wound Measurement - multiple wounds INTERVENTIONS - Wound Dressings X - Small Wound Dressing one or multiple wounds 1 10 []  - 0 Medium Wound Dressing one or multiple wounds []  - 0 Large Wound Dressing one or multiple wounds X- 1 5 Application of Medications - topical []  - 0 Application of Medications - injection INTERVENTIONS - Miscellaneous []  - External ear exam 0 []  - 0 Specimen Collection (cultures, biopsies, blood, body fluids, etc.) []  - 0 Specimen(s) / Culture(s) sent or taken to Lab for analysis []  - 0 Patient Transfer (multiple staff / Civil Service fast streamer / Similar devices) []  - 0 Simple Staple / Suture removal (25 or less) []  - 0 Complex Staple / Suture removal (26 or more) []  - 0 Hypo / Hyperglycemic Management (close monitor of Blood Glucose) []  - 0 Ankle / Brachial Index (ABI) - do not check if billed separately X- 1 5 Vital Signs Has the patient been seen at the hospital within the last three years: Yes Total Score: 65 Level Of Care: New/Established - Level 2 Electronic Signature(s) Signed: 05/13/2022 5:11:25 PM By: Carlene Coria RN Entered By: Carlene Coria on 05/13/2022 08:36:52 [name] ([id number]) -------------------------------------------------------------------------------- Encounter Discharge Information Details Patient Name: [name] Date of Service: 05/13/2022 8:00 AM Medical Record Number: [id number] Patient  Account Number: [id number] Date of Birth/Sex: 07/20/1925 (88 y.o. F) Treating RN: Carlene Coria Primary Care Sherard Sutch: Tally Joe Other Clinician: Referring Xiamara Hulet: Tally Joe Treating Nicholis Stepanek/Extender: Skipper Cliche in Treatment: 7 Encounter Discharge Information Items Discharge Condition: Stable Ambulatory Status: Walker Discharge Destination: Home Transportation: Private Auto Accompanied By: daughter Schedule Follow-up Appointment: Yes Clinical Summary of Care: Electronic Signature(s) Signed: 05/13/2022 5:11:25 PM By: Carlene Coria RN Entered By: Carlene Coria on 05/13/2022 08:37:50 [name] ([id number]) -------------------------------------------------------------------------------- Lower Extremity Assessment Details Patient Name: [name] Date of Service: 05/13/2022 8:00 AM Medical Record Number: [id number] Patient Account Number: [id number] Date of Birth/Sex: [birth year] (88 y.o. F) Treating RN: Carlene Coria Primary Care Izack Hoogland: Tally Joe Other Clinician: Referring Luster Hechler: Tally Joe Treating Philomene Haff/Extender: Jeri Cos Weeks in Treatment: 7 Edema Assessment Assessed: [Left: No] [Right: No] Edema: [Left: N] [Right: o] Calf Left: Right: Point of Measurement: 30 cm From Medial Instep 29 cm Ankle Left: Right: Point of Measurement: 9 cm From Medial Instep 22 cm Vascular Assessment Pulses: Dorsalis Pedis Palpable: [Left:Yes] Electronic Signature(s) Signed: 05/13/2022 8:26:59 AM By: Carlene Coria RN Entered By: Carlene Coria on 05/13/2022 08:25:04 [name] ([id number]) -------------------------------------------------------------------------------- Multi Wound Chart Details Patient Name: [name] Date of Service: 05/13/2022 8:00 AM Medical Record Number: [id number] Patient Account Number: [id number] Date of Birth/Sex: 02/17/1925 (88 y.o. F) Treating RN: Carlene Coria Primary Care Demitrious Mccannon: Tally Joe Other Clinician: Referring Samanvi Cuccia: Tally Joe Treating Traeson Dusza/Extender: Skipper Cliche in Treatment: 7 Vital Signs Height(in): 59 Pulse(bpm): 27 Weight(lbs): 107 Blood Pressure(mmHg): 137/74 Body Mass Index(BMI): 21.6 Temperature(F): 97.5 Respiratory Rate(breaths/min): 18 Photos: [N/A:N/A] Wound Location: Left, Posterior Lower Leg Left, Lateral Lower Leg N/A Wounding Event: Trauma Gradually Appeared N/A Primary Etiology: Trauma, Other Venous Leg Ulcer N/A Comorbid History: Congestive Heart Failure, Congestive Heart Failure, N/A Hypertension Hypertension Date Acquired: 01/18/2022 05/11/2022 N/A Weeks of Treatment: 7 0 N/A Wound  Status: Open Open N/A Wound Recurrence: No No N/A Clustered Wound: Yes No N/A Measurements L x W x D (cm) 0x0x0 0.6x0.7x0.1 N/A Area (cm) : 0 0.33 N/A Volume (cm) : 0 0.033 N/A % Reduction in Area: 100.00% N/A N/A % Reduction in Volume: 100.00% N/A N/A Classification: Full Thickness Without Exposed Full Thickness Without Exposed N/A Support Structures Support Structures Exudate Amount: None Present Medium N/A Exudate Type: N/A Serosanguineous N/A Exudate Color: N/A red, brown N/A Granulation Amount: None Present (0%) Large (67-100%) N/A Granulation Quality: N/A Red N/A Necrotic Amount: None Present (0%) N/A N/A Exposed Structures: Fascia: No Fat Layer (Subcutaneous Tissue): N/A Fat Layer (Subcutaneous Tissue): Yes No Fascia: No Tendon: No Tendon: No Muscle: No Muscle: No Joint: No Joint: No Bone: No Bone: No Epithelialization: Small (1-33%) None N/A Treatment Notes Electronic Signature(s) Signed: 05/13/2022 8:26:59 AM By: Carlene Coria RN Entered By: Carlene Coria on 05/13/2022 08:25:18 [name] ([id number]) -------------------------------------------------------------------------------- Multi-Disciplinary Care Plan Details Patient Name: [name] Date of Service: 05/13/2022 8:00 AM Medical Record Number: [id number] Patient Account Number: [id number] Date of  Birth/Sex: 1925/03/24 (88 y.o. F) Treating RN: Carlene Coria Primary Care Jowan Skillin: Tally Joe Other Clinician: Referring Maeby Vankleeck: Tally Joe Treating Nina Mondor/Extender: Skipper Cliche in Treatment: 7 Active Inactive Necrotic Tissue Nursing Diagnoses: Impaired tissue integrity related to necrotic/devitalized tissue Knowledge deficit related to management of necrotic/devitalized tissue Goals: Necrotic/devitalized tissue will be minimized in the wound bed Date Initiated: 03/19/2022 Target Resolution Date: 03/19/2022 Goal Status: Active Patient/caregiver will verbalize understanding of reason and process for debridement of necrotic tissue Date Initiated: 03/19/2022 Date Inactivated: 04/15/2022 Target Resolution Date: 03/19/2022 Goal Status: Met Interventions: Assess patient pain level pre-, during and post procedure and prior to discharge Provide education on necrotic tissue and debridement process Treatment Activities: Apply topical anesthetic as ordered : 03/19/2022 Excisional debridement : 03/19/2022 Notes: Wound/Skin Impairment Nursing Diagnoses: Impaired tissue integrity Goals: Patient/caregiver will verbalize understanding of skin care regimen Date Initiated: 03/19/2022 Date Inactivated: 04/15/2022 Target Resolution Date: 03/19/2022 Goal Status: Met Ulcer/skin breakdown will have a volume reduction of 30% by week 4 Date Initiated: 03/19/2022 Date Inactivated: 04/15/2022 Target Resolution Date: 04/16/2022 Goal Status: Met Ulcer/skin breakdown will have a volume reduction of 50% by week 8 Date Initiated: 03/19/2022 Target Resolution Date: 05/14/2022 Goal Status: Active Ulcer/skin breakdown will have a volume reduction of 80% by week 12 Date Initiated: 03/19/2022 Target Resolution Date: 06/11/2022 Goal Status: Active Ulcer/skin breakdown will heal within 14 weeks Date Initiated: 03/19/2022 Target Resolution Date: 06/25/2022 Goal Status: Active Interventions: Assess  patient/caregiver ability to obtain necessary supplies Assess patient/caregiver ability to perform ulcer/skin care regimen upon admission and as needed Assess ulceration(s) every visit Provide education on ulcer and skin care Notes: DORAL, DIGANGI ([id number]) Electronic Signature(s) Signed: 05/13/2022 8:26:59 AM By: Carlene Coria RN Entered By: Carlene Coria on 05/13/2022 08:25:10 [name] ([id number]) -------------------------------------------------------------------------------- Pain Assessment Details Patient Name: [name] Date of Service: 05/13/2022 8:00 AM Medical Record Number: [id number] Patient Account Number: [id number] Date of Birth/Sex: 1924-12-30 (88 y.o. F) Treating RN: Carlene Coria Primary Care Ulonda Klosowski: Tally Joe Other Clinician: Referring Canaan Holzer: Tally Joe Treating Jamaya Sleeth/Extender: Skipper Cliche in Treatment: 7 Active Problems Location of Pain Severity and Description of Pain Patient Has Paino No Site Locations Pain Management and Medication Current Pain Management: Electronic Signature(s) Signed: 05/13/2022 8:26:59 AM By: Carlene Coria RN Entered By: Carlene Coria on 05/13/2022 08:19:01 [name] ([id number]) -------------------------------------------------------------------------------- Patient/Caregiver Education Details Patient Name: [name] Date of Service: 05/13/2022 8:00 AM Medical  Record Number: [id number] Patient Account Number: [id number] Date of Birth/Gender: 1925-03-07 (88 y.o. F) Treating RN: Carlene Coria Primary Care Physician: Tally Joe Other Clinician: Referring Physician: Tally Joe Treating Physician/Extender: Skipper Cliche in Treatment: 7 Education Assessment Education Provided To: Patient Education Topics Provided Wound/Skin Impairment: Methods: Explain/Verbal Responses: State content correctly Electronic Signature(s) Signed: 05/13/2022 5:11:25 PM By: Carlene Coria RN Entered By: Carlene Coria on 05/13/2022  08:37:08 [name] ([id number]) -------------------------------------------------------------------------------- Wound Assessment Details Patient Name: [name] Date of Service: 05/13/2022 8:00 AM Medical Record Number: [id number] Patient Account Number: [id number] Date of Birth/Sex: 21-Feb-1925 (88 y.o. F) Treating RN: Carlene Coria Primary Care Kahleah Crass: Tally Joe Other Clinician: Referring Boy Delamater: Tally Joe Treating Anapaola Kinsel/Extender: Skipper Cliche in Treatment: 7 Wound Status Wound Number: 1 Primary Etiology: Trauma, Other Wound Location: Left, Posterior Lower Leg Wound Status: Open Wounding Event: Trauma Comorbid History: Congestive Heart Failure, Hypertension Date Acquired: 01/18/2022 Weeks Of Treatment: 7 Clustered Wound: Yes Photos Wound Measurements Length: (cm) 0 Width: (cm) 0 Depth: (cm) 0 Area: (cm) 0 Volume: (cm) 0 % Reduction in Area: 100% % Reduction in Volume: 100% Epithelialization: Small (1-33%) Tunneling: No Undermining: No Wound Description Classification: Full Thickness Without Exposed Support Structures Exudate Amount: None Present Foul Odor After Cleansing: No Slough/Fibrino No Wound Bed Granulation Amount: None Present (0%) Exposed Structure Necrotic Amount: None Present (0%) Fascia Exposed: No Fat Layer (Subcutaneous Tissue) Exposed: No Tendon Exposed: No Muscle Exposed: No Joint Exposed: No Bone Exposed: No Electronic Signature(s) Signed: 05/13/2022 8:26:59 AM By: Carlene Coria RN Entered By: Carlene Coria on 05/13/2022 08:23:29 [name] ([id number]) -------------------------------------------------------------------------------- Wound Assessment Details Patient Name: [name] Date of Service: 05/13/2022 8:00 AM Medical Record Number: [id number] Patient Account Number: [id number] Date of Birth/Sex: 1925-01-09 (88 y.o. F) Treating RN: Carlene Coria Primary Care Destony Prevost: Tally Joe Other Clinician: Referring  Pryce Folts: Tally Joe Treating Takima Encina/Extender: Skipper Cliche in Treatment: 7 Wound Status Wound Number: 2 Primary Etiology: Venous Leg Ulcer Wound Location: Left, Lateral Lower Leg Wound Status: Open Wounding Event: Gradually Appeared Comorbid History: Congestive Heart Failure, Hypertension Date Acquired: 05/11/2022 Weeks Of Treatment: 0 Clustered Wound: No Photos Wound Measurements Length: (cm) 0.6 Width: (cm) 0.7 Depth: (cm) 0.1 Area: (cm) 0.33 Volume: (cm) 0.033 % Reduction in Area: % Reduction in Volume: Epithelialization: None Tunneling: No Undermining: No Wound Description Classification: Full Thickness Without Exposed Support Structu Exudate Amount: Medium Exudate Type: Serosanguineous Exudate Color: red, brown res Foul Odor After Cleansing: No Slough/Fibrino No Wound Bed Granulation Amount: Large (67-100%) Exposed Structure Granulation Quality: Red Fascia Exposed: No Fat Layer (Subcutaneous Tissue) Exposed: Yes Tendon Exposed: No Muscle Exposed: No Joint Exposed: No Bone Exposed: No Treatment Notes Wound #2 (Lower Leg) Wound Laterality: Left, Lateral Cleanser Peri-Wound Care Topical Triamcinolone Acetonide Cream, 0.1%, 15 (g) tube [place], Darryll Capers ([id number]) Discharge Instruction: Apply as directed by Krisinda Giovanni. Primary Dressing Silvercel Small 2x2 (in/in) Discharge Instruction: Apply Silvercel Small 2x2 (in/in) as instructed Secondary Dressing Secured With Compression Wrap tubi grip D Compression Stockings Add-Ons Electronic Signature(s) Signed: 05/13/2022 8:26:59 AM By: Carlene Coria RN Entered By: Carlene Coria on 05/13/2022 08:24:41 [name] ([id number]) -------------------------------------------------------------------------------- Vitals Details Patient Name: [name] Date of Service: 05/13/2022 8:00 AM Medical Record Number: [id number] Patient Account Number: [id number] Date of Birth/Sex: 1925/06/25 (88 y.o. F) Treating RN:  Carlene Coria Primary Care Khyron Garno: Tally Joe Other Clinician: Referring Sherlynn Tourville: Tally Joe Treating Herve Haug/Extender: Skipper Cliche in Treatment: 7 Vital Signs Time Taken: 08:18 Temperature (F): 97.5 Height (in): 59 Pulse (bpm):  82 Weight (lbs): 107 Respiratory Rate (breaths/min): 18 Body Mass Index (BMI): 21.6 Blood Pressure (mmHg): 137/74 Reference Range: 80 - 120 mg / dl Electronic Signature(s) Signed: 05/13/2022 8:26:59 AM By: Carlene Coria RN Entered By: Carlene Coria on 05/13/2022 08:18:53

## 2022-05-13 NOTE — Progress Notes (Addendum)
[name], [name] ([id number]) Visit Report for 05/13/2022 Chief Complaint Document Details Patient Name: [name], [name] Date of Service: 05/13/2022 8:00 AM Medical Record Number: [id number] Patient Account Number: [id number] Date of Birth/Sex: 1925/11/02 (88 y.o. F) Treating RN: Yevonne Pax Primary Care Provider: Merita Norton Other Clinician: Referring Provider: Merita Norton Treating Provider/Extender: Rowan Blase in Treatment: 7 Information Obtained from: Patient Chief Complaint Left LE ulcer Electronic Signature(s) Signed: 05/13/2022 8:12:32 AM By: Lenda Kelp PA-C Entered By: Lenda Kelp on 05/13/2022 08:12:32 [name] ([id number]) -------------------------------------------------------------------------------- HPI Details Patient Name: [name] Date of Service: 05/13/2022 8:00 AM Medical Record Number: [id number] Patient Account Number: [id number] Date of Birth/Sex: 1925-07-31 (88 y.o. F) Treating RN: Yevonne Pax Primary Care Provider: Merita Norton Other Clinician: Referring Provider: Merita Norton Treating Provider/Extender: Rowan Blase in Treatment: 7 History of Present Illness HPI Description: 03-19-2022 upon evaluation today patient appears to be doing poorly in regard to the left posterior lower extremity ulcer. This is something that she actually struck on a car door getting out of the car and this was around January 18, 2022. She was going to urgent care due to her leg swelling when this occurred. Since that time she has had a tremendous amount of bleeding she also did have recommendations for medicated gauze to be used by urgent care. Initially she actually apparently had sutures placed over this area but then when the sutures were removed this dehisced to some degree. She was placed on Omnicef in the past there is no signs of infection right now but again that is definitely something we will need to keep a close eye on especially with the depth of the  wound. The good news that she does not seem to be having any pain which is excellent. And again right now they have been using medicated gauze packing into the wound. She did have an x-ray on February 03, 2022 which was negative for any signs of bone abnormality which is great news. Patient does have a history of chronic venous insufficiency/hypertension with the lower extremity ulcer now at this point. She also has congestive heart failure, hypertension, she is on long- term anticoagulant therapy which is Aggrenox due to a pacemaker. 03-25-2022 upon evaluation today patient appears to be doing better in regard to the wound on her leg. She has been tolerating the dressing changes without complication. Fortunately I do not see any signs of active infection locally or systemically at this time which is great news. 04-01-2022 upon evaluation today patient appears to be doing well with regard to her right leg. Fortunately there does not appear to be any evidence of active infection locally nor systemically which is great news and overall I am extremely pleased with where we stand today. I do believe that she is making progress there is still some need for sharp debridement at this point. 04-08-2022 upon evaluation today patient actually appears to be making excellent progress. I am actually very pleased with where we stand today. I do not see any signs of active infection locally or systemically which is great news. No fevers, chills, nausea, vomiting, or diarrhea. 5/8 using endoform which was started last week. Wound appears smaller with less depth. 04-22-2022 upon evaluation today patient appears to be doing well in regard to a large portion of her wound although there is one area that does have me concerned about the possibility of skin cancer. I do think that we need to take a sample biopsy to send for  evaluation at this point. Patient is in agreement with that plan. Her family member who is with her  states that she is also prone to skin cancers she has had 1 recently removed at the skin surgery center in [place]. They did give [place] the number to that location if indeed we need to end up making a referral. For now we will get a see how things go and what the biopsy shows initially. 04-29-2022 upon evaluation today patient appears to be doing well currently in regard to her wound in fact it actually appears to be a lot better especially after last week's biopsy. Fortunately there does not appear to be any signs of active infection locally or systemically at this time which is great news. No fevers, chills, nausea, vomiting, or diarrhea. Upon inspection the patient actually showed signs of complete resolution based on what we are seeing currently. Fortunately I do not see any evidence of infection which is great news she does have an area that is distal to where she had the original wound and this is opened today and appears to be an area of irritation unfortunately. Fortunately there does not appear to be any signs of active infection locally or systemically which is great news. Her appointment is July 14 for the skin surgery center. Electronic Signature(s) Signed: 05/13/2022 8:44:02 AM By: Lenda Kelp PA-C Entered By: Lenda Kelp on 05/13/2022 08:44:02 [name] ([id number]) -------------------------------------------------------------------------------- Physical Exam Details Patient Name: [name] Date of Service: 05/13/2022 8:00 AM Medical Record Number: [id number] Patient Account Number: [id number] Date of Birth/Sex: Feb 11, 1925 (88 y.o. F) Treating RN: Yevonne Pax Primary Care Provider: Merita Norton Other Clinician: Referring Provider: Merita Norton Treating Provider/Extender: Rowan Blase in Treatment: 7 Constitutional Well-nourished and well-hydrated in no acute distress. Respiratory normal breathing without difficulty. Psychiatric this patient is able to make  decisions and demonstrates good insight into disease process. Alert and Oriented x 3. pleasant and cooperative. Notes Upon inspection patient's wound bed actually showed signs of good granulation at the incision at this point. Fortunately there does not appear to be any evidence of infection locally or systemically which is great news. No fevers, chills, nausea, vomiting, or diarrhea. This new area that is opened is very tiny and looks to be a blister with some irritation around on that a give her a steroid ointment which I think should help clear this up fairly quickly. Electronic Signature(s) Signed: 05/13/2022 8:44:31 AM By: Lenda Kelp PA-C Entered By: Lenda Kelp on 05/13/2022 08:44:31 [name] ([id number]) -------------------------------------------------------------------------------- Physician Orders Details Patient Name: [name] Date of Service: 05/13/2022 8:00 AM Medical Record Number: [id number] Patient Account Number: [id number] Date of Birth/Sex: [birth year] (88 y.o. 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 Home Health o Home Health Company: - [facility]-see changes to  orders-also biopsy taken at site 04/22/22 o  CONTINUE Home Health for wound care. May utilize formulary equivalent dressing for wound treatment orders unless otherwise specified. Home Health Nurse may visit PRN to address patientos wound care needs. - see updates o Scheduled days for dressing changes to be completed; exception, patient has scheduled wound care visit that day. - Three times a week. Once in clinic and twice by homehealth. o **Please direct any NON-WOUND related issues/requests for orders to patient's Primary Care Physician. **If current dressing causes regression in wound condition, may D/C ordered dressing product/s and apply Normal Saline Moist Dressing daily until next Wound Healing Center or Other MD appointment. **Notify Wound Healing Center of regression in wound condition at [phone]. Bathing/ Shower/ Hygiene o May shower; gently cleanse wound with antibacterial soap, rinse and pat dry prior to dressing wounds Anesthetic (Use 'Patient Medications' Section for Anesthetic Order Entry) o Lidocaine applied to wound bed Edema Control - Lymphedema / Segmental Compressive Device / Other o Tubigrip single layer applied. - D o Elevate, Exercise Daily and Avoid Standing for Long Periods of Time. o Elevate legs to the level of the heart and pump ankles as often as possible o Elevate leg(s) parallel to the floor when sitting. Wound Treatment Wound #2 - Lower Leg Wound Laterality: Left, Lateral Topical: Triamcinolone Acetonide Cream, 0.1%, 15 (g) tube Discharge Instructions: Apply as directed by provider. Primary Dressing: Silvercel Small 2x2 (in/in) Discharge Instructions: Apply Silvercel Small 2x2 (in/in) as instructed Compression Wrap: tubi grip D Patient Medications Allergies: No Known Drug Allergies Notifications Medication Indication Start End Boakye, Kylie StacksWILMA ([id number]) Notifications Medication Indication Start End triamcinolone acetonide 05/13/2022 DOSE  topical 0.1 % ointment - ointment topical applied in a thin film to the open wound and irritated skin of the right leg with each dressing change 3 times per week. Electronic Signature(s) Signed: 05/13/2022 8:47:45 AM By: Lenda KelpStone III, Bunnie Lederman PA-C Entered By: Lenda KelpStone III, Kiondra Caicedo on 05/13/2022 08:47:45 HopkinsHAYNES, Kylie StacksWILMA ([id number]) -------------------------------------------------------------------------------- Problem List Details Patient Name: Kylie CellaHAYNES, [name] Date of Service: 05/13/2022 8:00 AM Medical Record Number: [id number] Patient Account Number: [id number] Date of Birth/Sex: 07/09/1925 (88 y.o. F) Treating RN: Yevonne PaxEpps, Carrie Primary Care Provider: Merita NortonPayne, Elise Other Clinician: Referring Provider: Merita NortonPayne, Elise Treating Provider/Extender: Rowan BlaseStone, [place] Pacella Weeks in Treatment: 7 Active Problems ICD-10 Encounter Code Description Active Date MDM Diagnosis S81.812A Laceration without foreign body, left lower leg, initial encounter 03/19/2022 No Yes L97.822 Non-pressure chronic ulcer of other part of left lower leg with fat layer 03/19/2022 No Yes exposed I87.312 Chronic venous hypertension (idiopathic) with ulcer of left lower 03/19/2022 No Yes extremity C44.722 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, including hip 04/29/2022 No Yes I50.42 Chronic combined systolic (congestive) and diastolic (congestive) heart 03/19/2022 No Yes failure I10 Essential (primary) hypertension 03/19/2022 No Yes Z79.01 Long term (current) use of anticoagulants 03/19/2022 No Yes Z95.0 Presence of cardiac pacemaker 03/19/2022 No Yes Inactive Problems Resolved Problems Electronic Signature(s) Signed: 05/13/2022 8:12:28 AM By: Lenda KelpStone III, Nainoa Woldt PA-C Entered By: Lenda KelpStone III, Edilson Vital on 05/13/2022 08:12:28 Kylie CellaHAYNES, [name] ([id number]) -------------------------------------------------------------------------------- Progress Note Details Patient Name: Kylie CellaHAYNES, [name] Date of Service: 05/13/2022 8:00 AM Medical Record Number: [id number] Patient  Account Number: [id number] Date of Birth/Sex: 02/28/1925 (88 y.o. F) Treating RN: Yevonne PaxEpps, Carrie Primary Care Provider: Merita NortonPayne, Elise Other Clinician: Referring Provider: Merita NortonPayne, Elise Treating Provider/Extender: Rowan BlaseStone, Deno Sida Weeks in Treatment: 7 Subjective Chief Complaint Information obtained from Patient Left LE ulcer History of Present Illness (HPI) 03-19-2022 upon evaluation today patient appears to be doing poorly in regard to the left posterior  lower extremity ulcer. This is something that she actually struck on a car door getting out of the car and this was around January 18, 2022. She was going to urgent care due to her leg swelling when this occurred. Since that time she has had a tremendous amount of bleeding she also did have recommendations for medicated gauze to be used by urgent care. Initially she actually apparently had sutures placed over this area but then when the sutures were removed this dehisced to some degree. She was placed on Omnicef in the past there is no signs of infection right now but again that is definitely something we will need to keep a close eye on especially with the depth of the wound. The good news that she does not seem to be having any pain which is excellent. And again right now they have been using medicated gauze packing into the wound. She did have an x-ray on February 03, 2022 which was negative for any signs of bone abnormality which is great news. Patient does have a history of chronic venous insufficiency/hypertension with the lower extremity ulcer now at this point. 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 03-25-2022 upon evaluation today patient appears to be doing better in regard to the wound on her leg. She has been tolerating the dressing changes without complication. Fortunately I do not see any signs of active infection locally or systemically at this  time which is great news. 04-01-2022 upon evaluation today patient appears to be doing well with regard to her right leg. Fortunately there does not appear to be any evidence of active infection locally nor systemically which is great news and overall I am extremely pleased with where we stand today. I do believe that she is making progress there is still some need for sharp debridement at this point. 04-08-2022 upon evaluation today patient actually appears to be making excellent progress. I am actually very pleased with where we stand today. I do not see any signs of active infection locally or systemically which is great news. No fevers, chills, nausea, vomiting, or diarrhea. 5/8 using endoform which was started last week. Wound appears smaller with less depth. 04-22-2022 upon evaluation today patient appears to be doing well in regard to a large portion of her wound although there is one area that does have me concerned about the possibility of skin cancer. I do think that we need to take a sample biopsy to send for evaluation at this point. Patient is in agreement with that plan. Her family member who is with her states that she is also prone to skin cancers she has had 1 recently removed at the skin surgery center in [place]. They did give [place] the number to that location if indeed we need to end up making a referral. For now we will get a see how things go and what the biopsy shows initially. 04-29-2022 upon evaluation today patient appears to be doing well currently in regard to her wound in fact it actually appears to be a lot better especially after last week's biopsy. Fortunately there does not appear to be any signs of active infection locally or systemically at this time which is great news. No fevers, chills, nausea, vomiting, or diarrhea. Upon inspection the patient actually showed signs of complete resolution based on what we are seeing currently. Fortunately I do not see any evidence  of infection which is  great news she does have an area that is distal to where she had the original wound and this is opened today and appears to be an area of irritation unfortunately. Fortunately there does not appear to be any signs of active infection locally or systemically which is great news. Her appointment is July 14 for the skin surgery center. Objective Constitutional Well-nourished and well-hydrated in no acute distress. Vitals Time Taken: 8:18 AM, Height: 59 in, Weight: 107 lbs, BMI: 21.6, Temperature: 97.5 F, Pulse: 82 bpm, Respiratory Rate: 18 breaths/min, Blood Pressure: 137/74 mmHg. Respiratory normal breathing without difficulty. [place], [name] ([id number]) Psychiatric this patient is able to make decisions and demonstrates good insight into disease process. Alert and Oriented x 3. pleasant and cooperative. General Notes: Upon inspection patient's wound bed actually showed signs of good granulation at the incision at this point. Fortunately there does not appear to be any evidence of infection locally or systemically which is great news. No fevers, chills, nausea, vomiting, or diarrhea. This new area that is opened is very tiny and looks to be a blister with some irritation around on that a give her a steroid ointment which I think should help clear this up fairly quickly. Integumentary (Hair, Skin) Wound #1 status is Open. Original cause of wound was Trauma. The date acquired was: 01/18/2022. The wound has been in treatment 7 weeks. The wound is located on the Left,Posterior Lower Leg. The wound measures 0cm length x 0cm width x 0cm depth; 0cm^2 area and 0cm^3 volume. There is no tunneling or undermining noted. There is a none present amount of drainage noted. There is no granulation within the wound bed. There is no necrotic tissue within the wound bed. Wound #2 status is Open. Original cause of wound was Gradually Appeared. The date acquired was: 05/11/2022. The wound is  located on the Left,Lateral Lower Leg. The wound measures 0.6cm length x 0.7cm width x 0.1cm depth; 0.33cm^2 area and 0.033cm^3 volume. There is Fat Layer (Subcutaneous Tissue) exposed. There is no tunneling or undermining noted. There is a medium amount of serosanguineous drainage noted. There is large (67-100%) red granulation within the wound bed. 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 Home Health: Home Health Company: - [facility]-see changes to orders-also biopsy taken at site 04/22/22 [facility] for wound care. May utilize formulary equivalent dressing for wound treatment orders unless otherwise specified. Home Health Nurse may visit PRN to address patient s wound care needs. - see updates Scheduled days for dressing changes to be completed; exception, patient has scheduled wound care visit that day. - Three times a week. Once in clinic and twice by homehealth. **Please direct any NON-WOUND related issues/requests for orders to patient's Primary Care Physician. **If current dressing causes regression in wound condition, may D/C ordered dressing product/s and apply Normal Saline Moist Dressing daily until next Wound Healing Center or Other MD appointment. **Notify Wound Healing Center of regression in wound condition at [phone]. Bathing/ Shower/ Hygiene: May shower; gently cleanse wound with antibacterial soap, rinse and pat dry prior to dressing  wounds Anesthetic (Use '  Patient Medications' Section for Anesthetic Order Entry): Lidocaine applied to wound bed Edema Control - Lymphedema / Segmental Compressive Device / Other: Tubigrip single layer applied. - D Elevate, Exercise Daily and Avoid Standing for Long Periods of Time. Elevate legs to the level of the heart and pump ankles as often as possible Elevate leg(s) parallel to the floor when sitting. The following medication(s) was prescribed: triamcinolone acetonide topical 0.1 % ointment ointment topical applied in a thin film to the open wound and irritated skin of the right leg with each dressing change 3 times per week. starting 05/13/2022 WOUND #2: - Lower Leg Wound Laterality: Left, Lateral Topical: Triamcinolone Acetonide Cream, 0.1%, 15 (g) tube Discharge Instructions: Apply as directed by provider. Primary Dressing: Silvercel Small 2x2 (in/in) Discharge Instructions: Apply Silvercel Small 2x2 (in/in) as instructed Compression Wrap: tubi grip D [name], [name] ([id number]) 1. I am going to go ahead and prescribe triamcinolone ointment for the patient which I think will be beneficial and the patient is in agreement with that plan. 2. I am going to recommend that we have the patient continue to monitor for any signs of worsening or infection. Obviously if anything changes she should let me know. Otherwise my hope is that with the triamcinolone over this wound using the alginate will be able to get this closed fairly quickly. 3. We will use the Tubigrip D but no gauze and gauze may have been irritating. We will see patient back for reevaluation in 1 week here in the clinic. If anything worsens or changes patient will contact our office for additional recommendations. Electronic Signature(s) Signed: 05/13/2022 8:48:02 AM By: Lenda Kelp PA-C Entered By: Lenda Kelp on 05/13/2022 08:48:02 [name], [name]  ([id number]) -------------------------------------------------------------------------------- SuperBill Details Patient Name: [name] Date of Service: 05/13/2022 Medical Record Number: [id number] Patient Account Number: [id number] Date of Birth/Sex: 1925/06/22 (88 y.o. 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 I10 Essential (primary) hypertension Z79.01 Long term (current) use of anticoagulants Z95.0 Presence of cardiac pacemaker Facility Procedures CPT4 Code: [id number] Description: [phone] - WOUND CARE VISIT-LEV 2 EST PT Modifier: Quantity: 1 Physician Procedures CPT4 Code: [id number] Description: 99213 - WC PHYS LEVEL 3 - EST PT Modifier: Quantity: 1 CPT4 Code: Description: ICD-10 Diagnosis Description S81.812A Laceration without foreign body, left lower leg, initial encounter L97.822 Non-pressure chronic ulcer of other part of left lower leg with fat lay I87.312 Chronic venous hypertension (idiopathic) with  ulcer of left lower extre C44.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

## 2022-05-14 ENCOUNTER — Telehealth: Payer: Self-pay

## 2022-05-14 NOTE — Telephone Encounter (Signed)
Copied from CRM [id number]. Topic: General - Other >> May 14, 2022  4:18 PM Marylen Ponto wrote: Reason for CRM: Pt daughter Hal Morales requests that all patient prescriptions be sent to [facility]. [name] stated that all the patient prescriptions prescribed by her previous provider are not being sent to [facility] so she really needs this to be corrected asap.

## 2022-05-15 NOTE — Telephone Encounter (Signed)
Spoke with daughter and explained that a ROI needs to be signed in order to transfer records. It is filled out and ready for her signature at her convenience.

## 2022-05-17 ENCOUNTER — Other Ambulatory Visit: Payer: Self-pay | Admitting: Family Medicine

## 2022-05-22 ENCOUNTER — Ambulatory Visit (INDEPENDENT_AMBULATORY_CARE_PROVIDER_SITE_OTHER): Payer: Medicare PPO | Admitting: Cardiology

## 2022-05-22 ENCOUNTER — Encounter: Payer: Self-pay | Admitting: Cardiology

## 2022-05-22 VITALS — BP 114/70 | HR 80 | Ht 59.0 in | Wt 108.2 lb

## 2022-05-22 DIAGNOSIS — Z950 Presence of cardiac pacemaker: Secondary | ICD-10-CM

## 2022-05-22 DIAGNOSIS — I5032 Chronic diastolic (congestive) heart failure: Secondary | ICD-10-CM

## 2022-05-22 DIAGNOSIS — I4821 Permanent atrial fibrillation: Secondary | ICD-10-CM | POA: Diagnosis not present

## 2022-05-22 NOTE — Patient Instructions (Addendum)
Medications: Your physician recommends that you continue on your current medications as directed. Please refer to the Current Medication list given to you today. *If you need a refill on your cardiac medications before your next appointment, please call your pharmacy*  Lab Work: None. If you have labs (blood work) drawn today and your tests are completely normal, you will receive your results only by: MyChart Message (if you have MyChart) OR A paper copy in the mail If you have any lab test that is abnormal or we need to change your treatment, we will call you to review the results.  Testing/Procedures: None.  Follow-Up: At [facility], you and your health needs are our priority.  As part of our continuing mission to provide you with exceptional heart care, we have created designated Provider Care Teams.  These Care Teams include your primary Cardiologist (physician) and Advanced Practice Providers (APPs -  Physician Assistants and Nurse Practitioners) who all work together to provide you with the care you need, when you need it.  Your physician wants you to follow-up in: 12 months with Cameron Lambert, MD     You will receive a reminder letter in the mail two months in advance. If you don't receive a letter, please call our office to schedule the follow-up appointment.  We recommend signing up for the patient portal called "MyChart".  Sign up information is provided on this After Visit Summary.  MyChart is used to connect with patients for Virtual Visits (Telemedicine).  Patients are able to view lab/test results, encounter notes, upcoming appointments, etc.  Non-urgent messages can be sent to your provider as well.   To learn more about what you can do with MyChart, go to [email or web address].com.    Any Other Special Instructions Will Be Listed Below (If Applicable).  

## 2022-05-22 NOTE — Progress Notes (Signed)
Electrophysiology Office Note:    Date:  05/22/2022   ID:  [name], DOB Nov 30, 1925, MRN [id number]  PCP:  Gwyneth Sprout, [facility] Cardiologist:  None  [facility] Electrophysiologist:  Vickie Epley, MD   Referring MD: Kate Sable, MD   Chief Complaint: Pacemaker in situ  History of Present Illness:    [name] is a 88 y.o. female who presents for an evaluation of pacemaker at the request of Dr. Garen Lah. Their medical history includes congestive heart failure, hypertension, permanent atrial fibrillation, permanent pacemaker in situ.  The patient was seen by Dr. Garen Lah March 22, 2022.  The permanent pacemaker was implanted in 2007 with an apparent generator change in 2015.  She is not on anticoagulation given a history of macular degeneration and falls.    Past Medical History:  Diagnosis Date   Arrhythmia    atrial fibrillation   Congestive heart failure (CHF) ([facility])    Hypertension    Pacemaker 2015    Past Surgical History:  Procedure Laterality Date   ABDOMINAL HYSTERECTOMY     EYE SURGERY     MOHS SURGERY  2023   PACEMAKER PLACEMENT N/A 2007   2017    Current Medications: Current Meds  Medication Sig   amLODipine (NORVASC) 5 MG tablet TAKE 1 TABLET BY MOUTH EVERY DAY   atorvastatin (LIPITOR) 10 MG tablet Take 10 mg by mouth daily.   calcium carbonate (OS-CAL - DOSED IN MG OF ELEMENTAL CALCIUM) 1250 (500 Ca) MG tablet Take 1 tablet by mouth 2 (two) times daily with a meal.   dipyridamole-aspirin (AGGRENOX) 200-25 MG 12hr capsule Take 1 capsule by mouth 2 (two) times daily.   empagliflozin (JARDIANCE) 10 MG TABS tablet Take 1 tablet (10 mg total) by mouth daily before breakfast.   loratadine (CLARITIN) 10 MG tablet Take 10 mg by mouth daily.   losartan (COZAAR) 50 MG tablet Take 50 mg by mouth 2 (two) times daily.   metoprolol (TOPROL-XL) 200 MG 24 hr tablet Take 200 mg by mouth daily.   Multiple Vitamins-Minerals  (PRESERVISION AREDS 2 PO) Take 1 tablet by mouth in the morning and at bedtime.   multivitamin-iron-minerals-folic acid (CENTRUM) chewable tablet Chew 1 tablet by mouth daily.   Omega-3 Fatty Acids (FISH OIL) 1000 MG CAPS Take 1 capsule by mouth daily.   potassium chloride (KLOR-CON) 10 MEQ tablet Take 10 mEq by mouth as needed. Only if she takes an additional dose of lasix   torsemide (DEMADEX) 20 MG tablet Take 1 tablet (20 mg total) by mouth daily. And additional 20mg  if needed     Allergies:   Moxifloxacin, Tobramycin, and Codeine   Social History   Socioeconomic History   Marital status: Widowed    Spouse name: Not on file   Number of children: Not on file   Years of education: Not on file   Highest education level: Not on file  Occupational History   Not on file  Tobacco Use   Smoking status: Never   Smokeless tobacco: Never  Substance and Sexual Activity   Alcohol use: Not Currently   Drug use: Never   Sexual activity: Not on file  Other Topics Concern   Not on file  Social History Narrative   Not on file   Social Determinants of Health   Financial Resource Strain: Not on file  Food Insecurity: Not on file  Transportation Needs: Not on file  Physical Activity: Not on file  Stress:  Not on file  Social Connections: Not on file     Family History: The patient's family history includes Heart attack in her daughter and father; Heart failure in her mother.  ROS:   Please see the history of present illness.    All other systems reviewed and are negative.  EKGs/Labs/Other Studies Reviewed:    The following studies were reviewed today:   May 22, 2022 in clinic device interrogation personally reviewed Battery longevity 3 years Lead parameters stable Ventricular paced 91.3%  EKG:  The ekg ordered today demonstrates ventricular paced rhythm   Recent Labs: 02/03/2022: B Natriuretic Peptide 648.1; Hemoglobin 10.7; Platelets 198 04/08/2022: BUN 51; Creatinine, Ser  0.97; Potassium 3.4; Sodium 141  Recent Lipid Panel No results found for: "CHOL", "TRIG", "HDL", "CHOLHDL", "VLDL", "LDLCALC", "LDLDIRECT"  Physical Exam:    VS:  BP 114/70   Pulse 80   Ht 4\' 11"  (1.499 m)   Wt 108 lb 3.2 oz (49.1 kg)   LMP  (LMP Unknown)   SpO2 98%   BMI 21.85 kg/m     Wt Readings from Last 3 Encounters:  05/22/22 108 lb 3.2 oz (49.1 kg)  04/24/22 109 lb (49.4 kg)  04/10/22 107 lb (48.5 kg)     GEN: Elderly.  No distress.   HEENT: Normal NECK: No JVD; No carotid bruits LYMPHATICS: No lymphadenopathy CARDIAC: Regular rhythm, no murmurs, rubs, gallops.  Pacemaker pocket well-healed RESPIRATORY:  Clear to auscultation without rales, wheezing or rhonchi  ABDOMEN: Soft, non-tender, non-distended MUSCULOSKELETAL:  No edema; No deformity  SKIN: Warm and dry NEUROLOGIC:  Alert and oriented x 3 PSYCHIATRIC:  Normal affect       ASSESSMENT:    1. Pacemaker   2. Permanent atrial fibrillation ([place])   3. Chronic heart failure with preserved ejection fraction (HCC)    PLAN:    In order of problems listed above:  #Permanent atrial fibrillation On aspirin-dipyridamole.  Not currently on anticoagulation given gait instability and risk of falls.  #Pacemaker in situ Pacemaker functioning appropriately.  We will establish our pacemaker clinic for routine monitoring.  #Chronic diastolic heart failure NYHA class I-II.  Warm and dry on exam.  Continue current medication regimen.  Continue follow-up with Dr. Garen Lah.   Medication Adjustments/Labs and Tests Ordered: Current medicines are reviewed at length with the patient today.  Concerns regarding medicines are outlined above.  No orders of the defined types were placed in this encounter.  No orders of the defined types were placed in this encounter.    Signed, Hilton Cork. Quentin Ore, MD, [facility], [facility] 05/22/2022 9:34 PM    Electrophysiology [facility]

## 2022-05-24 ENCOUNTER — Telehealth: Payer: Self-pay

## 2022-05-24 NOTE — Telephone Encounter (Signed)
Kim called with the patient pacemaker/leads model/serial numbers. I was able to request the patient to be in our system. She is in [employer]. Waiting on [facility] to release her in [facility].

## 2022-05-24 NOTE — Telephone Encounter (Signed)
I called the patient to get her pacemaker information.   I spoke with the patient daughter Sumner Boast (dpr) I am going to call her on Monday to get the patient pacemaker information.

## 2022-05-25 ENCOUNTER — Other Ambulatory Visit: Payer: Self-pay | Admitting: Family

## 2022-05-27 ENCOUNTER — Ambulatory Visit: Payer: Self-pay

## 2022-05-27 ENCOUNTER — Encounter: Payer: Medicare PPO | Admitting: Physician Assistant

## 2022-05-27 DIAGNOSIS — I87312 Chronic venous hypertension (idiopathic) with ulcer of left lower extremity: Secondary | ICD-10-CM | POA: Diagnosis not present

## 2022-05-27 NOTE — Telephone Encounter (Signed)
Pt's daughter called, unable to leave VM d/t mailbox full.   Summary: Needs antibiotic, declined appt offers   Pt's daughter called and is requesting for the patient to receive an antibiotic for a lingering cough that is productive. Pt does not want to bring patient in for an appt, pt has other doctor's appts today. Pt's daughter is willing to schedule an appt if necessary but does not have much available time. She also declined virtual appt offer along with declining in office appts as well. Please advise   [id number] [address], [place] - [address]  [place] [address]  Phone: [phone] Fax: [phone]   Best contact: [phone]

## 2022-05-27 NOTE — Telephone Encounter (Signed)
Attempted to return call. Mailbox is full 

## 2022-05-27 NOTE — Progress Notes (Signed)
Kylie CellaHAYNES, Joycie ([id number]) Visit Report for 05/27/2022 Chief Complaint Document Details Patient Name: Kylie CellaHAYNES, Danila Date of Service: 05/27/2022 3:15 PM Medical Record Number: [id number] Patient Account Number: [id number] Date of Birth/Sex: 11/05/1925 (88 y.o. F) Treating RN: Angelina PihGordon, Caitlin Primary Care Provider: Merita NortonPayne, Elise Other Clinician: Referring Provider: Merita NortonPayne, Elise Treating Provider/Extender: Rowan BlaseStone, Mylena Sedberry Weeks in Treatment: 9 Information Obtained from: Patient Chief Complaint Left LE ulcer Electronic Signature(s) Signed: 05/27/2022 3:23:48 PM By: Lenda KelpStone III, Kalel Harty PA-C Entered By: Lenda KelpStone III, Wanna Gully on 05/27/2022 15:23:48 Kylie CellaHAYNES, [name] ([id number]) -------------------------------------------------------------------------------- HPI Details Patient Name: Kylie CellaHAYNES, [name] Date of Service: 05/27/2022 3:15 PM Medical Record Number: [id number] Patient Account Number: [id number] Date of Birth/Sex: 12/10/1924 (88 y.o. F) Treating RN: Angelina PihGordon, Caitlin Primary Care Provider: Merita NortonPayne, Elise Other Clinician: Referring Provider: Merita NortonPayne, Elise Treating Provider/Extender: Rowan BlaseStone, Ande Therrell Weeks in Treatment: 9 History of Present Illness HPI Description: 03-19-2022 upon evaluation today patient appears to be doing poorly in regard to the left posterior lower extremity ulcer. This is something that she actually struck on a car door getting out of the car and this was around January 18, 2022. She was going to urgent care due to her leg swelling when this occurred. Since that time she has had a tremendous amount of bleeding she also did have recommendations for medicated gauze to be used by urgent care. Initially she actually apparently had sutures placed over this area but then when the sutures were removed this dehisced to some degree. She was placed on Omnicef in the past there is no signs of infection right now but again that is definitely something we will need to keep a close eye on especially with the depth of  the wound. The good news that she does not seem to be having any pain which is excellent. And again right now they have been using medicated gauze packing into the wound. She did have an x-ray on February 03, 2022 which was negative for any signs of bone abnormality which is great news. Patient does have a history of chronic venous insufficiency/hypertension with the lower extremity ulcer now at this point. She also has congestive heart failure, hypertension, she is on long- term anticoagulant therapy which is Aggrenox due to a pacemaker. 03-25-2022 upon evaluation today patient appears to be doing better in regard to the wound on her leg. She has been tolerating the dressing changes without complication. Fortunately I do not see any signs of active infection locally or systemically at this time which is great news. 04-01-2022 upon evaluation today patient appears to be doing well with regard to her right leg. Fortunately there does not appear to be any evidence of active infection locally nor systemically which is great news and overall I am extremely pleased with where we stand today. I do believe that she is making progress there is still some need for sharp debridement at this point. 04-08-2022 upon evaluation today patient actually appears to be making excellent progress. I am actually very pleased with where we stand today. I do not see any signs of active infection locally or systemically which is great news. No fevers, chills, nausea, vomiting, or diarrhea. 5/8 using endoform which was started last week. Wound appears smaller with less depth. 04-22-2022 upon evaluation today patient appears to be doing well in regard to a large portion of her wound although there is one area that does have me concerned about the possibility of skin cancer. I do think that we need to take a sample biopsy to send for  evaluation at this point. Patient is in agreement with that plan. Her family member who is with her  states that she is also prone to skin cancers she has had 1 recently removed at the skin surgery center in [place]. They did give [place] the number to that location if indeed we need to end up making a referral. For now we will get a see how things go and what the biopsy shows initially. 04-29-2022 upon evaluation today patient appears to be doing well currently in regard to her wound in fact it actually appears to be a lot better especially after last week's biopsy. Fortunately there does not appear to be any signs of active infection locally or systemically at this time which is great news. No fevers, chills, nausea, vomiting, or diarrhea. Upon inspection the patient actually showed signs of complete resolution based on what we are seeing currently. Fortunately I do not see any evidence of infection which is great news she does have an area that is distal to where she had the original wound and this is opened today and appears to be an area of irritation unfortunately. Fortunately there does not appear to be any signs of active infection locally or systemically which is great news. Her appointment is July 14 for the skin surgery center.Marland Kitchen 05-27-2022 upon evaluation today patient appears to be doing well currently in regard to her wound in fact this appears to be completely healed which is great news. I see nothing open including the area where she had the skin cancer this is actually completely healed. In fact I cannot even tell personally where this was biopsied previously. Electronic Signature(s) Signed: 05/27/2022 4:06:26 PM By: Lenda Kelp PA-C Previous Signature: 05/27/2022 3:44:49 PM Version By: Lenda Kelp PA-C Entered By: Lenda Kelp on 05/27/2022 16:06:25 [name] ([id number]) -------------------------------------------------------------------------------- Physical Exam Details Patient Name: [name] Date of Service: 05/27/2022 3:15 PM Medical Record Number:  [id number] Patient Account Number: [id number] Date of Birth/Sex: 1925/05/22 (88 y.o. F) Treating RN: Angelina Pih Primary Care Provider: Merita Norton Other Clinician: Referring Provider: Merita Norton Treating Provider/Extender: Rowan Blase in Treatment: 9 Constitutional Well-nourished and well-hydrated in no acute distress. Respiratory normal breathing without difficulty. Psychiatric this patient is able to make decisions and demonstrates good insight into disease process. Alert and Oriented x 3. pleasant and cooperative. Notes Upon inspection patient's wound bed actually showed signs of good granulation and epithelization in fact everything is completely closed and epithelialized at this time. I see no signs of active infection locally or systemically at this time. Electronic Signature(s) Signed: 05/27/2022 4:06:40 PM By: Lenda Kelp PA-C Entered By: Lenda Kelp on 05/27/2022 16:06:39 [name] ([id number]) -------------------------------------------------------------------------------- Physician Orders Details Patient Name: [name] Date of Service: 05/27/2022 3:15 PM Medical Record Number: [id number] Patient Account Number: [id number] Date of Birth/Sex: September 01, 1925 (88 y.o. F) Treating RN: Angelina Pih Primary Care Provider: Merita Norton Other Clinician: Referring Provider: Merita Norton Treating Provider/Extender: Rowan Blase in Treatment: 9 Verbal / Phone Orders: No Diagnosis Coding ICD-10 Coding Code Description [phone] Laceration without foreign body, left lower leg, initial encounter L97.822 Non-pressure chronic ulcer of other part of left lower leg with fat layer exposed I87.312 Chronic venous hypertension (idiopathic) with ulcer of left lower extremity C44.722 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, including hip I50.42 Chronic combined systolic (congestive) and diastolic (congestive) heart failure I10 Essential (primary)  hypertension Z79.01 Long term (current) use of anticoagulants Z95.0 Presence of  cardiac pacemaker Discharge From [facility] o Discharge from Wound Care Center Treatment Complete - Please call if there are any issues with healed wound. Monitor area of skin CA and call their office and follow up with appointment as you feel fit. o Wear compression garments daily. Put garments on first thing when you wake up and remove them before bed. o Moisturize legs daily after removing compression garments. o Elevate, Exercise Daily and Avoid Standing for Long Periods of Time. o DO YOUR BEST to sleep in the bed at night. DO NOT sleep in your recliner. Long hours of sitting in a recliner leads to swelling of the legs and/or potential wounds on your backside. Home Health o Home Health Company: [facility] o DISCONTINUE Home Health for Wound Care. - wound care no longer needed Electronic Signature(s) Signed: 05/27/2022 4:09:46 PM By: Angelina Pih Signed: 05/29/2022 8:55:04 AM By: Lenda Kelp PA-C Entered By: Angelina Pih on 05/27/2022 15:59:04 [name] ([id number]) -------------------------------------------------------------------------------- Problem List Details Patient Name: [name] Date of Service: 05/27/2022 3:15 PM Medical Record Number: [id number] Patient Account Number: [id number] Date of Birth/Sex: 28-Jul-1925 (88 y.o. F) Treating RN: Angelina Pih Primary Care Provider: Merita Norton Other Clinician: Referring Provider: Merita Norton Treating Provider/Extender: Rowan Blase in Treatment: 9 Active Problems ICD-10 Encounter Code Description Active Date MDM Diagnosis S81.812A Laceration without foreign body, left lower leg, initial encounter 03/19/2022 No Yes L97.822 Non-pressure chronic ulcer of other part of left lower leg with fat layer 03/19/2022 No Yes exposed I87.312 Chronic venous hypertension (idiopathic) with ulcer of left lower 03/19/2022 No  Yes extremity C44.722 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, including hip 04/29/2022 No Yes I50.42 Chronic combined systolic (congestive) and diastolic (congestive) heart 03/19/2022 No Yes failure I10 Essential (primary) hypertension 03/19/2022 No Yes Z79.01 Long term (current) use of anticoagulants 03/19/2022 No Yes Z95.0 Presence of cardiac pacemaker 03/19/2022 No Yes Inactive Problems Resolved Problems Electronic Signature(s) Signed: 05/27/2022 3:23:44 PM By: Lenda Kelp PA-C Entered By: Lenda Kelp on 05/27/2022 15:23:43 [name] ([id number]) -------------------------------------------------------------------------------- Progress Note Details Patient Name: [name] Date of Service: 05/27/2022 3:15 PM Medical Record Number: [id number] Patient Account Number: [id number] Date of Birth/Sex: [birth year] (88 y.o. F) Treating RN: Angelina Pih Primary Care Provider: Merita Norton Other Clinician: Referring Provider: Merita Norton Treating Provider/Extender: Rowan Blase in Treatment: 9 Subjective Chief Complaint Information obtained from Patient Left LE ulcer History of Present Illness (HPI) 03-19-2022 upon evaluation today patient appears to be doing poorly in regard to the left posterior lower extremity ulcer. This is something that she actually struck on a car door getting out of the car and this was around January 18, 2022. She was going to urgent care due to her leg swelling when this occurred. Since that time she has had a tremendous amount of bleeding she also did have recommendations for medicated gauze to be used by urgent care. Initially she actually apparently had sutures placed over this area but then when the sutures were removed this dehisced to some degree. She was placed on Omnicef in the past there is no signs of infection right now but again that is definitely something we will need to keep a close eye on especially with the depth of the  wound. The good news that she does not seem to be having any pain which is excellent. And again right now they have been using medicated gauze packing into the wound. She did have an x-ray on February 03, 2022 which was negative for any signs of bone abnormality which is great news. Patient does have a history of chronic venous insufficiency/hypertension with the lower extremity ulcer now at this point. 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 03-25-2022 upon evaluation today patient appears to be doing better in regard to the wound on her leg. She has been tolerating the dressing changes without complication. Fortunately I do not see any signs of active infection locally or systemically at this time which is great news. 04-01-2022 upon evaluation today patient appears to be doing well with regard to her right leg. Fortunately there does not appear to be any evidence of active infection locally nor systemically which is great news and overall I am extremely pleased with where we stand today. I do believe that she is making progress there is still some need for sharp debridement at this point. 04-08-2022 upon evaluation today patient actually appears to be making excellent progress. I am actually very pleased with where we stand today. I do not see any signs of active infection locally or systemically which is great news. No fevers, chills, nausea, vomiting, or diarrhea. 5/8 using endoform which was started last week. Wound appears smaller with less depth. 04-22-2022 upon evaluation today patient appears to be doing well in regard to a large portion of her wound although there is one area that does have me concerned about the possibility of skin cancer. I do think that we need to take a sample biopsy to send for evaluation at this point. Patient is in agreement with that plan. Her family member who is with her states  that she is also prone to skin cancers she has had 1 recently removed at the skin surgery center in [place]. They did give [place] the number to that location if indeed we need to end up making a referral. For now we will get a see how things go and what the biopsy shows initially. 04-29-2022 upon evaluation today patient appears to be doing well currently in regard to her wound in fact it actually appears to be a lot better especially after last week's biopsy. Fortunately there does not appear to be any signs of active infection locally or systemically at this time which is great news. No fevers, chills, nausea, vomiting, or diarrhea. Upon inspection the patient actually showed signs of complete resolution based on what we are seeing currently. Fortunately I do not see any evidence of infection which is great news she does have an area that is distal to where she had the original wound and this is opened today and appears to be an area of irritation unfortunately. Fortunately there does not appear to be any signs of active infection locally or systemically which is great news. Her appointment is July 14 for the skin surgery center.Marland Kitchen 05-27-2022 upon evaluation today patient appears to be doing well currently in regard to her wound in fact this appears to be completely healed which is great news. I see nothing open including the area where she had the skin cancer this is actually completely healed. In fact I cannot even tell personally where this was biopsied previously. Objective Constitutional Well-nourished and well-hydrated in no acute distress. Vitals Time Taken: 3:30 PM, Height: 59 in, Weight: 107 lbs, BMI: 21.6, Temperature: 97.5 F, Pulse: 87 bpm, Respiratory Rate: 18 breaths/min, Blood Pressure: 105/64 mmHg. Kitch, [name] ([id number]) Respiratory normal breathing without difficulty. Psychiatric  this patient is able to make decisions and demonstrates good insight into disease process.  Alert and Oriented x 3. pleasant and cooperative. General Notes: Upon inspection patient's wound bed actually showed signs of good granulation and epithelization in fact everything is completely closed and epithelialized at this time. I see no signs of active infection locally or systemically at this time. Integumentary (Hair, Skin) Wound #2 status is Healed - Epithelialized. Original cause of wound was Gradually Appeared. The date acquired was: 05/11/2022. The wound has been in treatment 2 weeks. The wound is located on the Left,Lateral Lower Leg. The wound measures 0cm length x 0cm width x 0cm depth; 0cm^2 area and 0cm^3 volume. There is no tunneling or undermining noted. There is a none present amount of drainage noted. There is no granulation within the wound bed. There is no necrotic tissue within the wound bed. 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 Monitor area of skin CA and call their office and follow up with appointment as you feel fit. Wear compression garments daily. Put garments on first thing when you wake up and remove them before bed. Moisturize legs daily after removing compression garments. Elevate, Exercise Daily and Avoid Standing for Long Periods of Time. DO YOUR BEST to sleep in the bed at night. DO NOT sleep in your recliner. Long hours of sitting in a recliner  leads to swelling of the legs and/or potential wounds on your backside. Home Health: Home Health Company: - [facility] DISCONTINUE Home Health for Wound Care. - wound care no longer needed 1. Based on what I am seeing currently the patient appears to be completely healed which is great news I am very pleased in that regard. 2. With regard to the skin cancer that was identified on biopsy this actually appears completely healed in fact I cannot personally even find the area that I biopsied is healed so well. With that being said given her age getting ready to be 89 coupled with how well this is doing and actually had a conversation with the patient and her daughter today about the fact that they may want to at least consider the possibility of not going forward with the skin surgery center. Again I explained I am not the expert on skin cancers but at the same time this is likely to be more slow- growing and if indeed she does develop an issue here it would be unlikely to occur rapidly and most likely was still present at time. With which she could get an appointment to have that removed. They voiced understanding and in the end what they are going to do as the daughter is going to contact the skin surgery center explaining what is going on at [facility] office of color pictures of the area that we biopsied and they will subsequently talk to them about options and where they should go from here. We will see her back for follow-up visit as needed. Electronic Signature(s) Signed: 05/27/2022 4:08:27 PM By: Lenda Kelp  PA-C Entered By: Lenda Kelp on 05/27/2022 16:08:27 [name] ([id number]) -------------------------------------------------------------------------------- SuperBill Details Patient Name: [name] Date of Service: 05/27/2022 Medical Record Number: [id number] Patient Account Number: [id number] Date of Birth/Sex: June 21, 1925 (88 y.o. F) Treating RN: Angelina Pih Primary Care  Provider: Merita Norton Other Clinician: Referring Provider: Merita Norton Treating Provider/Extender: Rowan Blase in Treatment: 9 Diagnosis Coding ICD-10 Codes Code Description [id number] Laceration without foreign body, left lower leg, initial encounter L97.822 Non-pressure chronic ulcer of other part of left lower leg with fat layer exposed I87.312 Chronic venous hypertension (idiopathic) with ulcer of left lower extremity C44.722 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, including hip I50.42 Chronic combined systolic (congestive) and diastolic (congestive) heart failure I10 Essential (primary) hypertension Z79.01 Long term (current) use of anticoagulants Z95.0 Presence of cardiac pacemaker Facility Procedures CPT4 Code: [id number] Description: [phone] - WOUND CARE VISIT-LEV 2 EST PT Modifier: Quantity: 1 Physician Procedures CPT4 Code: [id number] Description: 99213 - WC PHYS LEVEL 3 - EST PT Modifier: Quantity: 1 CPT4 Code: Description: ICD-10 Diagnosis Description S81.812A Laceration without foreign body, left lower leg, initial encounter L97.822 Non-pressure chronic ulcer of other part of left lower leg with fat lay I87.312 Chronic venous hypertension (idiopathic) with  ulcer of left lower extre C44.722 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, including hip Modifier: er exposed mity Quantity: Electronic Signature(s) Signed: 05/27/2022 4:18:15 PM By: Lenda Kelp PA-C Previous Signature: 05/27/2022 4:05:05 PM Version By: Angelina Pih Entered By: Lenda Kelp on 05/27/2022 16:18:15

## 2022-05-27 NOTE — Progress Notes (Addendum)
Kylie, [name] ([id number]) Visit Report for 05/27/2022 Arrival Information Details Patient Name: [name], [name] Date of Service: 05/27/2022 3:15 PM Medical Record Number: [id number] Patient Account Number: [id number] Date of Birth/Sex: 09-Nov-1925 (88 y.o. F) Treating RN: Angelina Pih Primary Care Kolbe Delmonaco: Merita Norton Other Clinician: Referring Nana Vastine: Merita Norton Treating Lexany Belknap/Extender: Rowan Blase in Treatment: 9 Visit Information History Since Last Visit Added or deleted any medications: No Patient Arrived: [name] Any new allergies or adverse reactions: No Arrival Time: 15:30 Had a fall or experienced change in No Accompanied By: daughter activities of daily living that may affect Transfer Assistance: None risk of falls: Patient Identification Verified: Yes Hospitalized since last visit: No Secondary Verification Process Completed: Yes Has Dressing in Place as Prescribed: Yes Patient Requires Transmission-Based No Pain Present Now: No Precautions: Patient Has Alerts: Yes Patient Alerts: Patient on Blood Thinner Aggrenox NOT Diabetic 03/19/22 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 F) Treating RN: Angelina Pih Primary Care Florance Paolillo: Merita Norton Other Clinician: Referring Amori Cooperman: Merita Norton Treating Takeia Ciaravino/Extender: Rowan Blase in Treatment: 9 Clinic Level of Care Assessment Items TOOL 4 Quantity Score []  - Use when only an EandM is performed on FOLLOW-UP visit  0 ASSESSMENTS - Nursing Assessment / Reassessment X - Reassessment of Co-morbidities (includes updates in patient status) 1 10 X- 1 5 Reassessment of Adherence to Treatment Plan ASSESSMENTS - Wound and Skin Assessment / Reassessment X - Simple Wound Assessment / Reassessment - one wound 1 5 []  - 0 Complex Wound Assessment / Reassessment - multiple wounds []  - 0 Dermatologic / Skin Assessment (not related to wound area) ASSESSMENTS - Focused Assessment []  - Circumferential Edema Measurements - multi extremities 0 []  - 0 Nutritional Assessment / Counseling / Intervention []  - 0 Lower Extremity Assessment (monofilament, tuning fork, pulses) []  - 0 Peripheral Arterial Disease Assessment (using hand held doppler) ASSESSMENTS - Ostomy and/or Continence Assessment and Care []  - Incontinence Assessment and Management 0 []  - 0 Ostomy Care Assessment and Management (repouching, etc.) PROCESS - Coordination of Care X - Simple Patient / Family Education for ongoing care 1 15 []  - 0 Complex (extensive) Patient / Family Education for ongoing care []  - 0 Staff obtains , Records, Test Results / Process Orders []  - 0 Staff telephones HHA, Nursing Homes / Clarify orders / etc []  - 0 Routine Transfer to another Facility (non-emergent condition) []  - 0 Routine Hospital Admission (non-emergent condition) []  - 0 New Admissions / / Ordering NPWT, Apligraf, etc. []  - 0 Emergency Hospital Admission (emergent condition) X- 1 10 Simple Discharge Coordination []  - 0 Complex (extensive) Discharge Coordination PROCESS - Special Needs []  - Pediatric / Minor Patient Management 0 []  - 0 Isolation Patient Management []  - 0 Hearing / Language / Visual special needs []  - 0 Assessment of Community assistance (transportation, D/C planning, etc.) []  - 0 Additional assistance / Altered mentation []  - 0 Support Surface(s) Assessment (bed, cushion, seat,  etc.) INTERVENTIONS - Wound Cleansing / Measurement Leggio, Sya ( ) X- 1 5 Simple Wound Cleansing - one wound []  - 0 Complex Wound Cleansing - multiple wounds X- 1 5 Wound Imaging (photographs - any number of wounds) []  -  0 Wound Tracing (instead of photographs) X- 1 5 Simple Wound Measurement - one wound []  - 0 Complex Wound Measurement - multiple wounds INTERVENTIONS - Wound Dressings []  - Small Wound Dressing one or multiple wounds 0 []  - 0 Medium Wound Dressing one or multiple wounds []  - 0 Large Wound Dressing one or multiple wounds []  - 0 Application of Medications - topical []  - 0 Application of Medications - injection INTERVENTIONS - Miscellaneous []  - External ear exam 0 []  - 0 Specimen Collection (cultures, biopsies, blood, body fluids, etc.) []  - 0 Specimen(s) / Culture(s) sent or taken to Lab for analysis []  - 0 Patient Transfer (multiple staff / / Similar devices) []  - 0 Simple Staple / Suture removal (25 or less) []  - 0 Complex Staple / Suture removal (26 or more) []  - 0 Hypo / Hyperglycemic Management (close monitor of Blood Glucose) []  - 0 Ankle / Brachial Index (ABI) - do not check if billed separately X- 1 5 Vital Signs Has the patient been seen at the hospital within the last three years: Yes Total Score: 65 Level Of Care: New/Established - Level 2 Electronic Signature(s) Signed: 05/27/2022 4:09:46 PM By: Entered By: on 05/27/2022 15:59:25 ( ) -------------------------------------------------------------------------------- Encounter Discharge Information Details Patient Name: Date of Service: 05/27/2022 3:15 PM Medical Record Number: Patient Account Number: Nurse, adult Date of Birth/Sex: 1925-02-09 (88 y.o. F) Treating RN: Primary Care [name]: Other Clinician: Referring Mirha Brucato: 05/29/2022 Treating  Jozlyn Schatz/Extender: Angelina Pih in Treatment: 9 Encounter Discharge Information Items Discharge Condition: Stable Ambulatory Status: Ambulatory Discharge Destination: Home Transportation: Private Auto Accompanied By: daughter Schedule Follow-up Appointment: No Clinical Summary of Care: Electronic Signature(s) Signed: 05/27/2022 4:06:10 PM By: 05/29/2022 Entered By: [name] on 05/27/2022 16:06:10 [name] (05/29/2022) -------------------------------------------------------------------------------- Lower Extremity Assessment Details Patient Name: [id number] Date of Service: 05/27/2022 3:15 PM Medical Record Number: 09/16/1925 Patient Account Number: [id number] Date of Birth/Sex: 1924-12-21 (88 y.o. F) Treating RN: Merita Norton Primary Care Kizzie Cotten: Rowan Blase Other Clinician: Referring Neida Ellegood: 05/29/2022 Treating Mally Gavina/Extender: Angelina Pih Weeks in Treatment: 9 Edema Assessment Assessed: [Left: No] [Right: No] Edema: [Left: N] [Right: o] Vascular Assessment Pulses: Dorsalis Pedis Palpable: [Left:Yes] Electronic Signature(s) Signed: 05/27/2022 4:09:46 PM By: 05/29/2022 Entered By: [name] on 05/27/2022 15:49:25 [name] (05/29/2022) -------------------------------------------------------------------------------- Multi Wound Chart Details Patient Name: [id number] Date of Service: 05/27/2022 3:15 PM Medical Record Number: 09/16/1925 Patient Account Number: [id number] Date of Birth/Sex: Dec 09, 1925 (88 y.o. F) Treating RN: Merita Norton Primary Care Tristen Pennino: Allen Derry Other Clinician: Referring Jahnasia Tatum: 05/29/2022 Treating Ineta Sinning/Extender: Angelina Pih in Treatment: 9 Vital Signs Height(in): 59 Pulse(bpm): 87 Weight(lbs): 107 Blood Pressure(mmHg): 105/64 Body Mass Index(BMI): 21.6 Temperature(F): 97.5 Respiratory Rate(breaths/min): 18 Photos: [N/A:N/A] Wound Location: Left, Lateral Lower Leg N/A  N/A Wounding Event: Gradually Appeared N/A N/A Primary Etiology: Venous Leg Ulcer N/A N/A Comorbid History: Congestive Heart Failure, N/A N/A Hypertension Date Acquired: 05/11/2022 N/A N/A Weeks of Treatment: 2 N/A N/A Wound Status: Open N/A N/A Wound Recurrence: No N/A N/A Measurements L x W x D (cm) 0.1x0.1x0.1 N/A N/A Area (cm) : 0.008 N/A N/A Volume (cm) : 0.001 N/A N/A % Reduction in Area: 97.60% N/A N/A % Reduction in Volume: 97.00% N/A N/A Classification: Full Thickness Without Exposed N/A N/A Support Structures Exudate Amount: None Present N/A N/A Granulation Amount: None Present (0%) N/A N/A Necrotic Amount: None Present (0%) N/A N/A Exposed  Structures: Fascia: No N/A N/A Fat Layer (Subcutaneous Tissue): No Tendon: No Muscle: No Joint: No Bone: No Epithelialization: Large (67-100%) N/A N/A Treatment Notes Electronic Signature(s) Signed: 05/27/2022 4:09:46 PM By: Angelina Pih Entered By: Angelina Pih on 05/27/2022 15:49:41 [name] ([id number]) -------------------------------------------------------------------------------- Multi-Disciplinary Care Plan Details Patient Name: [name] Date of Service: 05/27/2022 3:15 PM Medical Record Number: [id number] Patient Account Number: [id number] Date of Birth/Sex: 04-21-1925 (88 y.o. F) Treating RN: Angelina Pih Primary Care Higinio Grow: Merita Norton Other Clinician: Referring Londen Bok: Merita Norton Treating Tyshauna Finkbiner/Extender: Allen Derry Weeks in Treatment: 9 Active Inactive Electronic Signature(s) Signed: 05/27/2022 4:05:32 PM By: Angelina Pih Entered By: Angelina Pih on 05/27/2022 16:05:31 [name] ([id number]) -------------------------------------------------------------------------------- Pain Assessment Details Patient Name: [name] Date of Service: 05/27/2022 3:15 PM Medical Record Number: [id number] Patient Account Number: [id number] Date of Birth/Sex: 1925/04/20 (88 y.o. F) Treating  RN: Angelina Pih Primary Care Hazelene Doten: Merita Norton Other Clinician: Referring Anetha Slagel: Merita Norton Treating Diondre Pulis/Extender: Rowan Blase in Treatment: 9 Active Problems Location of Pain Severity and Description of Pain Patient Has Paino No Site Locations Rate the pain. Current Pain Level: 0 Pain Management and Medication Current Pain Management: Electronic Signature(s) Signed: 05/27/2022 4:09:46 PM By: Angelina Pih Entered By: Angelina Pih on 05/27/2022 15:32:01 [name] ([id number]) -------------------------------------------------------------------------------- Patient/Caregiver Education Details Patient Name: [name] Date of Service: 05/27/2022 3:15 PM Medical Record Number: [id number] Patient Account Number: [id number] Date of Birth/Gender: 1925/08/18 (88 y.o. F) Treating RN: Angelina Pih Primary Care Physician: Merita Norton Other Clinician: Referring Physician: Merita Norton Treating Physician/Extender: Rowan Blase in Treatment: 9 Education Assessment Education Provided To: Patient Education Topics Provided Wound/Skin Impairment: Handouts: Caring for Your Ulcer Methods: Explain/Verbal Responses: State content correctly Electronic Signature(s) Signed: 05/27/2022 4:09:46 PM By: Angelina Pih Entered By: Angelina Pih on 05/27/2022 16:05:15 [name] ([id number]) -------------------------------------------------------------------------------- Wound Assessment Details Patient Name: [name] Date of Service: 05/27/2022 3:15 PM Medical Record Number: [id number] Patient Account Number: [id number] Date of Birth/Sex: 06-02-1925 (88 y.o. F) Treating RN: Angelina Pih Primary Care Karyssa Amaral: Merita Norton Other Clinician: Referring Markeesha Char: Merita Norton Treating Ayvin Lipinski/Extender: Rowan Blase in Treatment: 9 Wound Status Wound Number: 2 Primary Etiology: Venous Leg Ulcer Wound Location: Left, Lateral Lower Leg Wound  Status: Healed - Epithelialized Wounding Event: Gradually Appeared Comorbid History: Congestive Heart Failure, Hypertension Date Acquired: 05/11/2022 Weeks Of Treatment: 2 Clustered Wound: No Photos Wound Measurements Length: (cm) 0 Width: (cm) 0 Depth: (cm) 0 Area: (cm) 0 Volume: (cm) 0 % Reduction in Area: 100% % Reduction in Volume: 100% Epithelialization: Large (67-100%) Tunneling: No Undermining: No Wound Description Classification: Full Thickness Without Exposed Support Structures Exudate Amount: None Present Foul Odor After Cleansing: No Slough/Fibrino No Wound Bed Granulation Amount: None Present (0%) Exposed Structure Necrotic Amount: None Present (0%) Fascia Exposed: No Fat Layer (Subcutaneous Tissue) Exposed: No Tendon Exposed: No Muscle Exposed: No Joint Exposed: No Bone Exposed: No Treatment Notes Wound #2 (Lower Leg) Wound Laterality: Left, Lateral Cleanser Peri-Wound Care Topical Primary Dressing ZEMIRA, ZEHRING ([id number]) Secondary Dressing Secured With Compression Wrap Compression Stockings Add-Ons Electronic Signature(s) Signed: 05/27/2022 4:09:46 PM By: Angelina Pih Entered By: Angelina Pih on 05/27/2022 15:55:39 [name] ([id number]) -------------------------------------------------------------------------------- Vitals Details Patient Name: [name] Date of Service: 05/27/2022 3:15 PM Medical Record Number: [id number] Patient Account Number: [id number] Date of Birth/Sex: 02-24-1925 (88 y.o. F) Treating RN: Angelina Pih Primary Care Augusten Lipkin: Merita Norton Other Clinician: Referring Conchita Truxillo: Merita Norton Treating Maxxwell Edgett/Extender: Allen Derry Weeks in Treatment: 9 Vital Signs Time Taken: 15:30  Temperature (F): 97.5 Height (in): 59 Pulse (bpm): 87 Weight (lbs): 107 Respiratory Rate (breaths/min): 18 Body Mass Index (BMI): 21.6 Blood Pressure (mmHg): 105/64 Reference Range: 80 - 120 mg / dl Electronic  Signature(s) Signed: 05/27/2022 4:09:46 PM By: Angelina Pih Entered By: Angelina Pih on 05/27/2022 15:31:41

## 2022-05-27 NOTE — Telephone Encounter (Signed)
  Chief Complaint: cough Symptoms: productive cough, brownish yellow mucus, feeling fatigued Frequency: 1+ week  Pertinent Negatives: denies fever Disposition: [] ED /[] Urgent Care (no appt availability in office) / [x] Appointment(In office/virtual)/ []  [facility]/ [] Home Care/ [] Refused Recommended Disposition /[] [facility]/ []  Follow-up with PCP Additional Notes: pt's daughter says that pt feels fine but slept til almost noon Saturday and normally is early riser. [facility] nurse said she heard fluid in lungs 2 Fridays ago and then Tuesday said pt lungs sounded clear so advised daughter to schedule an appt. Was wanting something for today after wound care appt but advised no appts available. Scheduled for tomorrow at 1540 with Dr. .   Reason for Disposition  [1] Continuous (nonstop) coughing interferes with work or school AND [2] no improvement using cough treatment per Care Advice  Answer Assessment - Initial Assessment Questions 1. ONSET: "When did the cough begin?"      Since last Tuesday  3. SPUTUM: "Describe the color of your sputum" (none, dry cough; clear, white, yellow, green)     Brownish yellow  5. DIFFICULTY BREATHING: "Are you having difficulty breathing?" If Yes, ask: "How bad is it?" (e.g., mild, moderate, severe)    - MILD: No SOB at rest, mild SOB with walking, speaks normally in sentences, can lie down, no retractions, pulse < 100.    - MODERATE: SOB at rest, SOB with minimal exertion and prefers to sit, cannot lie down flat, speaks in phrases, mild retractions, audible wheezing, pulse 100-120.    - SEVERE: Very SOB at rest, speaks in single words, struggling to breathe, sitting hunched forward, retractions, pulse > 120      Mild to moderate  8. LUNG HISTORY: "Do you have any history of lung disease?"  (e.g., pulmonary embolus, asthma, emphysema)     No, CHF 10. OTHER SYMPTOMS: "Do you have any other symptoms?" (e.g., runny nose, wheezing, chest pain)        Feeling fatigued  Protocols used: Cough - Acute Productive-A-AH

## 2022-05-28 ENCOUNTER — Encounter: Payer: Self-pay | Admitting: Family Medicine

## 2022-05-28 ENCOUNTER — Ambulatory Visit: Payer: Medicare PPO | Admitting: Family Medicine

## 2022-05-28 VITALS — BP 136/67 | HR 75 | Temp 97.9°F | Resp 14 | Wt 109.0 lb

## 2022-05-28 DIAGNOSIS — R059 Cough, unspecified: Secondary | ICD-10-CM

## 2022-05-28 NOTE — Telephone Encounter (Signed)
LMOVM for [facility] to release the patient in Carelink. I will check Carelink tomorrow to see if the patient has been released.

## 2022-05-28 NOTE — Progress Notes (Unsigned)
I,Jana Robinson,acting as a scribe for Mila Merry, MD.,have documented all relevant documentation on the behalf of Mila Merry, MD,as directed by  Mila Merry, MD while in the presence of Mila Merry, MD.'e Established patient visit   Patient: [name]   DOB: Jun 09, 1925   88 y.o. Female  MRN: [id number] Visit Date: 05/28/2022  Today's healthcare provider: Mila Merry, MD   Chief Complaint  Patient presents with   Cough   Subjective    HPI   Patient is a 88 year old female who presents for evaluation of productive cough with brownish, yellow mucus. Has episodes of coughing that's hard to stop.  Daughter states she usually rises very early but is sleeping later. Onset 1 wk ago. Patient has taken nothing for it.  Denies chest pain, shortness of breath.   Medications Outpatient Medications Prior to Visit  Medication Sig   amLODipine (NORVASC) 5 MG tablet TAKE 1 TABLET BY MOUTH EVERY DAY   atorvastatin (LIPITOR) 10 MG tablet Take 10 mg by mouth daily.   calcium carbonate (OS-CAL - DOSED IN MG OF ELEMENTAL CALCIUM) 1250 (500 Ca) MG tablet Take 1 tablet by mouth 2 (two) times daily with a meal.   dipyridamole-aspirin (AGGRENOX) 200-25 MG 12hr capsule Take 1 capsule by mouth 2 (two) times daily.   JARDIANCE 10 MG TABS tablet TAKE 1 TABLET BY MOUTH DAILY BEFORE BREAKFAST.   loratadine (CLARITIN) 10 MG tablet Take 10 mg by mouth daily.   losartan (COZAAR) 50 MG tablet Take 50 mg by mouth 2 (two) times daily.   metoprolol (TOPROL-XL) 200 MG 24 hr tablet Take 200 mg by mouth daily.   Multiple Vitamins-Minerals (PRESERVISION AREDS 2 PO) Take 1 tablet by mouth in the morning and at bedtime.   multivitamin-iron-minerals-folic acid (CENTRUM) chewable tablet Chew 1 tablet by mouth daily.   Omega-3 Fatty Acids (FISH OIL) 1000 MG CAPS Take 1 capsule by mouth daily.   potassium chloride (KLOR-CON) 10 MEQ tablet Take 10 mEq by mouth as needed. Only if she takes an additional dose of  lasix   torsemide (DEMADEX) 20 MG tablet Take 1 tablet (20 mg total) by mouth daily. And additional 20mg  if needed   No facility-administered medications prior to visit.    Review of Systems  {Labs  Heme  Chem  Endocrine  Serology  Results Review (optional):23779}   Objective    BP 136/67 (BP Location: Right Arm, Patient Position: Sitting, Cuff Size: Normal)   Pulse 75   Temp 97.9 F (36.6 C) (Oral)   Resp 14   Wt 109 lb (49.4 kg)   LMP  (LMP Unknown)   SpO2 99%   BMI 22.02 kg/m  {Show previous vital signs (optional):23777}  Physical Exam   General: Appearance:    Well developed, well nourished female in no acute distress  Eyes:    PERRL, conjunctiva/corneas clear, EOM's intact       Lungs:     Clear to auscultation bilaterally, respirations unlabored  Heart:    Normal heart rate. Regular rhythm. No murmurs, rubs, or gallops.    MS:   All extremities are intact.    Neurologic:   Awake, alert, oriented x 3. No apparent focal neurological defect.         Assessment & Plan     ***  No follow-ups on file.      {provider attestation***:1}   , MD  [facility] [phone] (phone) [phone] (fax)  [facility]

## 2022-05-29 NOTE — Telephone Encounter (Signed)
Pt is release to our Carelink. I put her on a 91 day schedule. I called patient to get a transmission from her monitor to make sure her monitor is working. The monitor has not sent a transmission since 01/26/2022.

## 2022-05-30 ENCOUNTER — Telehealth: Payer: Self-pay

## 2022-05-30 NOTE — Telephone Encounter (Signed)
I spoke with the patient daughter to let her know that the patient monitor has not transmitted since February. She states the patient has a monitor and she will send a transmission today.

## 2022-06-03 ENCOUNTER — Telehealth: Payer: Self-pay | Admitting: Family Medicine

## 2022-06-10 ENCOUNTER — Ambulatory Visit: Payer: Medicare PPO | Attending: Family | Admitting: Family

## 2022-06-10 ENCOUNTER — Encounter: Payer: Self-pay | Admitting: Family

## 2022-06-10 VITALS — BP 122/74 | HR 82 | Resp 16 | Ht 59.0 in | Wt 107.4 lb

## 2022-06-10 DIAGNOSIS — R5383 Other fatigue: Secondary | ICD-10-CM | POA: Diagnosis not present

## 2022-06-10 DIAGNOSIS — Z7984 Long term (current) use of oral hypoglycemic drugs: Secondary | ICD-10-CM | POA: Diagnosis not present

## 2022-06-10 DIAGNOSIS — I110 Hypertensive heart disease with heart failure: Secondary | ICD-10-CM | POA: Diagnosis present

## 2022-06-10 DIAGNOSIS — I890 Lymphedema, not elsewhere classified: Secondary | ICD-10-CM | POA: Insufficient documentation

## 2022-06-10 DIAGNOSIS — I5032 Chronic diastolic (congestive) heart failure: Secondary | ICD-10-CM | POA: Diagnosis present

## 2022-06-10 DIAGNOSIS — Z950 Presence of cardiac pacemaker: Secondary | ICD-10-CM | POA: Insufficient documentation

## 2022-06-10 DIAGNOSIS — I4821 Permanent atrial fibrillation: Secondary | ICD-10-CM

## 2022-06-10 DIAGNOSIS — I4891 Unspecified atrial fibrillation: Secondary | ICD-10-CM | POA: Insufficient documentation

## 2022-06-10 NOTE — Patient Instructions (Addendum)
Continue weighing daily and call for an overnight weight gain of 3 pounds or more or a weekly weight gain of more than 5 pounds. ? ? ?If you have voicemail, please make sure your mailbox is cleaned out so that we may leave a message and please make sure to listen to any voicemails.  ? ? ?Call us in the future if you need us for anything ?

## 2022-06-10 NOTE — Progress Notes (Unsigned)
Patient ID: [name], female    DOB: 1925/01/17, 88 y.o.   MRN: [id number]  HPI  [name] is a 88 year old female with a history of afib, pacemaker, and unspecified heart failure.   Echo report from 02/26/22 reviewed and showed an EF of 60-65% along with mild LVH, moderately elevated PA pressure, moderate MR, moderate/severe TR, mild/moderate AS and severe LAE.   Was in the ED on 02/03/22 for SOB and cough, as well as a leg laceration that occurred while in the ED parking lot. CXR showed prominent central pulmonary vessels and increased interstitial markings, suggesting CHF. BNP 648. She was given lasix 40 mg PO to take, and was felt to be stable to dc back home.   She presents today for a follow-up visit with a chief complaint of minimal fatigue upon moderate exertion. Describes this as chronic in nature and pedal edema along with this. She denies any difficulty sleeping, dizziness, cough, shortness of breath, chest pain, palpitations, abdominal distention or weight gain.   Past Medical History:  Diagnosis Date   Arrhythmia    atrial fibrillation   Congestive heart failure (CHF) (HCC)    Hypertension    Pacemaker 2015   Past Surgical History:  Procedure Laterality Date   ABDOMINAL HYSTERECTOMY     EYE SURGERY     MOHS SURGERY  2023   PACEMAKER PLACEMENT N/A 2007   2017   Family History  Problem Relation Age of Onset   Heart failure Mother    Heart attack Father    Heart attack Daughter    Social History   Tobacco Use   Smoking status: Never   Smokeless tobacco: Never  Substance Use Topics   Alcohol use: Not Currently   Allergies  Allergen Reactions   Moxifloxacin Itching and Swelling    Swelling and itching of the eyes.   Tobramycin Itching and Swelling    Swelling and itching of the eyelids.   Codeine     Other reaction(s): Hallucination   Prior to Admission medications   Medication Sig Start Date End Date Taking? Authorizing Provider  amLODipine (NORVASC)  5 MG tablet TAKE 1 TABLET BY MOUTH EVERY DAY 05/17/22  Yes [name], FNP  atorvastatin (LIPITOR) 10 MG tablet Take 10 mg by mouth daily. 01/11/22  Yes [provider]  calcium carbonate (OS-CAL - DOSED IN MG OF ELEMENTAL CALCIUM) 1250 (500 Ca) MG tablet Take 1 tablet by mouth 2 (two) times daily with a meal.   Yes [provider]  dipyridamole-aspirin (AGGRENOX) 200-25 MG 12hr capsule Take 1 capsule by mouth 2 (two) times daily. 12/13/21  Yes [provider]  JARDIANCE 10 MG TABS tablet TAKE 1 TABLET BY MOUTH DAILY BEFORE BREAKFAST. 05/26/22  Yes Marlane Hirschmann, Inetta Fermo A, FNP  loratadine (CLARITIN) 10 MG tablet Take 10 mg by mouth daily.   Yes [provider]  losartan (COZAAR) 50 MG tablet Take 50 mg by mouth 2 (two) times daily. 01/11/22  Yes [provider]  metoprolol (TOPROL-XL) 200 MG 24 hr tablet Take 200 mg by mouth daily.   Yes [provider]  Multiple Vitamins-Minerals (PRESERVISION AREDS 2 PO) Take 1 tablet by mouth in the morning and at bedtime.   Yes [provider]  multivitamin-iron-minerals-folic acid (CENTRUM) chewable tablet Chew 1 tablet by mouth daily.   Yes [provider]  Omega-3 Fatty Acids (FISH OIL) 1000 MG CAPS Take 1 capsule by mouth daily.   Yes [provider]  torsemide (DEMADEX) 20 MG tablet Take 1 tablet (20 mg total) by mouth daily. And additional 20mg  if needed 04/09/22  Yes 06/09/22 A, FNP  potassium chloride (KLOR-CON) 10 MEQ tablet Take 10 mEq by mouth as needed. Only if she takes an additional dose of lasix Patient not taking: Reported on 06/10/2022    [provider]    Review of Systems  Constitutional:  Positive for fatigue. Negative for appetite change.  HENT:  Negative for congestion, rhinorrhea and sore throat.   Eyes: Negative.   Respiratory:  Negative for cough and shortness of breath.   Cardiovascular:  Positive for leg swelling. Negative for chest pain and  palpitations.  Gastrointestinal:  Negative for abdominal distention and abdominal pain.  Endocrine: Negative.   Genitourinary: Negative.  Negative for dysuria and urgency.  Musculoskeletal:  Negative for back pain and neck pain.  Skin: Negative.   Allergic/Immunologic: Negative.   Neurological:  Negative for dizziness and light-headedness.  Hematological:  Negative for adenopathy. Does not bruise/bleed easily.  Psychiatric/Behavioral:  Negative for dysphoric mood and sleep disturbance (sleeping on 2 pillows). The patient is not nervous/anxious.    Vitals:   06/10/22 1440  BP: 122/74  Pulse: 82  Resp: 16  SpO2: 98%  Weight: 107 lb 6 oz (48.7 kg)  Height: 4\' 11"  (1.499 m)   Wt Readings from Last 3 Encounters:  06/10/22 107 lb 6 oz (48.7 kg)  05/28/22 109 lb (49.4 kg)  05/22/22 108 lb 3.2 oz (49.1 kg)   Lab Results  Component Value Date   CREATININE 0.97 04/08/2022   CREATININE 0.88 03/07/2022   CREATININE 0.86 02/27/2022   Physical Exam Vitals and nursing note reviewed. Exam conducted with a chaperone present (daughter).  Constitutional:      Appearance: Normal appearance.  HENT:     Head: Normocephalic and atraumatic.  Cardiovascular:     Rate and Rhythm: Normal rate and regular rhythm.  Pulmonary:     Effort: Pulmonary effort is normal. No respiratory distress.     Breath sounds: No wheezing or rales.  Abdominal:     General: There is no distension.     Palpations: Abdomen is soft.     Tenderness: There is no abdominal tenderness.  Musculoskeletal:        General: No tenderness.     Cervical back: Normal range of motion and neck supple.     Right lower leg: Edema (trace pitting) present.     Left lower leg: Edema (trace pitting) present.  Skin:    General: Skin is warm and dry.  Neurological:     General: No focal deficit present.     Mental Status: She is alert and oriented to person, place, and time.  Psychiatric:        Mood and Affect: Mood normal.         Behavior: Behavior normal.        Thought Content: Thought content normal.     Assessment & Plan:  Chronic heart failure with preserved ejection fraction with structural changes (LAE)- - NYHA II - euvolemic today - weighing daily; reminded to call for an overnight weight gain of > 2 pounds or a weekly weight gain of > 5 pounds - weight up 3 pounds from last visit here 2 months ago - goes to an adult day care each day, reports that she eats low sodium diet there - trying to keep daily fluid intake to 60-64 ounces daily - on  GDMT of jardiance - BNP 648 on 02/03/22  Atrial fibrillation - RRR today, has pacemaker - saw cardiology, Dr. Azucena Cecil 03/22/22 - saw EP Lalla Brothers) 05/22/22 - BMP 04/08/22 reviewed and showed sodium 141, potassium 3.4, creatinine 0.97 & GFR 54  3: Lymphedema- - stage 2 - wears compression socks daily; edema much improved - is quite active and walked into the office - goes to an adult daycare daily - saw wound center Larina Bras) 05/27/22 - saw PCP (fisher) 05/28/22   Medication list reviewed.  Due to HF stability, will not make a return appointment for patient at this time. Advised patient and her daughter that they could call back at anytime to make another appointment and they were comfortable with this plan.

## 2022-06-11 ENCOUNTER — Encounter: Payer: Self-pay | Admitting: Family

## 2022-06-12 NOTE — Telephone Encounter (Signed)
The patient daughter called to let me know the monitor was not working. I ordered the patient a new monitor. She should receive it in 7-10 business days.

## 2022-06-16 ENCOUNTER — Other Ambulatory Visit: Payer: Self-pay | Admitting: Family Medicine

## 2022-07-04 ENCOUNTER — Ambulatory Visit (INDEPENDENT_AMBULATORY_CARE_PROVIDER_SITE_OTHER): Payer: Medicare PPO

## 2022-07-04 DIAGNOSIS — Z0000 Encounter for general adult medical examination without abnormal findings: Secondary | ICD-10-CM | POA: Diagnosis not present

## 2022-07-04 NOTE — Patient Instructions (Signed)
////[name] , Thank you for taking time to come for your Medicare Wellness Visit. I appreciate your ongoing commitment to your health goals. Please review the following plan we discussed and let me know if I can assist you in the future.   Screening recommendations/referrals: Colonoscopy: aged out Mammogram: aged out Bone Density: aged out Recommended yearly ophthalmology/optometry visit for glaucoma screening and checkup Recommended yearly dental visit for hygiene and checkup  Vaccinations: Influenza vaccine: 08/16/21 Pneumococcal vaccine: n/d Tdap vaccine: n/d Shingles vaccine: n/d   Covid-19:12/28/19, 01/18/20, 09/22/20, 08/16/21  Advanced directives: yes, copy needed  Conditions/risks identified: no  Next appointment: Follow up in one year for your annual wellness visit    Preventive Care 65 Years and Older, Female Preventive care refers to lifestyle choices and visits with your health care provider that can promote health and wellness. What does preventive care include? A yearly physical exam. This is also called an annual well check. Dental exams once or twice a year. Routine eye exams. Ask your health care provider how often you should have your eyes checked. Personal lifestyle choices, including: Daily care of your teeth and gums. Regular physical activity. Eating a healthy diet. Avoiding tobacco and drug use. Limiting alcohol use. Practicing safe sex. Taking low-dose aspirin every day. Taking vitamin and mineral supplements as recommended by your health care provider. What happens during an annual well check? The services and screenings done by your health care provider during your annual well check will depend on your age, overall health, lifestyle risk factors, and family history of disease. Counseling  Your health care provider may ask you questions about your: Alcohol use. Tobacco use. Drug use. Emotional well-being. Home and relationship well-being. Sexual  activity. Eating habits. History of falls. Memory and ability to understand (cognition). Work and work Astronomer. Reproductive health. Screening  You may have the following tests or measurements: Height, weight, and BMI. Blood pressure. Lipid and cholesterol levels. These may be checked every 5 years, or more frequently if you are over 6 years old. Skin check. Lung cancer screening. You may have this screening every year starting at age 88 if you have a 30-pack-year history of smoking and currently smoke or have quit within the past 15 years. Fecal occult blood test (FOBT) of the stool. You may have this test every year starting at age 51. Flexible sigmoidoscopy or colonoscopy. You may have a sigmoidoscopy every 5 years or a colonoscopy every 10 years starting at age 25. Hepatitis C blood test. Hepatitis B blood test. Sexually transmitted disease (STD) testing. Diabetes screening. This is done by checking your blood sugar (glucose) after you have not eaten for a while (fasting). You may have this done every 1-3 years. Bone density scan. This is done to screen for osteoporosis. You may have this done starting at age 74. Mammogram. This may be done every 1-2 years. Talk to your health care provider about how often you should have regular mammograms. Talk with your health care provider about your test results, treatment options, and if necessary, the need for more tests. Vaccines  Your health care provider may recommend certain vaccines, such as: Influenza vaccine. This is recommended every year. Tetanus, diphtheria, and acellular pertussis (Tdap, Td) vaccine. You may need a Td booster every 10 years. Zoster vaccine. You may need this after age 61. Pneumococcal 13-valent conjugate (PCV13) vaccine. One dose is recommended after age 83. Pneumococcal polysaccharide (PPSV23) vaccine. One dose is recommended after age 44. Talk to your health  care provider about which screenings and vaccines  you need and how often you need them. This information is not intended to replace advice given to you by your health care provider. Make sure you discuss any questions you have with your health care provider. Document Released: 12/22/2015 Document Revised: 08/14/2016 Document Reviewed: 09/26/2015 Elsevier Interactive Patient Education  2017 [employer] Prevention in the Home Falls can cause injuries. They can happen to people of all ages. There are many things you can do to make your home safe and to help prevent falls. What can I do on the outside of my home? Regularly fix the edges of walkways and driveways and fix any cracks. Remove anything that might make you trip as you walk through a door, such as a raised step or threshold. Trim any bushes or trees on the path to your home. Use bright outdoor lighting. Clear any walking paths of anything that might make someone trip, such as rocks or tools. Regularly check to see if handrails are loose or broken. Make sure that both sides of any steps have handrails. Any raised decks and porches should have guardrails on the edges. Have any leaves, snow, or ice cleared regularly. Use sand or salt on walking paths during winter. Clean up any spills in your garage right away. This includes oil or grease spills. What can I do in the bathroom? Use night lights. Install grab bars by the toilet and in the tub and shower. Do not use towel bars as grab bars. Use non-skid mats or decals in the tub or shower. If you need to sit down in the shower, use a plastic, non-slip stool. Keep the floor dry. Clean up any water that spills on the floor as soon as it happens. Remove soap buildup in the tub or shower regularly. Attach bath mats securely with double-sided non-slip rug tape. Do not have throw rugs and other things on the floor that can make you trip. What can I do in the bedroom? Use night lights. Make sure that you have a light by your bed that  is easy to reach. Do not use any sheets or blankets that are too big for your bed. They should not hang down onto the floor. Have a firm chair that has side arms. You can use this for support while you get dressed. Do not have throw rugs and other things on the floor that can make you trip. What can I do in the kitchen? Clean up any spills right away. Avoid walking on wet floors. Keep items that you use a lot in easy-to-reach places. If you need to reach something above you, use a strong step stool that has a grab bar. Keep electrical cords out of the way. Do not use floor polish or wax that makes floors slippery. If you must use wax, use non-skid floor wax. Do not have throw rugs and other things on the floor that can make you trip. What can I do with my stairs? Do not leave any items on the stairs. Make sure that there are handrails on both sides of the stairs and use them. Fix handrails that are broken or loose. Make sure that handrails are as long as the stairways. Check any carpeting to make sure that it is firmly attached to the stairs. Fix any carpet that is loose or worn. Avoid having throw rugs at the top or bottom of the stairs. If you do have throw rugs, attach them to  the floor with carpet tape. Make sure that you have a light switch at the top of the stairs and the bottom of the stairs. If you do not have them, ask someone to add them for you. What else can I do to help prevent falls? Wear shoes that: Do not have high heels. Have rubber bottoms. Are comfortable and fit you well. Are closed at the toe. Do not wear sandals. If you use a stepladder: Make sure that it is fully opened. Do not climb a closed stepladder. Make sure that both sides of the stepladder are locked into place. Ask someone to hold it for you, if possible. Clearly mark and make sure that you can see: Any grab bars or handrails. First and last steps. Where the edge of each step is. Use tools that help you  move around (mobility aids) if they are needed. These include: Canes. Walkers. Scooters. Crutches. Turn on the lights when you go into a dark area. Replace any light bulbs as soon as they burn out. Set up your furniture so you have a clear path. Avoid moving your furniture around. If any of your floors are uneven, fix them. If there are any pets around you, be aware of where they are. Review your medicines with your doctor. Some medicines can make you feel dizzy. This can increase your chance of falling. Ask your doctor what other things that you can do to help prevent falls. This information is not intended to replace advice given to you by your health care provider. Make sure you discuss any questions you have with your health care provider. Document Released: 09/21/2009 Document Revised: 05/02/2016 Document Reviewed: 12/30/2014 Elsevier Interactive Patient Education  2017 [employer].

## 2022-07-04 NOTE — Progress Notes (Signed)
Virtual Visit via Telephone Note  I connected with  [name] on 07/04/22 at  3:30 PM EDT by telephone and verified that I am speaking with the correct person using two identifiers.  Location: Patient: home Provider: [facility] Persons participating in the virtual visit: patient/Nurse Health Advisor   I discussed the limitations, risks, security and privacy concerns of performing an evaluation and management service by telephone and the availability of in person appointments. The patient expressed understanding and agreed to proceed.  Interactive audio and video telecommunications were attempted between this nurse and patient, however failed, due to patient having technical difficulties OR patient did not have access to video capability.  We continued and completed visit with audio only.  Some vital signs may be absent or patient reported.   Hal Hope, LPN  Subjective:   [name] is a 88 y.o. female who presents for Medicare Annual (Subsequent) preventive examination.  Review of Systems           Objective:    There were no vitals filed for this visit. There is no height or weight on file to calculate BMI.      No data to display          Current Medications (verified) Outpatient Encounter Medications as of 07/04/2022  Medication Sig   amLODipine (NORVASC) 5 MG tablet TAKE 1 TABLET BY MOUTH EVERY DAY   atorvastatin (LIPITOR) 10 MG tablet Take 10 mg by mouth daily.   calcium carbonate (OS-CAL - DOSED IN MG OF ELEMENTAL CALCIUM) 1250 (500 Ca) MG tablet Take 1 tablet by mouth 2 (two) times daily with a meal.   dipyridamole-aspirin (AGGRENOX) 200-25 MG 12hr capsule TAKE 1 CAPSULE BY MOUTH TWICE A DAY   JARDIANCE 10 MG TABS tablet TAKE 1 TABLET BY MOUTH DAILY BEFORE BREAKFAST.   loratadine (CLARITIN) 10 MG tablet Take 10 mg by mouth daily.   losartan (COZAAR) 50 MG tablet Take 50 mg by mouth 2 (two) times daily.   metoprolol (TOPROL-XL) 200 MG 24 hr tablet TAKE 1  TABLET BY MOUTH EVERY DAY   Multiple Vitamins-Minerals (PRESERVISION AREDS 2 PO) Take 1 tablet by mouth in the morning and at bedtime.   multivitamin-iron-minerals-folic acid (CENTRUM) chewable tablet Chew 1 tablet by mouth daily.   Omega-3 Fatty Acids (FISH OIL) 1000 MG CAPS Take 1 capsule by mouth daily.   torsemide (DEMADEX) 20 MG tablet Take 1 tablet (20 mg total) by mouth daily. And additional 20mg  if needed   doxycycline (VIBRAMYCIN) 100 MG capsule TAKE 1 CAPSULE BY MOUTH TWICE A DAY FOR 10 DAYS (Patient not taking: Reported on 07/04/2022)   furosemide (LASIX) 40 MG tablet TAKE 1 TABLET BY MOUTH EVERY DAY MAY TAKE EXTRA TABLET AS NEEDED FOR WEIGHT GAIN OR EDEMA (Patient not taking: Reported on 07/04/2022)   potassium chloride (KLOR-CON) 10 MEQ tablet Take 10 mEq by mouth as needed. Only if she takes an additional dose of lasix (Patient not taking: Reported on 06/10/2022)   [DISCONTINUED] amLODipine (NORVASC) 5 MG tablet Take 1 tablet by mouth daily.   [DISCONTINUED] atorvastatin (LIPITOR) 10 MG tablet Take 1 tablet by mouth daily.   [DISCONTINUED] losartan (COZAAR) 50 MG tablet Take 1 tablet by mouth 2 (two) times daily.   No facility-administered encounter medications on file as of 07/04/2022.    Allergies (verified) Moxifloxacin, Tobramycin, and Codeine   History: Past Medical History:  Diagnosis Date   Arrhythmia    atrial fibrillation   Congestive heart failure (CHF) (  [place])    Hypertension    Pacemaker 2015   Past Surgical History:  Procedure Laterality Date   ABDOMINAL HYSTERECTOMY     EYE SURGERY     MOHS SURGERY  2023   PACEMAKER PLACEMENT N/A 2007   2017   Family History  Problem Relation Age of Onset   Heart failure Mother    Heart attack Father    Heart attack Daughter    Social History   Socioeconomic History   Marital status: Widowed    Spouse name: Not on file   Number of children: Not on file   Years of education: Not on file   Highest education level:  Not on file  Occupational History   Not on file  Tobacco Use   Smoking status: Never   Smokeless tobacco: Never  Substance and Sexual Activity   Alcohol use: Not Currently   Drug use: Never   Sexual activity: Not on file  Other Topics Concern   Not on file  Social History Narrative   Not on file   Social Determinants of Health   Financial Resource Strain: Not on file  Food Insecurity: Not on file  Transportation Needs: Not on file  Physical Activity: Insufficiently Active (07/04/2022)   Exercise Vital Sign    Days of Exercise per Week: 2 days    Minutes of Exercise per Session: 20 min  Stress: No Stress Concern Present (07/04/2022)   [place] of Stress : Not at all  Social Connections: Not on file    Tobacco Counseling Counseling given: Not Answered   Clinical Intake:  Pre-visit preparation completed: Yes  Pain : No/denies pain     Nutritional Risks: None Diabetes: No  How often do you need to have someone help you when you read instructions, pamphlets, or other written materials from your doctor or pharmacy?: 1 - Never  Diabetic?no  Interpreter Needed?: No  Information entered by :: Kirke Shaggy, LPN   Activities of Daily Living    06/10/2022    2:40 PM 04/08/2022    3:03 PM  In your present state of health, do you have any difficulty performing the following activities:  Hearing? 1 0  Vision? 1 0  Difficulty concentrating or making decisions? 0 0  Walking or climbing stairs? 0 0  Dressing or bathing? 0 0  Doing errands, shopping? 0 0    Patient Care Team: Gwyneth Sprout, FNP as PCP - General (Family Medicine) Vickie Epley, MD as PCP - Electrophysiology (Cardiology)  Indicate any recent Medical Services you may have received from other than [facility] providers in the past year (date may be approximate).     Assessment:   This is a routine wellness examination for  [name].  Hearing/Vision screen No results found.  Dietary issues and exercise activities discussed:     Goals Addressed             This Visit's Progress    DIET - EAT MORE FRUITS AND VEGETABLES         Depression Screen    07/04/2022    3:28 PM 03/27/2022    2:25 PM 02/05/2022    9:26 AM  PHQ 2/9 Scores  PHQ - 2 Score 0 0 0  PHQ- 9 Score 0 2     Fall Risk    06/10/2022    2:39 PM 04/08/2022    3:02 PM 03/27/2022  2:25 PM 02/13/2022    3:12 PM 02/05/2022    9:27 AM  Fall Risk   Falls in the past year? 0 0 1 1 0  Number falls in past yr: 0 0 1 0 0  Injury with Fall? 0 0 1 0 0  Risk for fall due to :   History of fall(s);Impaired balance/gait;Impaired mobility Impaired balance/gait;Impaired mobility Impaired balance/gait;Impaired mobility;Impaired vision  Follow up Falls evaluation completed Falls evaluation completed Falls evaluation completed;Education provided;Falls prevention discussed Falls evaluation completed Falls evaluation completed;Education provided;Falls prevention discussed    FALL RISK PREVENTION PERTAINING TO THE HOME:  Any stairs in or around the home? No  If so, are there any without handrails? Yes  Home free of loose throw rugs in walkways, pet beds, electrical cords, etc? Yes  Adequate lighting in your home to reduce risk of falls? Yes   ASSISTIVE DEVICES UTILIZED TO PREVENT FALLS:  Life alert? No  Use of a cane, walker or w/c? Yes  Grab bars in the bathroom? Yes  Shower chair or bench in shower? Yes  Elevated toilet seat or a handicapped toilet? No   Cognitive Function:declined      Immunizations There is no immunization history for the selected administration types on file for this patient.  TDAP status: Due, Education has been provided regarding the importance of this vaccine. Advised may receive this vaccine at local pharmacy or Health Dept. Aware to provide a copy of the vaccination record if obtained from local pharmacy or Health Dept.  Verbalized acceptance and understanding.  Flu Vaccine status: Up to date  Pneumococcal vaccine status: Due, Education has been provided regarding the importance of this vaccine. Advised may receive this vaccine at local pharmacy or Health Dept. Aware to provide a copy of the vaccination record if obtained from local pharmacy or Health Dept. Verbalized acceptance and understanding.  Covid-19 vaccine status: Completed vaccines  Qualifies for Shingles Vaccine? Yes   Zostavax completed No   Shingrix Completed?: No.    Education has been provided regarding the importance of this vaccine. Patient has been advised to call insurance company to determine out of pocket expense if they have not yet received this vaccine. Advised may also receive vaccine at local pharmacy or Health Dept. Verbalized acceptance and understanding.  Screening Tests Health Maintenance  Topic Date Due   COVID-19 Vaccine (1) Never done   TETANUS/TDAP  Never done   Zoster Vaccines- Shingrix (1 of 2) Never done   Pneumonia Vaccine 92+ Years old (1 - PCV) Never done   DEXA SCAN  Never done   INFLUENZA VACCINE  07/09/2022   HPV VACCINES  Aged Out    Health Maintenance  Health Maintenance Due  Topic Date Due   COVID-19 Vaccine (1) Never done   TETANUS/TDAP  Never done   Zoster Vaccines- Shingrix (1 of 2) Never done   Pneumonia Vaccine 73+ Years old (1 - PCV) Never done   DEXA SCAN  Never done    Colorectal cancer screening: No longer required.   Mammogram status: No longer required due to age.  Lung Cancer Screening: (Low Dose CT Chest recommended if Age 56-80 years, 30 pack-year currently smoking OR have quit w/in 15years.) does not qualify.   Additional Screening:  Hepatitis C Screening: does not qualify; Completed no  Vision Screening: Recommended annual ophthalmology exams for early detection of glaucoma and other disorders of the eye. Is the patient up to date with their annual eye exam?  Yes  Who is the  provider or what is the name of the office in which the patient attends annual eye exams? [place] If pt is not established with a provider, would they like to be referred to a provider to establish care? No .   Dental Screening: Recommended annual dental exams for proper oral hygiene  Community Resource Referral / Chronic Care Management: CRR required this visit?  No   CCM required this visit?  No      Plan:     I have personally reviewed and noted the following in the patient's chart:   Medical and social history Use of alcohol, tobacco or illicit drugs  Current medications and supplements including opioid prescriptions.  Functional ability and status Nutritional status Physical activity Advanced directives List of other physicians Hospitalizations, surgeries, and ER visits in previous 12 months Vitals Screenings to include cognitive, depression, and falls Referrals and appointments  In addition, I have reviewed and discussed with patient certain preventive protocols, quality metrics, and best practice recommendations. A written personalized care plan for preventive services as well as general preventive health recommendations were provided to patient.     Dionisio David, LPN   [id number]   Nurse Notes: none

## 2022-07-12 ENCOUNTER — Other Ambulatory Visit: Payer: Self-pay | Admitting: Family Medicine

## 2022-07-12 ENCOUNTER — Other Ambulatory Visit: Payer: Self-pay | Admitting: Family

## 2022-07-12 MED ORDER — LOSARTAN POTASSIUM 50 MG PO TABS: 50.0000 mg | ORAL_TABLET | Freq: Two times a day (BID) | ORAL | 1 refills | Status: DC

## 2022-07-12 NOTE — Telephone Encounter (Signed)
P's daughter Basil Dess called saying her mom is having a very hard time getting the [place] form the pharmacy.  She said this is the last medication to be transferred over to [facility] from her previous provider.  She will be out Monday am.   They would ike the rx asap.  [facility] uses [facility] [address].  [id number]  [phone]

## 2022-07-12 NOTE — Telephone Encounter (Signed)
Requested medication (s) are due for refill today - provider review   Requested medication (s) are on the active medication list -yes  Future visit scheduled -yes  Last refill: 02/03/22  Notes to clinic: historical provider/medication, fails lab protocol- none on file  Requested Prescriptions  Pending Prescriptions Disp Refills   atorvastatin (LIPITOR) 10 MG tablet [Pharmacy Med Name: ATORVASTATIN 10 MG TABLET] 90 tablet 1    Sig: TAKE 1 TABLET BY MOUTH EVERY DAY     Cardiovascular:  Antilipid - Statins Failed - 07/12/2022 10:11 AM      Failed - Lipid Panel in normal range within the last 12 months    No results found for: "CHOL", "POCCHOL", "CHOLTOT" No results found for: "LDLCALC", "LDLC", "HIRISKLDL", "POCLDL", "LDLDIRECT", "REALLDLC", "TOTLDLC" No results found for: "HDL", "POCHDL" No results found for: "TRIG", "POCTRIG"       Passed - Patient is not pregnant      Passed - Valid encounter within last 12 months    Recent Outpatient Visits           1 month ago    [facility], Demetrios Isaacs, MD   3 months ago Irritability   [facility] [place], [place], PA-C   3 months ago Paroxysmal atrial fibrillation [facility])   [facility] T, FNP       Future Appointments             In 2 months [name], FNP [facility], [facility]               Requested Prescriptions  Pending Prescriptions Disp Refills   atorvastatin (LIPITOR) 10 MG tablet [Pharmacy Med Name: ATORVASTATIN 10 MG TABLET] 90 tablet 1    Sig: TAKE 1 TABLET BY MOUTH EVERY DAY     Cardiovascular:  Antilipid - Statins Failed - 07/12/2022 10:11 AM      Failed - Lipid Panel in normal range within the last 12 months    No results found for: "CHOL", "POCCHOL", "CHOLTOT" No results found for: "LDLCALC", "LDLC", "HIRISKLDL", "POCLDL", "LDLDIRECT", "REALLDLC", "TOTLDLC" No results found for: "HDL", "POCHDL" No results found for: "TRIG",  "POCTRIG"       Passed - Patient is not pregnant      Passed - Valid encounter within last 12 months    Recent Outpatient Visits           1 month ago    [facility] Malva Limes, MD   3 months ago Irritability   [facility] [place], [place], PA-C   3 months ago Paroxysmal atrial fibrillation [facility])   [facility] [name], FNP       Future Appointments             In 2 months [name], FNP [facility], [facility]

## 2022-07-12 NOTE — Addendum Note (Signed)
Addended by: Wilford Corner on: 07/12/2022 04:43 PM   Modules accepted: Orders

## 2022-07-12 NOTE — Telephone Encounter (Signed)
Requested medication (s) are due for refill today: Yes  Requested medication (s) are on the active medication list: Yes  Last refill:  02/03/22  Future visit scheduled: Yes  Notes to clinic:  Unable to refill per protocol, last refill by another provider. Wanting Robynn Pane to start prescribing, will be out on Monday, would like refill sent today.      Requested Prescriptions  Pending Prescriptions Disp Refills   losartan (COZAAR) 50 MG tablet      Sig: Take 1 tablet (50 mg total) by mouth 2 (two) times daily.     Cardiovascular:  Angiotensin Receptor Blockers Failed - 07/12/2022  4:43 PM      Failed - K in normal range and within 180 days    Potassium  Date Value Ref Range Status  04/08/2022 3.4 (L) 3.5 - 5.1 mmol/L Final         Passed - Cr in normal range and within 180 days    Creatinine, Ser  Date Value Ref Range Status  04/08/2022 0.97 0.44 - 1.00 mg/dL Final         Passed - Patient is not pregnant      Passed - Last BP in normal range    BP Readings from Last 1 Encounters:  06/10/22 122/74         Passed - Valid encounter within last 6 months    Recent Outpatient Visits           1 month ago    [facility] Malva Limes, MD   3 months ago Irritability   [facility] [place], [place], PA-C   3 months ago Paroxysmal atrial fibrillation [facility])   [facility] [name], FNP       Future Appointments             In 2 months [name], FNP [facility], [facility]

## 2022-08-12 ENCOUNTER — Other Ambulatory Visit: Payer: Self-pay | Admitting: Family Medicine

## 2022-08-28 ENCOUNTER — Ambulatory Visit (INDEPENDENT_AMBULATORY_CARE_PROVIDER_SITE_OTHER): Payer: Medicare PPO

## 2022-08-28 DIAGNOSIS — I5042 Chronic combined systolic (congestive) and diastolic (congestive) heart failure: Secondary | ICD-10-CM

## 2022-09-02 LAB — CUP PACEART REMOTE DEVICE CHECK
Battery Impedance: 2162 Ohm
Battery Remaining Longevity: 33 mo
Battery Voltage: 2.75 V
Brady Statistic RV Percent Paced: 93 %
Date Time Interrogation Session: 20230924150156
Implantable Lead Implant Date: 20070523
Implantable Lead Implant Date: 20070523
Implantable Lead Location: 753862
Implantable Lead Location: 753862
Implantable Lead Model: 4092
Implantable Lead Model: 5076
Implantable Pulse Generator Implant Date: 20151028
Lead Channel Impedance Value: 67 Ohm
Lead Channel Impedance Value: 678 Ohm
Lead Channel Pacing Threshold Amplitude: 0.5 V
Lead Channel Pacing Threshold Pulse Width: 0.4 ms
Lead Channel Setting Pacing Amplitude: 2 V
Lead Channel Setting Pacing Pulse Width: 0.4 ms
Lead Channel Setting Sensing Sensitivity: 2 mV

## 2022-09-10 NOTE — Progress Notes (Signed)
Remote pacemaker transmission.   

## 2022-09-25 NOTE — Progress Notes (Unsigned)
Complete physical exam   Patient: [name]   DOB: 1925/05/31   88 y.o. Female  MRN: [id number] Visit Date: 09/26/2022  Today's healthcare provider: [name], FNP  Introduced to nurse practitioner role and practice setting.  All questions answered.  Discussed provider/patient relationship and expectations.  I,Meeyah Ovitt J Jovie Swanner,acting as a scribe for [name], FNP.,have documented all relevant documentation on the behalf of [name], FNP,as directed by  [name], FNP while in the presence of [name], FNP.   Chief Complaint  Patient presents with   Annual Exam   Subjective    [name] is a 88 y.o. female who presents today for a complete physical exam.  She reports consuming a  heart healthy  diet.  Home exercise includes walking 30 minutes/day and chair exercises at daycare  She generally feels well. She reports sleeping fairly well. She does not have additional problems to discuss today.  HPI   Past Medical History:  Diagnosis Date   Arrhythmia    atrial fibrillation   Congestive heart failure (CHF) (HCC)    Hypertension    Pacemaker 2015   Past Surgical History:  Procedure Laterality Date   ABDOMINAL HYSTERECTOMY     EYE SURGERY     MOHS SURGERY  2023   PACEMAKER PLACEMENT N/A 2007   2017   Social History   Socioeconomic History   Marital status: Widowed    Spouse name: Not on file   Number of children: Not on file   Years of education: Not on file   Highest education level: Not on file  Occupational History   Not on file  Tobacco Use   Smoking status: Never   Smokeless tobacco: Never  Substance and Sexual Activity   Alcohol use: Not Currently   Drug use: Never   Sexual activity: Not on file  Other Topics Concern   Not on file  Social History Narrative   Not on file   Social Determinants of Health   Financial Resource Strain: Low Risk  (07/04/2022)   Overall Financial Resource Strain (CARDIA)    Difficulty of Paying  Living Expenses: Not hard at all  Food Insecurity: No Food Insecurity (07/04/2022)   Hunger Vital Sign    Worried About Running Out of Food in the Last Year: Never true    Ran Out of Food in the Last Year: Never true  Transportation Needs: No Transportation Needs (07/04/2022)   PRAPARE - Administrator, Civil Service (Medical): No    Lack of Transportation (Non-Medical): No  Physical Activity: Insufficiently Active (07/04/2022)   Exercise Vital Sign    Days of Exercise per Week: 2 days    Minutes of Exercise per Session: 20 min  Stress: No Stress Concern Present (07/04/2022)   [employer] of Occupational Health - Occupational Stress Questionnaire    Feeling of Stress : Not at all  Social Connections: Moderately Isolated (07/04/2022)   Social Connection and Isolation Panel [NHANES]    Frequency of Communication with Friends and Family: More than three times a week    Frequency of Social Gatherings with Friends and Family: More than three times a week    Attends Religious Services: More than 4 times per year    Active Member of [employer] or Organizations: No    Attends Banker Meetings: Never    Marital Status: Widowed  Intimate Partner Violence: Not At Risk (07/04/2022)  Humiliation, Afraid, Rape, and Kick questionnaire    Fear of Current or Ex-Partner: No    Emotionally Abused: No    Physically Abused: No    Sexually Abused: No   Family Status  Relation Name Status   Mother  Deceased   Father  Deceased   Daughter  Alive   Family History  Problem Relation Age of Onset   Heart failure Mother    Heart attack Father    Heart attack Daughter    Allergies  Allergen Reactions   Moxifloxacin Itching and Swelling    Swelling and itching of the eyes.   Tobramycin Itching and Swelling    Swelling and itching of the eyelids.   Codeine     Other reaction(s): Hallucination    Patient Care Team: Jacky KindlePayne, Elise T, FNP as PCP - General (Family  Medicine) Lanier PrudeLambert, Cameron T, MD as PCP - Electrophysiology (Cardiology)   Medications: Outpatient Medications Prior to Visit  Medication Sig   amLODipine (NORVASC) 5 MG tablet TAKE 1 TABLET BY MOUTH EVERY DAY   atorvastatin (LIPITOR) 10 MG tablet Take 10 mg by mouth daily.   calcium carbonate (OS-CAL - DOSED IN MG OF ELEMENTAL CALCIUM) 1250 (500 Ca) MG tablet Take 1 tablet by mouth 2 (two) times daily with a meal.   dipyridamole-aspirin (AGGRENOX) 200-25 MG 12hr capsule TAKE 1 CAPSULE BY MOUTH TWICE A DAY   JARDIANCE 10 MG TABS tablet TAKE 1 TABLET BY MOUTH DAILY BEFORE BREAKFAST.   loratadine (CLARITIN) 10 MG tablet Take 10 mg by mouth daily.   losartan (COZAAR) 50 MG tablet TAKE 1 TABLET BY MOUTH TWICE A DAY   metoprolol (TOPROL-XL) 200 MG 24 hr tablet TAKE 1 TABLET BY MOUTH EVERY DAY   Multiple Vitamins-Minerals (PRESERVISION AREDS 2 PO) Take 1 tablet by mouth in the morning and at bedtime.   multivitamin-iron-minerals-folic acid (CENTRUM) chewable tablet Chew 1 tablet by mouth daily.   Omega-3 Fatty Acids (FISH OIL) 1000 MG CAPS Take 1 capsule by mouth daily.   potassium chloride (KLOR-CON) 10 MEQ tablet Take 10 mEq by mouth as needed. Only if she takes an additional dose of lasix   torsemide (DEMADEX) 20 MG tablet TAKE 2 TABLETS (40 MG TOTAL) BY MOUTH DAILY. AND ADDITIONAL 20MG  IF NEEDED   [DISCONTINUED] doxycycline (VIBRAMYCIN) 100 MG capsule    [DISCONTINUED] furosemide (LASIX) 40 MG tablet    No facility-administered medications prior to visit.    Review of Systems   Objective    BP 128/70 (BP Location: Left Arm, Patient Position: Sitting, Cuff Size: Normal)   Pulse 72   Resp 16   Ht 4\' 11"  (1.499 m)   Wt 111 lb (50.3 kg)   LMP  (LMP Unknown)   SpO2 98%   BMI 22.42 kg/m   Physical Exam Vitals and nursing note reviewed.  Constitutional:      General: She is awake. She is not in acute distress.    Appearance: Normal appearance. She is well-developed, well-groomed and  normal weight. She is not ill-appearing, toxic-appearing or diaphoretic.  HENT:     Head: Normocephalic and atraumatic.     Jaw: There is normal jaw occlusion. No trismus, tenderness, swelling or pain on movement.     Right Ear: Tympanic membrane, ear canal and external ear normal. Decreased hearing noted. There is no impacted cerumen.     Left Ear: Tympanic membrane, ear canal and external ear normal. Decreased hearing noted. There is no impacted cerumen.  Ears:     Comments: Use of bilateral hearing aids     Nose: Nose normal. No congestion or rhinorrhea.     Right Turbinates: Not enlarged, swollen or pale.     Left Turbinates: Not enlarged, swollen or pale.     Right Sinus: No maxillary sinus tenderness or frontal sinus tenderness.     Left Sinus: No maxillary sinus tenderness or frontal sinus tenderness.     Mouth/Throat:     Lips: Pink.     Mouth: Mucous membranes are moist. No injury.     Tongue: No lesions.     Pharynx: Oropharynx is clear. Uvula midline. No pharyngeal swelling, oropharyngeal exudate, posterior oropharyngeal erythema or uvula swelling.     Tonsils: No tonsillar exudate or tonsillar abscesses.  Eyes:     General: Lids are normal. Lids are everted, no foreign bodies appreciated. Vision grossly intact. Gaze aligned appropriately. No allergic shiner or visual field deficit.       Right eye: No discharge.        Left eye: No discharge.     Extraocular Movements: Extraocular movements intact.     Conjunctiva/sclera: Conjunctivae normal.     Right eye: Right conjunctiva is not injected. No exudate.    Left eye: Left conjunctiva is not injected. No exudate.    Pupils: Pupils are equal, round, and reactive to light.     Comments: Chronic vision concerns; wears glasses   Neck:     Thyroid: No thyroid mass, thyromegaly or thyroid tenderness.     Vascular: No carotid bruit.     Trachea: Trachea normal.  Cardiovascular:     Rate and Rhythm: Normal rate and regular  rhythm.     Pulses: Normal pulses.          Carotid pulses are 2+ on the right side and 2+ on the left side.      Radial pulses are 2+ on the right side and 2+ on the left side.       Dorsalis pedis pulses are 2+ on the right side and 2+ on the left side.       Posterior tibial pulses are 2+ on the right side and 2+ on the left side.     Heart sounds: Normal heart sounds, S1 normal and S2 normal. No murmur heard.    No friction rub. No gallop.  Pulmonary:     Effort: Pulmonary effort is normal. No respiratory distress.     Breath sounds: Normal breath sounds and air entry. No stridor. No wheezing, rhonchi or rales.  Chest:     Chest wall: No tenderness.  Abdominal:     General: Abdomen is flat. Bowel sounds are normal. There is no distension.     Palpations: Abdomen is soft. There is no mass.     Tenderness: There is no abdominal tenderness. There is no right CVA tenderness, left CVA tenderness, guarding or rebound.     Hernia: No hernia is present.  Genitourinary:    Comments: Exam deferred; denies complaints Musculoskeletal:        General: No swelling, tenderness, deformity or signs of injury. Normal range of motion.     Cervical back: Full passive range of motion without pain, normal range of motion and neck supple. No edema, rigidity or tenderness. No muscular tenderness.     Right lower leg: No edema.     Left lower leg: No edema.  Lymphadenopathy:     Cervical: No cervical adenopathy.  Right cervical: No superficial, deep or posterior cervical adenopathy.    Left cervical: No superficial, deep or posterior cervical adenopathy.  Skin:    General: Skin is warm and dry.     Capillary Refill: Capillary refill takes less than 2 seconds.     Coloration: Skin is not jaundiced or pale.     Findings: No bruising, erythema, lesion or rash.  Neurological:     General: No focal deficit present.     Mental Status: She is alert and oriented to person, place, and time. Mental status is  at baseline.     GCS: GCS eye subscore is 4. GCS verbal subscore is 5. GCS motor subscore is 6.     Sensory: Sensation is intact. No sensory deficit.     Motor: Motor function is intact. No weakness.     Coordination: Coordination is intact. Coordination normal.     Gait: Gait is intact. Gait normal.  Psychiatric:        Attention and Perception: Attention and perception normal.        Mood and Affect: Mood and affect normal.        Speech: Speech normal.        Behavior: Behavior normal. Behavior is cooperative.        Thought Content: Thought content normal.        Cognition and Memory: Cognition and memory normal.        Judgment: Judgment normal.      Last depression screening scores    09/26/2022    2:51 PM 07/04/2022    3:28 PM 03/27/2022    2:25 PM  PHQ 2/9 Scores  PHQ - 2 Score 0 0 0  PHQ- 9 Score 0 0 2   Last fall risk screening    09/26/2022    2:51 PM  Fall Risk   Falls in the past year? 1  Number falls in past yr: 1  Injury with Fall? 0   Last Audit-C alcohol use screening    09/26/2022    2:52 PM  Alcohol Use Disorder Test (AUDIT)  1. How often do you have a drink containing alcohol? 0  2. How many drinks containing alcohol do you have on a typical day when you are drinking? 0  3. How often do you have six or more drinks on one occasion? 0  AUDIT-C Score 0   A score of 3 or more in women, and 4 or more in men indicates increased risk for alcohol abuse, EXCEPT if all of the points are from question 1   No results found for any visits on 09/26/22.  Assessment & Plan    Routine Health Maintenance and Physical Exam  Exercise Activities and Dietary recommendations  Goals      DIET - EAT MORE FRUITS AND VEGETABLES        There is no immunization history for the selected administration types on file for this patient.  Health Maintenance  Topic Date Due   DEXA SCAN  Never done   COVID-19 Vaccine (1) 10/12/2022 (Originally 01/17/1926)   Zoster  Vaccines- Shingrix (1 of 2) 12/27/2022 (Originally [birth year]/16/1977)   INFLUENZA VACCINE  03/09/2023 (Originally 07/09/2022)   Pneumonia Vaccine 22+ Years old (1 - PCV) 09/27/2023 (Originally 07/16/1992)   TETANUS/TDAP  09/27/2023 (Originally 07/16/1944)   HPV VACCINES  Aged Out    Discussed health benefits of physical activity, and encouraged her to engage in regular exercise appropriate for her age and condition.  Problem List Items Addressed This Visit       Other   Annual physical exam - Primary    Due for vision UTD on dental; notes recently pulling of a tooth Continues to go to daycare program 5x/week and works on walking outside of chair exercise program at daycare Denies falls; notes some change in balance and restless leg symptoms Things to do to keep yourself healthy  - Exercise at least 30-45 minutes a day, 3-4 days a week.  - Eat a low-fat diet with lots of fruits and vegetables, up to 7-9 servings per day.  - Seatbelts can save your life. Wear them always.  - Smoke detectors on every level of your home, check batteries every year.  - Eye Doctor - have an eye exam every 1-2 years  - Safe sex - if you may be exposed to STDs, use a condom.  - Alcohol -  If you drink, do it moderately, less than 2 drinks per day.  - [place]. Choose someone to speak for you if you are not able.  - Depression is common in our stressful world.If you're feeling down or losing interest in things you normally enjoy, please come in for a visit.  - Violence - If anyone is threatening or hurting you, please call immediately.  Declines DEXA d/t age; "counting the months"       Relevant Orders   Comprehensive Metabolic Panel (CMET)   CBC with Differential/Platelet   Return in about 6 months (around 03/28/2023) for chonic disease management.    Vonna Kotyk, FNP, have reviewed all documentation for this visit. The documentation on 09/26/22 for the exam, diagnosis, procedures, and  orders are all accurate and complete.  Gwyneth Sprout, [place] [phone] (phone) [phone] (fax)  [place]

## 2022-09-26 ENCOUNTER — Encounter: Payer: Self-pay | Admitting: Family Medicine

## 2022-09-26 ENCOUNTER — Ambulatory Visit (INDEPENDENT_AMBULATORY_CARE_PROVIDER_SITE_OTHER): Payer: Medicare PPO | Admitting: Family Medicine

## 2022-09-26 VITALS — BP 128/70 | HR 72 | Resp 16 | Ht 59.0 in | Wt 111.0 lb

## 2022-09-26 DIAGNOSIS — Z0000 Encounter for general adult medical examination without abnormal findings: Secondary | ICD-10-CM | POA: Diagnosis not present

## 2022-09-26 NOTE — Assessment & Plan Note (Signed)
Due for vision UTD on dental; notes recently pulling of a tooth Continues to go to daycare program 5x/week and works on walking outside of chair exercise program at daycare Denies falls; notes some change in balance and restless leg symptoms Things to do to keep yourself healthy  - Exercise at least 30-45 minutes a day, 3-4 days a week.  - Eat a low-fat diet with lots of fruits and vegetables, up to 7-9 servings per day.  - Seatbelts can save your life. Wear them always.  - Smoke detectors on every level of your home, check batteries every year.  - Eye Doctor - have an eye exam every 1-2 years  - Safe sex - if you may be exposed to STDs, use a condom.  - Alcohol -  If you drink, do it moderately, less than 2 drinks per day.  - [place]. Choose someone to speak for you if you are not able.  - Depression is common in our stressful world.If you're feeling down or losing interest in things you normally enjoy, please come in for a visit.  - Violence - If anyone is threatening or hurting you, please call immediately.  Declines DEXA d/t age; "counting the months"

## 2022-09-27 LAB — COMPREHENSIVE METABOLIC PANEL
ALT: 20 IU/L (ref 0–32)
AST: 27 IU/L (ref 0–40)
Albumin/Globulin Ratio: 1.4 (ref 1.2–2.2)
Albumin: 3.9 g/dL (ref 3.6–4.6)
Alkaline Phosphatase: 71 IU/L (ref 44–121)
BUN/Creatinine Ratio: 33 — ABNORMAL HIGH (ref 12–28)
BUN: 32 mg/dL (ref 10–36)
Bilirubin Total: 0.4 mg/dL (ref 0.0–1.2)
CO2: 26 mmol/L (ref 20–29)
Calcium: 9.1 mg/dL (ref 8.7–10.3)
Chloride: 96 mmol/L (ref 96–106)
Creatinine, Ser: 0.97 mg/dL (ref 0.57–1.00)
Globulin, Total: 2.7 g/dL (ref 1.5–4.5)
Glucose: 97 mg/dL (ref 70–99)
Potassium: 4.4 mmol/L (ref 3.5–5.2)
Sodium: 136 mmol/L (ref 134–144)
Total Protein: 6.6 g/dL (ref 6.0–8.5)
eGFR: 54 mL/min/{1.73_m2} — ABNORMAL LOW (ref >59)

## 2022-09-27 LAB — CBC WITH DIFFERENTIAL/PLATELET
Basophils Absolute: 0 10*3/uL (ref 0.0–0.2)
Basos: 0 %
EOS (ABSOLUTE): 0.1 10*3/uL (ref 0.0–0.4)
Eos: 3 %
Hematocrit: 36.4 % (ref 34.0–46.6)
Hemoglobin: 11.7 g/dL (ref 11.1–15.9)
Immature Grans (Abs): 0 10*3/uL (ref 0.0–0.1)
Immature Granulocytes: 0 %
Lymphocytes Absolute: 1.1 10*3/uL (ref 0.7–3.1)
Lymphs: 25 %
MCH: 32 pg (ref 26.6–33.0)
MCHC: 32.1 g/dL (ref 31.5–35.7)
MCV: 100 fL — ABNORMAL HIGH (ref 79–97)
Monocytes Absolute: 0.5 10*3/uL (ref 0.1–0.9)
Monocytes: 11 %
Neutrophils Absolute: 2.7 10*3/uL (ref 1.4–7.0)
Neutrophils: 61 %
Platelets: 183 10*3/uL (ref 150–450)
RBC: 3.66 x10E6/uL — ABNORMAL LOW (ref 3.77–5.28)
RDW: 12.2 % (ref 11.7–15.4)
WBC: 4.5 10*3/uL (ref 3.4–10.8)

## 2022-09-27 NOTE — Progress Notes (Signed)
Normal stable labs; Stage 2 CKD noted. Very stable given patient's age.  [name], [name] [place], [place] [address] [phone] (phone) [phone] (fax) [place]

## 2022-10-09 ENCOUNTER — Emergency Department: Payer: Medicare PPO

## 2022-10-09 ENCOUNTER — Other Ambulatory Visit: Payer: Self-pay

## 2022-10-09 ENCOUNTER — Emergency Department
Admission: EM | Admit: 2022-10-09 | Discharge: 2022-10-09 | Disposition: A | Discharge status: Home / Self Care | Payer: Medicare PPO | Attending: Emergency Medicine | Admitting: Emergency Medicine

## 2022-10-09 DIAGNOSIS — W01198A Fall on same level from slipping, tripping and stumbling with subsequent striking against other object, initial encounter: Secondary | ICD-10-CM | POA: Insufficient documentation

## 2022-10-09 DIAGNOSIS — I110 Hypertensive heart disease with heart failure: Secondary | ICD-10-CM | POA: Diagnosis not present

## 2022-10-09 DIAGNOSIS — S0990XA Unspecified injury of head, initial encounter: Secondary | ICD-10-CM | POA: Diagnosis present

## 2022-10-09 DIAGNOSIS — I509 Heart failure, unspecified: Secondary | ICD-10-CM | POA: Diagnosis not present

## 2022-10-09 DIAGNOSIS — S0083XA Contusion of other part of head, initial encounter: Secondary | ICD-10-CM | POA: Insufficient documentation

## 2022-10-09 DIAGNOSIS — T148XXA Other injury of unspecified body region, initial encounter: Secondary | ICD-10-CM

## 2022-10-09 DIAGNOSIS — W19XXXA Unspecified fall, initial encounter: Secondary | ICD-10-CM

## 2022-10-09 NOTE — ED Triage Notes (Signed)
Pt presents to ED with /co of slipping out of her recliner. Pt denies Loc. NAD noted. Pt was at [employer] when this happened. Pt is A&Ox4. Pt has bruise and swelling to L side of her forehead.   Pt takes aggrenox,

## 2022-10-09 NOTE — ED Provider Notes (Signed)
   [facility] Provider Note    Event Date/Time   First MD Initiated Contact with Patient 10/09/22 1700     (approximate)  History   Chief Complaint: Fall  HPI  [name] is a 88 y.o. female with a past medical history of CHF, hypertension who presents to the emergency department after a fall.  According to the patient she was trying to get into recliner when she misstepped causing her to fall hitting her head on the ground.  Patient suffered a hematoma to her left forehead.  Patient denies LOC.  Denies vomiting.  Overall the patient appears well, no distress.  Patient denies any symptoms.  Patient has not been ambulatory but denies any pain in any extremity.  Patient uses a walker to ambulate at baseline.  Physical Exam   Triage Vital Signs: ED Triage Vitals  Enc Vitals Group     BP 10/09/22 1504 129/65     Pulse Rate 10/09/22 1504 63     Resp 10/09/22 1504 18     Temp 10/09/22 1504 97.7 F (36.5 C)     Temp Source 10/09/22 1504 Oral     SpO2 10/09/22 1504 99 %     Weight --      Height --      Head Circumference --      Peak Flow --      Pain Score 10/09/22 1505 0     Pain Loc --      Pain Edu? --      Excl. in [place]? --     Most recent vital signs: Vitals:   10/09/22 1504  BP: 129/65  Pulse: 63  Resp: 18  Temp: 97.7 F (36.5 C)  SpO2: 99%    General: Awake, no distress.  Moderate left forehead hematoma with a small abrasion. CV:  Good peripheral perfusion.  Regular rate and rhythm  Resp:  Normal effort.  Equal breath sounds bilaterally.  Abd:  No distention.  Soft, nontender.  No rebound or guarding. Other:  Nontender hips, good range of motion all extremities.  No chest or abdominal tenderness.   ED Results / Procedures / Treatments   RADIOLOGY  I have reviewed and interpreted the CT head images.  No intracranial hemorrhage seen on my evaluation. Radiology is read the CT scan is negative for acute intracranial finding.  Negative  for C-spine fracture.   MEDICATIONS ORDERED IN ED: Medications - No data to display   IMPRESSION / MDM / [place] / ED COURSE  I reviewed the triage vital signs and the nursing notes.  Patient's presentation is most consistent with acute presentation with potential threat to life or bodily function.  Patient presents emergency department after a mechanical fall.  Overall the patient appears well, no distress, reassuring vitals, reassuring physical exam besides moderate hematoma to her left forehead.  Given the patient's age and hematoma we will obtain CT imaging of the head and C-spine.  CT scan of the head and C-spine are negative for acute abnormality besides the hematoma.  Discussed with the patient Tylenol as needed for discomfort.  Patient daughter agreeable to plan of care.  We will discharge home.  FINAL CLINICAL IMPRESSION(S) / ED DIAGNOSES   Fall Head injury Hematoma    Note:  This document was prepared using Dragon voice recognition software and may include unintentional dictation errors.   Harvest Dark, MD 10/09/22 1729

## 2022-10-09 NOTE — ED Provider Triage Note (Signed)
Emergency Medicine Provider Triage Evaluation Note  [name] , a 88 y.o. female  was evaluated in triage.  Pt complains of facial bruising after sliding out of her recliner at her day program and hitting her forehead on the floor. She denies loss of consciousness. She is on blood thinner.  Physical Exam  LMP  (LMP Unknown)  Gen:   Awake, no distress   Resp:  Normal effort  MSK:   Moves extremities without difficulty  Other:  Contusion to forehead with abrasion above glasses on left side.  Medical Decision Making  Medically screening exam initiated at 3:03 PM.  Appropriate orders placed.  [name] was informed that the remainder of the evaluation will be completed by another provider, this initial triage assessment does not replace that evaluation, and the importance of remaining in the ED until their evaluation is complete.     Victorino Dike, FNP 10/09/22 1516

## 2022-10-09 NOTE — ED Triage Notes (Signed)
First RN: pt from adult daycare via EMS with reports that pt slid from recliner and fell hitting forehead with hematoma noted. No LOC. Episode was witnessed. Pt takes aggrenox.   150/82 94RA 98HR

## 2022-10-14 ENCOUNTER — Ambulatory Visit: Payer: Self-pay | Admitting: *Deleted

## 2022-10-14 ENCOUNTER — Other Ambulatory Visit: Payer: Self-pay | Admitting: Family Medicine

## 2022-10-14 NOTE — Telephone Encounter (Signed)
Reason for Disposition . [1] Prescription refill request for NON-ESSENTIAL medicine (i.e., no harm to patient if med not taken) AND [2] triager unable to refill per department policy  Protocols used: Medication Refill and Renewal Call-A-AH  

## 2022-10-14 NOTE — Telephone Encounter (Signed)
Summary: Pt daughter frustrated as states office can not get this med straight wants fu asap   Pls call daughter Pilar Grammes at [phone], re Disp Refills Start End atorvastatin (LIPITOR) 10 MG tablet, she says that Daneil Dan has been refilling for her mom and that she is out of med and that [facility] a couple of [facility], (do not see) she states that this happens everytime and she wants to talk to someone who can get it straightened out as [facility] dr not responding, Daughter is upset, sure maybe NT can see more than I can see... wants fu asap         Patient is out of medication- she needs Lipitor filled. Call to patient daughter- they have been trying to fill this medication since last week. Medication is listed as historical so [facility] is unable to fill. Request Rx today as patient is going to be out. Answer Assessment - Initial Assessment Questions 1. DRUG NAME: "What medicine do you need to have refilled?"     Atorvastatin 10 mg 2. REFILLS REMAINING: "How many refills are remaining?" (Note: The label on the medicine or pill bottle will show how many refills are remaining. If there are no refills remaining, then a renewal may be needed.)     none 3. EXPIRATION DATE: "What is the expiration date?" (Note: The label states when the prescription will expire, and thus can no longer be refilled.)       4. PRESCRIBING HCP: "Who prescribed it?" Reason: If prescribed by specialist, call should be referred to that group.     Listed as historical Daughter would like to have this medication filled today- they have sent multiple request and had to get 3 pills to use over week end.  Protocols used: Medication Refill and Renewal Call-A-AH

## 2022-10-15 MED ORDER — ATORVASTATIN CALCIUM 10 MG PO TABS: 10.0000 mg | ORAL_TABLET | Freq: Every day | ORAL | 1 refills | Status: DC

## 2022-10-15 NOTE — Telephone Encounter (Addendum)
Patient is out of this medication and needs asap.  I see on triage encounter it says that this was from historical provider.  Pt's daughter states that this is because this pt came in as a new pt and has not needed refills until now.

## 2022-10-15 NOTE — Telephone Encounter (Signed)
Medicine ordered

## 2022-10-15 NOTE — Addendum Note (Signed)
Addended by: Doristine Devoid on: 10/15/2022 10:28 AM   Modules accepted: Orders

## 2022-10-15 NOTE — Telephone Encounter (Signed)
Patient was just seen recently. Atorvastatin refilled

## 2022-10-16 ENCOUNTER — Other Ambulatory Visit: Payer: Self-pay | Admitting: Family Medicine

## 2022-10-16 NOTE — Telephone Encounter (Signed)
Patient advised that medication was sent to pharmacy.

## 2022-11-10 ENCOUNTER — Other Ambulatory Visit: Payer: Self-pay | Admitting: Family Medicine

## 2022-11-11 NOTE — Telephone Encounter (Signed)
Requested Prescriptions  Pending Prescriptions Disp Refills   amLODipine (NORVASC) 5 MG tablet [Pharmacy Med Name: AMLODIPINE BESYLATE 5 MG TAB] 90 tablet 0    Sig: TAKE 1 TABLET BY MOUTH EVERY DAY     Cardiovascular: Calcium Channel Blockers 2 Passed - 11/10/2022  1:18 AM      Passed - Last BP in normal range    BP Readings from Last 1 Encounters:  10/09/22 130/66         Passed - Last Heart Rate in normal range    Pulse Readings from Last 1 Encounters:  10/09/22 66         Passed - Valid encounter within last 6 months    Recent Outpatient Visits           1 month ago Annual physical exam   [facility] Merita Norton T, FNP   5 months ago Cough, unspecified type   [facility] Malva Limes, MD   7 months ago Irritability   [facility] Pierson, Kief, PA-C   7 months ago Paroxysmal atrial fibrillation [facility])   [facility] [name], [place]

## 2022-11-20 ENCOUNTER — Other Ambulatory Visit: Payer: Self-pay | Admitting: Family

## 2022-11-27 ENCOUNTER — Ambulatory Visit (INDEPENDENT_AMBULATORY_CARE_PROVIDER_SITE_OTHER): Payer: Medicare PPO

## 2022-11-27 DIAGNOSIS — I5043 Acute on chronic combined systolic (congestive) and diastolic (congestive) heart failure: Secondary | ICD-10-CM

## 2022-12-04 LAB — CUP PACEART REMOTE DEVICE CHECK
Battery Impedance: 2313 Ohm
Battery Remaining Longevity: 31 mo
Battery Voltage: 2.76 V
Brady Statistic RV Percent Paced: 93 %
Date Time Interrogation Session: 20231226110515
Implantable Lead Connection Status: 753985
Implantable Lead Connection Status: 753985
Implantable Lead Implant Date: 20070523
Implantable Lead Implant Date: 20070523
Implantable Lead Location: 753862
Implantable Lead Location: 753862
Implantable Lead Model: 4092
Implantable Lead Model: 5076
Implantable Pulse Generator Implant Date: 20151028
Lead Channel Impedance Value: 67 Ohm
Lead Channel Impedance Value: 675 Ohm
Lead Channel Pacing Threshold Amplitude: 0.5 V
Lead Channel Pacing Threshold Pulse Width: 0.4 ms
Lead Channel Setting Pacing Amplitude: 2 V
Lead Channel Setting Pacing Pulse Width: 0.4 ms
Lead Channel Setting Sensing Sensitivity: 2 mV
Zone Setting Status: 755011
Zone Setting Status: 755011

## 2022-12-11 ENCOUNTER — Other Ambulatory Visit: Payer: Self-pay | Admitting: Family Medicine

## 2022-12-23 NOTE — Progress Notes (Signed)
Remote pacemaker transmission.   

## 2022-12-27 ENCOUNTER — Other Ambulatory Visit: Payer: Self-pay | Admitting: Family

## 2022-12-31 ENCOUNTER — Other Ambulatory Visit: Payer: Self-pay | Admitting: Family

## 2022-12-31 ENCOUNTER — Other Ambulatory Visit: Payer: Self-pay | Admitting: Family Medicine

## 2023-01-02 ENCOUNTER — Other Ambulatory Visit: Payer: Self-pay | Admitting: Family

## 2023-01-22 ENCOUNTER — Other Ambulatory Visit: Payer: Self-pay | Admitting: Family

## 2023-01-31 ENCOUNTER — Other Ambulatory Visit: Payer: Self-pay | Admitting: Family

## 2023-02-18 ENCOUNTER — Ambulatory Visit: Payer: Medicare PPO | Attending: Family | Admitting: Family

## 2023-02-18 ENCOUNTER — Encounter: Payer: Self-pay | Admitting: Family

## 2023-02-18 VITALS — BP 121/59 | HR 81 | Wt 113.5 lb

## 2023-02-18 DIAGNOSIS — I4821 Permanent atrial fibrillation: Secondary | ICD-10-CM | POA: Diagnosis not present

## 2023-02-18 DIAGNOSIS — I5032 Chronic diastolic (congestive) heart failure: Secondary | ICD-10-CM | POA: Insufficient documentation

## 2023-02-18 DIAGNOSIS — I890 Lymphedema, not elsewhere classified: Secondary | ICD-10-CM | POA: Diagnosis not present

## 2023-02-18 DIAGNOSIS — I4891 Unspecified atrial fibrillation: Secondary | ICD-10-CM | POA: Insufficient documentation

## 2023-02-18 DIAGNOSIS — Z7901 Long term (current) use of anticoagulants: Secondary | ICD-10-CM | POA: Insufficient documentation

## 2023-02-18 DIAGNOSIS — Z79899 Other long term (current) drug therapy: Secondary | ICD-10-CM | POA: Diagnosis not present

## 2023-02-18 DIAGNOSIS — Z950 Presence of cardiac pacemaker: Secondary | ICD-10-CM | POA: Diagnosis not present

## 2023-02-18 DIAGNOSIS — I110 Hypertensive heart disease with heart failure: Secondary | ICD-10-CM | POA: Insufficient documentation

## 2023-02-18 MED ORDER — EMPAGLIFLOZIN 10 MG PO TABS: 10.0000 mg | ORAL_TABLET | Freq: Every day | ORAL | 1 refills | Status: DC

## 2023-02-18 NOTE — Patient Instructions (Signed)
Call us in the future if you need us for anything 

## 2023-02-18 NOTE — Progress Notes (Signed)
Patient ID: [name], female    DOB: 1925-06-23, 89 y.o.   MRN: [id number]  HPI  [name] is a 89 year old female with a history of afib, pacemaker, and chronic heart failure.   Echo report from 02/26/22 reviewed and showed an EF of 60-65% along with mild LVH, moderately elevated PA pressure, moderate MR, moderate/severe TR, mild/moderate AS and severe LAE.   Was in the ED on 10/09/22 due to a fall.   She presents today for a HF follow-up visit with a chief complaint of minimal fatigue with moderate exertion. Chronic in nature. Has associated occasional pedal edema/ SOB along with this. Denies any cough, SOB, chest pain, palpitations, abdominal distention, dizziness or difficulty sleeping.   Returns today because she needs jardiance refilled.   Does not add salt to her food. The center she goes to, serves her low sodium foods.   Past Medical History:  Diagnosis Date   Arrhythmia    atrial fibrillation   Congestive heart failure (CHF) ([place])    Hypertension    Pacemaker 2015   Past Surgical History:  Procedure Laterality Date   ABDOMINAL HYSTERECTOMY     EYE SURGERY     MOHS SURGERY  2023   PACEMAKER PLACEMENT N/A 2007   2017   Family History  Problem Relation Age of Onset   Heart failure Mother    Heart attack Father    Heart attack Daughter    Social History   Tobacco Use   Smoking status: Never   Smokeless tobacco: Never  Substance Use Topics   Alcohol use: Not Currently   Allergies  Allergen Reactions   Moxifloxacin Itching and Swelling    Swelling and itching of the eyes.   Tobramycin Itching and Swelling    Swelling and itching of the eyelids.   Codeine     Other reaction(s): Hallucination   Prior to Admission medications   Medication Sig Start Date End Date Taking? Authorizing Provider  amLODipine (NORVASC) 5 MG tablet TAKE 1 TABLET BY MOUTH EVERY DAY 12/31/22  Yes Gwyneth Sprout, FNP  atorvastatin (LIPITOR) 10 MG tablet Take 1 tablet (10 mg total)  by mouth daily. 10/15/22  Yes Bacigalupo, Dionne Bucy, MD  calcium carbonate (OS-CAL - DOSED IN MG OF ELEMENTAL CALCIUM) 1250 (500 Ca) MG tablet Take 1 tablet by mouth 2 (two) times daily with a meal.   Yes [provider]  dipyridamole-aspirin (AGGRENOX) 200-25 MG 12hr capsule TAKE 1 CAPSULE BY MOUTH TWICE A DAY 12/11/22  Yes Gwyneth Sprout, FNP  loratadine (CLARITIN) 10 MG tablet Take 10 mg by mouth daily.   Yes [provider]  losartan (COZAAR) 50 MG tablet TAKE 1 TABLET BY MOUTH TWICE A DAY 12/31/22  Yes Tally Joe T, FNP  metoprolol (TOPROL-XL) 200 MG 24 hr tablet TAKE 1 TABLET BY MOUTH EVERY DAY 12/31/22  Yes Gwyneth Sprout, FNP  Multiple Vitamins-Minerals (PRESERVISION AREDS 2 PO) Take 1 tablet by mouth in the morning and at bedtime.   Yes [provider]  multivitamin-iron-minerals-folic acid (CENTRUM) chewable tablet Chew 1 tablet by mouth daily.   Yes [provider]  Omega-3 Fatty Acids (FISH OIL) 1000 MG CAPS Take 1 capsule by mouth daily.   Yes [provider]  potassium chloride (KLOR-CON) 10 MEQ tablet Take 10 mEq by mouth as needed. Only if she takes an additional dose of lasix   Yes [provider]  torsemide (DEMADEX) 20 MG tablet TAKE 2  TABLETS (40 MG TOTAL) BY MOUTH DAILY. AND ADDITIONAL '20MG'$  IF NEEDED 07/13/22  Yes Darylene Price A, FNP  empagliflozin (JARDIANCE) 10 MG TABS tablet Take 1 tablet (10 mg total) by mouth daily. 02/18/23   Alisa Graff, FNP    Review of Systems  Constitutional:  Positive for fatigue. Negative for appetite change.  HENT:  Negative for congestion, rhinorrhea and sore throat.   Eyes: Negative.   Respiratory:  Negative for cough, chest tightness and shortness of breath.   Cardiovascular:  Positive for leg swelling. Negative for chest pain and palpitations.  Gastrointestinal:  Negative for abdominal distention and abdominal pain.  Endocrine: Negative.   Genitourinary: Negative.  Negative for dysuria and  urgency.  Musculoskeletal:  Negative for back pain and neck pain.  Skin: Negative.   Allergic/Immunologic: Negative.   Neurological:  Negative for dizziness and light-headedness.  Hematological:  Negative for adenopathy. Does not bruise/bleed easily.  Psychiatric/Behavioral:  Negative for dysphoric mood and sleep disturbance (sleeping on 2 pillows). The patient is not nervous/anxious.    Vitals:   02/18/23 1527  BP: (!) 121/59  Pulse: 81  SpO2: 98%  Weight: 113 lb 8 oz (51.5 kg)   Wt Readings from Last 3 Encounters:  02/18/23 113 lb 8 oz (51.5 kg)  09/26/22 111 lb (50.3 kg)  06/10/22 107 lb 6 oz (48.7 kg)   Lab Results  Component Value Date   CREATININE 0.97 09/26/2022   CREATININE 0.97 04/08/2022   CREATININE 0.88 03/07/2022   Physical Exam Vitals and nursing note reviewed. Exam conducted with a chaperone present (daughter).  Constitutional:      Appearance: Normal appearance.  HENT:     Head: Normocephalic and atraumatic.  Cardiovascular:     Rate and Rhythm: Normal rate and regular rhythm.  Pulmonary:     Effort: Pulmonary effort is normal. No respiratory distress.     Breath sounds: No wheezing or rales.  Abdominal:     General: There is no distension.     Palpations: Abdomen is soft.     Tenderness: There is no abdominal tenderness.  Musculoskeletal:        General: No tenderness.     Cervical back: Normal range of motion and neck supple.     Right lower leg: Edema (trace pitting) present.     Left lower leg: Edema (trace pitting) present.  Skin:    General: Skin is warm and dry.  Neurological:     General: No focal deficit present.     Mental Status: She is alert and oriented to person, place, and time.  Psychiatric:        Mood and Affect: Mood normal.        Behavior: Behavior normal.        Thought Content: Thought content normal.     Assessment & Plan:  Chronic heart failure with preserved ejection fraction with structural changes (LAE)- - NYHA  II - euvolemic today - weighing daily; reminded to call for an overnight weight gain of > 2 pounds or a weekly weight gain of > 5 pounds - weight up 6 pounds from last visit here 6 months ago - goes to an adult day care each day, reports that she eats low sodium diet there - trying to keep daily fluid intake to 60-64 ounces daily - jardiance '10mg'$  daily; refilled today - losartan '50mg'$  BID - metoprolol succinate '200mg'$  daily - torsemide '40mg'$  daily and extra '20mg'$  PRN - potassium 26mq if takes extra  torsemide - BNP 648 on 02/03/22  Atrial fibrillation - RRR today, has pacemaker - saw cardiology, Dr. Garen Lah 03/22/22 - saw EP Quentin Ore) 05/22/22 - BMP 09/26/22 reviewed and showed sodium 136, potassium 4.4, creatinine 0.97 & GFR 54  3: Lymphedema- - stage 2 - wears compression socks daily - is quite active and walked into the office - goes to an adult daycare daily - saw wound center Joaquim Lai) 05/27/22 - saw PCP Rollene Rotunda) 09/16/22   Medication list reviewed. Jardiance refilled today. Explained to patient and her daughter that I would need to continue seeing her for refills to be provided or her PCP could do them. I will reach out to her PCP and see if she would be willing to refill jardiance.   Return here as needed, patient and daughter were comfortable with this plan.

## 2023-02-26 ENCOUNTER — Ambulatory Visit (INDEPENDENT_AMBULATORY_CARE_PROVIDER_SITE_OTHER): Payer: Medicare PPO

## 2023-02-26 DIAGNOSIS — I5043 Acute on chronic combined systolic (congestive) and diastolic (congestive) heart failure: Secondary | ICD-10-CM | POA: Diagnosis not present

## 2023-03-03 LAB — CUP PACEART REMOTE DEVICE CHECK
Battery Impedance: 2442 Ohm
Battery Remaining Longevity: 30 mo
Battery Voltage: 2.75 V
Brady Statistic RV Percent Paced: 94 %
Date Time Interrogation Session: 20240323102605
Implantable Lead Connection Status: 753985
Implantable Lead Connection Status: 753985
Implantable Lead Implant Date: 20070523
Implantable Lead Implant Date: 20070523
Implantable Lead Location: 753862
Implantable Lead Location: 753862
Implantable Lead Model: 4092
Implantable Lead Model: 5076
Implantable Pulse Generator Implant Date: 20151028
Lead Channel Impedance Value: 67 Ohm
Lead Channel Impedance Value: 671 Ohm
Lead Channel Pacing Threshold Amplitude: 0.625 V
Lead Channel Pacing Threshold Pulse Width: 0.4 ms
Lead Channel Setting Pacing Amplitude: 2 V
Lead Channel Setting Pacing Pulse Width: 0.4 ms
Lead Channel Setting Sensing Sensitivity: 2.8 mV
Zone Setting Status: 755011
Zone Setting Status: 755011

## 2023-03-30 ENCOUNTER — Other Ambulatory Visit: Payer: Self-pay | Admitting: Family Medicine

## 2023-04-01 ENCOUNTER — Telehealth: Payer: Self-pay

## 2023-04-01 NOTE — Telephone Encounter (Signed)
Attempting to reach patient to get her set up for a physical.

## 2023-04-02 ENCOUNTER — Telehealth: Payer: Self-pay

## 2023-04-02 ENCOUNTER — Other Ambulatory Visit: Payer: Self-pay | Admitting: Family Medicine

## 2023-04-02 DIAGNOSIS — Z0279 Encounter for issue of other medical certificate: Secondary | ICD-10-CM

## 2023-04-02 NOTE — Telephone Encounter (Signed)
Patient's daughter, Cyndie Woodbeck was informed of the completion of forms.

## 2023-04-03 ENCOUNTER — Telehealth: Payer: Self-pay | Admitting: Family Medicine

## 2023-04-03 ENCOUNTER — Telehealth: Payer: Self-pay

## 2023-04-03 NOTE — Telephone Encounter (Signed)
Applicant Medical Information with medication list will be faxed to [facility].

## 2023-04-03 NOTE — Telephone Encounter (Signed)
[name] states that the fax she received back for the pt is incomplete. The applicant medical information sheet she only received the front page and not the back page with the PCP signature and she is needing this.  The medication sheet that was sent back to her was supposed to be filled out by the family and signed by the family, not the doctor. Kylie states that she normally get paper work attached with the the fax which includes a list of medications and are prescribed and dated. Kylie states that she only received 5 of the 6 documents back and two letters that she included.    Kylie is needing the paper work to be sent back to her today please. Phone: [phone] Fax: [phone]

## 2023-04-03 NOTE — Progress Notes (Signed)
Remote pacemaker transmission.   

## 2023-04-03 NOTE — Telephone Encounter (Signed)
Copied from CRM [id number]. Topic: General - Other >> Apr 03, 2023 11:23 AM Everette C wrote: Reason for CRM: The patient's daughter has called for an update on the completion of previously requested paperwork for the patient's admission into a Day Center as well as their request for a current med list of the patient   Please contact the patient's daughter further when possible

## 2023-04-03 NOTE — Telephone Encounter (Signed)
Proper paper work has been faxed to [facility] along with medication list signed by PCP.

## 2023-04-07 ENCOUNTER — Other Ambulatory Visit: Payer: Self-pay | Admitting: Family Medicine

## 2023-04-08 ENCOUNTER — Emergency Department
Admission: EM | Admit: 2023-04-08 | Discharge: 2023-04-08 | Disposition: A | Discharge status: Home / Self Care | Payer: Medicare PPO | Attending: Emergency Medicine | Admitting: Emergency Medicine

## 2023-04-08 ENCOUNTER — Emergency Department: Payer: Medicare PPO

## 2023-04-08 ENCOUNTER — Other Ambulatory Visit: Payer: Self-pay

## 2023-04-08 DIAGNOSIS — Z043 Encounter for examination and observation following other accident: Secondary | ICD-10-CM | POA: Diagnosis not present

## 2023-04-08 DIAGNOSIS — Z950 Presence of cardiac pacemaker: Secondary | ICD-10-CM | POA: Diagnosis not present

## 2023-04-08 DIAGNOSIS — W19XXXA Unspecified fall, initial encounter: Secondary | ICD-10-CM

## 2023-04-08 DIAGNOSIS — S0083XA Contusion of other part of head, initial encounter: Secondary | ICD-10-CM

## 2023-04-08 DIAGNOSIS — R58 Hemorrhage, not elsewhere classified: Secondary | ICD-10-CM | POA: Diagnosis not present

## 2023-04-08 DIAGNOSIS — I110 Hypertensive heart disease with heart failure: Secondary | ICD-10-CM | POA: Diagnosis not present

## 2023-04-08 DIAGNOSIS — I480 Paroxysmal atrial fibrillation: Secondary | ICD-10-CM | POA: Diagnosis not present

## 2023-04-08 DIAGNOSIS — S81812A Laceration without foreign body, left lower leg, initial encounter: Secondary | ICD-10-CM | POA: Diagnosis not present

## 2023-04-08 DIAGNOSIS — I509 Heart failure, unspecified: Secondary | ICD-10-CM | POA: Diagnosis not present

## 2023-04-08 DIAGNOSIS — S0093XA Contusion of unspecified part of head, initial encounter: Secondary | ICD-10-CM

## 2023-04-08 DIAGNOSIS — S99922A Unspecified injury of left foot, initial encounter: Secondary | ICD-10-CM | POA: Diagnosis not present

## 2023-04-08 DIAGNOSIS — S8012XA Contusion of left lower leg, initial encounter: Secondary | ICD-10-CM | POA: Diagnosis not present

## 2023-04-08 LAB — CBC WITH DIFFERENTIAL/PLATELET
Abs Immature Granulocytes: 0.01 10*3/uL (ref 0.00–0.07)
Basophils Absolute: 0 10*3/uL (ref 0.0–0.1)
Basophils Relative: 0 %
Eosinophils Absolute: 0.1 10*3/uL (ref 0.0–0.5)
Eosinophils Relative: 2 %
HCT: 37.1 % (ref 36.0–46.0)
Hemoglobin: 12 g/dL (ref 12.0–15.0)
Immature Granulocytes: 0 %
Lymphocytes Relative: 18 %
Lymphs Abs: 1 10*3/uL (ref 0.7–4.0)
MCH: 33.1 pg (ref 26.0–34.0)
MCHC: 32.3 g/dL (ref 30.0–36.0)
MCV: 102.2 fL — ABNORMAL HIGH (ref 80.0–100.0)
Monocytes Absolute: 0.5 10*3/uL (ref 0.1–1.0)
Monocytes Relative: 9 %
Neutro Abs: 3.8 10*3/uL (ref 1.7–7.7)
Neutrophils Relative %: 71 %
Platelets: 157 10*3/uL (ref 150–400)
RBC: 3.63 MIL/uL — ABNORMAL LOW (ref 3.87–5.11)
RDW: 14 % (ref 11.5–15.5)
WBC: 5.3 10*3/uL (ref 4.0–10.5)
nRBC: 0 % (ref 0.0–0.2)

## 2023-04-08 LAB — COMPREHENSIVE METABOLIC PANEL
ALT: 20 U/L (ref 0–44)
AST: 34 U/L (ref 15–41)
Albumin: 3.5 g/dL (ref 3.5–5.0)
Alkaline Phosphatase: 51 U/L (ref 38–126)
Anion gap: 9 (ref 5–15)
BUN: 41 mg/dL — ABNORMAL HIGH (ref 8–23)
CO2: 28 mmol/L (ref 22–32)
Calcium: 9.1 mg/dL (ref 8.9–10.3)
Chloride: 99 mmol/L (ref 98–111)
Creatinine, Ser: 0.87 mg/dL (ref 0.44–1.00)
GFR, Estimated: >60 mL/min (ref >60)
Glucose, Bld: 92 mg/dL (ref 70–99)
Potassium: 4.3 mmol/L (ref 3.5–5.1)
Sodium: 136 mmol/L (ref 135–145)
Total Bilirubin: 1.1 mg/dL (ref 0.3–1.2)
Total Protein: 6.7 g/dL (ref 6.5–8.1)

## 2023-04-08 LAB — URINALYSIS, ROUTINE W REFLEX MICROSCOPIC
Bacteria, UA: NONE SEEN
Bilirubin Urine: NEGATIVE
Glucose, UA: 50 mg/dL — AB
Hgb urine dipstick: NEGATIVE
Ketones, ur: NEGATIVE mg/dL
Nitrite: NEGATIVE
Protein, ur: NEGATIVE mg/dL
Specific Gravity, Urine: 1.004 — ABNORMAL LOW (ref 1.005–1.030)
Squamous Epithelial / HPF: NONE SEEN /HPF (ref 0–5)
pH: 7 (ref 5.0–8.0)

## 2023-04-08 NOTE — ED Provider Notes (Addendum)
[facility] Provider Note    Event Date/Time   First MD Initiated Contact with Patient 04/08/23 1231     (approximate)   History   Fall   HPI  [name] is a 89 y.o. female   presents to the ED via EMS after a fall that occurred in which patient states that she was using her walker and fell.  Patient has bruising to the left side of her face but denies LOC.  Patient states she is taking blood thinners.  She also complains of an injury to her left foot and has a skin tear to the left lower extremity.  Patient has a history of hypertension, CHF, paroxysmal atrial fibrillation and pacemaker.      Physical Exam   Triage Vital Signs: ED Triage Vitals  Enc Vitals Group     BP 04/08/23 1107 106/60     Pulse Rate 04/08/23 1107 64     Resp 04/08/23 1107 18     Temp 04/08/23 1107 98 F (36.7 C)     Temp src --      SpO2 04/08/23 1107 95 %     Weight --      Height --      Head Circumference --      Peak Flow --      Pain Score 04/08/23 1105 5     Pain Loc --      Pain Edu? --      Excl. in GC? --     Most recent vital signs: Vitals:   04/08/23 1107 04/08/23 1435  BP: 106/60 110/63  Pulse: 64 68  Resp: 18 16  Temp: 98 F (36.7 C)   SpO2: 95% 96%     General: Awake, no distress.  Alert, talkative, answers questions appropriately. CV:  Good peripheral perfusion.  Heart regular rate and rhythm. Resp:  Normal effort.  Lungs lungs are clear bilaterally. Abd:  No distention.  Soft, nontender.  Bowel sounds present x 4 quadrants.  No tenderness on compression of the hips bilaterally. Other:  Soft tissue edema with some mild ecchymosis noted to the left side of the face and infraorbital area.  Skin is intact.  PERRLA, EOMI's, cranial nerves II through XII grossly intact.  Speech is normal.  Nontender cervical spine to palpation.  Patient is able move upper extremities without any difficulty or restriction.  Nontender palpation of the ribs  bilaterally.  Patient is able to move lower extremities without any difficulty.  There is no shortening or rotation of the lower extremities.  There is a skin tear present to the left lateral lower extremity without active bleeding.  Pulses are present DP bilaterally.   ED Results / Procedures / Treatments   Labs (all labs ordered are listed, but only abnormal results are displayed) Labs Reviewed  CBC WITH DIFFERENTIAL/PLATELET - Abnormal; Notable for the following components:      Result Value   RBC 3.63 (*)    MCV 102.2 (*)    All other components within normal limits  URINALYSIS, ROUTINE W REFLEX MICROSCOPIC - Abnormal; Notable for the following components:   Color, Urine STRAW (*)    APPearance CLEAR (*)    Specific Gravity, Urine 1.004 (*)    Glucose, UA 50 (*)    Leukocytes,Ua TRACE (*)    All other components within normal limits  COMPREHENSIVE METABOLIC PANEL - Abnormal; Notable for the following components:   BUN 41 (*)  All other components within normal limits      RADIOLOGY CT head, cervical spine and maxillofacial without contrast per radiologist was negative for any acute intracranial changes, fractures, or cervical malalignment.  X-ray images of the left foot were reviewed and interpreted by myself independent of the radiologist and was negative for acute fracture.  PROCEDURES:  Critical Care performed:   Procedures   MEDICATIONS ORDERED IN ED: Medications - No data to display   IMPRESSION / MDM / ASSESSMENT AND PLAN / ED COURSE  I reviewed the triage vital signs and the nursing notes.   Differential diagnosis includes, but is not limited to, head injury, skull fracture, facial fracture, contusion, cervical fracture, skin tear, laceration, foot/ankle fracture.  89 year old female presents to the ED via EMS after a fall that occurred earlier today with an injury to the left side of her head and face.  Patient denies any LOC, visual changes, dizziness  or headache.  Daughter is present and patient is at her baseline.  CT scans and x-ray of left foot were reassuring and patient and daughter were made aware.  The skin tear to her left lower extremity was cleaned with normal saline and a hydrocolloid wound dressing was applied.  Care instructions were given to the daughter.  Lab work is reassuring, BUN was elevated at 41 however daughter states that for the last 2 days patient has had to increase her Lasix due to some weight gain that is being monitored closely.  Patient is to follow-up with her PCP if any continued problems and return to the emergency department if any urgent concerns.      Patient's presentation is most consistent with acute illness / injury with system symptoms.  FINAL CLINICAL IMPRESSION(S) / ED DIAGNOSES   Final diagnoses:  Contusion of face, initial encounter  Contusion of head, initial encounter  Noninfected skin tear of left lower extremity, initial encounter  Fall, initial encounter     Rx / DC Orders   ED Discharge Orders     None        Note:  This document was prepared using Dragon voice recognition software and may include unintentional dictation errors.   Tommi Rumps, PA-C 04/08/23 1513    Tommi Rumps, PA-C 04/08/23 1514    Phineas Semen, MD 04/08/23 [phone]

## 2023-04-08 NOTE — ED Triage Notes (Signed)
First RN note-  Pt BIB [facility]. Pt is from adult day care. Pt fell and bumped her hair. Pt is on blood thinner. 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

## 2023-04-08 NOTE — Discharge Instructions (Signed)
Follow-up with your primary care provider if any continued problems.  CT scan of your face, head and neck were negative for any acute bony injury.  You can expect to have bruising and swelling of the left side of your face due to your injury.  Continue with your regular medication as prescribed by your doctor.  The dressing that was applied to your leg should be removed in approximately 10 days which is a hydrocolloid wound dressing for skin tears.  If you have any swelling of your legs you should elevate your legs especially at night.  Turn to the emergency department if any urgent concerns.

## 2023-04-08 NOTE — ED Triage Notes (Signed)
Pt comes via EMS with c/o fall. Pt has bruising noted to left side of head/eye. Pt is on thinners. Pt also injured left foot. Pt states she was using her roller and fell.

## 2023-04-16 ENCOUNTER — Ambulatory Visit: Payer: Self-pay | Admitting: *Deleted

## 2023-04-16 NOTE — Telephone Encounter (Signed)
  Chief Complaint: Wound Drainage Symptoms: Daughter calling, on DPR. States pt has skin tear, with flap, 2 in.left shin. Sustained 04/08/23. Hydrocolloid applied, told to remove in 10 days. Concerned as "Looks grey and wet under hydrocolloid." Mild swelling, tender to touch Frequency: 4.30.24 Pertinent Negatives: Patient denies fever Disposition: [] ED /[] Urgent Care (no appt availability in office) / [x] Appointment(In office/virtual)/ []  [facility]/ [] Home Care/ [] Refused Recommended Disposition /[] [facility]/ []  Follow-up with PCP Additional Notes: Pt has appt tomorrow at 3:40. Can pt be seen sooner? Very anxious regarding possible infection.Assured drainage under a hydrocolloid can look grey and "Soupy." Advised ED for worsening symptoms, fever. Please advise if pt can have earlier appt.  After hours call.  Reason for Disposition  [1] Wound > 48 hours old AND [2] becoming more painful or tender to touch  Answer Assessment - Initial Assessment Questions 1. LOCATION: "Where is the wound located?"      Left shin 2. WOUND APPEARANCE: "What does the wound look like?"      "Grey" Little red starting around it 3. SIZE: If redness is present, ask: "What is the size of the red area?" (Inches, centimeters, or compare to size of a coin)      Slight 4. SPREAD: "What's changed in the last day?"  "Do you see any red streaks coming from the wound?"     Unsure 5. ONSET: "When did it start to look infected?"      Noted today 6. MECHANISM: "How did the wound start, what was the cause?"     Skin tear 2 in. 7. PAIN: Do you have any pain?"  If Yes, ask: "How bad is the pain?"  (e.g., Scale 1-10; mild, moderate, or severe)    - MILD (1-3): Doesn't interfere with normal activities.     - MODERATE (4-7): Interferes with normal activities or awakens from sleep.    - SEVERE (8-10): Excruciating pain, unable to do any normal activities.       Tender to touch 8. FEVER: "Do you have a  fever?" If Yes, ask: "What is your temperature, how was it measured, and when did it start?"     No 9. OTHER SYMPTOMS: "Do you have any other symptoms?" (e.g., shaking chills, weakness, rash elsewhere on body)     Mild swelling  Protocols used: Wound Infection Suspected-A-AH

## 2023-04-17 ENCOUNTER — Encounter: Payer: Self-pay | Admitting: Family Medicine

## 2023-04-17 ENCOUNTER — Ambulatory Visit: Payer: Medicare PPO | Admitting: Family Medicine

## 2023-04-17 ENCOUNTER — Inpatient Hospital Stay: Payer: Medicare PPO | Admitting: Family Medicine

## 2023-04-17 VITALS — BP 111/71 | HR 70 | Temp 97.5°F | Resp 14 | Ht 59.0 in | Wt 115.2 lb

## 2023-04-17 DIAGNOSIS — S81812A Laceration without foreign body, left lower leg, initial encounter: Secondary | ICD-10-CM | POA: Diagnosis not present

## 2023-04-17 DIAGNOSIS — S81812S Laceration without foreign body, left lower leg, sequela: Secondary | ICD-10-CM | POA: Diagnosis not present

## 2023-04-17 DIAGNOSIS — W19XXXA Unspecified fall, initial encounter: Secondary | ICD-10-CM | POA: Diagnosis not present

## 2023-04-17 MED ORDER — DOXYCYCLINE HYCLATE 100 MG PO TABS
100.0000 mg | ORAL_TABLET | Freq: Two times a day (BID) | ORAL | 0 refills | Status: DC
Start: 2023-04-17 — End: 2023-05-16

## 2023-04-17 NOTE — Assessment & Plan Note (Signed)
4/30 initial skin tear to L shin; steristrips and hydrocolloid and guaze removed; dressed with telfa and gauze with coban s/p NS wash and pat dry. Edema remains; as well as skin tender/warm to touch- start abx 1 wk f/u with doxy- continue dressings every 2-3 days; clean with gentle circular motion with iodine, dressing with telfa, loose coban to secure.

## 2023-04-17 NOTE — Progress Notes (Signed)
I,Vanessa  Vital,acting as a Neurosurgeon for Kylie Kindle, FNP.,have documented all relevant documentation on the behalf of Kylie Kindle, FNP,as directed by  Kylie Kindle, FNP while in the presence of Kylie Kindle, FNP.    Established patient visit   Patient: [name]   DOB: 05-09-1925   89 y.o. Female  MRN: [id number] Visit Date: 04/17/2023  Today's healthcare provider: [name], FNP   Re Introduced to nurse practitioner role and practice setting.  All questions answered.  Discussed provider/patient relationship and expectations.  Subjective    HPI Patient is here for ER follow up visit after a fall, that cause injury to her left leg. HPI   Patient reports swelling and burning sensation on injured leg. States she has good and bad days. Last edited by Lubertha Basque, CMA on 04/17/2023  3:54 PM.      Follow up ER visit  Patient was seen in ER for a fall, skin tear, contusion on face and head on 04/08/2023. She was treated for injury to left leg. Treatment for this included Hydrocolloid placed on injury. She reports fair compliance with treatment. She reports this condition is  unchanged .  -----------------------------------------------------------------------------------------   Medications: Outpatient Medications Prior to Visit  Medication Sig   amLODipine (NORVASC) 5 MG tablet TAKE 1 TABLET BY MOUTH EVERY DAY   atorvastatin (LIPITOR) 10 MG tablet Take 1 tablet (10 mg total) by mouth daily. Please schedule office visit before any future refill   calcium carbonate (OS-CAL - DOSED IN MG OF ELEMENTAL CALCIUM) 1250 (500 Ca) MG tablet Take 1 tablet by mouth 2 (two) times daily with a meal.   dipyridamole-aspirin (AGGRENOX) 200-25 MG 12hr capsule TAKE 1 CAPSULE BY MOUTH TWICE A DAY   empagliflozin (JARDIANCE) 10 MG TABS tablet Take 1 tablet (10 mg total) by mouth daily.   loratadine (CLARITIN) 10 MG tablet Take 10 mg by mouth daily.   losartan (COZAAR) 50 MG tablet TAKE 1  TABLET BY MOUTH TWICE A DAY   metoprolol (TOPROL-XL) 200 MG 24 hr tablet TAKE 1 TABLET BY MOUTH EVERY DAY   Multiple Vitamins-Minerals (PRESERVISION AREDS 2 PO) Take 1 tablet by mouth in the morning and at bedtime.   multivitamin-iron-minerals-folic acid (CENTRUM) chewable tablet Chew 1 tablet by mouth daily.   Omega-3 Fatty Acids (FISH OIL) 1000 MG CAPS Take 1 capsule by mouth daily.   potassium chloride (KLOR-CON) 10 MEQ tablet Take 10 mEq by mouth as needed. Only if she takes an additional dose of lasix   torsemide (DEMADEX) 20 MG tablet TAKE 2 TABLETS (40 MG TOTAL) BY MOUTH DAILY. AND ADDITIONAL 20MG  IF NEEDED (Patient taking differently: Take 20 mg by mouth once.)   No facility-administered medications prior to visit.    Review of Systems  All other systems reviewed and are negative.     Objective    BP 111/71 (BP Location: Left Arm, Patient Position: Sitting, Cuff Size: Small)   Pulse 70   Temp (!) 97.5 F (36.4 C) (Oral)   Resp 14   Ht 4\' 11"  (1.499 m)   Wt 115 lb 3.2 oz (52.3 kg)   LMP  (LMP Unknown)   SpO2 100%   BMI 23.27 kg/m   Physical Exam Vitals and nursing note reviewed.  Constitutional:      General: She is not in acute distress.    Appearance: Normal appearance. She is normal weight. She is not ill-appearing, toxic-appearing or diaphoretic.  HENT:  Head:     Comments: Healing abrasion/bruise to L cheek Cardiovascular:     Rate and Rhythm: Normal rate and regular rhythm.     Pulses: Normal pulses.     Heart sounds: Normal heart sounds. No murmur heard.    No friction rub. No gallop.  Pulmonary:     Effort: Pulmonary effort is normal. No respiratory distress.     Breath sounds: Normal breath sounds. No stridor. No wheezing, rhonchi or rales.  Chest:     Chest wall: No tenderness.  Musculoskeletal:        General: No swelling, tenderness, deformity or signs of injury. Normal range of motion.     Right lower leg: No edema.     Left lower leg: No  edema.  Skin:    General: Skin is warm and dry.     Capillary Refill: Capillary refill takes less than 2 seconds.     Coloration: Skin is not jaundiced or pale.     Findings: Erythema and lesion present. No bruising or rash.          Comments: 4/30 initial skin tear to L shin; steristrips and hydrocolloid and guaze removed; dressed with telfa and gauze with coban s/p NS wash and pat dry. Edema remains; as well as skin tender/warm to touch- start abx  Neurological:     General: No focal deficit present.     Mental Status: She is alert and oriented to person, place, and time. Mental status is at baseline.     Cranial Nerves: No cranial nerve deficit.     Sensory: No sensory deficit.     Motor: Weakness present.     Coordination: Coordination normal.     Gait: Gait abnormal.     Comments: At baseline; vision impairment interferes with gait- chronic use of walker   Psychiatric:        Mood and Affect: Mood normal.        Behavior: Behavior normal.        Thought Content: Thought content normal.        Judgment: Judgment normal.    No results found for any visits on 04/17/23.  Assessment & Plan     Problem List Items Addressed This Visit       Musculoskeletal and Integument   Skin tear of left lower leg without complication - Primary    4/30 initial skin tear to L shin; steristrips and hydrocolloid and guaze removed; dressed with telfa and gauze with coban s/p NS wash and pat dry. Edema remains; as well as skin tender/warm to touch- start abx 1 wk f/u with doxy- continue dressings every 2-3 days; clean with gentle circular motion with iodine, dressing with telfa, loose coban to secure.      Decline repeat BMP at this time; encouraged to follow up with CHF clinic regarding increasing need for additional diuresis.   Return in about 1 week (around 04/24/2023), or if symptoms worsen or fail to improve.     Leilani Merl, FNP, have reviewed all documentation for this visit. The  documentation on 04/17/23 for the exam, diagnosis, procedures, and orders are all accurate and complete.  Kylie Kindle, FNP  [facility] [phone] (phone) [phone] (fax)  [facility]

## 2023-04-29 ENCOUNTER — Encounter: Payer: Medicare PPO | Attending: Physician Assistant | Admitting: Physician Assistant

## 2023-04-29 DIAGNOSIS — I480 Paroxysmal atrial fibrillation: Secondary | ICD-10-CM | POA: Diagnosis not present

## 2023-04-29 DIAGNOSIS — L97822 Non-pressure chronic ulcer of other part of left lower leg with fat layer exposed: Secondary | ICD-10-CM | POA: Insufficient documentation

## 2023-04-29 DIAGNOSIS — S81812A Laceration without foreign body, left lower leg, initial encounter: Secondary | ICD-10-CM | POA: Diagnosis not present

## 2023-04-29 DIAGNOSIS — I5042 Chronic combined systolic (congestive) and diastolic (congestive) heart failure: Secondary | ICD-10-CM | POA: Diagnosis not present

## 2023-04-29 DIAGNOSIS — W19XXXA Unspecified fall, initial encounter: Secondary | ICD-10-CM | POA: Insufficient documentation

## 2023-04-29 DIAGNOSIS — I110 Hypertensive heart disease with heart failure: Secondary | ICD-10-CM | POA: Diagnosis not present

## 2023-04-29 DIAGNOSIS — Z7901 Long term (current) use of anticoagulants: Secondary | ICD-10-CM | POA: Insufficient documentation

## 2023-04-29 DIAGNOSIS — S8012XA Contusion of left lower leg, initial encounter: Secondary | ICD-10-CM | POA: Insufficient documentation

## 2023-04-29 NOTE — Progress Notes (Signed)
[place], [name] ([id number]) [phone] [id number] Page 1 of 4 Visit Report for 04/29/2023 Abuse Risk Screen Details Patient Name: Date of Service: [name], [name] 04/29/2023 9:30 A M Medical Record Number: [id number] Patient Account Number: [id number] Date of Birth/Sex: Treating RN: [birth year] (89 y.o. Freddy Finner Primary Care Aesha Agrawal: Merita Norton Other Clinician: Referring Ermie Glendenning: Treating Taneka Espiritu/Extender: Allen Derry Self, Referral Weeks in Treatment: 0 Abuse Risk Screen Items Answer ABUSE RISK SCREEN: Has anyone close to you tried to hurt or harm you recentlyo No Do you feel uncomfortable with anyone in your familyo No Has anyone forced you do things that you didnt want to doo No Electronic Signature(s) Signed: 04/29/2023 11:25:09 AM By: Yevonne Pax RN Entered By: Yevonne Pax on 04/29/2023 09:54:41 -------------------------------------------------------------------------------- Activities of Daily Living Details Patient Name: Date of Service: [name], [name] 04/29/2023 9:30 A M Medical Record Number: [id number] Patient Account Number: [id number] Date of Birth/Sex: Treating RN: Oct 26, 1925 (89 y.o. Freddy Finner Primary Care Decklin Weddington: Merita Norton Other Clinician: Referring Lupita Rosales: Treating Kaziyah Parkison/Extender: Allen Derry Self, Referral Weeks in Treatment: 0 Activities of Daily Living Items Answer Activities of Daily Living (Please select one for each item) Drive Automobile Not Able T Medications ake Need Assistance Use T elephone Need Assistance Care for Appearance Completely Able Use T oilet Completely Able Bath / Shower Completely Able Dress Self Completely Able Feed Self Completely Able Walk Completely Able Get In / Out Bed Completely Able Housework Not Able Prepare Meals Not Able Handle Money Not Able Shop for Self Not Able Electronic Signature(s) Signed: 04/29/2023 11:25:09 AM By: Yevonne Pax RN Entered By: Yevonne Pax on  04/29/2023 09:55:37 -------------------------------------------------------------------------------- Education Screening Details Patient Name: Date of Service: [name], [name] 04/29/2023 9:30 A M Medical Record Number: [id number] Patient Account Number: [id number] Date of Birth/Sex: Treating RN: January 05, 1925 (89 y.o. Freddy Finner Primary Care Roxana Lai: Merita Norton Other Clinician: Referring Amillion Scobee: Treating Azlan Hanway/Extender: Allen Derry Self, Referral Weeks in TreatmentIzora Gala [place], [name] ([id number]) [id number] Page 2 of 4 Primary Learner Assessed: Patient Learning Preferences/Education Level/Primary Language Learning Preference: Explanation Highest Education Level: College or Above Preferred Language: English Cognitive Barrier Language Barrier: No Translator Needed: No Memory Deficit: No Emotional Barrier: No Cultural/Religious Beliefs Affecting Medical Care: No Physical Barrier Impaired Vision: Yes Glasses Impaired Hearing: No Decreased Hand dexterity: No Knowledge/Comprehension Knowledge Level: Medium Comprehension Level: High Ability to understand written instructions: High Ability to understand verbal instructions: High Motivation Anxiety Level: Anxious Cooperation: Cooperative Education Importance: Acknowledges Need Interest in Health Problems: Asks Questions Perception: Coherent Willingness to Engage in Self-Management High Activities: Readiness to Engage in Self-Management High Activities: Electronic Signature(s) Signed: 04/29/2023 11:25:09 AM By: Yevonne Pax RN Entered By: Yevonne Pax on 04/29/2023 09:57:07 -------------------------------------------------------------------------------- Fall Risk Assessment Details Patient Name: Date of Service: [name] 04/29/2023 9:30 A M Medical Record Number: [id number] Patient Account Number: [id number] Date of Birth/Sex: Treating RN: [birth year] (89 y.o. Freddy Finner Primary  Care Alyia Lacerte: Merita Norton Other Clinician: Referring Chanelle Hodsdon: Treating Dorothee Napierkowski/Extender: Allen Derry Self, Referral Weeks in Treatment: 0 Fall Risk Assessment Items Have you had 2 or more falls in the last 12 monthso 0 Yes Have you had any fall that resulted in injury in the last 12 monthso 0 No FALLS RISK SCREEN History of falling - immediate or within 3 months 25 Yes Secondary diagnosis (Do you have 2 or more medical diagnoseso) 0 No Ambulatory aid None/bed rest/wheelchair/nurse 0 No Crutches/cane/walker 15 Yes Furniture 0 No Intravenous therapy Access/Saline/Heparin Lock 0 No Gait/Transferring  Normal/ bed rest/ wheelchair 0 Yes Weak (short steps with or without shuffle, stooped but able to lift head while walking, may seek 0 No support from furniture) Impaired (short steps with shuffle, may have difficulty arising from chair, head down, impaired 0 No balance) Mental Status Oriented to own ability 0 Yes Overestimates or forgets limitations 0 No Risk Level: Medium Risk Score: 40 [name], [name] ([id number]) [id number] Page 3 of 4 Electronic Signature(s) -------------------------------------------------------------------------------- Foot Assessment Details Patient Name: Date of Service: [name], [name] 04/29/2023 9:30 A M Medical Record Number: [id number] Patient Account Number: [id number] Date of Birth/Sex: Treating RN: [birth year] (89 y.o. Freddy Finner Primary Care Lanise Mergen: Merita Norton Other Clinician: Referring Saphronia Ozdemir: Treating Norely Schlick/Extender: Allen Derry Self, Referral Weeks in Treatment: 0 Foot Assessment Items Site Locations + = Sensation present, - = Sensation absent, C = Callus, U = Ulcer R = Redness, W = Warmth, M = Maceration, PU = Pre-ulcerative lesion F = Fissure, S = Swelling, D = Dryness Assessment Right: Left: Other Deformity: No No Prior Foot Ulcer: No No Prior Amputation: No No Charcot Joint: No No Ambulatory  Status: Ambulatory Without Help Gait: Steady Electronic Signature(s) Signed: 04/29/2023 11:25:09 AM By: Yevonne Pax RN Entered By: Yevonne Pax on 04/29/2023 10:01:17 -------------------------------------------------------------------------------- Nutrition Risk Screening Details Patient Name: Date of Service: [name], [name] 04/29/2023 9:30 A M Medical Record Number: [id number] Patient Account Number: [id number] Date of Birth/Sex: Treating RN: 09/16/1925 (89 y.o. Freddy Finner Primary Care Kierston Plasencia: Merita Norton Other Clinician: Referring Darchelle Nunes: Treating Janiaya Ryser/Extender: Allen Derry Self, Referral Weeks in Treatment: 0 Height (in): 59 Weight (lbs): 116 Body Mass Index (BMI): 23.4 [name], [name] ([id number]) [id number] Page 4 of 4 Nutrition Risk Screening Items Score Screening NUTRITION RISK SCREEN: I have an illness or condition that made me change the kind and/or amount of food I eat 0 No I eat fewer than two meals per day 0 No I eat few fruits and vegetables, or milk products 0 No I have three or more drinks of beer, liquor or wine almost every day 0 No I have tooth or mouth problems that make it hard for me to eat 0 No I don't always have enough money to buy the food I need 0 No I eat alone most of the time 0 No I take three or more different prescribed or over-the-counter drugs a day 0 No Without wanting to, I have lost or gained 10 pounds in the last six months 0 No I am not always physically able to shop, cook and/or feed myself 0 No Nutrition Protocols Good Risk Protocol 0 No interventions needed Moderate Risk Protocol High Risk Proctocol Risk Level: Good Risk Score: 0 Electronic Signature(s) Signed: 04/29/2023 11:25:09 AM By: Yevonne Pax RN Entered By: Yevonne Pax on 04/29/2023 09:58:42

## 2023-04-30 DIAGNOSIS — I5042 Chronic combined systolic (congestive) and diastolic (congestive) heart failure: Secondary | ICD-10-CM | POA: Diagnosis not present

## 2023-04-30 DIAGNOSIS — S8012XA Contusion of left lower leg, initial encounter: Secondary | ICD-10-CM | POA: Diagnosis not present

## 2023-04-30 DIAGNOSIS — I480 Paroxysmal atrial fibrillation: Secondary | ICD-10-CM | POA: Diagnosis not present

## 2023-04-30 DIAGNOSIS — I10 Essential (primary) hypertension: Secondary | ICD-10-CM | POA: Diagnosis not present

## 2023-04-30 DIAGNOSIS — L97822 Non-pressure chronic ulcer of other part of left lower leg with fat layer exposed: Secondary | ICD-10-CM | POA: Diagnosis not present

## 2023-05-04 ENCOUNTER — Other Ambulatory Visit: Payer: Self-pay | Admitting: Family Medicine

## 2023-05-06 NOTE — Telephone Encounter (Signed)
Requested medication (s) are due for refill today: yes  Requested medication (s) are on the active medication list: yes  Last refill:  12/31/22 #90 0 refills  Future visit scheduled: no   Notes to clinic:  no refills remain. Do you want to refill Rx?     Requested Prescriptions  Pending Prescriptions Disp Refills   amLODipine (NORVASC) 5 MG tablet [Pharmacy Med Name: AMLODIPINE BESYLATE 5 MG TAB] 90 tablet 0    Sig: TAKE 1 TABLET BY MOUTH EVERY DAY     Cardiovascular: Calcium Channel Blockers 2 Passed - 05/04/2023  1:27 AM      Passed - Last BP in normal range    BP Readings from Last 1 Encounters:  04/17/23 111/71         Passed - Last Heart Rate in normal range    Pulse Readings from Last 1 Encounters:  04/17/23 70         Passed - Valid encounter within last 6 months    Recent Outpatient Visits           2 weeks ago Skin tear of left lower leg without complication, sequela   [facility] [name], FNP   7 months ago Annual physical exam   [facility] [name], FNP   11 months ago Cough, unspecified type   [facility] Malva Limes, MD   1 year ago Irritability   [facility] [place], [place], PA-C   1 year ago Paroxysmal atrial fibrillation [facility])   [facility] [name], [place]

## 2023-05-09 ENCOUNTER — Encounter: Payer: Medicare PPO | Admitting: Physician Assistant

## 2023-05-09 DIAGNOSIS — S8012XA Contusion of left lower leg, initial encounter: Secondary | ICD-10-CM | POA: Diagnosis not present

## 2023-05-09 DIAGNOSIS — I5042 Chronic combined systolic (congestive) and diastolic (congestive) heart failure: Secondary | ICD-10-CM | POA: Diagnosis not present

## 2023-05-09 DIAGNOSIS — I110 Hypertensive heart disease with heart failure: Secondary | ICD-10-CM | POA: Diagnosis not present

## 2023-05-09 DIAGNOSIS — I480 Paroxysmal atrial fibrillation: Secondary | ICD-10-CM | POA: Diagnosis not present

## 2023-05-09 DIAGNOSIS — Z7901 Long term (current) use of anticoagulants: Secondary | ICD-10-CM | POA: Diagnosis not present

## 2023-05-09 DIAGNOSIS — L97822 Non-pressure chronic ulcer of other part of left lower leg with fat layer exposed: Secondary | ICD-10-CM | POA: Diagnosis not present

## 2023-05-16 ENCOUNTER — Ambulatory Visit (INDEPENDENT_AMBULATORY_CARE_PROVIDER_SITE_OTHER): Payer: Medicare PPO | Admitting: Family Medicine

## 2023-05-16 ENCOUNTER — Encounter: Payer: Self-pay | Admitting: Family Medicine

## 2023-05-16 VITALS — BP 122/58 | HR 84 | Temp 97.6°F | Ht 59.0 in | Wt 115.0 lb

## 2023-05-16 DIAGNOSIS — I5043 Acute on chronic combined systolic (congestive) and diastolic (congestive) heart failure: Secondary | ICD-10-CM

## 2023-05-16 DIAGNOSIS — Z950 Presence of cardiac pacemaker: Secondary | ICD-10-CM | POA: Diagnosis not present

## 2023-05-16 DIAGNOSIS — I480 Paroxysmal atrial fibrillation: Secondary | ICD-10-CM

## 2023-05-16 DIAGNOSIS — J069 Acute upper respiratory infection, unspecified: Secondary | ICD-10-CM | POA: Diagnosis not present

## 2023-05-16 NOTE — Progress Notes (Signed)
Established patient visit   Patient: [name]   DOB: 06-Aug-1925   89 y.o. Female  MRN: [id number] Visit Date: 05/16/2023  Today's healthcare provider: [name], FNP  Re Introduced to nurse practitioner role and practice setting.  All questions answered.  Discussed provider/patient relationship and expectations.   Chief Complaint  Patient presents with   Cough    Pt stated--been having wet cough, but no chest pain, congestion worse in the morning--over 1 week.   Subjective    Cough   HPI     Cough    Additional comments: Pt stated--been having wet cough, but no chest pain, congestion worse in the morning--over 1 week.      Last edited by Shelly Bombard, CMA on 05/16/2023  1:48 PM.      COUGH Duration: ~8 days Circumstances of initial development of cough: nothing Cough severity: mild Cough description: productive, wet, and irritating Aggravating factors:  worse in the AM Alleviating factors: nothing Status:  stable Treatments attempted: none Wheezing: no Shortness of breath: no Chest pain: no Chest tightness:no Nasal congestion: yes Runny nose: yes Postnasal drip: no Frequent throat clearing or swallowing: no Hemoptysis: no Fevers: no Night sweats: no Weight loss: no Heartburn: no Recent foreign travel: no Tuberculosis contacts: no   Medications: Outpatient Medications Prior to Visit  Medication Sig   amLODipine (NORVASC) 5 MG tablet Take 1 tablet (5 mg total) by mouth daily.   atorvastatin (LIPITOR) 10 MG tablet Take 1 tablet (10 mg total) by mouth daily. Please schedule office visit before any future refill   calcium carbonate (OS-CAL - DOSED IN MG OF ELEMENTAL CALCIUM) 1250 (500 Ca) MG tablet Take 1 tablet by mouth 2 (two) times daily with a meal.   dipyridamole-aspirin (AGGRENOX) 200-25 MG 12hr capsule TAKE 1 CAPSULE BY MOUTH TWICE A DAY   empagliflozin (JARDIANCE) 10 MG TABS tablet Take 1 tablet (10 mg total) by mouth daily.   loratadine  (CLARITIN) 10 MG tablet Take 10 mg by mouth daily.   losartan (COZAAR) 50 MG tablet TAKE 1 TABLET BY MOUTH TWICE A DAY   metoprolol (TOPROL-XL) 200 MG 24 hr tablet TAKE 1 TABLET BY MOUTH EVERY DAY   Multiple Vitamins-Minerals (PRESERVISION AREDS 2 PO) Take 1 tablet by mouth in the morning and at bedtime.   multivitamin-iron-minerals-folic acid (CENTRUM) chewable tablet Chew 1 tablet by mouth daily.   Omega-3 Fatty Acids (FISH OIL) 1000 MG CAPS Take 1 capsule by mouth daily.   potassium chloride (KLOR-CON) 10 MEQ tablet Take 10 mEq by mouth as needed. Only if she takes an additional dose of lasix   torsemide (DEMADEX) 20 MG tablet TAKE 2 TABLETS (40 MG TOTAL) BY MOUTH DAILY. AND ADDITIONAL 20MG  IF NEEDED (Patient taking differently: Take 20 mg by mouth once.)   [DISCONTINUED] doxycycline (VIBRA-TABS) 100 MG tablet Take 1 tablet (100 mg total) by mouth 2 (two) times daily.   No facility-administered medications prior to visit.    Review of Systems  Respiratory:  Positive for cough.       Objective    BP (!) 122/58 (BP Location: Right Arm, Patient Position: Sitting, Cuff Size: Small)   Pulse 84   Temp 97.6 F (36.4 C)   Ht 4\' 11"  (1.499 m)   Wt 115 lb (52.2 kg)   LMP  (LMP Unknown)   SpO2 98%   BMI 23.23 kg/m  BP Readings from Last 3 Encounters:  05/16/23 (!) 122/58  04/17/23  111/71  04/08/23 110/63   Wt Readings from Last 3 Encounters:  05/16/23 115 lb (52.2 kg)  04/17/23 115 lb 3.2 oz (52.3 kg)  02/18/23 113 lb 8 oz (51.5 kg)      Physical Exam Vitals and nursing note reviewed.  Constitutional:      General: She is not in acute distress.    Appearance: Normal appearance. She is normal weight. She is not ill-appearing, toxic-appearing or diaphoretic.  HENT:     Head: Normocephalic and atraumatic.     Right Ear: Tympanic membrane, ear canal and external ear normal.     Left Ear: Tympanic membrane, ear canal and external ear normal.     Nose: Nose normal.      Mouth/Throat:     Mouth: Mucous membranes are moist.     Pharynx: Oropharynx is clear.  Cardiovascular:     Rate and Rhythm: Normal rate and regular rhythm.     Pulses: Normal pulses.     Heart sounds: Normal heart sounds. No murmur heard.    No friction rub. No gallop.  Pulmonary:     Effort: Pulmonary effort is normal. No respiratory distress.     Breath sounds: Normal breath sounds. No stridor. No wheezing, rhonchi or rales.     Comments: Normal work of breathing on RA; no accessory muscle use. Chest:     Chest wall: No tenderness.  Musculoskeletal:        General: No swelling, tenderness, deformity or signs of injury. Normal range of motion.     Right lower leg: No edema.     Left lower leg: No edema.     Comments: At baseline; use of walker  Skin:    General: Skin is warm and dry.     Capillary Refill: Capillary refill takes less than 2 seconds.     Coloration: Skin is not jaundiced or pale.     Findings: No bruising, erythema, lesion or rash.     Comments: Moderate skin tenting to dorsal aspect of hands; continue to encourage good diet and hydration to assist   Neurological:     General: No focal deficit present.     Mental Status: She is alert and oriented to person, place, and time. Mental status is at baseline.     Cranial Nerves: No cranial nerve deficit.     Sensory: No sensory deficit.     Motor: No weakness.     Coordination: Coordination normal.  Psychiatric:        Mood and Affect: Mood normal.        Behavior: Behavior normal.        Thought Content: Thought content normal.        Judgment: Judgment normal.     No results found for any visits on 05/16/23.  Assessment & Plan     Problem List Items Addressed This Visit       Cardiovascular and Mediastinum   Acute on chronic combined systolic and diastolic CHF (congestive heart failure) (HCC)   Relevant Orders   Ambulatory referral to Cardiology   Paroxysmal atrial fibrillation [facility])   Relevant Orders    Ambulatory referral to Cardiology     Respiratory   Viral URI with cough - Primary    Acute, stable Continue supportive care Defer COVID testing given no current c/o Likely viral or allergic given rhinorrhea RTC as needed         Other   Pacemaker   Relevant Orders  Ambulatory referral to Cardiology   Request from family to refer back to cardiology for chronic f/u.     Leilani Merl, FNP, have reviewed all documentation for this visit. The documentation on 05/16/23 for the exam, diagnosis, procedures, and orders are all accurate and complete.  [name], FNP  [facility] [phone] (phone) [phone] (fax)  [facility]

## 2023-05-16 NOTE — Assessment & Plan Note (Signed)
Acute, stable Continue supportive care Defer COVID testing given no current c/o Likely viral or allergic given rhinorrhea RTC as needed

## 2023-05-16 NOTE — Patient Instructions (Addendum)
Your symptoms and exam findings are most consistent with a viral upper respiratory infection. These usually run their course in 5-7 days. Unfortunately, antibiotics don't work against viruses and just increase your risk of other issues such as diarrhea, yeast infections, and resistant infections.  If you start feeling worse with facial pain, high fever, cough, shortness of breath or start feeling significantly worse, please call us right away to be further evaluated.  Some things that can make you feel better are: - Increased rest - Increasing Fluids - Acetaminophen / ibuprofen as needed for fever/pain.  - Salt water gargling, chloraseptic spray and throat lozenges - OTC pseudoephedrine.  - Mucinex.  - Saline sinus flushes or a neti pot.  - Humidifying the air. 

## 2023-05-19 ENCOUNTER — Encounter: Payer: Medicare PPO | Attending: Physician Assistant | Admitting: Physician Assistant

## 2023-05-19 DIAGNOSIS — I5042 Chronic combined systolic (congestive) and diastolic (congestive) heart failure: Secondary | ICD-10-CM | POA: Diagnosis not present

## 2023-05-19 DIAGNOSIS — L97822 Non-pressure chronic ulcer of other part of left lower leg with fat layer exposed: Secondary | ICD-10-CM | POA: Diagnosis not present

## 2023-05-19 DIAGNOSIS — Z950 Presence of cardiac pacemaker: Secondary | ICD-10-CM | POA: Diagnosis not present

## 2023-05-19 DIAGNOSIS — Z7901 Long term (current) use of anticoagulants: Secondary | ICD-10-CM | POA: Insufficient documentation

## 2023-05-19 DIAGNOSIS — I480 Paroxysmal atrial fibrillation: Secondary | ICD-10-CM | POA: Insufficient documentation

## 2023-05-19 DIAGNOSIS — W2209XA Striking against other stationary object, initial encounter: Secondary | ICD-10-CM | POA: Diagnosis not present

## 2023-05-19 DIAGNOSIS — S8012XA Contusion of left lower leg, initial encounter: Secondary | ICD-10-CM | POA: Insufficient documentation

## 2023-05-19 DIAGNOSIS — I110 Hypertensive heart disease with heart failure: Secondary | ICD-10-CM | POA: Insufficient documentation

## 2023-05-19 DIAGNOSIS — S81812A Laceration without foreign body, left lower leg, initial encounter: Secondary | ICD-10-CM | POA: Diagnosis not present

## 2023-05-21 NOTE — Progress Notes (Signed)
[name] ([id number]) [id number] Page 1 of 7 Visit Report for 05/19/2023 Chief Complaint Document Details Patient Name: Date of Service: [name], [name] 05/19/2023 3:30 PM Medical Record Number: [id number] Patient Account Number: [id number] Date of Birth/Sex: Treating RN: 1925-11-10 (89 y.o. [name] Primary Care Provider: Merita [name] Other Clinician: Referring Provider: Treating Provider/Extender: [name] in Treatment: 2 Information Obtained from: Patient Chief Complaint Left LE ulcer Electronic Signature(s) Signed: 05/19/2023 3:42:03 PM By: Kylie Derry PA-C Entered By: [name] on 05/19/2023 15:42:03 -------------------------------------------------------------------------------- Debridement Details Patient Name: Date of Service: [name], [name] 05/19/2023 3:30 PM Medical Record Number: [id number] Patient Account Number: [id number] Date of Birth/Sex: Treating RN: 04-25-1925 (89 y.o. [name] Primary Care Provider: Merita [name] Other Clinician: Referring Provider: Treating Provider/Extender: [name] in Treatment: 2 Debridement Performed for Assessment: Wound #3 Left Lower Leg Performed By: Physician Kylie Derry, PA-C Debridement Type: Debridement Level of Consciousness (Pre-procedure): Awake and Alert Pre-procedure Verification/Time Out Yes - 16:04 Taken: Start Time: 16:04 Pain Control: Lidocaine 4% T opical Solution Percent of Wound Bed Debrided: 100% T Area Debrided (cm): otal 1.51 Tissue and other material debrided: Viable, Non-Viable, Slough, Subcutaneous, Skin: Dermis , Skin: Epidermis, Slough Level: Skin/Subcutaneous Tissue Debridement Description: Excisional Instrument: Curette Bleeding: Minimum Hemostasis Achieved: Pressure Procedural Pain: 0 Post Procedural Pain: 0 Response to Treatment: Procedure was tolerated well Level of Consciousness (Post- Awake and  Alert procedure): Post Debridement Measurements of Total Wound Length: (cm) 1.6 Width: (cm) 1.2 Depth: (cm) 0.4 Volume: (cm) 0.603 Character of Wound/Ulcer Post Debridement: Stable Post Procedure Diagnosis Same as Pre-procedure Electronic Signature(s) Signed: 05/20/2023 4:52:35 PM By: Kylie Aver MSN RN CNS WTA Signed: 05/20/2023 6:26:43 PM By: Kylie Derry PA-C Entered By: [name] on 05/19/2023 16:05:25 [name] ([id number]) [id number] Page 2 of 7 -------------------------------------------------------------------------------- HPI Details Patient Name: Date of Service: [name], [name] 05/19/2023 3:30 PM Medical Record Number: [id number] Patient Account Number: [id number] Date of Birth/Sex: Treating RN: Dec 03, 1925 (89 y.o. [name] Primary Care Provider: Merita [name] Other Clinician: Referring Provider: Treating Provider/Extender: [name] in Treatment: 2 History of Present Illness HPI Description: 03-19-2022 upon evaluation today patient appears to be doing poorly in regard to the left posterior lower extremity ulcer. This is something that she actually struck on a car door getting out of the car and this was around January 18, 2022. She was going to urgent care due to her leg swelling when this occurred. Since that time she has had a tremendous amount of bleeding she also did have recommendations for medicated gauze to be used by urgent care. Initially she actually apparently had sutures placed over this area but then when the sutures were removed this dehisced to some degree. She was placed on Omnicef in the past there is no signs of infection right now but again that is definitely something we will need to keep a close eye on especially with the depth of the wound. The good news that she does not seem to be having any pain which is excellent. And again right now they have been using medicated gauze packing into the  wound. She did have an x-ray on February 03, 2022 which was negative for any signs of bone abnormality which is great news. Patient does have a history of chronic venous insufficiency/hypertension with the lower extremity ulcer now at this point. 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 03-25-2022 upon evaluation today patient appears to be doing better in regard to the wound on her leg. She has been tolerating the dressing changes without complication. Fortunately I do not see any signs of active infection locally or systemically at this time which is great news. 04-01-2022 upon evaluation today patient appears to be doing well with regard to her right leg. Fortunately there does not appear to be any evidence of active infection locally nor systemically which is great news and overall I am extremely pleased with where we stand today. I do believe that she is making progress there is still some need for sharp debridement at this point. 04-08-2022 upon evaluation today patient actually appears to be making excellent progress. I am actually very pleased with where we stand today. I do not see any signs of active infection locally or systemically which is great news. No fevers, chills, nausea, vomiting, or diarrhea. 5/8 using endoform which was started last week. Wound appears smaller with less depth. 04-22-2022 upon evaluation today patient appears to be doing well in regard to a large portion of her wound although there is one area that does have me concerned about the possibility of skin cancer. I do think that we need to take a sample biopsy to send for evaluation at this point. Patient is in agreement with that plan. Her family member who is with her states that she is also prone to skin cancers she has had 1 recently removed at the skin surgery center in [place]. They did give [place] the number to that location  if indeed we need to end up making a referral. For now we will get a see how things go and what the biopsy shows initially. 04-29-2022 upon evaluation today patient appears to be doing well currently in regard to her wound in fact it actually appears to be a lot better especially after last week's biopsy. Fortunately there does not appear to be any signs of active infection locally or systemically at this time which is great news. No fevers, chills, nausea, vomiting, or diarrhea. Upon inspection the patient actually showed signs of complete resolution based on what we are seeing currently. Fortunately I do not see any evidence of infection which is great news she does have an area that is distal to where she had the original wound and this is opened today and appears to be an area of irritation unfortunately. Fortunately there does not appear to be any signs of active infection locally or systemically which is great news. Her appointment is July 14 for the skin surgery center.Marland Kitchen 05-27-2022 upon evaluation today patient appears to be doing well currently in regard to her wound in fact this appears to be completely healed which is great news. I see nothing open including the area where she had the skin cancer this is actually completely healed. In fact I cannot even tell personally where this was biopsied previously. Readmission: 04-29-2023 upon evaluation today patient appears to be doing somewhat poorly in regard to her wound over her left anterior lower extremity. She has been tolerating the dressing changes she unfortunately had a injury where she fell and had a contusion that occurred with a skin tear on 04-08-2023. Since that time she has been having ongoing issues with this not really improving as well and as quickly as they would like to see. Fortunately there does not appear to be any signs of active infection at this time which is great news  and overall I am extremely pleased with where she  stands. Patient does have a history of hypertension, atrial fibrillation for which she is on anticoagulant therapy, and congestive heart failure. 05-09-2023 upon evaluation today patient appears to be doing excellent in regard to her wound which is actually showing signs of good improvement I am much happier with where things stand today compared to previous. 05-19-2023 upon evaluation today patient appears to be doing well currently in regard to her wound. She has been tolerating the dressing changes without complication. In fact I really feel like we are showing signs of excellent improvement at this point. I see no signs of infection which is great news. Electronic Signature(s) Signed: 05/19/2023 5:33:30 PM By: Kylie Derry PA-C Entered By: [name] on 05/19/2023 17:33:30 -------------------------------------------------------------------------------- Physical Exam Details Patient Name: Date of Service: [name], [name] 05/19/2023 3:30 PM Medical Record Number: [id number] Patient Account Number: [id number] Date of Birth/Sex: Treating RN: 10/31/1925 (89 y.o. [name] Primary Care Provider: Merita [name] Other Clinician: Referring Provider: Treating Provider/Extender: Cline Cools [place], [name] ([id number]) [phone].pdf Page 3 of 7 Weeks in Treatment: 2 Constitutional Well-nourished and well-hydrated in no acute distress. Respiratory normal breathing without difficulty. Psychiatric this patient is able to make decisions and demonstrates good insight into disease process. Alert and Oriented x 3. pleasant and cooperative. Notes Upon inspection patient's wound bed actually showed signs of excellent granulation epithelization at this point. I think that she is making great progress and we are getting close to switching to the collagen they were not quite there yet. Electronic Signature(s) Signed: 05/19/2023 5:33:41 PM By: Kylie Derry PA-C Entered By:  [name] on 05/19/2023 17:33:41 -------------------------------------------------------------------------------- Physician Orders Details Patient Name: Date of Service: [name], [name] 05/19/2023 3:30 PM Medical Record Number: [id number] Patient Account Number: [id number] Date of Birth/Sex: Treating RN: April 05, 1925 (89 y.o. 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 Bathing/ Shower/ Hygiene May shower; gently cleanse wound with antibacterial soap, rinse and pat dry prior to dressing wounds Anesthetic (Use 'Patient Medications' Section for Anesthetic Order Entry) Lidocaine applied to wound bed Edema Control - Lymphedema / Segmental Compressive Device / Other Elevate, Exercise Daily and A void Standing for Long Periods of Time. Elevate legs to the level of the heart and pump ankles as often as possible Elevate leg(s) parallel to the floor when sitting. Wound Treatment Wound #3 - Lower Leg Wound Laterality: Left Cleanser: Byram Ancillary Kit - 15 Day Supply (Generic) 1 x Per Day/30 Days Discharge Instructions: Use supplies as instructed; Kit contains: (15) Saline Bullets; (15) 3x3 Gauze; 15 pr Gloves Cleanser: Dakin 16 (oz) 0.25 1 x Per Day/30 Days Discharge Instructions: Use as directed. Prim Dressing: Gauze (Generic) 1 x Per Day/30 Days ary Discharge Instructions: moistened with Dakins  Solution Secondary Dressing: (BORDER) Zetuvit Plus SILICONE BORDER Dressing 4x4 (in/in) (Generic) 1 x Per Day/30 Days Discharge Instructions: Please do not put silicone bordered dressings under wraps. Use non-bordered dressing only. [place], Marylouise [name] ([id number]) [id number] Page 4 of 7 Electronic Signature(s)  Signed: 05/20/2023 4:52:35 PM By: Kylie Aver MSN RN CNS WTA Signed: 05/20/2023 6:26:43 PM By: Kylie Derry PA-C Entered By: [name] on 05/19/2023 16:05:58 -------------------------------------------------------------------------------- Problem List Details Patient Name: Date of Service: ALISAH, FERNER 05/19/2023 3:30 PM Medical Record Number: [id number] Patient Account Number: [id number] Date of Birth/Sex: Treating RN: [birth year] (89 y.o. [name] Primary Care Provider: Merita [name] Other Clinician: Referring Provider: Treating Provider/Extender: [name] in Treatment: 2 Active Problems ICD-10 Encounter Code Description Active Date MDM Diagnosis S80.12XA Contusion of left lower leg, initial encounter 04/29/2023 No Yes L97.822 Non-pressure chronic ulcer of other part of left lower leg with fat layer exposed5/21/2024 No Yes I50.42 Chronic combined systolic (congestive) and diastolic (congestive) heart failure 04/29/2023 No Yes I10 Essential (primary) hypertension 04/29/2023 No Yes I48.0 Paroxysmal atrial fibrillation 04/29/2023 No Yes Z79.01 Long term (current) use of anticoagulants 04/29/2023 No Yes Inactive Problems Resolved Problems Electronic Signature(s) Signed: 05/19/2023 3:41:58 PM By: Kylie Derry PA-C Entered By: [name] on 05/19/2023 15:41:58 -------------------------------------------------------------------------------- Progress Note Details Patient Name: Date of Service: [name] 05/19/2023 3:30 PM Medical Record Number: [id number] Patient Account Number: [id number] Date of Birth/Sex: Treating RN: [birth year]  (89 y.o. [name] Primary Care Provider: Merita [name] Other Clinician: Referring Provider: Treating Provider/Extender: [name] in Treatment: 2 Subjective Chief Complaint Information obtained from Patient Left LE ulcer [place], Marylouise [name] ([id number]) [id number] Page 5 of 7 History of Present Illness (HPI) 03-19-2022 upon evaluation today patient appears to be doing poorly in regard to the left posterior lower extremity ulcer. This is something that she actually struck on a car door getting out of the car and this was around January 18, 2022. She was going to urgent care due to her leg swelling when this occurred. Since that time she has had a tremendous amount of bleeding she also did have recommendations for medicated gauze to be used by urgent care. Initially she actually apparently had sutures placed over this area but then when the sutures were removed this dehisced to some degree. She was placed on Omnicef in the past there is no signs of infection right now but again that is definitely something we will need to keep a close eye on especially with the depth of the wound. The good news that she does not seem to be having any pain which is excellent. And again right now they have been using medicated gauze packing into the wound. She did have an x-ray on February 03, 2022 which was negative for any signs of bone abnormality which is great news. Patient does have a history of chronic venous insufficiency/hypertension with the lower extremity ulcer now at this point. 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 03-25-2022 upon evaluation today patient appears to be doing better in regard to the wound on her leg. She has been tolerating the dressing changes without complication. Fortunately I do not see any signs of active infection locally or systemically  at this time which is great news. 04-01-2022 upon evaluation today patient appears to be doing well with regard to her right leg. Fortunately there does not appear to be any evidence of active infection locally nor systemically which is great news and overall I am extremely pleased with where we stand today. I do believe that she is making progress there is still some need for sharp debridement at this point. 04-08-2022 upon evaluation today  patient actually appears to be making excellent progress. I am actually very pleased with where we stand today. I do not see any signs of active infection locally or systemically which is great news. No fevers, chills, nausea, vomiting, or diarrhea. 5/8 using endoform which was started last week. Wound appears smaller with less depth. 04-22-2022 upon evaluation today patient appears to be doing well in regard to a large portion of her wound although there is one area that does have me concerned about the possibility of skin cancer. I do think that we need to take a sample biopsy to send for evaluation at this point. Patient is in agreement with that plan. Her family member who is with her states that she is also prone to skin cancers she has had 1 recently removed at the skin surgery center in [place]. They did give [place] the number to that location if indeed we need to end up making a referral. For now we will get a see how things go and what the biopsy shows initially. 04-29-2022 upon evaluation today patient appears to be doing well currently in regard to her wound in fact it actually appears to be a lot better especially after last week's biopsy. Fortunately there does not appear to be any signs of active infection locally or systemically at this time which is great news. No fevers, chills, nausea, vomiting, or diarrhea. Upon inspection the patient actually showed signs of complete resolution based on what we are seeing currently. Fortunately I do not see any  evidence of infection which is great news she does have an area that is distal to where she had the original wound and this is opened today and appears to be an area of irritation unfortunately. Fortunately there does not appear to be any signs of active infection locally or systemically which is great news. Her appointment is July 14 for the skin surgery center.Marland Kitchen 05-27-2022 upon evaluation today patient appears to be doing well currently in regard to her wound in fact this appears to be completely healed which is great news. I see nothing open including the area where she had the skin cancer this is actually completely healed. In fact I cannot even tell personally where this was biopsied previously. Readmission: 04-29-2023 upon evaluation today patient appears to be doing somewhat poorly in regard to her wound over her left anterior lower extremity. She has been tolerating the dressing changes she unfortunately had a injury where she fell and had a contusion that occurred with a skin tear on 04-08-2023. Since that time she has been having ongoing issues with this not really improving as well and as quickly as they would like to see. Fortunately there does not appear to be any signs of active infection at this time which is great news and overall I am extremely pleased with where she stands. Patient does have a history of hypertension, atrial fibrillation for which she is on anticoagulant therapy, and congestive heart failure. 05-09-2023 upon evaluation today patient appears to be doing excellent in regard to her wound which is actually showing signs of good improvement I am much happier with where things stand today compared to previous. 05-19-2023 upon evaluation today patient appears to be doing well currently in regard to her wound. She has been tolerating the dressing changes without complication. In fact I really feel like we are showing signs of excellent improvement at this point. I see no signs  of infection which is great news. Objective Constitutional Well-nourished  and well-hydrated in no acute distress. Vitals Time Taken: 3:47 PM, Height: 59 in, Weight: 116 lbs, BMI: 23.4, Temperature: 97.8 F, Pulse: 87 bpm, Respiratory Rate: 18 breaths/min, Blood Pressure: 122/60 mmHg. Respiratory normal breathing without difficulty. Psychiatric this patient is able to make decisions and demonstrates good insight into disease process. Alert and Oriented x 3. pleasant and cooperative. General Notes: Upon inspection patient's wound bed actually showed signs of excellent granulation epithelization at this point. I think that she is making great progress and we are getting close to switching to the collagen they were not quite there yet. Integumentary (Hair, Skin) Wound #3 status is Open. Original cause of wound was Trauma. The date acquired was: 04/08/2023. The wound has been in treatment 2 weeks. The wound is located on the Left Lower Leg. The wound measures 1.6cm length x 1.2cm width x 0.3cm depth; 1.508cm^2 area and 0.452cm^3 volume. There is Fat Layer (Subcutaneous Tissue) exposed. There is a medium amount of serosanguineous drainage noted. There is medium (34-66%) granulation within the wound bed. There is a medium (34-66%) amount of necrotic tissue within the wound bed including Adherent Slough. 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 There was a Excisional Skin/Subcutaneous Tissue Debridement with a  total ear area of 1.51 sq cm performed by Kylie Derry, PA-C. With the following instrument(s): Curette to remove Viable and Non-Viable tissue/material. Material removed includes Subcutaneous Tissue, Slough, Skin: Dermis, and Skin: Epidermis after achieving pain control using Lidocaine 4% T opical Solution. No specimens were taken. A time out was conducted at 16:04, prior to the start of the procedure. A Minimum amount of bleeding was controlled with Pressure. The procedure was tolerated well with a pain level of 0 throughout and a pain level of 0 following the procedure. Post Debridement Measurements: 1.6cm length x 1.2cm width x 0.4cm depth; 0.603cm^3 volume. Character of Wound/Ulcer Post Debridement is stable. Post procedure Diagnosis Wound #3: Same as Pre-Procedure Plan Follow-up Appointments: Return Appointment in 1 week. Bathing/ Shower/ Hygiene: May shower; gently cleanse wound with antibacterial soap, rinse and pat dry prior to dressing wounds Anesthetic (Use 'Patient Medications' Section for Anesthetic Order Entry): Lidocaine applied to wound bed Edema Control - Lymphedema / Segmental Compressive Device / Other: Elevate, Exercise Daily and Avoid Standing for Long Periods of Time. Elevate legs to the level of the heart and pump ankles as often as possible Elevate leg(s) parallel to the floor when sitting. WOUND #3: - Lower Leg Wound Laterality: Left Cleanser: Byram Ancillary Kit - 15 Day Supply (Generic) 1 x Per Day/30 Days Discharge Instructions: Use supplies as instructed; Kit contains: (15) Saline Bullets; (15) 3x3 Gauze; 15 pr Gloves Cleanser: Dakin 16 (oz) 0.25 1 x Per Day/30 Days Discharge Instructions: Use as directed. Prim Dressing: Gauze (Generic) 1 x Per Day/30 Days ary Discharge Instructions: moistened with Dakins Solution Secondary Dressing: (BORDER) Zetuvit Plus SILICONE BORDER Dressing 4x4 (in/in) (Generic) 1 x Per Day/30 Days Discharge Instructions: Please do not  put silicone bordered dressings under wraps. Use non-bordered dressing only. 1. I am going to make sure that we go ahead and initiate a continuation  of therapy with the Dakin's moistened gauze dressing I think this still good for the next week. 2. I would recommend that she continue as well with the Zetuvit to cover change daily. 3. My plan is to switch over to a collagen based dressing next week I think that would be an appropriate way to go. We will see patient back for reevaluation in 1 week here in the clinic. If anything worsens or changes patient will contact our office for additional recommendations. Electronic Signature(s) Signed: 05/19/2023 5:34:05 PM By: Kylie Derry PA-C Entered By: [name] on 05/19/2023 17:34:05 -------------------------------------------------------------------------------- SuperBill Details Patient Name: Date of Service: [name], [name] 05/19/2023 Medical Record Number: [id number] Patient Account Number: [id number] Date of Birth/Sex: Treating RN: Dec 05, 1925 (89 y.o. 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 I10 Essential (primary) hypertension I48.0 Paroxysmal atrial fibrillation Z79.01 Long term (current) use of anticoagulants Facility Procedures : CPT4 Code: [id number] Description: 11042 - DEB SUBQ TISSUE 20 SQ CM/< ICD-10 Diagnosis Description L97.822 Non-pressure chronic ulcer of other part of left lower leg with fat layer expo Modifier: sed Quantity: 1 Physician Procedures :  CPT4 Code Description Modifier [id number] 11042 - WC PHYS SUBQ TISS 20 SQ CM ICD-10 Diagnosis Description L97.822 Non-pressure chronic ulcer of other part of left lower leg with fat layer exposed Quantity: 1 Electronic Signature(s) Signed: 05/19/2023 5:34:18 PM By: Kylie Derry PA-C Entered By: [name] on 05/19/2023 17:34:17

## 2023-05-21 NOTE — Progress Notes (Signed)
[place], [name] ([id number]) [id number] Page 1 of 7 Visit Report for 05/19/2023 Arrival Information Details Patient Name: Date of Service: Kylie, [name] 05/19/2023 3:30 PM Medical Record Number: [id number] Patient Account Number: [id number] Date of Birth/Sex: Treating RN: [name] (89 y.o. [name] Primary Care [name]: [name] Other Clinician: Referring [name]: Treating [name]/Extender: [name] in Treatment: 2 Visit Information History Since Last Visit Added or deleted any medications: No Patient Arrived: Walker Has Dressing in Place as Prescribed: Yes Arrival Time: 15:41 Pain Present Now: No Accompanied By: daughter Transfer Assistance: None Patient Identification Verified: Yes Secondary Verification Process Completed: Yes Patient Requires Transmission-Based Precautions: No Patient Has Alerts: Yes Patient Alerts: Patient on Blood Thinner non compressible Electronic Signature(s) Signed: 05/20/2023 4:52:35 PM By: Kylie Aver MSN RN CNS WTA Entered By: [name] on 05/19/2023 15:47:24 -------------------------------------------------------------------------------- Clinic Level of Care Assessment Details Patient Name: Date of Service: [name] [facility] 05/19/2023 3:30 PM Medical Record Number: [id number] Patient Account Number: [id number] Date of Birth/Sex: Treating RN: [birth year] [name] (89 y.o. [name] Primary Care [name]: [name] Other Clinician: Referring [name]: Treating [name]/Extender: [name] in Treatment: 2 Clinic Level of Care Assessment Items TOOL 1 Quantity Score []  - 0 Use when EandM and Procedure is performed on INITIAL visit ASSESSMENTS - Nursing Assessment / Reassessment []  - 0 General Physical Exam (combine w/ comprehensive assessment (listed just below) when performed on new pt. evals) []  - 0 Comprehensive Assessment (HX, ROS, Risk Assessments, Wounds Hx,  etc.) ASSESSMENTS - Wound and Skin Assessment / Reassessment []  - 0 Dermatologic / Skin Assessment (not related to wound area) ASSESSMENTS - Ostomy and/or Continence Assessment and Care []  - 0 Incontinence Assessment and Management []  - 0 Ostomy Care Assessment and Management (repouching, etc.) PROCESS - Coordination of Care []  - 0 Simple Patient / Family Education for ongoing care []  - 0 Complex (extensive) Patient / Family Education for ongoing care []  - 0 Staff obtains Chiropractor, Records, T Results / Process Orders est []  - 0 Staff telephones HHA, Nursing Homes / Clarify orders / etc []  - 0 Routine Transfer to another Facility (non-emergent condition) []  - 0 Routine Hospital Admission (non-emergent condition) []  - 0 New Admissions / Insurance Authorizations / Ordering NPWT Apligraf, etc. , []  - 0 Emergency Hospital Admission (emergent condition) [place], [name] ([id number]) [id number] Page 2 of 7 PROCESS - Special Needs []  - 0 Pediatric / Minor Patient Management []  - 0 Isolation Patient Management []  - 0 Hearing / Language / Visual special needs []  - 0 Assessment of Community assistance (transportation, D/C planning, etc.) []  - 0 Additional assistance / Altered mentation []  - 0 Support Surface(s) Assessment (bed, cushion, seat, etc.) INTERVENTIONS - Miscellaneous []  - 0 External ear exam []  - 0 Patient Transfer (multiple staff / Nurse, adult / Similar devices) []  - 0 Simple Staple / Suture removal (25 or less) []  - 0 Complex Staple / Suture removal (26 or more) []  - 0 Hypo/Hyperglycemic Management (do not check if billed separately) []  - 0 Ankle / Brachial Index (ABI) - do not check if billed separately Has the patient been seen at the hospital within the last three years: Yes Total Score: 0 Level Of Care: ____ Electronic Signature(s) Signed: 05/20/2023 4:52:35 PM By: Kylie Aver MSN RN CNS WTA Entered By: [name] on 05/19/2023  16:06:07 -------------------------------------------------------------------------------- Encounter Discharge Information Details Patient Name: Date of Service: [name] [facility] 05/19/2023 3:30 PM Medical Record Number: [id number] Patient Account  Number: [id number] Date of Birth/Sex: Treating RN: [birth year] [name] (89 y.o. [name] Primary Care [name]: [name] Other Clinician: Referring [name]: Treating [name]/Extender: [name] in Treatment: 2 Encounter Discharge Information Items Post Procedure Vitals Discharge Condition: Stable Temperature (F): 97.8 Ambulatory Status: Walker Pulse (bpm): 87 Discharge Destination: Home Respiratory Rate (breaths/min): 16 Transportation: Private Auto Blood Pressure (mmHg): 122/60 Accompanied By: daughter Schedule Follow-up Appointment: Yes Clinical Summary of Care: Electronic Signature(s) Signed: 05/20/2023 4:52:35 PM By: Kylie Aver MSN RN CNS WTA Entered By: [name] on 05/19/2023 16:09:23 -------------------------------------------------------------------------------- Lower Extremity Assessment Details Patient Name: Date of Service: [name] [facility] 05/19/2023 3:30 PM Medical Record Number: [id number] Patient Account Number: [id number] Date of Birth/Sex: Treating RN: [birth year] (89 y.o. [name] Primary Care Zyion Leidner: [name] Other Clinician: Referring Jeanny Rymer: Treating Laila Myhre/Extender: [name] Weeks in Treatment: 2 Edema Assessment Assessed: [Left: No] Franne Forts: No] [Left: Edema] Franne Forts: :] H[LeftMalachy Moan, [name] ([id number])] [Right: [id number] Page 3 of 7] Calf Left: Right: Point of Measurement: 28 cm From Medial Instep 31.4 cm Ankle Left: Right: Point of Measurement: 10 cm From Medial Instep 25.3 cm Knee To Floor Left: Right: From Medial Instep 36 cm Vascular Assessment Pulses: Dorsalis Pedis Palpable: [Left:Yes] Electronic  Signature(s) Signed: 05/20/2023 4:52:35 PM By: Kylie Aver MSN RN CNS WTA Entered By: [name] on 05/19/2023 15:54:10 -------------------------------------------------------------------------------- Multi Wound Chart Details Patient Name: Date of Service: [name] [facility] 05/19/2023 3:30 PM Medical Record Number: [id number] Patient Account Number: [id number] Date of Birth/Sex: Treating RN: [birth year] (89 y.o. [name] Primary Care Sela Falk: [name] Other Clinician: Referring Eriyanna Kofoed: Treating Korynn Kenedy/Extender: [name] in Treatment: 2 Vital Signs Height(in): 59 Pulse(bpm): 87 Weight(lbs): 116 Blood Pressure(mmHg): 122/60 Body Mass Index(BMI): 23.4 Temperature(F): 97.8 Respiratory Rate(breaths/min): 18 [3:Photos:] [N/A:N/A] Left Lower Leg N/A N/A Wound Location: Trauma N/A N/A Wounding Event: Skin Tear N/A N/A Primary Etiology: Congestive Heart Failure, N/A N/A Comorbid History: Hypertension 04/08/2023 N/A N/A Date Acquired: 2 N/A N/A Weeks of Treatment: Open N/A N/A Wound Status: No N/A N/A Wound Recurrence: 1.6x1.2x0.3 N/A N/A Measurements L x W x D (cm) 1.508 N/A N/A A (cm) : rea 0.452 N/A N/A Volume (cm) : 52.00% N/A N/A % Reduction in Area: 28.00% N/A N/A % Reduction in Volume: Full Thickness Without Exposed N/A N/A Classification: Support Structures Medium N/A N/A Exudate Amount: Serosanguineous N/A N/A Exudate Type: red, brown N/A N/A Exudate Color: Medium (34-66%) N/A N/A Granulation Amount: Medium (34-66%) N/A N/A Necrotic Amount: Fat Layer (Subcutaneous Tissue): Yes N/A N/A Exposed Structures: [name] ([id number]) [id number] Page 4 of 7 Fascia: No Tendon: No Muscle: No Joint: No Bone: No None N/A N/A Epithelialization: Treatment Notes Electronic Signature(s) Signed: 05/20/2023 4:52:35 PM By: Kylie Aver MSN RN CNS WTA Entered By: [name] on 05/19/2023  16:02:37 -------------------------------------------------------------------------------- Multi-Disciplinary Care Plan Details Patient Name: Date of Service: [name] [facility] 05/19/2023 3:30 PM Medical Record Number: [id number] Patient Account Number: [id number] Date of Birth/Sex: Treating RN: [birth year][name] (89 y.o. [name] Primary Care Halo Laski: [name] Other Clinician: Referring Kaelon Weekes: Treating Jacob Chamblee/Extender: [name] in Treatment: 2 Active Inactive Necrotic Tissue Nursing Diagnoses: Knowledge deficit related to management of necrotic/devitalized tissue Goals: Necrotic/devitalized tissue will be minimized in the wound bed Date Initiated: 04/29/2023 Target Resolution Date: 05/30/2023 Goal Status: Active Interventions: Assess patient pain level pre-, during and post procedure and prior to discharge Notes: Wound/Skin Impairment Nursing Diagnoses: Knowledge deficit related to ulceration/compromised skin integrity  Goals: Patient/caregiver will verbalize understanding of skin care regimen Date Initiated: 04/29/2023 Target Resolution Date: 05/30/2023 Goal Status: Active Ulcer/skin breakdown will have a volume reduction of 30% by week 4 Date Initiated: 04/29/2023 Target Resolution Date: 05/30/2023 Goal Status: Active Ulcer/skin breakdown will have a volume reduction of 50% by week 8 Date Initiated: 04/29/2023 Target Resolution Date: 06/29/2023 Goal Status: Active Ulcer/skin breakdown will have a volume reduction of 80% by week 12 Date Initiated: 04/29/2023 Target Resolution Date: 07/30/2023 Goal Status: Active Ulcer/skin breakdown will heal within 14 weeks Date Initiated: 04/29/2023 Target Resolution Date: 08/30/2023 Goal Status: Active Interventions: Assess patient/caregiver ability to obtain necessary supplies Assess patient/caregiver ability to perform ulcer/skin care regimen upon admission and as needed Assess ulceration(s) every  visit Notes: Electronic Signature(s) Signed: 05/20/2023 4:52:35 PM By: Kylie Aver MSN RN CNS Jennette Bill, [facility] 05/20/2023 4:52:35 PM By: Kylie Aver MSN RN CNS WTA Signed: ([id number]) [id number] Page 5 of 7 Entered By: [name] on 05/19/2023 16:06:29 -------------------------------------------------------------------------------- Pain Assessment Details Patient Name: Date of Service: BRAIDYN, LINEBACK 05/19/2023 3:30 PM Medical Record Number: [id number] Patient Account Number: [id number] Date of Birth/Sex: Treating RN: [name] (89 y.o. [name] Primary Care Jozsef Wescoat: [name] Other Clinician: Referring Pamelyn Bancroft: Treating Kecia Swoboda/Extender: [name] in Treatment: 2 Active Problems Location of Pain Severity and Description of Pain Patient Has Paino No Site Locations Pain Management and Medication Current Pain Management: Electronic Signature(s) Signed: 05/20/2023 4:52:35 PM By: Kylie Aver MSN RN CNS WTA Entered By: [name] on 05/19/2023 15:48:02 -------------------------------------------------------------------------------- Patient/Caregiver Education Details Patient Name: Date of Service: Harrold Donath 6/10/2024andnbsp3:30 PM Medical Record Number: [id number] Patient Account Number: [id number] Date of Birth/Gender: Treating RN: [name] (89 y.o. [name] Primary Care Physician: [name] Other Clinician: Referring Physician: Treating Physician/Extender: [name] in Treatment: 2 Education Assessment Education Provided To: Patient and Caregiver Education Topics Provided Wound Debridement: Handouts: Wound Debridement Methods: Explain/Verbal Responses: State content correctly Wound/Skin Impairment: Handouts: Caring for Your Ulcer Methods: Explain/Verbal Responses: State content correctly [place], [name] ([id number]) [id number] Page 6 of  7 Electronic Signature(s) Signed: 05/20/2023 4:52:35 PM By: Kylie Aver MSN RN CNS WTA Entered By: [name] on 05/19/2023 16:06:49 -------------------------------------------------------------------------------- Wound Assessment Details Patient Name: Date of Service: KINNEDY, MARR 05/19/2023 3:30 PM Medical Record Number: [id number] Patient Account Number: [id number] Date of Birth/Sex: Treating RN: [birth year][name] (89 y.o. [name] Primary Care Cleto Claggett: [name] Other Clinician: Referring Teigan Manner: Treating Ayra Hodgdon/Extender: [name] in Treatment: 2 Wound Status Wound Number: 3 Primary Etiology: Skin Tear Wound Location: Left Lower Leg Wound Status: Open Wounding Event: Trauma Comorbid History: Congestive Heart Failure, Hypertension Date Acquired: 04/08/2023 Weeks Of Treatment: 2 Clustered Wound: No Photos Wound Measurements Length: (cm) 1.6 Width: (cm) 1.2 Depth: (cm) 0.3 Area: (cm) 1.508 Volume: (cm) 0.452 % Reduction in Area: 52% % Reduction in Volume: 28% Epithelialization: None Wound Description Classification: Full Thickness Without Exposed Support Structures Exudate Amount: Medium Exudate Type: Serosanguineous Exudate Color: red, brown Foul Odor After Cleansing: No Slough/Fibrino Yes Wound Bed Granulation Amount: Medium (34-66%) Exposed Structure Necrotic Amount: Medium (34-66%) Fascia Exposed: No Necrotic Quality: Adherent Slough Fat Layer (Subcutaneous Tissue) Exposed: Yes Tendon Exposed: No Muscle Exposed: No Joint Exposed: No Bone Exposed: No Treatment Notes Wound #3 (Lower Leg) Wound Laterality: Left Cleanser Byram Ancillary Kit - 15 Day Supply Discharge Instruction: Use supplies as instructed; Kit contains: (15) Saline Bullets; (15) 3x3 Gauze; 15 pr Gloves Dakin 16 (oz) 0.25  Discharge Instruction: Use as directed. [place], [name] ([id number]) [id number] Page 7 of 7 Peri-Wound  Care Topical Primary Dressing Gauze Discharge Instruction: moistened with Dakins Solution Secondary Dressing (BORDER) Zetuvit Plus SILICONE BORDER Dressing 4x4 (in/in) Discharge Instruction: Please do not put silicone bordered dressings under wraps. Use non-bordered dressing only. Secured With Compression Wrap Compression Stockings Facilities manager) Signed: 05/20/2023 4:52:35 PM By: Kylie Aver MSN RN CNS WTA Entered By: [name] on 05/19/2023 15:52:54 -------------------------------------------------------------------------------- Vitals Details Patient Name: Date of Service: [name] [facility] 05/19/2023 3:30 PM Medical Record Number: [id number] Patient Account Number: [id number] Date of Birth/Sex: Treating RN: [birth year] [name] (89 y.o. [name] Primary Care Druanne Bosques: [name] Other Clinician: Referring Zandria Woldt: Treating Naiara Lombardozzi/Extender: [name] in Treatment: 2 Vital Signs Time Taken: 15:47 Temperature (F): 97.8 Height (in): 59 Pulse (bpm): 87 Weight (lbs): 116 Respiratory Rate (breaths/min): 18 Body Mass Index (BMI): 23.4 Blood Pressure (mmHg): 122/60 Reference Range: 80 - 120 mg / dl Electronic Signature(s) Signed: 05/20/2023 4:52:35 PM By: Kylie Aver MSN RN CNS WTA Entered By: [name] on 05/19/2023 15:47:45

## 2023-05-26 ENCOUNTER — Ambulatory Visit: Payer: Medicare PPO | Admitting: Physician Assistant

## 2023-05-27 ENCOUNTER — Other Ambulatory Visit
Admission: RE | Admit: 2023-05-27 | Discharge: 2023-05-27 | Disposition: A | Discharge status: Home / Self Care | Payer: Medicare PPO | Source: Ambulatory Visit | Attending: Medical | Admitting: Medical

## 2023-05-27 ENCOUNTER — Encounter: Payer: Self-pay | Admitting: Medical

## 2023-05-27 ENCOUNTER — Encounter: Payer: Medicare PPO | Admitting: Physician Assistant

## 2023-05-27 ENCOUNTER — Ambulatory Visit: Payer: Medicare PPO | Attending: Medical | Admitting: Medical

## 2023-05-27 ENCOUNTER — Other Ambulatory Visit: Payer: Self-pay | Admitting: Family Medicine

## 2023-05-27 VITALS — BP 114/64 | HR 61 | Ht 59.0 in | Wt 118.0 lb

## 2023-05-27 DIAGNOSIS — L97822 Non-pressure chronic ulcer of other part of left lower leg with fat layer exposed: Secondary | ICD-10-CM | POA: Diagnosis not present

## 2023-05-27 DIAGNOSIS — Z7901 Long term (current) use of anticoagulants: Secondary | ICD-10-CM | POA: Diagnosis not present

## 2023-05-27 DIAGNOSIS — Z950 Presence of cardiac pacemaker: Secondary | ICD-10-CM

## 2023-05-27 DIAGNOSIS — I5043 Acute on chronic combined systolic (congestive) and diastolic (congestive) heart failure: Secondary | ICD-10-CM | POA: Diagnosis not present

## 2023-05-27 DIAGNOSIS — I4821 Permanent atrial fibrillation: Secondary | ICD-10-CM

## 2023-05-27 DIAGNOSIS — R600 Localized edema: Secondary | ICD-10-CM | POA: Insufficient documentation

## 2023-05-27 DIAGNOSIS — R011 Cardiac murmur, unspecified: Secondary | ICD-10-CM

## 2023-05-27 DIAGNOSIS — Z79899 Other long term (current) drug therapy: Secondary | ICD-10-CM | POA: Diagnosis not present

## 2023-05-27 DIAGNOSIS — I5042 Chronic combined systolic (congestive) and diastolic (congestive) heart failure: Secondary | ICD-10-CM | POA: Diagnosis not present

## 2023-05-27 DIAGNOSIS — I480 Paroxysmal atrial fibrillation: Secondary | ICD-10-CM | POA: Diagnosis not present

## 2023-05-27 DIAGNOSIS — S8012XA Contusion of left lower leg, initial encounter: Secondary | ICD-10-CM | POA: Diagnosis not present

## 2023-05-27 DIAGNOSIS — I110 Hypertensive heart disease with heart failure: Secondary | ICD-10-CM | POA: Diagnosis not present

## 2023-05-27 LAB — COMPREHENSIVE METABOLIC PANEL
ALT: 20 U/L (ref 0–44)
AST: 27 U/L (ref 15–41)
Albumin: 3.3 g/dL — ABNORMAL LOW (ref 3.5–5.0)
Alkaline Phosphatase: 71 U/L (ref 38–126)
Anion gap: 8 (ref 5–15)
BUN: 36 mg/dL — ABNORMAL HIGH (ref 8–23)
CO2: 27 mmol/L (ref 22–32)
Calcium: 8.7 mg/dL — ABNORMAL LOW (ref 8.9–10.3)
Chloride: 95 mmol/L — ABNORMAL LOW (ref 98–111)
Creatinine, Ser: 1.02 mg/dL — ABNORMAL HIGH (ref 0.44–1.00)
GFR, Estimated: 51 mL/min — ABNORMAL LOW (ref >60)
Glucose, Bld: 113 mg/dL — ABNORMAL HIGH (ref 70–99)
Potassium: 4.7 mmol/L (ref 3.5–5.1)
Sodium: 130 mmol/L — ABNORMAL LOW (ref 135–145)
Total Bilirubin: 0.9 mg/dL (ref 0.3–1.2)
Total Protein: 6.6 g/dL (ref 6.5–8.1)

## 2023-05-27 LAB — BRAIN NATRIURETIC PEPTIDE: B Natriuretic Peptide: 603.4 pg/mL — ABNORMAL HIGH (ref 0.0–100.0)

## 2023-05-27 LAB — CBC
HCT: 35.8 % — ABNORMAL LOW (ref 36.0–46.0)
Hemoglobin: 11.8 g/dL — ABNORMAL LOW (ref 12.0–15.0)
MCH: 32.8 pg (ref 26.0–34.0)
MCHC: 33 g/dL (ref 30.0–36.0)
MCV: 99.4 fL (ref 80.0–100.0)
Platelets: 243 10*3/uL (ref 150–400)
RBC: 3.6 MIL/uL — ABNORMAL LOW (ref 3.87–5.11)
RDW: 13.9 % (ref 11.5–15.5)
WBC: 6.6 10*3/uL (ref 4.0–10.5)
nRBC: 0 % (ref 0.0–0.2)

## 2023-05-27 MED ORDER — METOLAZONE 2.5 MG PO TABS: 2.5000 mg | ORAL_TABLET | Freq: Every day | ORAL | 0 refills | Status: DC

## 2023-05-27 NOTE — Progress Notes (Addendum)
[place], [name] ([id number]) [id number] Page 1 of 8 Visit Report for 05/27/2023 Chief Complaint Document Details Patient Name: Date of Service: [name], [name] 05/27/2023 12:15 PM Medical Record Number: [id number] Patient Account Number: [id number] Date of Birth/Sex: Treating RN: 06/05/1925 (89 y.o. [name] Primary Care Provider: Merita Norton Other Clinician: Referring Provider: Treating Provider/Extender: Dory Larsen in Treatment: 4 Information Obtained from: Patient Chief Complaint Left LE ulcer Electronic Signature(s) Signed: 05/27/2023 12:37:31 PM By: Allen Derry PA-C Entered By: Allen Derry on 05/27/2023 12:37:31 -------------------------------------------------------------------------------- Debridement Details Patient Name: Date of Service: [name], [name] 05/27/2023 12:15 PM Medical Record Number: [id number] Patient Account Number: [id number] Date of Birth/Sex: Treating RN: [birth year] (89 y.o. [name] Primary Care Provider: Merita Norton Other Clinician: Referring Provider: Treating Provider/Extender: Dory Larsen in Treatment: 4 Debridement Performed for Assessment: Wound #3 Left Lower Leg Performed By: Physician Allen Derry, PA-C Debridement Type: Chemical/Enzymatic/Mechanical Agent Used: saline gauze Level of Consciousness (Pre-procedure): Awake and Alert Pre-procedure Verification/Time Out Yes - 12:55 Taken: Start Time: 12:55 Pain Control: Lidocaine 4% Topical Solution Percent of Wound Bed Debrided: Instrument: Other : saline gauze Bleeding: None Procedural Pain: 0 Post Procedural Pain: 0 Response to Treatment: Procedure was tolerated well Level of Consciousness (Post- Awake and Alert procedure): Post Debridement Measurements of Total Wound [name] ([id number]) [id number] Page 2 of 8 Length: (cm) 0.8 Width: (cm) 0.8 Depth: (cm) 0.2 Volume: (cm)  0.101 Character of Wound/Ulcer Post Debridement: Stable Post Procedure Diagnosis Same as Pre-procedure Electronic Signature(s) Signed: 05/29/2023 4:40:27 PM By: Midge Aver MSN RN CNS WTA Signed: 05/29/2023 5:52:14 PM By: Allen Derry PA-C Entered By: Midge Aver on 05/27/2023 13:00:17 -------------------------------------------------------------------------------- HPI Details Patient Name: Date of Service: [name] 05/27/2023 12:15 PM Medical Record Number: [id number] Patient Account Number: [id number] Date of Birth/Sex: Treating RN: 06-01-1925 (89 y.o. [name] Primary Care Provider: Merita Norton Other Clinician: Referring Provider: Treating Provider/Extender: Dory Larsen in Treatment: 4 History of Present Illness HPI Description: 03-19-2022 upon evaluation today patient appears to be doing poorly in regard to the left posterior lower extremity ulcer. This is something that she actually struck on a car door getting out of the car and this was around January 18, 2022. She was going to urgent care due to her leg swelling when this occurred. Since that time she has had a tremendous amount of bleeding she also did have recommendations for medicated gauze to be used by urgent care. Initially she actually apparently had sutures placed over this area but then when the sutures were removed this dehisced to some degree. She was placed on Omnicef in the past there is no signs of infection right now but again that is definitely something we will need to keep a close eye on especially with the depth of the wound. The good news that she does not seem to be having any pain which is excellent. And again right now they have been using medicated gauze packing into the wound. She did have an x-ray on February 03, 2022 which was negative for any signs of bone abnormality which is great news. Patient does have a history of chronic venous insufficiency/hypertension with the lower  extremity ulcer now at this point. 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 03-25-2022 upon evaluation today patient appears to be doing better in regard to the wound on her leg. She has been  tolerating the dressing changes without complication. Fortunately I do not see any signs of active infection locally or systemically at this time which is great news. 04-01-2022 upon evaluation today patient appears to be doing well with regard to her right leg. Fortunately there does not appear to be any evidence of active infection locally nor systemically which is great news and overall I am extremely pleased with where we stand today. I do believe that she is making progress there is still some need for sharp debridement at this point. 04-08-2022 upon evaluation today patient actually appears to be making excellent progress. I am actually very pleased with where we stand today. I do not see any signs of active infection locally or systemically which is great news. No fevers, chills, nausea, vomiting, or diarrhea. 5/8 using endoform which was started last week. Wound appears smaller with less depth. 04-22-2022 upon evaluation today patient appears to be doing well in regard to a large portion of her wound although there is one area that does have me concerned about the possibility of skin cancer. I do think that we need to take a sample biopsy to send for evaluation at this point. Patient is in agreement with that plan. Her family member who is with her states that she is also prone to skin cancers she has had 1 recently removed at the skin surgery center in [place]. They did give [place] the number to that location if indeed we need to end up making a referral. For now we will get a see how things go and what the biopsy shows initially. 04-29-2022 upon evaluation today patient appears to be doing well currently in regard to  her wound in fact it actually appears to be a lot better especially after last week's biopsy. Fortunately there does not appear to be any signs of active infection locally or systemically at this time which is great news. No fevers, chills, nausea, vomiting, or diarrhea. Upon inspection the patient actually showed signs of complete resolution based on what we are seeing currently. Fortunately I do not see any evidence of infection which is great news she does have an area that is distal to where she had the original wound and this is opened today and appears to be an area of irritation unfortunately. Fortunately there does not appear to be any signs of active infection locally or systemically which is great news. Her appointment is July 14 for the skin surgery center.Marland Kitchen 05-27-2022 upon evaluation today patient appears to be doing well currently in regard to her wound in fact this appears to be completely healed which is great news. I see nothing open including the area where she had the skin cancer this is actually completely healed. In fact I cannot even tell personally where this was biopsied previously. Readmission: [name], [name] ([id number]) [id number] Page 3 of 8 04-29-2023 upon evaluation today patient appears to be doing somewhat poorly in regard to her wound over her left anterior lower extremity. She has been tolerating the dressing changes she unfortunately had a injury where she fell and had a contusion that occurred with a skin tear on 04-08-2023. Since that time she has been having ongoing issues with this not really improving as well and as quickly as they would like to see. Fortunately there does not appear to be any signs of active infection at this time which is great news and overall I am extremely pleased with where she stands. Patient does have a history of hypertension,  atrial fibrillation for which she is on anticoagulant therapy, and congestive heart  failure. 05-09-2023 upon evaluation today patient appears to be doing excellent in regard to her wound which is actually showing signs of good improvement I am much happier with where things stand today compared to previous. 05-19-2023 upon evaluation today patient appears to be doing well currently in regard to her wound. She has been tolerating the dressing changes without complication. In fact I really feel like we are showing signs of excellent improvement at this point. I see no signs of infection which is great news. 05-27-2023 upon evaluation today patient appears to be doing well currently in regard to her wound. She has been tolerating the dressing changes without complication. Fortunately there does not appear to be any signs of infection actually think that she is making really good progress here towards healing. I think we are going to switch over to the collagen today. Electronic Signature(s) Signed: 05/27/2023 1:28:27 PM By: Allen Derry PA-C Entered By: Allen Derry on 05/27/2023 13:28:27 -------------------------------------------------------------------------------- Physical Exam Details Patient Name: Date of Service: [name], [name] 05/27/2023 12:15 PM Medical Record Number: [id number] Patient Account Number: [id number] Date of Birth/Sex: Treating RN: 05/20/1925 (89 y.o. [name] Primary Care Provider: Merita Norton Other Clinician: Referring Provider: Treating Provider/Extender: Dory Larsen in Treatment: 4 Constitutional Well-nourished and well-hydrated in no acute distress. Respiratory normal breathing without difficulty. Psychiatric this patient is able to make decisions and demonstrates good insight into disease process. Alert and Oriented x 3. pleasant and cooperative. Notes Upon inspection patient's wound bed actually showed signs of good granulation epithelization at this point. Overall I am extremely pleased with where we stand I do believe that  she is making good progress and I think that the overall appearance of the wound is dramatically improved the [employer] has done a great job.. Electronic Signature(s) Signed: 05/27/2023 1:29:34 PM By: Allen Derry PA-C Entered By: Allen Derry on 05/27/2023 13:29:34 Physician Orders Details -------------------------------------------------------------------------------- [name] ([id number]) [id number] Page 4 of 8 Patient Name: Date of Service: [name], [name] 05/27/2023 12:15 PM Medical Record Number: [id number] Patient Account Number: [id number] Date of Birth/Sex: Treating RN: 1925-12-05 (89 y.o. 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 Bathing/ Shower/ Hygiene May shower; gently cleanse wound with antibacterial soap, rinse and pat dry prior to dressing wounds Anesthetic (Use 'Patient Medications' Section for Anesthetic Order Entry) Lidocaine applied to wound bed Edema Control - Lymphedema / Segmental Compressive Device / Other Elevate, Exercise Daily and A void Standing for Long Periods of Time. Elevate legs to the level of the heart and pump ankles as often as possible Elevate leg(s) parallel to the floor when sitting. Wound Treatment Wound #3 - Lower Leg Wound Laterality: Left Cleanser: Byram Ancillary Kit - 15 Day Supply (DME) (Generic) 3 x Per Week/30  Days Discharge Instructions: Use supplies as instructed; Kit contains: (15) Saline Bullets; (15) 3x3 Gauze; 15 pr Gloves Prim Dressing: Prisma 4.34 (in) (DME) (Generic) 3 x Per Week/30 Days ary Discharge  Instructions: Moisten w/normal saline or sterile water; Cover wound as directed. Do not remove from wound bed. Secondary Dressing: (BORDER) Zetuvit Plus SILICONE BORDER Dressing 4x4 (in/in) (DME) (Generic) 3 x Per Week/30 Days Discharge Instructions: Please do not put silicone bordered dressings under wraps. Use non-bordered dressing only. Electronic Signature(s) Signed: 05/29/2023 4:40:27 PM By: Midge Aver MSN RN CNS WTA Signed: 05/29/2023 5:52:14 PM By: Allen Derry PA-C Entered By: Midge Aver on 05/27/2023 13:01:27 -------------------------------------------------------------------------------- Problem List Details Patient Name: Date of Service: [name], [name] 05/27/2023 12:15 PM Medical Record Number: [id number] Patient Account Number: [id number] Date of Birth/Sex: Treating RN: Mar 14, 1925 (89 y.o. [name] Primary Care Provider: Merita Norton Other Clinician: Referring Provider: Treating Provider/Extender: Dory Larsen in Treatment: 4 [place], [name] ([id number]) [id number] Page 5 of 8 Active Problems ICD-10 Encounter Code Description Active Date MDM Diagnosis S80.12XA Contusion of left lower leg, initial encounter 04/29/2023 No Yes L97.822 Non-pressure chronic ulcer of other part of left lower leg with fat layer exposed5/21/2024 No Yes I50.42 Chronic combined systolic (congestive) and diastolic (congestive) heart failure 04/29/2023 No Yes I10 Essential (primary) hypertension 04/29/2023 No Yes I48.0 Paroxysmal atrial fibrillation 04/29/2023 No Yes Z79.01 Long term (current) use of anticoagulants 04/29/2023 No Yes Inactive Problems Resolved Problems Electronic Signature(s) Signed: 05/27/2023 12:37:28 PM By: Allen Derry PA-C Entered  By: Allen Derry on 05/27/2023 12:37:28 -------------------------------------------------------------------------------- Progress Note Details Patient Name: Date of Service: [name] 05/27/2023 12:15 PM Medical Record Number: [id number] Patient Account Number: [id number] Date of Birth/Sex: Treating RN: [birth year] (89 y.o. [name] Primary Care Provider: Merita Norton Other Clinician: Referring Provider: Treating Provider/Extender: Dory Larsen in Treatment: 4 Subjective Chief Complaint Information obtained from Patient Left LE ulcer History of Present Illness (HPI) 03-19-2022 upon evaluation today patient appears to be doing poorly in regard to the left posterior lower extremity ulcer. This is something that she actually struck on a car door getting out of the car and this was around January 18, 2022. She was going to urgent care due to her leg swelling when this occurred. Since that time she has had a tremendous amount of bleeding she also did have recommendations for medicated gauze to be used by urgent care. Initially she actually apparently had sutures placed over this area but then when the sutures were removed this dehisced to some degree. She was placed on Omnicef in the past there is no signs of infection right now but again that is definitely something we will need to keep a close eye on especially with the depth of the wound. The good news that she does not seem to be having any pain which is excellent. And again right now they have been using medicated gauze packing into the wound. She did have an x-ray on February 03, 2022 which was negative for any signs of bone abnormality which is great news. Patient does have a history of chronic venous insufficiency/hypertension with the lower extremity ulcer now at this point. 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 03-25-2022 upon evaluation today patient appears to be doing better in regard to the wound on her leg. She has been tolerating the dressing changes without complication. Fortunately I do not see any signs of active infection locally or systemically at this time which is great news. 04-01-2022 upon evaluation today patient appears to be doing well  with regard to her right leg. Fortunately there does not appear to be any evidence of active infection locally nor systemically which is great news and overall I am extremely pleased with where we stand today. I do believe that she is making progress there is still some need for sharp debridement at this point. 04-08-2022 upon evaluation today patient actually appears to be making excellent progress. I am actually very pleased with where we stand today. I do not see any signs of active infection locally or systemically which is great news. No fevers, chills, nausea, vomiting, or diarrhea. 5/8 using endoform which was started last week. Wound appears smaller with less depth. 04-22-2022 upon evaluation today patient appears to be doing well in regard to a large portion of her wound although there is one area that does have me concerned about the possibility of skin cancer. I do think that we need to take a sample biopsy to send for evaluation at this point. Patient is in agreement with that plan. Her family member who is with her states that she is also prone to skin cancers she has had 1 recently removed at the skin surgery center in [place]. They did give [place] the number to that location if indeed we need to end up making a referral. For now we will get a see how things go and what the biopsy shows initially. 04-29-2022 upon evaluation today patient appears to be doing well currently in regard to her wound in fact it actually appears to be a lot better especially after last week's biopsy.  Fortunately there does not appear to be any signs of active infection locally or systemically at this time which is great news. No fevers, chills, nausea, vomiting, or diarrhea. Upon inspection the patient actually showed signs of complete resolution based on what we are seeing currently. Fortunately I do not see any evidence of infection which is great news she does have an area that is distal to where she had the original wound and this is opened today and appears to be an area of irritation unfortunately. Fortunately there does not appear to be any signs of active infection locally or systemically which is great news. Her appointment is July 14 for the skin surgery center.Marland Kitchen 05-27-2022 upon evaluation today patient appears to be doing well currently in regard to her wound in fact this appears to be completely healed which is great news. I see nothing open including the area where she had the skin cancer this is actually completely healed. In fact I cannot even tell personally where this was biopsied previously. Readmission: 04-29-2023 upon evaluation today patient appears to be doing somewhat poorly in regard to her wound over her left anterior lower extremity. She has been tolerating the dressing changes she unfortunately had a injury where she fell and had a contusion that occurred with a skin tear on 04-08-2023. Since that time she has been having ongoing issues with this not really improving as well and as quickly as they would like to see. Fortunately there does not appear to be any signs of active infection at this time which is great news and overall I am extremely pleased with where she stands. Patient does have a history of hypertension, atrial fibrillation for which she is on anticoagulant therapy, and congestive heart failure. 05-09-2023 upon evaluation today patient appears to be doing excellent in regard to her wound which is actually showing signs of good improvement I am much happier  with where things  stand today compared to previous. 05-19-2023 upon evaluation today patient appears to be doing well currently in regard to her wound. She has been tolerating the dressing changes without complication. In fact I really feel like we are showing signs of excellent improvement at this point. I see no signs of infection which is great news. 05-27-2023 upon evaluation today patient appears to be doing well currently in regard to her wound. She has been tolerating the dressing changes without complication. Fortunately there does not appear to be any signs of infection actually think that she is making really good progress here towards healing. I think we are going to switch over to the collagen today. Objective Constitutional Well-nourished and well-hydrated in no acute distress. Vitals Time Taken: 12:23 PM, Height: 59 in, Weight: 116 lbs, BMI: 23.4, Temperature: 97.8 F, Pulse: 90 bpm, Respiratory Rate: 18 breaths/min, Blood Pressure: 120/75 mmHg. Respiratory normal breathing without difficulty. Psychiatric this patient is able to make decisions and demonstrates good insight into disease process. Alert and Oriented x 3. pleasant and cooperative. General Notes: Upon inspection patient's wound bed actually showed signs of good granulation epithelization at this point. Overall I am extremely pleased with where we stand I do believe that she is making good progress and I think that the overall appearance of the wound is dramatically improved the [employer] has done a great job.. Integumentary (Hair, Skin) Wound #3 status is Open. Original cause of wound was Trauma. The date acquired was: 04/08/2023. The wound has been in treatment 4 weeks. The wound is located on the Left Lower Leg. The wound measures 0.8cm length x 0.8cm width x 0.2cm depth; 0.503cm^2 area and 0.101cm^3 volume. There is Fat Layer (Subcutaneous Tissue) exposed. There is a medium amount of serosanguineous drainage noted. There  is medium (34-66%) granulation within the wound bed. There is a medium (34-66%) amount of necrotic tissue within the wound bed including Adherent Slough. 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 There was a Chemical/Enzymatic/Mechanical debridement performed by ear Allen Derry, PA-C. With the following instrument(s): saline gauze after achieving pain control using Lidocaine 4% Topical Solution. Other agent used was saline gauze. A time out was conducted at 12:55, prior to the start of the procedure. There was no bleeding. The procedure was tolerated well with a pain level of 0 throughout and a pain level of 0 following the procedure. Post Debridement Measurements: 0.8cm length x 0.8cm width x 0.2cm depth; 0.101cm^3 volume. Character of Wound/Ulcer Post Debridement is stable. Post procedure Diagnosis Wound #3: Same as Pre-Procedure Plan Follow-up Appointments: Return Appointment in 1 week. Bathing/ Shower/ Hygiene: May shower; gently cleanse wound with antibacterial soap, rinse and pat dry prior to dressing wounds Anesthetic (Use 'Patient Medications' Section for Anesthetic Order Entry): Lidocaine applied to wound bed Edema Control - Lymphedema / Segmental Compressive Device / Other: Elevate, Exercise Daily and Avoid Standing for Long Periods of Time. Elevate legs to the level of the heart and pump ankles as often as possible Elevate leg(s) parallel to the floor when sitting. WOUND #3: - Lower Leg Wound  Laterality: Left Cleanser: Byram Ancillary Kit -  15 Day Supply (DME) (Generic) 3 x Per Week/30 Days Discharge Instructions: Use supplies as instructed; Kit contains: (15) Saline Bullets; (15) 3x3 Gauze; 15 pr Gloves Prim Dressing: Prisma 4.34 (in) (DME) (Generic) 3 x Per Week/30 Days ary Discharge Instructions: Moisten w/normal saline or sterile water; Cover wound as directed. Do not remove from wound bed. Secondary Dressing: (BORDER) Zetuvit Plus SILICONE BORDER Dressing 4x4 (in/in) (DME) (Generic) 3 x Per Week/30 Days Discharge Instructions: Please do not put silicone bordered dressings under wraps. Use non-bordered dressing only. 1. Based on what I am seeing I do believe that the patient is showing signs of good improvement. We are going to switch over to silver collagen at this point which should do quite well. 2. I am good recommend as well that patient should continue to monitor for any signs of infection or worsening. Office if anything changes she knows contact the office and let me know. We will see patient back for reevaluation in 1 week here in the clinic. If anything worsens or changes patient will contact our office for additional recommendations. Electronic Signature(s) Signed: 05/27/2023 1:33:07 PM By: Allen Derry PA-C Entered By: Allen Derry on 05/27/2023 13:33:06 -------------------------------------------------------------------------------- SuperBill Details Patient Name: Date of Service: [name], [name] 05/27/2023 Medical Record Number: [id number] Patient Account Number: [id number] [name], BOBST ([id number]) [id number] Page 8 of 8 Date of Birth/Sex: Treating RN: 07/09/1925 (89 y.o. 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 I10 Essential (primary) hypertension I48.0 Paroxysmal atrial fibrillation Z79.01 Long term (current) use of anticoagulants Facility Procedures : CPT4 Code: [id number] Description: [phone] - DEBRIDE W/O ANES NON SELECT Modifier: Quantity: 1 Physician Procedures : CPT4 Code Description Modifier [id number] - WC PHYS LEVEL 4 - EST PT ICD-10 Diagnosis Description S80.12XA Contusion of left lower leg, initial encounter L97.822 Non-pressure chronic ulcer of other part of left lower leg with fat layer exposed  I50.42 Chronic combined systolic (congestive) and diastolic (congestive) heart failure I10 Essential (primary) hypertension Quantity: 1 Electronic Signature(s) Signed: 05/27/2023 1:35:07 PM By: Allen Derry PA-C Entered By: Allen Derry on 05/27/2023 13:35:06

## 2023-05-27 NOTE — Progress Notes (Unsigned)
Cardiology Office Note:    Date:  05/27/2023   ID:  [name], DOB [birth year], MRN [id number]  PCP:  [name], FNP  [facility] Cardiologist:  None  [facility] Electrophysiologist:  Lanier Prude, MD   Referring MD: [name], FNP   Chief Complaint: 1 year follow-up  History of Present Illness:    [name] is a 89 y.o. 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   Pacemaker implanted in 2007 with generator changeout in 2015. She is not anticoagulated given history of macular degeneration and falls. The patient follows with EP.   H/o CHF. Echo in 2020 showed normal LVEF, indeterminate diastolic dysfunction.   She was last seen in 2023 and was stable from a cardiac perspective. She has also followed with Clarisa [name], last seen 02/2023.  Today, the patient reports 5lbs weight gain. She has wheezing and coughing. No fever or chills. She normally walks a 1/2 hour daily, but has not been able to do this. Legs feel heavy. She has chronic lower leg edema, but feels it has been worse. Baseline weight is 112lbs. Has been 117lbs. She was taking torsemide 20mg  daily, however she has been taking 40mg  for the last 5 days.   Past Medical History:  Diagnosis Date   Arrhythmia    atrial fibrillation   Congestive heart failure (CHF) (HCC)    Hypertension    Pacemaker 2015    Past Surgical History:  Procedure Laterality Date   ABDOMINAL HYSTERECTOMY     EYE SURGERY     MOHS SURGERY  2023   PACEMAKER PLACEMENT N/A 2007   2017    Current Medications: No outpatient medications have been marked as taking for the 05/27/23 encounter (Appointment) with Fransico Michael, Treyvion Durkee H, PA-C.     Allergies:   Moxifloxacin, Tobramycin, and Codeine   Social History   Socioeconomic History   Marital status: Widowed    Spouse name: Not on file   Number of children: Not on file   Years of education: Not on  file   Highest education level: Not on file  Occupational History   Not on file  Tobacco Use   Smoking status: Never   Smokeless tobacco: Never  Substance and Sexual Activity   Alcohol use: Not Currently   Drug use: Never   Sexual activity: Not on file  Other Topics Concern   Not on file  Social History Narrative   Not on file   Social Determinants of Health   Financial Resource Strain: Low Risk  (07/04/2022)   Overall Financial Resource Strain (CARDIA)    Difficulty of Paying Living Expenses: Not hard at all  Food Insecurity: No Food Insecurity (07/04/2022)   Hunger Vital Sign    Worried About Running Out of Food in the Last Year: Never true    Ran Out of Food in the Last Year: Never true  Transportation Needs: No Transportation Needs (07/04/2022)   PRAPARE - Administrator, Civil Service (Medical): No    Lack of Transportation (Non-Medical): No  Physical Activity: Insufficiently Active (07/04/2022)   Exercise Vital Sign    Days of Exercise per Week: 2 days    Minutes of Exercise per Session: 20 min  Stress: No Stress Concern Present (07/04/2022)   [employer] of Occupational Health - Occupational Stress Questionnaire    Feeling of Stress : Not at all  Social  Connections: Moderately Isolated (07/04/2022)   Social Connection and Isolation Panel [NHANES]    Frequency of Communication with Friends and Family: More than three times a week    Frequency of Social Gatherings with Friends and Family: More than three times a week    Attends Religious Services: More than 4 times per year    Active Member of [employer] or Organizations: No    Attends Banker Meetings: Never    Marital Status: Widowed     Family History: The patient's family history includes Heart attack in her daughter and father; Heart failure in her mother.  ROS:   Please see the history of present illness.     All other systems reviewed and are negative.  EKGs/Labs/Other Studies  Reviewed:    The following studies were reviewed today:  Echo 02/2022  1. Left ventricular ejection fraction, by estimation, is 60 to 65%. The  left ventricle has normal function. The left ventricle has no regional  wall motion abnormalities. There is mild left ventricular hypertrophy.  Left ventricular diastolic parameters  are indeterminate.   2. Right ventricular systolic function is normal. The right ventricular  size is normal. There is moderately elevated pulmonary artery systolic  pressure.   3. Left atrial size was severely dilated.   4. Right atrial size was severely dilated.   5. The mitral valve is degenerative. Moderate mitral valve regurgitation.  No evidence of mitral stenosis. Moderate to severe mitral annular  calcification.   6. Tricuspid valve regurgitation is moderate to severe.   7. The aortic valve has an indeterminant number of cusps. There is  moderate calcification of the aortic valve. There is severe thickening of  the aortic valve. Aortic valve regurgitation is not visualized. There is  mild to moderate aortic valve stenosis.   Aortic valve area, by VTI measures 1.18 cm. Aortic valve mean gradient  measures 7.0 mmHg. Dimensionless index is 0.46.   8. The inferior vena cava is dilated in size with <50% respiratory  variability, suggesting right atrial pressure of 15 mmHg.   EKG:  EKG is  ordered today.  The ekg ordered today demonstrates V paced rhythm, 61bpm  Recent Labs: 04/08/2023: ALT 20; BUN 41; Creatinine, Ser 0.87; Hemoglobin 12.0; Platelets 157; Potassium 4.3; Sodium 136  Recent Lipid Panel No results found for: "CHOL", "TRIG", "HDL", "CHOLHDL", "VLDL", "LDLCALC", "LDLDIRECT"   Risk Assessment/Calculations:   {Does this patient have ATRIAL FIBRILLATION?[phone]}   Physical Exam:    VS:  LMP  (LMP Unknown)     Wt Readings from Last 3 Encounters:  05/16/23 115 lb (52.2 kg)  04/17/23 115 lb 3.2 oz (52.3 kg)  02/18/23 113 lb 8 oz (51.5 kg)      GEN: *** Well nourished, well developed in no acute distress HEENT: Normal NECK: No JVD; No carotid bruits LYMPHATICS: No lymphadenopathy CARDIAC: ***RRR, no murmurs, rubs, gallops RESPIRATORY:  Clear to auscultation without rales, wheezing or rhonchi  ABDOMEN: Soft, non-tender, non-distended MUSCULOSKELETAL:  No edema; No deformity  SKIN: Warm and dry NEUROLOGIC:  Alert and oriented x 3 PSYCHIATRIC:  Normal affect   ASSESSMENT:    No diagnosis found. PLAN:    In order of problems listed above:  Acute on chronic diastolic heart failure Patient has chronic lower leg edema on Torsemide. She has been on Torsemide 20mg  daily, but increased to Torsemide 40mg  daily for the last 5 days without improvement. She reports 5lbs weight gain 112>117lbs. On exam, she is volume up.  I will add metolazone 2.5mg  to take for 2 days along with Torsemide. I will check a BNP, CMET, and CBC. Suspect some lower leg edema is from   Murmur Echo as above  Disposition: Follow up {follow up:15908} with ***   Shared Decision Making/Informed Consent   {Are you ordering a CV Procedure (e.g. stress test, cath, DCCV, TEE, etc)?   Press F2        :[id number]}    Signed, Tijuana Scheidegger David Stall, PA-C  05/27/2023 1:25 PM    [facility]

## 2023-05-27 NOTE — Patient Instructions (Signed)
Medication Instructions:  TAKE Metolazone 2.5 mg 30 minutes prior to Torsemide for 2 days. Then STOP Metolazone  *If you need a refill on your cardiac medications before your next appointment, please call your pharmacy*   Lab Work: Your provider would like for you to have following labs drawn: (CBC, CMP, BNP).   Please go to the [facility] entrance and check in at the front desk.  You do not need an appointment.  They are open from 7am-6 pm.   If you have labs (blood work) drawn today and your tests are completely normal, you will receive your results only by: MyChart Message (if you have MyChart) OR A paper copy in the mail If you have any lab test that is abnormal or we need to change your treatment, we will call you to review the results.   Testing/Procedures: Your physician has requested that you have an echocardiogram. Echocardiography is a painless test that uses sound waves to create images of your heart. It provides your doctor with information about the size and shape of your heart and how well your heart's chambers and valves are working.   You may receive an ultrasound enhancing agent through an IV if needed to better visualize your heart during the echo. This procedure takes approximately one hour.  There are no restrictions for this procedure.  This will take place at [address] [address] [facility], [place] [address]    Follow-Up: At [facility], you and your health needs are our priority.  As part of our continuing mission to provide you with exceptional heart care, we have created designated Provider Care Teams.  These Care Teams include your primary Cardiologist (physician) and Advanced Practice Providers (APPs -  Physician Assistants and Nurse Practitioners) who all work together to provide you with the care you need, when you need it.  We recommend signing up for the patient portal called "MyChart".  Sign up information is provided  on this After Visit Summary.  MyChart is used to connect with patients for Virtual Visits (Telemedicine).  Patients are able to view lab/test results, encounter notes, upcoming appointments, etc.  Non-urgent messages can be sent to your provider as well.   To learn more about what you can do with MyChart, go to [email or web address].    Your next appointment:   1-2 week(s)  Provider:   Terrilee Croak, PA-C

## 2023-05-28 ENCOUNTER — Ambulatory Visit (INDEPENDENT_AMBULATORY_CARE_PROVIDER_SITE_OTHER): Payer: Medicare PPO

## 2023-05-28 DIAGNOSIS — S8012XA Contusion of left lower leg, initial encounter: Secondary | ICD-10-CM | POA: Diagnosis not present

## 2023-05-28 DIAGNOSIS — L97822 Non-pressure chronic ulcer of other part of left lower leg with fat layer exposed: Secondary | ICD-10-CM | POA: Diagnosis not present

## 2023-05-28 DIAGNOSIS — I480 Paroxysmal atrial fibrillation: Secondary | ICD-10-CM | POA: Diagnosis not present

## 2023-05-28 DIAGNOSIS — I10 Essential (primary) hypertension: Secondary | ICD-10-CM | POA: Diagnosis not present

## 2023-05-28 DIAGNOSIS — I5042 Chronic combined systolic (congestive) and diastolic (congestive) heart failure: Secondary | ICD-10-CM | POA: Diagnosis not present

## 2023-05-28 DIAGNOSIS — I4821 Permanent atrial fibrillation: Secondary | ICD-10-CM | POA: Diagnosis not present

## 2023-05-28 NOTE — Telephone Encounter (Signed)
Requested Prescriptions  Pending Prescriptions Disp Refills   dipyridamole-aspirin (AGGRENOX) 200-25 MG 12hr capsule [Pharmacy Med Name: ASPIRIN-DIPYRIDAM ER 25-200 MG] 120 capsule 2    Sig: TAKE 1 CAPSULE BY MOUTH TWICE A DAY     Hematology:  Antiplatelets - aspirin / dipyridamole Passed - 05/27/2023  5:52 PM      Passed - Cr in normal range and within 360 days    Creatinine, Ser  Date Value Ref Range Status  05/27/2023 1.02 (H) 0.44 - 1.00 mg/dL Final         Passed - eGFR is 10 or above and within 360 days    GFR, Estimated  Date Value Ref Range Status  05/27/2023 51 (L) >60 mL/min Final    Comment:    (NOTE) Calculated using the CKD-EPI Creatinine Equation (2021)    eGFR  Date Value Ref Range Status  09/26/2022 54 (L) >59 mL/min/1.73 Final         Passed - Patient is not pregnant      Passed - Valid encounter within last 12 months    Recent Outpatient Visits           1 week ago Viral URI with cough   [facility] Merita Norton T, FNP   1 month ago Skin tear of left lower leg without complication, sequela   [facility] [name], FNP   8 months ago Annual physical exam   [facility] [name], FNP   1 year ago Cough, unspecified type   [facility] Malva Limes, MD   1 year ago Irritability   [facility] [place], [employer], PA-C       Future Appointments             In 2 weeks Fransico Michael, Cadence H, PA-C [facility] [place]

## 2023-05-29 NOTE — Progress Notes (Signed)
[place], [name] ([id number]) [id number] Page 1 of 9 Visit Report for 05/27/2023 Arrival Information Details Patient Name: Date of Service: [name], [name] 05/27/2023 12:15 PM Medical Record Number: [id number] Patient Account Number: [id number] Date of Birth/Sex: Treating RN: 04-28-1925 (89 y.o. [name] Primary Care [name]: [name] Other Clinician: Referring [name]: Treating [name]/Extender: [name] in Treatment: 4 Visit Information History Since Last Visit Added or deleted any medications: No Patient Arrived: [name] Has Dressing in Place as Prescribed: Yes Arrival Time: 12:19 Pain Present Now: No Accompanied By: daughter Transfer Assistance: None Patient Identification Verified: Yes Secondary Verification Process Completed: Yes Patient Requires Transmission-Based Precautions: No Patient Has Alerts: Yes Patient Alerts: Patient on Blood Thinner non compressible Electronic Signature(s) Signed: 05/29/2023 4:40:27 PM By: Midge Aver MSN RN CNS WTA Entered By: Midge Aver on 05/27/2023 12:23:55 -------------------------------------------------------------------------------- Clinic Level of Care Assessment Details Patient Name: Date of Service: [name] [facility] 05/27/2023 12:15 PM Medical Record Number: [id number] Patient Account Number: [id number] Date of Birth/Sex: Treating RN: September 12, 1925 (89 y.o. [name] Primary Care [name]: [name] Other Clinician: Referring [name]: Treating [name]/Extender: [name] in Treatment: 4 Clinic Level of Care Assessment Items TOOL 1 Quantity Score []  - 0 Use when EandM and Procedure is performed on INITIAL visit ASSESSMENTS - Nursing Assessment / Reassessment []  - 0 General Physical Exam (combine w/ comprehensive assessment (listed just below) when performed on new pt. evals) []  - 0 Comprehensive Assessment (HX, ROS, Risk Assessments, Wounds Hx,  etc.) ASSESSMENTS - Wound and Skin Assessment / Reassessment []  - 0 Dermatologic / Skin Assessment (not related to wound area) [name] ([id number]) [id number] Page 2 of 9 ASSESSMENTS - Ostomy and/or Continence Assessment and Care []  - 0 Incontinence Assessment and Management []  - 0 Ostomy Care Assessment and Management (repouching, etc.) PROCESS - Coordination of Care []  - 0 Simple Patient / Family Education for ongoing care []  - 0 Complex (extensive) Patient / Family Education for ongoing care []  - 0 Staff obtains Chiropractor, Records, T Results / Process Orders est []  - 0 Staff telephones HHA, Nursing Homes / Clarify orders / etc []  - 0 Routine Transfer to another Facility (non-emergent condition) []  - 0 Routine Hospital Admission (non-emergent condition) []  - 0 New Admissions / Manufacturing engineer / Ordering NPWT Apligraf, etc. , []  - 0 Emergency Hospital Admission (emergent condition) PROCESS - Special Needs []  - 0 Pediatric / Minor Patient Management []  - 0 Isolation Patient Management []  - 0 Hearing / Language / Visual special needs []  - 0 Assessment of Community assistance (transportation, D/C planning, etc.) []  - 0 Additional assistance / Altered mentation []  - 0 Support Surface(s) Assessment (bed, cushion, seat, etc.) INTERVENTIONS - Miscellaneous []  - 0 External ear exam []  - 0 Patient Transfer (multiple staff / Nurse, adult / Similar devices) []  - 0 Simple Staple / Suture removal (25 or less) []  - 0 Complex Staple / Suture removal (26 or more) []  - 0 Hypo/Hyperglycemic Management (do not check if billed separately) []  - 0 Ankle / Brachial Index (ABI) - do not check if billed separately Has the patient been seen at the hospital within the last three years: Yes Total Score: 0 Level Of Care: ____ Electronic Signature(s) Signed: 05/29/2023 4:40:27 PM By: Midge Aver MSN RN CNS WTA Entered By: Midge Aver on 05/27/2023  13:01:34 -------------------------------------------------------------------------------- Encounter Discharge Information Details Patient Name: Date of Service: [name] [facility] 05/27/2023 12:15 PM Medical Record Number: [id number] Patient Account  Number: [id number] Date of Birth/Sex: Treating RN: 1925-10-05 (89 y.o. [name] Primary Care [name]: [name] Other Clinician: Referring [name]: Treating [name]/Extender: [name] in Treatment: 4 Encounter Discharge Information Items Post Procedure [name] ([id number]) [id number] Page 3 of 9 Discharge Condition: Stable Temperature (F): 97.8 Ambulatory Status: [name] Pulse (bpm): 90 Discharge Destination: Home Respiratory Rate (breaths/min): 18 Transportation: Private Auto Blood Pressure (mmHg): 120/75 Accompanied By: daughter Schedule Follow-up Appointment: Yes Clinical Summary of Care: Electronic Signature(s) Signed: 05/29/2023 4:40:27 PM By: Midge Aver MSN RN CNS WTA Entered By: Midge Aver on 05/27/2023 13:03:18 -------------------------------------------------------------------------------- Lower Extremity Assessment Details Patient Name: Date of Service: [name] [facility] 05/27/2023 12:15 PM Medical Record Number: [id number] Patient Account Number: [id number] Date of Birth/Sex: Treating RN: 09/17/1925 (89 y.o. [name] Primary Care [name]: [name] Other Clinician: Referring [name]: Treating [name]/Extender: [name] in Treatment: 4 Edema Assessment Assessed: [Left: No] [Right: No] Edema: [Left: N] [Right: o] Calf Left: Right: Point of Measurement: 38 cm From Medial Instep 31 cm Ankle Left: Right: Point of Measurement: 10 cm From Medial Instep 25 cm Knee To Floor Left: Right: From Medial Instep 36 cm Vascular Assessment Pulses: Dorsalis Pedis Palpable: [Left:Yes] Electronic Signature(s) Signed: 05/29/2023  4:40:27 PM By: Midge Aver MSN RN CNS WTA Entered By: Midge Aver on 05/27/2023 12:28:47 [name] ([id number]) [id number] Page 4 of 9 -------------------------------------------------------------------------------- Multi Wound Chart Details Patient Name: Date of Service: [name], [name] 05/27/2023 12:15 PM Medical Record Number: [id number] Patient Account Number: [id number] Date of Birth/Sex: Treating RN: 08-05-1925 (89 y.o. [name] Primary Care Birgit Nowling: [name] Other Clinician: Referring Vihana Kydd: Treating Alano Blasco/Extender: [name] in Treatment: 4 Vital Signs Height(in): 59 Pulse(bpm): 90 Weight(lbs): 116 Blood Pressure(mmHg): 120/75 Body Mass Index(BMI): 23.4 Temperature(F): 97.8 Respiratory Rate(breaths/min): 18 [3:Photos:] [N/A:N/A] Left Lower Leg N/A N/A Wound Location: Trauma N/A N/A Wounding Event: Skin T ear N/A N/A Primary Etiology: Congestive Heart Failure, N/A N/A Comorbid History: Hypertension 04/08/2023 N/A N/A Date Acquired: 4 N/A N/A Weeks of Treatment: Open N/A N/A Wound Status: No N/A N/A Wound Recurrence: 0.8x0.8x0.2 N/A N/A Measurements L x W x D (cm) 0.503 N/A N/A A (cm) : rea 0.101 N/A N/A Volume (cm) : 84.00% N/A N/A % Reduction in Area: 83.90% N/A N/A % Reduction in Volume: Full Thickness Without Exposed N/A N/A Classification: Support Structures Medium N/A N/A Exudate Amount: Serosanguineous N/A N/A Exudate Type: red, brown N/A N/A Exudate Color: Medium (34-66%) N/A N/A Granulation Amount: Medium (34-66%) N/A N/A Necrotic Amount: Fat Layer (Subcutaneous Tissue): Yes N/A N/A Exposed Structures: Fascia: No Tendon: No Muscle: No Joint: No Bone: No None N/A N/A Epithelialization: Treatment Notes Electronic Signature(s) Signed: 05/29/2023 4:40:27 PM By: Midge Aver MSN RN CNS WTA Entered By: Midge Aver on 05/27/2023 12:30:20 [name] ([id number])  [id number] Page 5 of 9 -------------------------------------------------------------------------------- Multi-Disciplinary Care Plan Details Patient Name: Date of Service: [name], [name] 05/27/2023 12:15 PM Medical Record Number: [id number] Patient Account Number: [id number] Date of Birth/Sex: Treating RN: Aug 11, 1925 (89 y.o. [name] Primary Care Norell Brisbin: [name] Other Clinician: Referring Wilfred Siverson: Treating Aldon Hengst/Extender: [name] in Treatment: 4 Active Inactive Necrotic Tissue Nursing Diagnoses: Knowledge deficit related to management of necrotic/devitalized tissue Goals: Necrotic/devitalized tissue will be minimized in the wound bed Date Initiated: 04/29/2023 Target Resolution Date: 06/14/2023 Goal Status: Active Interventions: Assess patient pain level pre-, during and post procedure and prior to discharge Notes: Wound/Skin Impairment Nursing  Diagnoses: Knowledge deficit related to ulceration/compromised skin integrity Goals: Patient/caregiver will verbalize understanding of skin care regimen Date Initiated: 04/29/2023 Date Inactivated: 05/27/2023 Target Resolution Date: 05/30/2023 Goal Status: Met Ulcer/skin breakdown will have a volume reduction of 30% by week 4 Date Initiated: 04/29/2023 Date Inactivated: 05/27/2023 Target Resolution Date: 05/30/2023 Goal Status: Met Ulcer/skin breakdown will have a volume reduction of 50% by week 8 Date Initiated: 04/29/2023 Target Resolution Date: 06/29/2023 Goal Status: Active Ulcer/skin breakdown will have a volume reduction of 80% by week 12 Date Initiated: 04/29/2023 Target Resolution Date: 07/30/2023 Goal Status: Active Ulcer/skin breakdown will heal within 14 weeks Date Initiated: 04/29/2023 Target Resolution Date: 08/30/2023 Goal Status: Active Interventions: Assess patient/caregiver ability to obtain necessary supplies Assess patient/caregiver ability to perform  ulcer/skin care regimen upon admission and as needed Assess ulceration(s) every visit Notes: Electronic Signature(s) Signed: 05/29/2023 4:40:27 PM By: Midge Aver MSN RN CNS WTA Entered By: Midge Aver on 05/27/2023 13:02:05 [name] ([id number]) [id number] Page 6 of 9 -------------------------------------------------------------------------------- Pain Assessment Details Patient Name: Date of Service: [name], [name] 05/27/2023 12:15 PM Medical Record Number: [id number] Patient Account Number: [id number] Date of Birth/Sex: Treating RN: [birth year] (89 y.o. [name] Primary Care Shterna Laramee: [name] Other Clinician: Referring Loomis Anacker: Treating Christia Coaxum/Extender: [name] in Treatment: 4 Active Problems Location of Pain Severity and Description of Pain Patient Has Paino No Site Locations Pain Management and Medication Current Pain Management: Electronic Signature(s) Signed: 05/29/2023 4:40:27 PM By: Midge Aver MSN RN CNS WTA Entered By: Midge Aver on 05/27/2023 12:24:20 -------------------------------------------------------------------------------- Patient/Caregiver Education Details Patient Name: Date of Service: [name] 6/18/2024andnbsp12:15 PM Medical Record Number: [id number] Patient Account Number: [id number] Date of Birth/Gender: Treating RN: 1925/10/23 (89 y.o. [name] Primary Care Physician: [name] Other Clinician: Referring Physician: Treating Physician/Extender: [name] in Treatment: 4 [name] ([id number]) [id number] Page 7 of 9 Education Assessment Education Provided To: Patient Education Topics Provided Wound/Skin Impairment: Handouts: Caring for Your Ulcer Methods: Explain/Verbal Responses: State content correctly Electronic Signature(s) Signed: 05/29/2023 4:40:27 PM By: Midge Aver MSN RN CNS WTA Entered By: Midge Aver  on 05/27/2023 13:02:16 -------------------------------------------------------------------------------- Wound Assessment Details Patient Name: Date of Service: [name], [name] 05/27/2023 12:15 PM Medical Record Number: [id number] Patient Account Number: [id number] Date of Birth/Sex: Treating RN: 10/27/1925 (89 y.o. [name] Primary Care Braison Snoke: [name] Other Clinician: Referring Leisha Trinkle: Treating Suzan Manon/Extender: [name] in Treatment: 4 Wound Status Wound Number: 3 Primary Etiology: Skin Tear Wound Location: Left Lower Leg Wound Status: Open Wounding Event: Trauma Comorbid History: Congestive Heart Failure, Hypertension Date Acquired: 04/08/2023 Weeks Of Treatment: 4 Clustered Wound: No Photos Wound Measurements Length: (cm) 0.8 Width: (cm) 0.8 Depth: (cm) 0.2 Area: (cm) 0.503 Volume: (cm) 0.101 % Reduction in Area: 84% % Reduction in Volume: 83.9% Epithelialization: None Wound Description Classification: Full Thickness Without Exposed Support Exudate Amount: Medium Exudate Type: Serosanguineous [name], [name] ([id number]) Exudate Color: red, brown Structures Foul Odor After Cleansing: No Slough/Fibrino Yes [phone].pdf Page 8 of 9 Wound Bed Granulation Amount: Medium (34-66%) Exposed Structure Necrotic Amount: Medium (34-66%) Fascia Exposed: No Necrotic Quality: Adherent Slough Fat Layer (Subcutaneous Tissue) Exposed: Yes Tendon Exposed: No Muscle Exposed: No Joint Exposed: No Bone Exposed: No Treatment Notes Wound #3 (Lower Leg) Wound Laterality: Left Cleanser Byram Ancillary Kit - 15 Day Supply Discharge Instruction: Use supplies as instructed; Kit contains: (15) Saline Bullets; (15) 3x3 Gauze; 15 pr Gloves Peri-Wound Care Topical Primary Dressing Prisma  4.34 (in) Discharge Instruction: Moisten w/normal saline or sterile water; Cover wound as directed. Do not remove from wound bed. Secondary  Dressing (BORDER) Zetuvit Plus SILICONE BORDER Dressing 4x4 (in/in) Discharge Instruction: Please do not put silicone bordered dressings under wraps. Use non-bordered dressing only. Secured With Compression Wrap Compression Stockings Facilities manager) Signed: 05/29/2023 4:40:27 PM By: Midge Aver MSN RN CNS WTA Entered By: Midge Aver on 05/27/2023 12:27:49 -------------------------------------------------------------------------------- Vitals Details Patient Name: Date of Service: [name] [facility] 05/27/2023 12:15 PM Medical Record Number: [id number] Patient Account Number: [id number] Date of Birth/Sex: Treating RN: [birth year] (89 y.o. [name] Primary Care Kristl Morioka: [name] Other Clinician: Referring Dionysios Massman: Treating Dilan Fullenwider/Extender: [name] in Treatment: 4 Vital Signs Time Taken: 12:23 Temperature (F): 97.8 Height (in): 59 Pulse (bpm): 90 Weight (lbs): 116 Respiratory Rate (breaths/min): 18 Body Mass Index (BMI): 23.4 Blood Pressure (mmHg): 120/75 Reference Range: 80 - 120 mg / dl Electronic Signature(s) Signed: 05/29/2023 4:40:27 PM By: Midge Aver MSN RN CNS Jennette Bill, [facility] 05/29/2023 4:40:27 PM By: Midge Aver MSN RN CNS WTA Signed: ([id number]) [id number] Page 9 of 9 Entered By: Midge Aver on 05/27/2023 12:24:15

## 2023-05-30 ENCOUNTER — Telehealth: Payer: Self-pay

## 2023-05-30 LAB — CUP PACEART REMOTE DEVICE CHECK
Battery Impedance: 2565 Ohm
Battery Remaining Longevity: 29 mo
Battery Voltage: 2.75 V
Brady Statistic RV Percent Paced: 94 %
Date Time Interrogation Session: 20240621085611
Implantable Lead Connection Status: 753985
Implantable Lead Connection Status: 753985
Implantable Lead Implant Date: 20070523
Implantable Lead Implant Date: 20070523
Implantable Lead Location: 753862
Implantable Lead Location: 753862
Implantable Lead Model: 4092
Implantable Lead Model: 5076
Implantable Pulse Generator Implant Date: 20151028
Lead Channel Impedance Value: 67 Ohm
Lead Channel Impedance Value: 705 Ohm
Lead Channel Pacing Threshold Amplitude: 0.5 V
Lead Channel Pacing Threshold Pulse Width: 0.4 ms
Lead Channel Setting Pacing Amplitude: 2 V
Lead Channel Setting Pacing Pulse Width: 0.4 ms
Lead Channel Setting Sensing Sensitivity: 2.8 mV
Zone Setting Status: 755011
Zone Setting Status: 755011

## 2023-05-30 MED ORDER — METOLAZONE 2.5 MG PO TABS: 2.5000 mg | ORAL_TABLET | Freq: Every day | ORAL | 0 refills | Status: DC

## 2023-05-30 NOTE — Telephone Encounter (Signed)
Pt's daughter made aware of recommendations and verbalized understanding.   Furth, Cadence H, PA-C  You25 minutes ago (1:04 PM)    We can do metolazone 2.5 mg for two more days, until she back to baseline weight

## 2023-05-30 NOTE — Telephone Encounter (Signed)
Daughter made aware of lab results along with Kylie's recommendations. Daughter stated pt only dropped 2 lbs the first night and has since weighed 115. She also reported pt is experiencing SOB with heavy breathing even at rest.  Daughter confirmed pt is taking torsemide 20 mg daily  Will forward to [employer] for recommendations  Furth, Kylie H, PA-C  Parke Poisson, RN Labs show mild to mod volume overload. Continue current plan. May need a third dose of metolazone, depending on weight.

## 2023-06-02 ENCOUNTER — Encounter: Payer: Medicare PPO | Admitting: Physician Assistant

## 2023-06-02 ENCOUNTER — Other Ambulatory Visit: Payer: Self-pay | Admitting: Family Medicine

## 2023-06-02 DIAGNOSIS — Z950 Presence of cardiac pacemaker: Secondary | ICD-10-CM | POA: Diagnosis not present

## 2023-06-02 DIAGNOSIS — I480 Paroxysmal atrial fibrillation: Secondary | ICD-10-CM | POA: Diagnosis not present

## 2023-06-02 DIAGNOSIS — L97822 Non-pressure chronic ulcer of other part of left lower leg with fat layer exposed: Secondary | ICD-10-CM | POA: Diagnosis not present

## 2023-06-02 DIAGNOSIS — I110 Hypertensive heart disease with heart failure: Secondary | ICD-10-CM | POA: Diagnosis not present

## 2023-06-02 DIAGNOSIS — S81812A Laceration without foreign body, left lower leg, initial encounter: Secondary | ICD-10-CM | POA: Diagnosis not present

## 2023-06-02 DIAGNOSIS — S8012XA Contusion of left lower leg, initial encounter: Secondary | ICD-10-CM | POA: Diagnosis not present

## 2023-06-02 DIAGNOSIS — Z7901 Long term (current) use of anticoagulants: Secondary | ICD-10-CM | POA: Diagnosis not present

## 2023-06-02 DIAGNOSIS — I5042 Chronic combined systolic (congestive) and diastolic (congestive) heart failure: Secondary | ICD-10-CM | POA: Diagnosis not present

## 2023-06-02 NOTE — Progress Notes (Addendum)
[place], [name] ([id number]) [id number] Page 1 of 7 Visit Report for 06/02/2023 Chief Complaint Document Details Patient Name: Date of Service: [name], [name] 06/02/2023 4:30 PM Medical Record Number: [id number] Patient Account Number: [id number] Date of Birth/Sex: Treating RN: Aug 14, 1925 (89 y.o. [name] Primary Care Provider: Merita Norton Other Clinician: Referring Provider: Treating Provider/Extender: Dory Larsen in Treatment: 4 Information Obtained from: Patient Chief Complaint Left LE ulcer Electronic Signature(s) Signed: 06/02/2023 3:57:27 PM By: Allen Derry PA-C Entered By: Allen Derry on 06/02/2023 15:57:26 -------------------------------------------------------------------------------- HPI Details Patient Name: Date of Service: [name], [name] 06/02/2023 4:30 PM Medical Record Number: [id number] Patient Account Number: [id number] Date of Birth/Sex: Treating RN: 1925/04/09 (89 y.o. [name] Primary Care Provider: Merita Norton Other Clinician: Referring Provider: Treating Provider/Extender: Dory Larsen in Treatment: 4 History of Present Illness HPI Description: 03-19-2022 upon evaluation today patient appears to be doing poorly in regard to the left posterior lower extremity ulcer. This is something that she actually struck on a car door getting out of the car and this was around January 18, 2022. She was going to urgent care due to her leg swelling when this occurred. Since that time she has had a tremendous amount of bleeding she also did have recommendations for medicated gauze to be used by urgent care. Initially she actually apparently had sutures placed over this area but then when the sutures were removed this dehisced to some degree. She was placed on Omnicef in the past there is no signs of infection right now but again that is definitely something we will need to keep a close eye on especially  with the depth of the wound. The good news that she does not seem to be having any pain which is excellent. And again right now they have been using medicated gauze packing into the wound. She did have an x-ray on February 03, 2022 which was negative for any signs of bone abnormality which is great news. Patient does have a history of chronic venous insufficiency/hypertension with the lower extremity ulcer now at this point. 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 03-25-2022 upon evaluation today patient appears to be doing better in regard to the wound on her leg. She has been tolerating the dressing changes without complication. Fortunately I do not see any signs of active infection locally or systemically at this time which is great news. 04-01-2022 upon evaluation today patient appears to be doing well with regard to her right leg. Fortunately there does not appear to be any evidence of active infection locally nor systemically which is great news and overall I am extremely pleased with where we stand today. I do believe that she is making progress there is still some need for sharp debridement at this point. [place], [name] ([id number]) [id number] Page 2 of 7 04-08-2022 upon evaluation today patient actually appears to be making excellent progress. I am actually very pleased with where we stand today. I do not see any signs of active infection locally or systemically which is great news. No fevers, chills, nausea, vomiting, or diarrhea. 5/8 using endoform which was started last week. Wound appears smaller with less depth. 04-22-2022 upon evaluation today patient appears to be doing well in regard to a large portion of her wound although there is one area that does have me concerned about the possibility of skin cancer. I do think that we  need to take a sample biopsy to send for  evaluation at this point. Patient is in agreement with that plan. Her family member who is with her states that she is also prone to skin cancers she has had 1 recently removed at the skin surgery center in [place]. They did give [place] the number to that location if indeed we need to end up making a referral. For now we will get a see how things go and what the biopsy shows initially. 04-29-2022 upon evaluation today patient appears to be doing well currently in regard to her wound in fact it actually appears to be a lot better especially after last week's biopsy. Fortunately there does not appear to be any signs of active infection locally or systemically at this time which is great news. No fevers, chills, nausea, vomiting, or diarrhea. Upon inspection the patient actually showed signs of complete resolution based on what we are seeing currently. Fortunately I do not see any evidence of infection which is great news she does have an area that is distal to where she had the original wound and this is opened today and appears to be an area of irritation unfortunately. Fortunately there does not appear to be any signs of active infection locally or systemically which is great news. Her appointment is July 14 for the skin surgery center.Marland Kitchen 05-27-2022 upon evaluation today patient appears to be doing well currently in regard to her wound in fact this appears to be completely healed which is great news. I see nothing open including the area where she had the skin cancer this is actually completely healed. In fact I cannot even tell personally where this was biopsied previously. Readmission: 04-29-2023 upon evaluation today patient appears to be doing somewhat poorly in regard to her wound over her left anterior lower extremity. She has been tolerating the dressing changes she unfortunately had a injury where she fell and had a contusion that occurred with a skin tear on 04-08-2023. Since that time she has  been having ongoing issues with this not really improving as well and as quickly as they would like to see. Fortunately there does not appear to be any signs of active infection at this time which is great news and overall I am extremely pleased with where she stands. Patient does have a history of hypertension, atrial fibrillation for which she is on anticoagulant therapy, and congestive heart failure. 05-09-2023 upon evaluation today patient appears to be doing excellent in regard to her wound which is actually showing signs of good improvement I am much happier with where things stand today compared to previous. 05-19-2023 upon evaluation today patient appears to be doing well currently in regard to her wound. She has been tolerating the dressing changes without complication. In fact I really feel like we are showing signs of excellent improvement at this point. I see no signs of infection which is great news. 05-27-2023 upon evaluation today patient appears to be doing well currently in regard to her wound. She has been tolerating the dressing changes without complication. Fortunately there does not appear to be any signs of infection actually think that she is making really good progress here towards healing. I think we are going to switch over to the collagen today. 06-02-2023 upon evaluation today patient appears to be doing well currently in regard to her wound. This is actually showing signs of excellent improvement I do believe the collagen is doing a good job here. Fortunately I do  not see any evidence of active infection locally or systemically which is great news. No fevers, chills, nausea, vomiting, or diarrhea. Electronic Signature(s) Signed: 06/02/2023 5:29:48 PM By: Allen Derry PA-C Entered By: Allen Derry on 06/02/2023 17:29:48 -------------------------------------------------------------------------------- Physical Exam Details Patient Name: Date of Service: [name], [name] 06/02/2023  4:30 PM Medical Record Number: [id number] Patient Account Number: [id number] Date of Birth/Sex: Treating RN: 12-28-1924 (89 y.o. [name] Primary Care Provider: Merita Norton Other Clinician: Referring Provider: Treating Provider/Extender: Dory Larsen in Treatment: 4 Constitutional Well-nourished and well-hydrated in no acute distress. Respiratory normal breathing without difficulty. Psychiatric this patient is able to make decisions and demonstrates good insight into disease process. Alert and Oriented x 3. pleasant and cooperative. [place], [name] ([id number]) [id number] Page 3 of 7 Notes Upon inspection patient's wound bed actually showed signs of good granulation epithelization at this point. Fortunately I do not see any evidence of worsening overall and very pleased in that regard. In general I think that the patient is making good headway towards complete closure. Electronic Signature(s) Signed: 06/02/2023 5:29:59 PM By: Allen Derry PA-C Entered By: Allen Derry on 06/02/2023 17:29:59 -------------------------------------------------------------------------------- Physician Orders Details Patient Name: Date of Service: MATHEW, STORCK 06/02/2023 4:30 PM Medical Record Number: [id number] Patient Account Number: [id number] Date of Birth/Sex: Treating RN: November 04, 1925 (89 y.o. 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 Bathing/ Shower/ Hygiene May shower; gently cleanse wound with antibacterial soap, rinse and pat dry prior to dressing wounds Anesthetic (Use 'Patient Medications' Section for Anesthetic Order Entry) Lidocaine applied to wound bed Edema Control - Lymphedema / Segmental Compressive Device / Other Elevate, Exercise Daily and A void Standing for Long Periods of Time. Elevate legs to the level of the heart and pump ankles as often as possible Elevate leg(s) parallel to the floor when sitting. Wound Treatment Wound #3 - Lower Leg Wound Laterality: Left Cleanser: Byram Ancillary Kit - 15 Day Supply (Generic) 3 x Per Week/30 Days Discharge Instructions: Use supplies as instructed; Kit contains: (15) Saline Bullets; (15) 3x3 Gauze; 15 pr Gloves Prim Dressing: Prisma 4.34 (in) (Generic) 3 x Per Week/30 Days ary Discharge Instructions: Moisten w/normal saline or sterile water; Cover wound as directed. Do not remove from wound bed. Secondary Dressing: (BORDER) Zetuvit Plus SILICONE BORDER Dressing 4x4 (in/in) (Generic) 3 x Per Week/30 Days Discharge Instructions: Please do not put silicone bordered dressings under wraps. Use non-bordered dressing only. Electronic Signature(s) Signed: 06/02/2023 5:22:58 PM By: Midge Aver MSN RN CNS WTA Signed: 06/02/2023 5:56:33 PM By: Allen Derry PA-C Entered By: Midge Aver on 06/02/2023 16:47:42 [name], [name] ([id number]) [id number] Page 4 of 7 -------------------------------------------------------------------------------- Problem List Details Patient Name: Date of Service: [name], [name] 06/02/2023 4:30 PM Medical Record Number: [id number] Patient Account Number: [id number] Date of Birth/Sex: Treating RN: 1925-04-10 (89 y.o. [name] Primary Care Provider: Merita Norton Other Clinician: Referring Provider: Treating Provider/Extender: Dory Larsen in Treatment: 4 Active Problems ICD-10 Encounter Code Description Active Date MDM Diagnosis [phone]  Contusion of left lower leg, initial encounter 04/29/2023 No Yes L97.822 Non-pressure chronic ulcer of other part of left lower leg with fat layer exposed5/21/2024 No Yes I50.42 Chronic combined systolic (congestive) and diastolic (congestive) heart failure 04/29/2023 No Yes I10 Essential (primary) hypertension 04/29/2023 No Yes I48.0 Paroxysmal atrial fibrillation 04/29/2023 No Yes Z79.01 Long term (current) use of anticoagulants 04/29/2023 No Yes Inactive Problems Resolved Problems Electronic Signature(s) Signed: 06/02/2023 3:57:24 PM By: Allen Derry PA-C Entered By: Allen Derry on 06/02/2023 15:57:23 [name], [name] ([id number]) [id number] Page 5 of 7 -------------------------------------------------------------------------------- Progress Note Details Patient Name: Date of Service: [name], [name] 06/02/2023 4:30 PM Medical Record Number: [id number] Patient Account Number: [id number] Date of Birth/Sex: Treating RN: Oct 01, 1925 (89 y.o. [name] Primary Care Provider: Merita Norton Other Clinician: Referring Provider: Treating Provider/Extender: Dory Larsen in Treatment: 4 Subjective Chief Complaint Information obtained from Patient Left LE ulcer History of Present Illness (HPI) 03-19-2022 upon evaluation today patient appears to be doing poorly in regard to the left posterior lower extremity ulcer. This is something that she actually struck on a car door getting out of the car and this was around January 18, 2022. She was going to urgent care due to her leg swelling when this occurred. Since that time she has had a tremendous amount of bleeding she also did have recommendations for medicated gauze to be used by urgent care. Initially she actually apparently had sutures placed over this area but then when the  sutures were removed this dehisced to some degree. She was placed on Omnicef in the past there is no signs of infection right now but again that is definitely something we will need to keep a close eye on especially with the depth of the wound. The good news that she does not seem to be having any pain which is excellent. And again right now they have been using medicated gauze packing into the wound. She did have an x-ray on February 03, 2022 which was negative for any signs of bone abnormality which is great news. Patient does have a history of chronic venous insufficiency/hypertension with the lower extremity ulcer now at this point. 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 03-25-2022 upon evaluation today patient appears to be doing better in regard to the wound on her leg. She has been tolerating the dressing changes without complication. Fortunately I do not see any signs of active infection locally or systemically at this time which is great news. 04-01-2022 upon evaluation today patient appears to be doing well with regard to her right leg. Fortunately there does not appear to be any evidence of active infection locally nor systemically which is great news and overall I am extremely pleased with where we stand today. I do believe that she is making progress there is still some need for sharp debridement at this point. 04-08-2022 upon evaluation today patient actually appears to be making excellent progress. I am actually very pleased with where we stand today. I do not see any signs of active infection locally or systemically which is great news. No fevers, chills, nausea, vomiting, or diarrhea. 5/8 using endoform which was started last week. Wound appears smaller with less depth. 04-22-2022 upon evaluation today patient appears to be doing well in regard to a large portion of her wound although there is one  area that does have me concerned about the possibility of skin cancer.  I do think that we need to take a sample biopsy to send for evaluation at this point. Patient is in agreement with that plan. Her family member who is with her states that she is also prone to skin cancers she has had 1 recently removed at the skin surgery center in [place]. They did give [place] the number to that location if indeed we need to end up making a referral. For now we will get a see how things go and what the biopsy shows initially. 04-29-2022 upon evaluation today patient appears to be doing well currently in regard to her wound in fact it actually appears to be a lot better especially after last week's biopsy. Fortunately there does not appear to be any signs of active infection locally or systemically at this time which is great news. No fevers, chills, nausea, vomiting, or diarrhea. Upon inspection the patient actually showed signs of complete resolution based on what we are seeing currently. Fortunately I do not see any evidence of infection which is great news she does have an area that is distal to where she had the original wound and this is opened today and appears to be an area of irritation unfortunately. Fortunately there does not appear to be any signs of active infection locally or systemically which is great news. Her appointment is July 14 for the skin surgery center.Marland Kitchen 05-27-2022 upon evaluation today patient appears to be doing well currently in regard to her wound in fact this appears to be completely healed which is great news. I see nothing open including the area where she had the skin cancer this is actually completely healed. In fact I cannot even tell personally where this was biopsied previously. Readmission: 04-29-2023 upon evaluation today patient appears to be doing somewhat poorly in regard to her wound over her left anterior lower extremity. She has been tolerating the dressing changes she  unfortunately had a injury where she fell and had a contusion that occurred with a skin tear on 04-08-2023. Since that time she has been having ongoing issues with this not really improving as well and as quickly as they would like to see. Fortunately there does not appear to be any signs of active infection at this time which is great news and overall I am extremely pleased with where she stands. Patient does have a history of hypertension, atrial fibrillation for which she is on anticoagulant therapy, and congestive heart failure. 05-09-2023 upon evaluation today patient appears to be doing excellent in regard to her wound which is actually showing signs of good improvement I am much happier with where things stand today compared to previous. 05-19-2023 upon evaluation today patient appears to be doing well currently in regard to her wound. She has been tolerating the dressing changes without complication. In fact I really feel like we are showing signs of excellent improvement at this point. I see no signs of infection which is great news. 05-27-2023 upon evaluation today patient appears to be doing well currently in regard to her wound. She has been tolerating the dressing changes without complication. Fortunately there does not appear to be any signs of infection actually think that she is making really good progress here towards healing. I think [name], [name] ([id number]) [id number] Page 6 of 7 we are going to switch over to the collagen today. 06-02-2023 upon evaluation today patient appears to be doing well currently in regard to her wound. This is actually showing signs of excellent improvement I do  believe the collagen is doing a good job here. Fortunately I do not see any evidence of active infection locally or systemically which is great news. No fevers, chills, nausea, vomiting, or diarrhea. Objective Constitutional Well-nourished and well-hydrated in no acute  distress. Vitals Time Taken: 4:38 PM, Height: 59 in, Weight: 116 lbs, BMI: 23.4, Temperature: 97.8 F, Pulse: 84 bpm, Respiratory Rate: 18 breaths/min, Blood Pressure: 113/57 mmHg. Respiratory normal breathing without difficulty. Psychiatric this patient is able to make decisions and demonstrates good insight into disease process. Alert and Oriented x 3. pleasant and cooperative. General Notes: Upon inspection patient's wound bed actually showed signs of good granulation epithelization at this point. Fortunately I do not see any evidence of worsening overall and very pleased in that regard. In general I think that the patient is making good headway towards complete closure. Integumentary (Hair, Skin) Wound #3 status is Open. Original cause of wound was Trauma. The date acquired was: 04/08/2023. The wound has been in treatment 4 weeks. The wound is located on the Left Lower Leg. The wound measures 0.8cm length x 0.5cm width x 0.1cm depth; 0.314cm^2 area and 0.031cm^3 volume. There is Fat Layer (Subcutaneous Tissue) exposed. There is a medium amount of serosanguineous drainage noted. There is medium (34-66%) granulation within the wound bed. There is a medium (34-66%) amount of necrotic tissue within the wound bed including Adherent Slough. 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 Bathing/ Shower/ Hygiene: May shower; gently cleanse wound with antibacterial soap, rinse and pat dry prior to dressing wounds Anesthetic (Use 'Patient Medications' Section for Anesthetic Order Entry): Lidocaine applied to wound bed Edema Control -  Lymphedema / Segmental Compressive Device / Other: Elevate, Exercise Daily and Avoid Standing for Long Periods of Time. Elevate legs to the level of the heart and pump ankles as often as possible Elevate leg(s) parallel to the floor when sitting. WOUND #3: - Lower Leg Wound Laterality: Left Cleanser: Byram Ancillary Kit - 15 Day Supply (Generic) 3 x Per Week/30 Days Discharge Instructions: Use supplies as instructed; Kit contains: (15) Saline Bullets; (15) 3x3 Gauze; 15 pr Gloves Prim Dressing: Prisma 4.34 (in) (Generic) 3 x Per Week/30 Days ary Discharge Instructions: Moisten w/normal saline or sterile water; Cover wound as directed. Do not remove from wound bed. Secondary Dressing: (BORDER) Zetuvit Plus SILICONE BORDER Dressing 4x4 (in/in) (Generic) 3 x Per Week/30 Days Discharge Instructions: Please do not put silicone bordered dressings under wraps. Use non-bordered dressing only. 1. I am recommend that we have the patient continue to monitor for any signs of infection or worsening. In general I do believe that we are making excellent progress I am very pleased in this regard. 2. I am also going to suggest that she should continue with the collagen as I feel like this is really doing a good job helping to heal the wound. 3. I would also suggest that she continue to monitor for any evidence of worsening overall although I do not expect anything she really seems to be doing quite well. We will see patient back for reevaluation in 1 week here in the clinic. If anything worsens or changes patient will contact our office for additional [name], [name] ([id number]) [id number] Page 7 of  7 recommendations. Electronic Signature(s) Signed: 06/02/2023 5:41:56 PM By: Allen Derry PA-C Entered By: Allen Derry on 06/02/2023 17:41:55 -------------------------------------------------------------------------------- SuperBill Details Patient Name: Date of Service: [name], [name]  06/02/2023 Medical Record Number: [id number] Patient Account Number: [id number] Date of Birth/Sex: Treating RN: 10/03/1925 (89 y.o. 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 I10 Essential (primary) hypertension I48.0 Paroxysmal atrial fibrillation Z79.01 Long term (current) use of anticoagulants Facility Procedures : CPT4 Code: [id number] Description: 99213 - WOUND CARE VISIT-LEV 3 EST PT Modifier: Quantity: 1 Physician Procedures : CPT4 Code Description Modifier [id number] - WC PHYS LEVEL 3 - EST PT ICD-10 Diagnosis Description S80.12XA Contusion of left lower leg, initial encounter L97.822 Non-pressure chronic ulcer of other part of left lower leg with fat layer exposed  I50.42 Chronic combined systolic (congestive) and diastolic (congestive) heart failure I10 Essential (primary) hypertension Quantity: 1 Electronic Signature(s) Signed: 06/02/2023 5:44:03 PM By: Allen Derry PA-C Previous Signature: 06/02/2023 5:22:58 PM Version By: Midge Aver MSN RN CNS WTA Entered By: Allen Derry on 06/02/2023 17:44:03

## 2023-06-02 NOTE — Progress Notes (Addendum)
[place], [name] ([id number]) [id number] Page 1 of 9 Visit Report for 06/02/2023 Arrival Information Details Patient Name: Date of Service: [name], [name] 06/02/2023 4:30 PM Medical Record Number: [id number] Patient Account Number: [id number] Date of Birth/Sex: Treating RN: 1925-11-27 (89 y.o. [name] Primary Care Betty Brooks: Merita Norton Other Clinician: Referring Kimiyo Carmicheal: Treating Kilo Eshelman/Extender: Dory Larsen in Treatment: 4 Visit Information History Since Last Visit Added or deleted any medications: No Patient Arrived: Walker Has Dressing in Place as Prescribed: Yes Arrival Time: 16:35 Pain Present Now: No Accompanied By: daughter Transfer Assistance: None Patient Identification Verified: Yes Secondary Verification Process Completed: Yes Patient Requires Transmission-Based Precautions: No Patient Has Alerts: Yes Patient Alerts: Patient on Blood Thinner non compressible Electronic Signature(s) Signed: 06/02/2023 5:22:58 PM By: Midge Aver MSN RN CNS WTA Entered By: Midge Aver on 06/02/2023 16:38:37 -------------------------------------------------------------------------------- Clinic Level of Care Assessment Details Patient Name: Date of Service: [name] [facility] 06/02/2023 4:30 PM Medical Record Number: [id number] Patient Account Number: [id number] Date of Birth/Sex: Treating RN: 1925/07/12 (89 y.o. [name] Primary Care Valencia Kassa: Merita Norton Other Clinician: Referring Maksymilian Mabey: Treating Whitnee Orzel/Extender: Dory Larsen in Treatment: 4 Clinic Level of Care Assessment Items TOOL 4 Quantity Score X- 1 0 Use when only an EandM is performed on FOLLOW-UP visit ASSESSMENTS - Nursing Assessment / Reassessment X- 1 10 Reassessment of Co-morbidities (includes updates in patient status) X- 1 5 Reassessment of Adherence to Treatment Plan ASSESSMENTS - Wound and Skin A ssessment / Reassessment X - Simple  Wound Assessment / Reassessment - one wound 1 5 [name], [name] ([id number]) [id number] Page 2 of 9 []  - 0 Complex Wound Assessment / Reassessment - multiple wounds []  - 0 Dermatologic / Skin Assessment (not related to wound area) ASSESSMENTS - Focused Assessment []  - 0 Circumferential Edema Measurements - multi extremities []  - 0 Nutritional Assessment / Counseling / Intervention []  - 0 Lower Extremity Assessment (monofilament, tuning fork, pulses) []  - 0 Peripheral Arterial Disease Assessment (using hand held doppler) ASSESSMENTS - Ostomy and/or Continence Assessment and Care []  - 0 Incontinence Assessment and Management []  - 0 Ostomy Care Assessment and Management (repouching, etc.) PROCESS - Coordination of Care X - Simple Patient / Family Education for ongoing care 1 15 []  - 0 Complex (extensive) Patient / Family Education for ongoing care X- 1 10 Staff obtains Chiropractor, Records, T Results / Process Orders est []  - 0 Staff telephones HHA, Nursing Homes / Clarify orders / etc []  - 0 Routine Transfer to another Facility (non-emergent condition) []  - 0 Routine Hospital Admission (non-emergent condition) []  - 0 New Admissions / Manufacturing engineer / Ordering NPWT Apligraf, etc. , []  - 0 Emergency Hospital Admission (emergent condition) X- 1 10 Simple Discharge Coordination []  - 0 Complex (extensive) Discharge Coordination PROCESS - Special Needs []  - 0 Pediatric / Minor Patient Management []  - 0 Isolation Patient Management []  - 0 Hearing / Language / Visual special needs []  - 0 Assessment of Community assistance (transportation, D/C planning, etc.) []  - 0 Additional assistance / Altered mentation []  - 0 Support Surface(s) Assessment (bed, cushion, seat, etc.) INTERVENTIONS - Wound Cleansing / Measurement X - Simple Wound Cleansing - one wound 1 5 []  - 0 Complex Wound Cleansing - multiple wounds X- 1 5 Wound Imaging  (photographs - any number of wounds) []  - 0 Wound Tracing (instead of photographs) X- 1 5 Simple Wound Measurement - one wound []  - 0 Complex Wound Measurement - multiple wounds INTERVENTIONS -  Wound Dressings X - Small Wound Dressing one or multiple wounds 1 10 []  - 0 Medium Wound Dressing one or multiple wounds []  - 0 Large Wound Dressing one or multiple wounds []  - 0 Application of Medications - topical []  - 0 Application of Medications - injection INTERVENTIONS - Miscellaneous []  - 0 External ear exam []  - 0 Specimen Collection (cultures, biopsies, blood, body fluids, etc.) []  - 0 Specimen(s) / Culture(s) sent or taken to Lab for analysis [place], [name] ([id number]) [id number] Page 3 of 9 []  - 0 Patient Transfer (multiple staff / Nurse, adult / Similar devices) []  - 0 Simple Staple / Suture removal (25 or less) []  - 0 Complex Staple / Suture removal (26 or more) []  - 0 Hypo / Hyperglycemic Management (close monitor of Blood Glucose) []  - 0 Ankle / Brachial Index (ABI) - do not check if billed separately X- 1 5 Vital Signs Has the patient been seen at the hospital within the last three years: Yes Total Score: 85 Level Of Care: New/Established - Level 3 Electronic Signature(s) Signed: 06/02/2023 5:22:58 PM By: Midge Aver MSN RN CNS WTA Entered By: Midge Aver on 06/02/2023 16:48:19 -------------------------------------------------------------------------------- Encounter Discharge Information Details Patient Name: Date of Service: [name] [facility] 06/02/2023 4:30 PM Medical Record Number: [id number] Patient Account Number: [id number] Date of Birth/Sex: Treating RN: [birth year] (89 y.o. [name] Primary Care Jazzy Parmer: Merita Norton Other Clinician: Referring Navi Ewton: Treating Azalya Galyon/Extender: Dory Larsen in Treatment: 4 Encounter Discharge Information Items Discharge Condition: Stable Ambulatory Status:  Walker Discharge Destination: Home Transportation: Private Auto Accompanied By: daughter Schedule Follow-up Appointment: Yes Clinical Summary of Care: Electronic Signature(s) Signed: 06/02/2023 5:22:58 PM By: Midge Aver MSN RN CNS WTA Entered By: Midge Aver on 06/02/2023 16:55:34 -------------------------------------------------------------------------------- Lower Extremity Assessment Details Patient Name: Date of Service: [name], [name] 06/02/2023 4:30 PM Medical Record Number: [id number] Patient Account Number: [id number] Date of Birth/Sex: Treating RN: 07-02-1925 (89 y.o. [name] Primary Care Dniya Neuhaus: Merita Norton Other Clinician: [name] ([id number]) [id number] Page 4 of 9 Referring Joscelyn Hardrick: Treating Danh Bayus/Extender: Dory Larsen in Treatment: 4 Edema Assessment Assessed: [Left: No] [Right: No] Edema: [Left: N] [Right: o] Calf Left: Right: Point of Measurement: 38 cm From Medial Instep 31 cm Ankle Left: Right: Point of Measurement: 10 cm From Medial Instep 25 cm Knee To Floor Left: Right: From Medial Instep 36 cm Vascular Assessment Pulses: Dorsalis Pedis Palpable: [Left:Yes] Electronic Signature(s) Signed: 06/02/2023 5:22:58 PM By: Midge Aver MSN RN CNS WTA Entered By: Midge Aver on 06/02/2023 16:43:36 -------------------------------------------------------------------------------- Multi Wound Chart Details Patient Name: Date of Service: [name] [facility] 06/02/2023 4:30 PM Medical Record Number: [id number] Patient Account Number: [id number] Date of Birth/Sex: Treating RN: Jul 24, 1925 (89 y.o. [name] Primary Care Mahki Spikes: Merita Norton Other Clinician: Referring Alysiah Suppa: Treating Ausar Georgiou/Extender: Dory Larsen in Treatment: 4 Vital Signs Height(in): 59 Pulse(bpm): 84 Weight(lbs): 116 Blood Pressure(mmHg): 113/57 Body Mass Index(BMI): 23.4 Temperature(F):  97.8 Respiratory Rate(breaths/min): 18 [3:Photos:] [N/A:N/A] Left Lower Leg N/A N/A Wound Location: [name] ([id number]) [id number] Page 5 of 9 Trauma N/A N/A Wounding Event: Skin T ear N/A N/A Primary Etiology: Congestive Heart Failure, N/A N/A Comorbid History: Hypertension 04/08/2023 N/A N/A Date Acquired: 4 N/A N/A Weeks of Treatment: Open N/A N/A Wound Status: No N/A N/A Wound Recurrence: 0.8x0.5x0.1 N/A N/A Measurements [name] x W x D (cm) 0.314 N/A N/A A (cm) : rea 0.031 N/A N/A Volume (cm) : 90.00% N/A N/A %  Reduction in Area: 95.10% N/A N/A % Reduction in Volume: Full Thickness Without Exposed N/A N/A Classification: Support Structures Medium N/A N/A Exudate Amount: Serosanguineous N/A N/A Exudate Type: red, brown N/A N/A Exudate Color: Medium (34-66%) N/A N/A Granulation Amount: Medium (34-66%) N/A N/A Necrotic Amount: Fat Layer (Subcutaneous Tissue): Yes N/A N/A Exposed Structures: Fascia: No Tendon: No Muscle: No Joint: No Bone: No None N/A N/A Epithelialization: Treatment Notes Electronic Signature(s) Signed: 06/02/2023 5:22:58 PM By: Midge Aver MSN RN CNS WTA Entered By: Midge Aver on 06/02/2023 16:47:20 -------------------------------------------------------------------------------- Multi-Disciplinary Care Plan Details Patient Name: Date of Service: [name] [facility] 06/02/2023 4:30 PM Medical Record Number: [id number] Patient Account Number: [id number] Date of Birth/Sex: Treating RN: 1925-03-28 (89 y.o. [name] Primary Care Tremayne Sheldon: Merita Norton Other Clinician: Referring Miner Koral: Treating Rozella Servello/Extender: Dory Larsen in Treatment: 4 Active Inactive Necrotic Tissue Nursing Diagnoses: Knowledge deficit related to management of necrotic/devitalized tissue Goals: Necrotic/devitalized tissue will be minimized in the wound bed Date Initiated: 04/29/2023 Target Resolution Date:  06/14/2023 Goal Status: Active Interventions: Assess patient pain level pre-, during and post procedure and prior to discharge Notes: Wound/Skin Impairment [place], [name] ([id number]) [id number] Page 6 of 9 Nursing Diagnoses: Knowledge deficit related to ulceration/compromised skin integrity Goals: Patient/caregiver will verbalize understanding of skin care regimen Date Initiated: 04/29/2023 Date Inactivated: 05/27/2023 Target Resolution Date: 05/30/2023 Goal Status: Met Ulcer/skin breakdown will have a volume reduction of 30% by week 4 Date Initiated: 04/29/2023 Date Inactivated: 05/27/2023 Target Resolution Date: 05/30/2023 Goal Status: Met Ulcer/skin breakdown will have a volume reduction of 50% by week 8 Date Initiated: 04/29/2023 Target Resolution Date: 06/29/2023 Goal Status: Active Ulcer/skin breakdown will have a volume reduction of 80% by week 12 Date Initiated: 04/29/2023 Target Resolution Date: 07/30/2023 Goal Status: Active Ulcer/skin breakdown will heal within 14 weeks Date Initiated: 04/29/2023 Target Resolution Date: 08/30/2023 Goal Status: Active Interventions: Assess patient/caregiver ability to obtain necessary supplies Assess patient/caregiver ability to perform ulcer/skin care regimen upon admission and as needed Assess ulceration(s) every visit Notes: Electronic Signature(s) Signed: 06/02/2023 5:22:58 PM By: Midge Aver MSN RN CNS WTA Entered By: Midge Aver on 06/02/2023 16:54:30 -------------------------------------------------------------------------------- Pain Assessment Details Patient Name: Date of Service: [name], [name] 06/02/2023 4:30 PM Medical Record Number: [id number] Patient Account Number: [id number] Date of Birth/Sex: Treating RN: 05/22/1925 (89 y.o. [name] Primary Care Ebone Alcivar: Merita Norton Other Clinician: Referring Jerel Sardina: Treating Javaun Dimperio/Extender: Dory Larsen in Treatment: 4 Active  Problems Location of Pain Severity and Description of Pain Patient Has Paino No Site Locations [place], [name] ([id number]) [id number] Page 7 of 9 Pain Management and Medication Current Pain Management: Electronic Signature(s) Signed: 06/02/2023 5:22:58 PM By: Midge Aver MSN RN CNS WTA Entered By: Midge Aver on 06/02/2023 16:39:12 -------------------------------------------------------------------------------- Patient/Caregiver Education Details Patient Name: Date of Service: [name] 6/24/2024andnbsp4:30 PM Medical Record Number: [id number] Patient Account Number: [id number] Date of Birth/Gender: Treating RN: Apr 30, 1925 (89 y.o. [name] Primary Care Physician: Merita Norton Other Clinician: Referring Physician: Treating Physician/Extender: Dory Larsen in Treatment: 4 Education Assessment Education Provided To: Patient Education Topics Provided Wound/Skin Impairment: Handouts: Caring for Your Ulcer Methods: Explain/Verbal Responses: State content correctly Electronic Signature(s) Signed: 06/02/2023 5:22:58 PM By: Midge Aver MSN RN CNS WTA Entered By: Midge Aver on 06/02/2023 16:54:52 -------------------------------------------------------------------------------- Wound Assessment Details Patient Name: Date of Service: [name], [name] 06/02/2023 4:30 PM Medical Record Number: [id number] Patient Account Number: [id number] Date of Birth/Sex: Treating RN: 1925-06-05 (89 y.o. F)  Midge Aver Primary Care Marelly Wehrman: Merita Norton Other Clinician: Referring Taneika Choi: Treating Walter Min/Extender: Cline Cools Weeks in Treatment: 4 Wound Status [place], [name] ([id number]) [id number] Page 8 of 9 Wound Number: 3 Primary Etiology: Skin Tear Wound Location: Left Lower Leg Wound Status: Open Wounding Event: Trauma Comorbid History: Congestive Heart Failure, Hypertension Date Acquired:  04/08/2023 Weeks Of Treatment: 4 Clustered Wound: No Photos Wound Measurements Length: (cm) 0.8 Width: (cm) 0.5 Depth: (cm) 0.1 Area: (cm) 0.314 Volume: (cm) 0.031 % Reduction in Area: 90% % Reduction in Volume: 95.1% Epithelialization: None Wound Description Classification: Full Thickness Without Exposed Support Structures Exudate Amount: Medium Exudate Type: Serosanguineous Exudate Color: red, brown Foul Odor After Cleansing: No Slough/Fibrino Yes Wound Bed Granulation Amount: Medium (34-66%) Exposed Structure Necrotic Amount: Medium (34-66%) Fascia Exposed: No Necrotic Quality: Adherent Slough Fat Layer (Subcutaneous Tissue) Exposed: Yes Tendon Exposed: No Muscle Exposed: No Joint Exposed: No Bone Exposed: No Treatment Notes Wound #3 (Lower Leg) Wound Laterality: Left Cleanser [place]ram Ancillary Kit - 15 Day Supply Discharge Instruction: Use supplies as instructed; Kit contains: (15) Saline Bullets; (15) 3x3 Gauze; 15 pr Gloves Peri-Wound Care Topical Primary Dressing Prisma 4.34 (in) Discharge Instruction: Moisten w/normal saline or sterile water; Cover wound as directed. Do not remove from wound bed. Secondary Dressing (BORDER) Zetuvit Plus SILICONE BORDER Dressing 4x4 (in/in) Discharge Instruction: Please do not put silicone bordered dressings under wraps. Use non-bordered dressing only. Secured With Compression Wrap Compression Stockings Facilities manager) Signed: 06/02/2023 5:22:58 PM By: Midge Aver MSN RN CNS WTA Entered By: Midge Aver on 06/02/2023 16:43:07 [place], [name] ([id number]) [id number] Page 9 of 9 -------------------------------------------------------------------------------- Vitals Details Patient Name: Date of Service: [name], [name] 06/02/2023 4:30 PM Medical Record Number: [id number] Patient Account Number: [id number] Date of Birth/Sex: Treating RN: August 31, 1925 (89 y.o. [name] Primary Care  Abigael Mogle: Merita Norton Other Clinician: Referring Nick Armel: Treating Erika Slaby/Extender: Dory Larsen in Treatment: 4 Vital Signs Time Taken: 16:38 Temperature (F): 97.8 Height (in): 59 Pulse (bpm): 84 Weight (lbs): 116 Respiratory Rate (breaths/min): 18 Body Mass Index (BMI): 23.4 Blood Pressure (mmHg): 113/57 Reference Range: 80 - 120 mg / dl Electronic Signature(s) Signed: 06/02/2023 5:22:58 PM By: Midge Aver MSN RN CNS WTA Entered By: Midge Aver on 06/02/2023 16:39:00

## 2023-06-03 ENCOUNTER — Ambulatory Visit: Payer: Medicare PPO | Attending: Medical

## 2023-06-03 DIAGNOSIS — R600 Localized edema: Secondary | ICD-10-CM | POA: Diagnosis not present

## 2023-06-03 LAB — ECHOCARDIOGRAM COMPLETE: S' Lateral: 2.8 cm

## 2023-06-03 NOTE — Telephone Encounter (Signed)
Requested Prescriptions  Refused Prescriptions Disp Refills   dipyridamole-aspirin (AGGRENOX) 200-25 MG 12hr capsule [Pharmacy Med Name: ASPIRIN-DIPYRIDAM ER 25-200 MG] 180 capsule 2    Sig: TAKE 1 CAPSULE BY MOUTH TWICE A DAY     Hematology:  Antiplatelets - aspirin / dipyridamole Failed - 06/02/2023  9:30 AM      Failed - Cr in normal range and within 360 days    Creatinine, Ser  Date Value Ref Range Status  05/27/2023 1.02 (H) 0.44 - 1.00 mg/dL Final         Passed - eGFR is 10 or above and within 360 days    GFR, Estimated  Date Value Ref Range Status  05/27/2023 51 (L) >60 mL/min Final    Comment:    (NOTE) Calculated using the CKD-EPI Creatinine Equation (2021)    eGFR  Date Value Ref Range Status  09/26/2022 54 (L) >59 mL/min/1.73 Final         Passed - Patient is not pregnant      Passed - Valid encounter within last 12 months    Recent Outpatient Visits           2 weeks ago Viral URI with cough   [place] [facility] Merita Norton T, FNP   1 month ago Skin tear of left lower leg without complication, sequela   [facility] [name], FNP   8 months ago Annual physical exam   [facility] [name], FNP   1 year ago Cough, unspecified type   [facility] Malva Limes, MD   1 year ago Irritability   [place] [facility] [place], [place], PA-C       Future Appointments             In 1 week Fransico Michael, Cadence H, PA-C [facility] [place]

## 2023-06-05 ENCOUNTER — Telehealth: Payer: Self-pay | Admitting: Medical

## 2023-06-05 NOTE — Progress Notes (Signed)
Orrick, [name] ([id number]) [id number] Page 1 of 10 Visit Report for 04/29/2023 Chief Complaint Document Details Patient Name: Date of Service: [name], [name] 04/29/2023 9:30 A M Medical Record Number: [id number] Patient Account Number: [id number] Date of Birth/Sex: Treating [name]: 09-14-1925 (89 y.o. [name] Primary Care Provider: Merita [name] Other Clinician: Referring Provider: Treating Provider/Extender: [name] in Treatment: 0 Information Obtained from: Patient Chief Complaint Left LE ulcer Electronic Signature(s) Signed: 04/29/2023 10:24:13 AM By: Kylie Derry [name] Entered By: [name] on 04/29/2023 10:24:13 -------------------------------------------------------------------------------- Debridement Details Patient Name: Date of Service: [name], [name] 04/29/2023 9:30 A M Medical Record Number: [id number] Patient Account Number: [id number] Date of Birth/Sex: Treating [name]: June 17, 1925 (89 y.o. [name] Primary Care Provider: Merita [name] Other Clinician: Referring Provider: Treating Provider/Extender: [name] in Treatment: 0 Debridement Performed for Assessment: Wound #3 Left Lower Leg Performed By: Physician [name] Debridement Type: Debridement Level of Consciousness (Pre-procedure): Awake and Alert Pre-procedure Verification/Time Out Yes - 10:30 Taken: Start Time: 10:30 Percent of Wound Bed Debrided: 100% T Area Debrided (cm): otal 3.14 Tissue and other material debrided: Viable, Non-Viable, Blood Clots, Slough, Subcutaneous, Skin: Dermis , Skin: Epidermis, Slough Level: Skin/Subcutaneous Tissue Debridement Description: Excisional Instrument: Curette Bleeding: Moderate Hemostasis Achieved: Pressure End Time: 10:31 Procedural Pain: 0 Post Procedural Pain: 0 Response to Treatment: Procedure was tolerated well [name] ([id number]) [id number]  Page 2 of 10 Level of Consciousness (Post- Awake and Alert procedure): Post Debridement Measurements of Total Wound Length: (cm) 2 Width: (cm) 2 Depth: (cm) 0.7 Volume: (cm) 2.199 Character of Wound/Ulcer Post Debridement: Improved Post Procedure Diagnosis Same as Pre-procedure Electronic Signature(s) Signed: 04/29/2023 11:25:09 AM By: [name] Signed: 04/29/2023 4:38:31 PM By: Kylie Derry [name] Entered By: [name] on 04/29/2023 10:32:07 -------------------------------------------------------------------------------- HPI Details Patient Name: Date of Service: [name], [name] 04/29/2023 9:30 A M Medical Record Number: [id number] Patient Account Number: [id number] Date of Birth/Sex: Treating [name]: February 05, 1925 (89 y.o. [name] Primary Care Provider: Merita [name] Other Clinician: Referring Provider: Treating Provider/Extender: [name] in Treatment: 0 History of Present Illness HPI Description: 03-19-2022 upon evaluation today patient appears to be doing poorly in regard to the left posterior lower extremity ulcer. This is something that she actually struck on a car door getting out of the car and this was around January 18, 2022. She was going to urgent care due to her leg swelling when this occurred. Since that time she has had a tremendous amount of bleeding she also did have recommendations for medicated gauze to be used by urgent care. Initially she actually apparently had sutures placed over this area but then when the sutures were removed this dehisced to some degree. She was placed on Omnicef in the past there is no signs of infection right now but again that is definitely something we will need to keep a close eye on especially with the depth of the wound. The good news that she does not seem to be having any pain which is excellent. And again right now they have been using medicated gauze packing into the wound. She did have an x-ray on February 03, 2022 which was negative for any signs of bone abnormality which is great news. Patient does have a history of chronic venous insufficiency/hypertension with the lower extremity ulcer now at this point. 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 03-25-2022 upon evaluation today patient appears to be doing better in regard to the wound on her leg. She has been tolerating the dressing changes without complication. Fortunately I do not see any signs of active infection locally or systemically at this time which is great news. 04-01-2022 upon evaluation today patient appears to be doing well with regard to her right leg. Fortunately there does not appear to be any evidence of active infection locally nor systemically which is great news and overall I am extremely pleased with where we stand today. I do believe that she is making progress there is still some need for sharp debridement at this point. 04-08-2022 upon evaluation today patient actually appears to be making excellent progress. I am actually very pleased with where we stand today. I do not see any signs of active infection locally or systemically which is great news. No fevers, chills, nausea, vomiting, or diarrhea. 5/8 using endoform which was started last week. Wound appears smaller with less depth. 04-22-2022 upon evaluation today patient appears to be doing well in regard to a large portion of her wound although there is one area that does have me concerned about the possibility of skin cancer. I do think that we need to take a sample biopsy to send for evaluation at this point. Patient is in agreement with that plan. Her family member who is with her states that she is also prone to skin cancers she has had 1 recently removed at the skin surgery center in [place]. They did give [place] the number to that location if indeed we need to end up making a  referral. For now we will get a see how things go and what the biopsy shows initially. 04-29-2022 upon evaluation today patient appears to be doing well currently in regard to her wound in fact it actually appears to be a lot better especially after last week's biopsy. Fortunately there does not appear to be any signs of active infection locally or systemically at this time which is great news. No fevers, chills, nausea, vomiting, or diarrhea. Upon inspection the patient actually showed signs of complete resolution based on what we are seeing currently. Fortunately I do not see any evidence of infection which is great news she does have an area that is distal to where she had the original wound and this is opened today and appears to be an area of irritation unfortunately. Fortunately there does not appear to be any signs of active infection locally or systemically which is great news. Her appointment is July 14 for the skin surgery center.Marland Kitchen Boaz, [name] ([id number]) [id number] Page 3 of 10 05-27-2022 upon evaluation today patient appears to be doing well currently in regard to her wound in fact this appears to be completely healed which is great news. I see nothing open including the area where she had the skin cancer this is actually completely healed. In fact I cannot even tell personally where this was biopsied previously. Readmission: 04-29-2023 upon evaluation today patient appears to be doing somewhat poorly in regard to her wound over her left anterior lower extremity. She has been tolerating the dressing changes she unfortunately had a injury where she fell and had a contusion that occurred with a skin tear on 04-08-2023. Since that time she has been having ongoing issues with this not really improving as well and as quickly as they would like to see. Fortunately there does not appear to be any signs of active infection at  this time which is great news and overall I  am extremely pleased with where she stands. Patient does have a history of hypertension, atrial fibrillation for which she is on anticoagulant therapy, and congestive heart failure. Electronic Signature(s) Signed: 04/29/2023 2:11:01 PM By: Kylie Derry [name] Entered By: [name] on 04/29/2023 14:11:01 -------------------------------------------------------------------------------- Physical Exam Details Patient Name: Date of Service: [name], [name] 04/29/2023 9:30 A M Medical Record Number: [id number] Patient Account Number: [id number] Date of Birth/Sex: Treating [name]: 05-20-1925 (89 y.o. [name] Primary Care Provider: Merita [name] Other Clinician: Referring Provider: Treating Provider/Extender: [name] in Treatment: 0 Constitutional sitting or standing blood pressure is within target range for patient.. pulse regular and within target range for patient.Marland Kitchen respirations regular, non-labored and within target range for patient.Marland Kitchen temperature within target range for patient.. Well-nourished and well-hydrated in no acute distress. Eyes conjunctiva clear no eyelid edema noted. pupils equal round and reactive to light and accommodation. Ears, Nose, Mouth, and Throat no gross abnormality of ear auricles or external auditory canals. normal hearing noted during conversation. mucus membranes moist. Respiratory normal breathing without difficulty. Cardiovascular 2+ dorsalis pedis/posterior tibialis pulses. no clubbing, cyanosis, significant edema, <3 sec cap refill. Musculoskeletal normal gait and posture. no significant deformity or arthritic changes, no loss or range of motion, no clubbing. Psychiatric this patient is able to make decisions and demonstrates good insight into disease process. Alert and Oriented x 3. pleasant and cooperative. Notes Upon inspection patient's wound bed actually showed signs of having a contusion which is going to need to be debrided away to  clearway the worst of this. She actually tolerated that today without complication and in general I was able to remove the necrotic tissue without any problem including the blood clots slough and subcutaneous tissue as well as the skin and surrounding that was necessary. Postdebridement things are seeming to look much better. Electronic Signature(s) Signed: 04/29/2023 2:11:42 PM By: Kylie Derry [name] Entered By: [name] on 04/29/2023 14:11:41 [name], [name] ([id number]) [id number] Page 4 of 10 -------------------------------------------------------------------------------- Physician Orders Details Patient Name: Date of Service: [name], [name] 04/29/2023 9:30 A M Medical Record Number: [id number] Patient Account Number: [id number] Date of Birth/Sex: Treating [name]: 08-Nov-1925 (89 y.o. 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 Bathing/ Shower/ Hygiene May shower; gently cleanse wound with antibacterial soap, rinse and pat dry prior to dressing wounds Anesthetic (Use 'Patient Medications' Section for Anesthetic Order Entry) Lidocaine applied to wound bed Edema Control - Lymphedema / Segmental Compressive Device / Other Elevate, Exercise Daily and A void Standing for Long Periods of Time. Elevate legs to the level of the heart and pump ankles as often as  possible Elevate leg(s) parallel to the floor when sitting. Wound Treatment Wound #3 - Lower Leg Wound Laterality: Left Cleanser: Byram Ancillary Kit - 15 Day Supply (DME) (Generic) 1 x Per Day/30 Days Discharge Instructions: Use supplies as instructed; Kit contains: (15) Saline Bullets; (15) 3x3 Gauze; 15 pr Gloves Cleanser: Dakin 16 (oz) 0.25 1 x  Per Day/30 Days Discharge Instructions: Use as directed. Prim Dressing: Gauze (DME) (Generic) 1 x Per Day/30 Days ary Discharge Instructions: moistened with Dakins Solution Secondary Dressing: (BORDER) Zetuvit Plus SILICONE BORDER Dressing 4x4 (in/in) (DME) (Generic) 1 x Per Day/30 Days Discharge Instructions: Please do not put silicone bordered dressings under wraps. Use non-bordered dressing only. Electronic Signature(s) Signed: 04/29/2023 11:25:09 AM By: [name] Signed: 04/29/2023 4:38:31 PM By: Kylie Derry [name] Entered By: [name] on 04/29/2023 10:43:29 [name] ([id number]) [id number] Page 5 of 10 -------------------------------------------------------------------------------- Problem List Details Patient Name: Date of Service: [name], [name] 04/29/2023 9:30 A M Medical Record Number: [id number] Patient Account Number: [id number] Date of Birth/Sex: Treating [name]: 1925/02/21 (89 y.o. [name] Primary Care Provider: Merita [name] Other Clinician: Referring Provider: Treating Provider/Extender: [name] in Treatment: 0 Active Problems ICD-10 Encounter Code Description Active Date MDM Diagnosis S80.12XA Contusion of left lower leg, initial encounter 04/29/2023 No Yes L97.822 Non-pressure chronic ulcer of other part of left lower leg with fat layer exposed5/21/2024 No Yes I50.42 Chronic combined systolic (congestive) and diastolic (congestive) heart failure 04/29/2023 No Yes I10 Essential (primary) hypertension 04/29/2023 No Yes I48.0 Paroxysmal atrial fibrillation  04/29/2023 No Yes Z79.01 Long term (current) use of anticoagulants 04/29/2023 No Yes Inactive Problems Resolved Problems Electronic Signature(s) Signed: 04/29/2023 10:23:45 AM By: Kylie Derry [name] Entered By: [name] on 04/29/2023 10:23:44 Progress Note Details -------------------------------------------------------------------------------- [name] ([id number]) [id number] Page 6 of 10 Patient Name: Date of Service: [name], [name] 04/29/2023 9:30 A M Medical Record Number: [id number] Patient Account Number: [id number] Date of Birth/Sex: Treating [name]: 1925/07/04 (89 y.o. [name] Primary Care Provider: Merita [name] Other Clinician: Referring Provider: Treating Provider/Extender: [name] in Treatment: 0 Subjective Chief Complaint Information obtained from Patient Left LE ulcer History of Present Illness (HPI) 03-19-2022 upon evaluation today patient appears to be doing poorly in regard to the left posterior lower extremity ulcer. This is something that she actually struck on a car door getting out of the car and this was around January 18, 2022. She was going to urgent care due to her leg swelling when this occurred. Since that time she has had a tremendous amount of bleeding she also did have recommendations for medicated gauze to be used by urgent care. Initially she actually apparently had sutures placed over this area but then when the sutures were removed this dehisced to some degree. She was placed on Omnicef in the past there is no signs of infection right now but again that is definitely something we will need to keep a close eye on especially with the depth of the wound. The good news that she does not seem to be having any pain which is excellent. And again right now they have been using medicated gauze packing into the wound. She did have an x-ray on February 03, 2022 which was negative for any signs of bone  abnormality which is great news. Patient does have a history of chronic venous insufficiency/hypertension with the lower extremity ulcer now at this point. 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 03-25-2022 upon evaluation today patient appears to be doing better in regard to the wound on her leg. She has been tolerating the dressing changes without complication. Fortunately I do not see any signs of active infection locally or systemically at this time which is great news. 04-01-2022 upon evaluation  today patient appears to be doing well with regard to her right leg. Fortunately there does not appear to be any evidence of active infection locally nor systemically which is great news and overall I am extremely pleased with where we stand today. I do believe that she is making progress there is still some need for sharp debridement at this point. 04-08-2022 upon evaluation today patient actually appears to be making excellent progress. I am actually very pleased with where we stand today. I do not see any signs of active infection locally or systemically which is great news. No fevers, chills, nausea, vomiting, or diarrhea. 5/8 using endoform which was started last week. Wound appears smaller with less depth. 04-22-2022 upon evaluation today patient appears to be doing well in regard to a large portion of her wound although there is one area that does have me concerned about the possibility of skin cancer. I do think that we need to take a sample biopsy to send for evaluation at this point. Patient is in agreement with that plan. Her family member who is with her states that she is also prone to skin cancers she has had 1 recently removed at the skin surgery center in [place]. They did give [place] the number to that location if indeed we need to end up making a referral. For now we will get a see how things go and  what the biopsy shows initially. 04-29-2022 upon evaluation today patient appears to be doing well currently in regard to her wound in fact it actually appears to be a lot better especially after last week's biopsy. Fortunately there does not appear to be any signs of active infection locally or systemically at this time which is great news. No fevers, chills, nausea, vomiting, or diarrhea. Upon inspection the patient actually showed signs of complete resolution based on what we are seeing currently. Fortunately I do not see any evidence of infection which is great news she does have an area that is distal to where she had the original wound and this is opened today and appears to be an area of irritation unfortunately. Fortunately there does not appear to be any signs of active infection locally or systemically which is great news. Her appointment is July 14 for the skin surgery center.Marland Kitchen 05-27-2022 upon evaluation today patient appears to be doing well currently in regard to her wound in fact this appears to be completely healed which is great news. I see nothing open including the area where she had the skin cancer this is actually completely healed. In fact I cannot even tell personally where this was biopsied previously. Readmission: 04-29-2023 upon evaluation today patient appears to be doing somewhat poorly in regard to her wound over her left anterior lower extremity. She has been tolerating the dressing changes she unfortunately had a injury where she fell and had a contusion that occurred with a skin tear on 04-08-2023. Since that time she has been having ongoing issues with this not really improving as well and as quickly as they would like to see. Fortunately there does not appear to be any signs of active infection at this time which is great news and overall I am extremely pleased with where she stands. Patient does have a history of hypertension, atrial fibrillation for which she is on  anticoagulant therapy, and congestive heart failure. Patient History Information obtained from Patient, Chart. Allergies moxifloxacin, tobramycin Social History Never smoker, Marital Status - Widowed, Alcohol Use - Never, Drug  Use - No History, Caffeine Use - Daily. Medical History Cardiovascular Patient has history of Congestive Heart Failure - 2007, Hypertension Endocrine Denies history of Type II Diabetes Medical A Surgical History Notes nd Eyes Macular degeneration Oncologic Skin Cancer [name], [name] ([id number]) [id number] Page 7 of 10 Objective Constitutional sitting or standing blood pressure is within target range for patient.. pulse regular and within target range for patient.Marland Kitchen respirations regular, non-labored and within target range for patient.Marland Kitchen temperature within target range for patient.. Well-nourished and well-hydrated in no acute distress. Vitals Time Taken: 9:30 AM, Height: 59 in, Source: Stated, Weight: 116 lbs, Source: Stated, BMI: 23.4, Temperature: 97.9 F, Pulse: 81 bpm, Respiratory Rate: 18 breaths/min, Blood Pressure: 131/79 mmHg. Eyes conjunctiva clear no eyelid edema noted. pupils equal round and reactive to light and accommodation. Ears, Nose, Mouth, and Throat no gross abnormality of ear auricles or external auditory canals. normal hearing noted during conversation. mucus membranes moist. Respiratory normal breathing without difficulty. Cardiovascular 2+ dorsalis pedis/posterior tibialis pulses. no clubbing, cyanosis, significant edema, Musculoskeletal normal gait and posture. no significant deformity or arthritic changes, no loss or range of motion, no clubbing. Psychiatric this patient is able to make decisions and demonstrates good insight into disease process. Alert and Oriented x 3. pleasant and cooperative. General Notes: Upon inspection patient's wound bed actually showed signs of having a contusion which is going to  need to be debrided away to clearway the worst of this. She actually tolerated that today without complication and in general I was able to remove the necrotic tissue without any problem including the blood clots slough and subcutaneous tissue as well as the skin and surrounding that was necessary. Postdebridement things are seeming to look much better. Integumentary (Hair, Skin) Wound #3 status is Open. Original cause of wound was Trauma. The date acquired was: 04/08/2023. The wound is located on the Left Lower Leg. The wound measures 2cm length x 2cm width x 0.2cm depth; 3.142cm^2 area and 0.628cm^3 volume. There is no tunneling or undermining noted. There is a medium amount of serosanguineous drainage noted. There is no granulation within the wound bed. There is a large (67-100%) amount of necrotic tissue within the wound bed including Eschar and Adherent Slough. 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 There was a Excisional Skin/Subcutaneous Tissue Debridement with a total ear area of 3.14 sq cm performed by [name]. With the following instrument(s): Curette to remove Viable and Non-Viable tissue/material. Material removed includes Blood Clots, Subcutaneous Tissue, Slough, Skin: Dermis, and Skin: Epidermis. No specimens were taken. A time out was conducted at 10:30, prior to the start of the procedure. A Moderate amount of bleeding was controlled with Pressure. The procedure was tolerated well with a pain level of 0 throughout and a pain level of 0 following the procedure. Post  Debridement Measurements: 2cm length x 2cm width x 0.7cm depth; 2.199cm^3 volume. Character of Wound/Ulcer Post Debridement is improved. Post procedure Diagnosis Wound #3: Same as Pre-Procedure Plan Follow-up Appointments: Return Appointment in 1 week. Bathing/ Shower/ Hygiene: [name], [name] ([id number]) [id number] Page 8 of 10 May shower; gently cleanse wound with antibacterial soap, rinse and pat dry  prior to dressing wounds Anesthetic (Use 'Patient Medications' Section for Anesthetic Order Entry): Lidocaine applied to wound bed Edema Control - Lymphedema / Segmental Compressive Device / Other: Elevate, Exercise Daily and Avoid Standing for Long Periods of Time. Elevate legs to the level of the heart and pump ankles as often as possible Elevate leg(s) parallel to the floor when sitting. WOUND #3: - Lower Leg Wound Laterality: Left Cleanser: Byram Ancillary Kit - 15 Day Supply (DME) (Generic) 1 x Per Day/30 Days Discharge Instructions: Use supplies as instructed; Kit contains: (15) Saline Bullets; (15) 3x3 Gauze; 15 pr Gloves Cleanser: Dakin 16 (oz) 0.25 1 x Per Day/30 Days Discharge Instructions: Use as directed. Prim Dressing: Gauze (DME) (Generic) 1 x Per Day/30 Days ary Discharge Instructions: moistened with Dakins Solution Secondary Dressing: (BORDER) Zetuvit Plus SILICONE BORDER Dressing 4x4 (in/in) (DME) (Generic) 1 x Per Day/30 Days Discharge Instructions: Please do not put silicone bordered dressings under wraps. Use non-bordered dressing only. 1. Based on what I am seeing I do believe that we are making some good progress here. My suggestion based on what I see is going to be that we go ahead and utilize the Dakin's moistened gauze dressing for packing over the next week. My hope is after next week we will be able to switch to a different dressing in order to try and move this a little bit faster towards closure. But right now I think the optimal  thing is to get this doing better with regard to the wound surface of the wound and getting it cleaned out. 2. I am good recommend as well that should be changed daily and they are aware of exactly what to do in that regard. 3. I am also can recommend that we have the patient continue with the bordered foam dressing to cover. I do believe that this will do well for her for the time being. We will see patient back for reevaluation in 1 week here in the clinic. If anything worsens or changes patient will contact our office for additional recommendations. Electronic Signature(s) Signed: 04/29/2023 2:13:03 PM By: Kylie Derry [name] Entered By: [name] on 04/29/2023 14:13:03 -------------------------------------------------------------------------------- ROS/PFSH Details Patient Name: Date of Service: Kylie TALLEY, [name] 04/29/2023 9:30 A M Medical Record Number: [id number] Patient Account Number: [id number] Date of Birth/Sex: Treating [name]: 1925/06/08 (89 y.o. [name] Primary Care Provider: Merita [name] Other Clinician: Referring Provider: Treating Provider/Extender: [name] in Treatment: 0 Information Obtained From Patient Chart Eyes Medical History: Past Medical History Notes: Macular degeneration Cardiovascular Medical History: Positive for: Congestive Heart Failure - 2007; Hypertension Endocrine Medical History: Negative for: Type II Diabetes Oncologic [name], [name] ([id number]) [id number] Page 9 of 10 Medical History: Past Medical History Notes: Skin Cancer Immunizations Pneumococcal Vaccine: Received Pneumococcal Vaccination: Yes Received Pneumococcal Vaccination On or After 60th Birthday: Yes Implantable Devices Yes Family and Social History Never smoker; Marital Status - Widowed; Alcohol Use: Never; Drug Use: No History; Caffeine Use: Daily Electronic Signature(s) Signed: 04/29/2023 11:25:09 AM By: Kylie Pax  [name] Signed: 04/29/2023 4:38:31 PM By: Kylie Derry [name] Entered By: [name] on 04/29/2023 09:54:34 -------------------------------------------------------------------------------- SuperBill Details Patient Name: Date of Service: [name], HEINZELMAN 04/29/2023 Medical Record Number: [id number] Patient Account Number: [id number] Date of Birth/Sex: Treating [name]: 23-Sep-1925 (89 y.o. 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 I10 Essential (primary) hypertension I48.0 Paroxysmal atrial fibrillation Z79.01 Long term (current) use of anticoagulants Facility Procedures : CPT4 Code: [id number] Description: 99213 - WOUND CARE VISIT-LEV 3 EST PT Modifier: Quantity: 1 : CPT4 Code: [id number] Description: 11042 - DEB SUBQ TISSUE 20 SQ CM/< ICD-10 Diagnosis Description L97.822 Non-pressure chronic ulcer of other part of left lower leg with fat layer expos Modifier: ed Quantity: 1 Physician Procedures : CPT4 Code Description Modifier [id number] 99214 - WC PHYS LEVEL 4 - EST PT 25 ICD-10 Diagnosis Description S80.12XA Contusion of left lower leg, initial encounter L97.822 Non-pressure chronic ulcer of other part of left lower leg with fat layer exposed  I50.42 Chronic combined systolic (congestive) and diastolic (congestive) heart failure Mutz, Shaquala ([id number]) [id number] Essential (primary) hypertension Quantity: 1 [id number].pdf Page 10 of 10 : [id number] 11042 - WC PHYS SUBQ TISS 20 SQ CM ICD-10 Diagnosis Description L97.822 Non-pressure chronic ulcer of other part of left lower leg with fat  layer exposed Quantity: 1 Electronic Signature(s) Signed: 04/29/2023 2:13:25 PM By: Kylie Derry [name] Previous Signature: 04/29/2023 10:44:04 AM Version By: [name] Entered By: [name] on 04/29/2023 14:13:25

## 2023-06-05 NOTE — Progress Notes (Signed)
[name] ([id number]) [id number] Page 1 of 7 Visit Report for 05/09/2023 Chief Complaint Document Details Patient Name: Date of Service: [name], [name] 05/09/2023 10:45 A M Medical Record Number: [id number] Patient Account Number: [id number] Date of Birth/Sex: Treating RN: 17-Jun-1925 (89 y.o. Freddy Finner Primary Care Provider: Merita Norton Other Clinician: Referring Provider: Treating Provider/Extender: Dory Larsen in Treatment: 1 Information Obtained from: Patient Chief Complaint Left LE ulcer Electronic Signature(s) Signed: 05/09/2023 10:42:46 AM By: Allen Derry PA-C Entered By: Allen Derry on 05/09/2023 10:42:46 -------------------------------------------------------------------------------- HPI Details Patient Name: Date of Service: [name], [name] 05/09/2023 10:45 A M Medical Record Number: [id number] Patient Account Number: [id number] Date of Birth/Sex: Treating RN: Jan 10, 1925 (89 y.o. Freddy Finner Primary Care Provider: Merita Norton Other Clinician: Referring Provider: Treating Provider/Extender: Dory Larsen in Treatment: 1 History of Present Illness HPI Description: 03-19-2022 upon evaluation today patient appears to be doing poorly in regard to the left posterior lower extremity ulcer. This is something that she actually struck on a car door getting out of the car and this was around January 18, 2022. She was going to urgent care due to her leg swelling when this occurred. Since that time she has had a tremendous amount of bleeding she also did have recommendations for medicated gauze to be used by urgent care. Initially she actually apparently had sutures placed over this area but then when the sutures were removed this dehisced to some degree. She was placed on Omnicef in the past there is no signs of infection right now but again that is definitely something we will need to keep a close eye on  especially with the depth of the wound. The good news that she does not seem to be having any pain which is excellent. And again right now they have been using medicated gauze packing into the wound. She did have an x-ray on February 03, 2022 which was negative for any signs of bone abnormality which is great news. Patient does have a history of chronic venous insufficiency/hypertension with the lower extremity ulcer now at this point. 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 03-25-2022 upon evaluation today patient appears to be doing better in regard to the wound on her leg. She has been tolerating the dressing changes without complication. Fortunately I do not see any signs of active infection locally or systemically at this time which is great news. 04-01-2022 upon evaluation today patient appears to be doing well with regard to her right leg. Fortunately there does not appear to be any evidence of active infection locally nor systemically which is great news and overall I am extremely pleased with where we stand today. I do believe that she is making progress there is still some need for sharp debridement at this point. [name] ([id number]) [id number] Page 2 of 7 04-08-2022 upon evaluation today patient actually appears to be making excellent progress. I am actually very pleased with where we stand today. I do not see any signs of active infection locally or systemically which is great news. No fevers, chills, nausea, vomiting, or diarrhea. 5/8 using endoform which was started last week. Wound appears smaller with less depth. 04-22-2022 upon evaluation today patient appears to be doing well in regard to a large portion of her wound although there is one area that does have me concerned about the possibility of skin cancer. I do think  that we need to take a sample biopsy to send  for evaluation at this point. Patient is in agreement with that plan. Her family member who is with her states that she is also prone to skin cancers she has had 1 recently removed at the skin surgery center in [place]. They did give [place] the number to that location if indeed we need to end up making a referral. For now we will get a see how things go and what the biopsy shows initially. 04-29-2022 upon evaluation today patient appears to be doing well currently in regard to her wound in fact it actually appears to be a lot better especially after last week's biopsy. Fortunately there does not appear to be any signs of active infection locally or systemically at this time which is great news. No fevers, chills, nausea, vomiting, or diarrhea. Upon inspection the patient actually showed signs of complete resolution based on what we are seeing currently. Fortunately I do not see any evidence of infection which is great news she does have an area that is distal to where she had the original wound and this is opened today and appears to be an area of irritation unfortunately. Fortunately there does not appear to be any signs of active infection locally or systemically which is great news. Her appointment is July 14 for the skin surgery center.Marland Kitchen 05-27-2022 upon evaluation today patient appears to be doing well currently in regard to her wound in fact this appears to be completely healed which is great news. I see nothing open including the area where she had the skin cancer this is actually completely healed. In fact I cannot even tell personally where this was biopsied previously. Readmission: 04-29-2023 upon evaluation today patient appears to be doing somewhat poorly in regard to her wound over her left anterior lower extremity. She has been tolerating the dressing changes she unfortunately had a injury where she fell and had a contusion that occurred with a skin tear on 04-08-2023. Since that time she  has been having ongoing issues with this not really improving as well and as quickly as they would like to see. Fortunately there does not appear to be any signs of active infection at this time which is great news and overall I am extremely pleased with where she stands. Patient does have a history of hypertension, atrial fibrillation for which she is on anticoagulant therapy, and congestive heart failure. 05-09-2023 upon evaluation today patient appears to be doing excellent in regard to her wound which is actually showing signs of good improvement I am much happier with where things stand today compared to previous. Electronic Signature(s) Signed: 05/09/2023 11:17:52 AM By: Allen Derry PA-C Entered By: Allen Derry on 05/09/2023 11:17:52 -------------------------------------------------------------------------------- Physical Exam Details Patient Name: Date of Service: [name], [name] 05/09/2023 10:45 A M Medical Record Number: [id number] Patient Account Number: [id number] Date of Birth/Sex: Treating RN: [birth year] (89 y.o. Freddy Finner Primary Care Provider: Merita Norton Other Clinician: Referring Provider: Treating Provider/Extender: Dory Larsen in Treatment: 1 Constitutional Well-nourished and well-hydrated in no acute distress. Respiratory normal breathing without difficulty. Psychiatric this patient is able to make decisions and demonstrates good insight into disease process. Alert and Oriented x 3. pleasant and cooperative. Notes Upon inspection patient's wound bed actually showed signs of good granulation epithelization at this point. In general I do feel like that we are making some really good headway towards closure here and I am extremely pleased in that  regard. Electronic Signature(s) Signed: 05/09/2023 11:18:12 AM By: Allen Derry PA-C Entered By: Allen Derry on 05/09/2023 11:18:12 [name] ([id number]) [id number] Page 3 of  7 -------------------------------------------------------------------------------- Physician Orders Details Patient Name: Date of Service: ADALIZ, DOBIS 05/09/2023 10:45 A M Medical Record Number: [id number] Patient Account Number: [id number] Date of Birth/Sex: Treating RN: [birth year] (89 y.o. Freddy Finner Primary Care Provider: Merita Norton Other Clinician: Referring Provider: Treating Provider/Extender: Dory Larsen in Treatment: 1 Verbal / Phone Orders: No Diagnosis Coding ICD-10 Coding Code Description S80.12XA Contusion of left lower leg, initial encounter [phone] Non-pressure chronic ulcer of other part of left lower leg with fat layer exposed I50.42 Chronic combined systolic (congestive) and diastolic (congestive) heart failure I10 Essential (primary) hypertension I48.0 Paroxysmal atrial fibrillation Z79.01 Long term (current) use of anticoagulants Follow-up Appointments Return Appointment in 1 week. Bathing/ Shower/ Hygiene May shower; gently cleanse wound with antibacterial soap, rinse and pat dry prior to dressing wounds Anesthetic (Use 'Patient Medications' Section for Anesthetic Order Entry) Lidocaine applied to wound bed Edema Control - Lymphedema / Segmental Compressive Device / Other Elevate, Exercise Daily and A void Standing for Long Periods of Time. Elevate legs to the level of the heart and pump ankles as often as possible Elevate leg(s) parallel to the floor when sitting. Wound Treatment Wound #3 - Lower Leg Wound Laterality: Left Cleanser: Byram Ancillary Kit - 15 Day Supply (Generic) 1 x Per Day/30 Days Discharge Instructions: Use supplies as instructed; Kit contains: (15) Saline Bullets; (15) 3x3 Gauze; 15 pr Gloves Cleanser: Dakin 16 (oz) 0.25 1 x Per Day/30 Days Discharge Instructions: Use as directed. Prim Dressing: Gauze (Generic) 1 x Per Day/30 Days ary Discharge Instructions: moistened with Dakins Solution Secondary Dressing:  (BORDER) Zetuvit Plus SILICONE BORDER Dressing 4x4 (in/in) (Generic) 1 x Per Day/30 Days Discharge Instructions: Please do not put silicone bordered dressings under wraps. Use non-bordered dressing only. Electronic Signature(s) Signed: 05/09/2023 12:42:57 PM By: Yevonne Pax RN Signed: 05/09/2023 1:24:57 PM By: Allen Derry PA-C Entered By: Yevonne Pax on 05/09/2023 10:55:43 [name] ([id number]) [id number] Page 4 of 7 -------------------------------------------------------------------------------- Problem List Details Patient Name: Date of Service: [name], [name] 05/09/2023 10:45 A M Medical Record Number: [id number] Patient Account Number: [id number] Date of Birth/Sex: Treating RN: [birth year] (89 y.o. Freddy Finner Primary Care Provider: Merita Norton Other Clinician: Referring Provider: Treating Provider/Extender: Dory Larsen in Treatment: 1 Active Problems ICD-10 Encounter Code Description Active Date MDM Diagnosis S80.12XA Contusion of left lower leg, initial encounter 04/29/2023 No Yes L97.822 Non-pressure chronic ulcer of other part of left lower leg with fat layer exposed5/21/2024 No Yes I50.42 Chronic combined systolic (congestive) and diastolic (congestive) heart failure 04/29/2023 No Yes I10 Essential (primary) hypertension 04/29/2023 No Yes I48.0 Paroxysmal atrial fibrillation 04/29/2023 No Yes Z79.01 Long term (current) use of anticoagulants 04/29/2023 No Yes Inactive Problems Resolved Problems Electronic Signature(s) Signed: 05/09/2023 10:42:42 AM By: Allen Derry PA-C Entered By: Allen Derry on 05/09/2023 10:42:42 Progress Note Details -------------------------------------------------------------------------------- [name] ([id number]) [id number] Page 5 of 7 Patient Name: Date of Service: [name], [name] 05/09/2023 10:45 A M Medical Record Number: [id number] Patient Account Number:  [id number] Date of Birth/Sex: Treating RN: 05/07/1925 (89 y.o. Freddy Finner Primary Care Provider: Merita Norton Other Clinician: Referring Provider: Treating Provider/Extender: Dory Larsen in Treatment: 1 Subjective Chief Complaint Information obtained from Patient Left LE ulcer History of Present Illness (HPI) 03-19-2022 upon evaluation today patient appears to be doing poorly  in regard to the left posterior lower extremity ulcer. This is something that she actually struck on a car door getting out of the car and this was around January 18, 2022. She was going to urgent care due to her leg swelling when this occurred. Since that time she has had a tremendous amount of bleeding she also did have recommendations for medicated gauze to be used by urgent care. Initially she actually apparently had sutures placed over this area but then when the sutures were removed this dehisced to some degree. She was placed on Omnicef in the past there is no signs of infection right now but again that is definitely something we will need to keep a close eye on especially with the depth of the wound. The good news that she does not seem to be having any pain which is excellent. And again right now they have been using medicated gauze packing into the wound. She did have an x-ray on February 03, 2022 which was negative for any signs of bone abnormality which is great news. Patient does have a history of chronic venous insufficiency/hypertension with the lower extremity ulcer now at this point. 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 03-25-2022 upon evaluation today patient appears to be doing better in regard to the wound on her leg. She has been tolerating the dressing changes without complication. Fortunately I do not see any signs of active infection locally or systemically at this time which is great  news. 04-01-2022 upon evaluation today patient appears to be doing well with regard to her right leg. Fortunately there does not appear to be any evidence of active infection locally nor systemically which is great news and overall I am extremely pleased with where we stand today. I do believe that she is making progress there is still some need for sharp debridement at this point. 04-08-2022 upon evaluation today patient actually appears to be making excellent progress. I am actually very pleased with where we stand today. I do not see any signs of active infection locally or systemically which is great news. No fevers, chills, nausea, vomiting, or diarrhea. 5/8 using endoform which was started last week. Wound appears smaller with less depth. 04-22-2022 upon evaluation today patient appears to be doing well in regard to a large portion of her wound although there is one area that does have me concerned about the possibility of skin cancer. I do think that we need to take a sample biopsy to send for evaluation at this point. Patient is in agreement with that plan. Her family member who is with her states that she is also prone to skin cancers she has had 1 recently removed at the skin surgery center in [place]. They did give [place] the number to that location if indeed we need to end up making a referral. For now we will get a see how things go and what the biopsy shows initially. 04-29-2022 upon evaluation today patient appears to be doing well currently in regard to her wound in fact it actually appears to be a lot better especially after last week's biopsy. Fortunately there does not appear to be any signs of active infection locally or systemically at this time which is great news. No fevers, chills, nausea, vomiting, or diarrhea. Upon inspection the patient actually showed signs of complete resolution based on what we are seeing currently. Fortunately I do not see any  evidence of infection which  is great news she does have an area that is distal to where she had the original wound and this is opened today and appears to be an area of irritation unfortunately. Fortunately there does not appear to be any signs of active infection locally or systemically which is great news. Her appointment is July 14 for the skin surgery center.Marland Kitchen 05-27-2022 upon evaluation today patient appears to be doing well currently in regard to her wound in fact this appears to be completely healed which is great news. I see nothing open including the area where she had the skin cancer this is actually completely healed. In fact I cannot even tell personally where this was biopsied previously. Readmission: 04-29-2023 upon evaluation today patient appears to be doing somewhat poorly in regard to her wound over her left anterior lower extremity. She has been tolerating the dressing changes she unfortunately had a injury where she fell and had a contusion that occurred with a skin tear on 04-08-2023. Since that time she has been having ongoing issues with this not really improving as well and as quickly as they would like to see. Fortunately there does not appear to be any signs of active infection at this time which is great news and overall I am extremely pleased with where she stands. Patient does have a history of hypertension, atrial fibrillation for which she is on anticoagulant therapy, and congestive heart failure. 05-09-2023 upon evaluation today patient appears to be doing excellent in regard to her wound which is actually showing signs of good improvement I am much happier with where things stand today compared to previous. Objective Constitutional Well-nourished and well-hydrated in no acute distress. Vitals Time Taken: 10:36 AM, Height: 59 in, Weight: 116 lbs, BMI: 23.4, Temperature: 97.8 F, Pulse: 87 bpm, Respiratory Rate: 18 breaths/min, Blood Pressure: 118/72 mmHg. Respiratory normal breathing without  difficulty. [place], Marylouise [name] ([id number]) [id number] Page 6 of 7 Psychiatric this patient is able to make decisions and demonstrates good insight into disease process. Alert and Oriented x 3. pleasant and cooperative. General Notes: Upon inspection patient's wound bed actually showed signs of good granulation epithelization at this point. In general I do feel like that we are making some really good headway towards closure here and I am extremely pleased in that regard. Integumentary (Hair, Skin) Wound #3 status is Open. Original cause of wound was Trauma. The date acquired was: 04/08/2023. The wound has been in treatment 1 weeks. The wound is located on the Left Lower Leg. The wound measures 2cm length x 1.6cm width x 0.2cm depth; 2.513cm^2 area and 0.503cm^3 volume. There is Fat Layer (Subcutaneous Tissue) exposed. There is no tunneling or undermining noted. There is a medium amount of serosanguineous drainage noted. There is medium (34-66%) granulation within the wound bed. There is a medium (34-66%) amount of necrotic tissue within the wound bed including Adherent Slough. 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 Bathing/ Shower/ Hygiene: May shower; gently cleanse wound with antibacterial soap, rinse and pat dry prior to dressing wounds Anesthetic (Use 'Patient Medications' Section for Anesthetic Order Entry): Lidocaine applied to wound bed Edema Control - Lymphedema / Segmental Compressive Device / Other: Elevate, Exercise Daily and Avoid  Standing for Long Periods of Time. Elevate legs to the level  of the heart and pump ankles as often as possible Elevate leg(s) parallel to the floor when sitting. WOUND #3: - Lower Leg Wound Laterality: Left Cleanser: Byram Ancillary Kit - 15 Day Supply (Generic) 1 x Per Day/30 Days Discharge Instructions: Use supplies as instructed; Kit contains: (15) Saline Bullets; (15) 3x3 Gauze; 15 pr Gloves Cleanser: Dakin 16 (oz) 0.25 1 x Per Day/30 Days Discharge Instructions: Use as directed. Prim Dressing: Gauze (Generic) 1 x Per Day/30 Days ary Discharge Instructions: moistened with Dakins Solution Secondary Dressing: (BORDER) Zetuvit Plus SILICONE BORDER Dressing 4x4 (in/in) (Generic) 1 x Per Day/30 Days Discharge Instructions: Please do not put silicone bordered dressings under wraps. Use non-bordered dressing only. 1. I am going to recommend that we have the patient continue to monitor for any evidence of infection or worsening. Based on what I am seeing I do think that she will probably be ready to switch over to either collagen or Hydrofera Blue next week. 2. I am good recommend as well that we should continue to utilize the Dakin's moistened gauze dressing for the time being which I think is doing a really good job. We will see patient back for reevaluation in 1 week here in the clinic. If anything worsens or changes patient will contact our office for additional recommendations. Electronic Signature(s) Signed: 05/09/2023 11:18:38 AM By: Allen Derry PA-C Entered By: Allen Derry on 05/09/2023 11:18:38 [name] ([id number]) [id number] Page 7 of 7 -------------------------------------------------------------------------------- SuperBill Details Patient Name: Date of Service: [name], [name] 05/09/2023 Medical Record Number: [id number] Patient Account Number: [id number] Date of Birth/Sex: Treating RN: [birth year] (89 y.o. Freddy Finner Primary Care Provider: Merita Norton Other Clinician: Referring Provider: Treating  Provider/Extender: Dory Larsen in Treatment: 1 Diagnosis Coding ICD-10 Codes Code Description [phone] Contusion of left lower leg, initial encounter [phone] Non-pressure chronic ulcer of other part of left lower leg with fat layer exposed I50.42 Chronic combined systolic (congestive) and diastolic (congestive) heart failure I10 Essential (primary) hypertension I48.0 Paroxysmal atrial fibrillation Z79.01 Long term (current) use of anticoagulants Facility Procedures : CPT4 Code: [id number] Description: [phone] - WOUND CARE VISIT-LEV 2 EST PT Modifier: Quantity: 1 Physician Procedures : CPT4 Code Description Modifier [id number] - WC PHYS LEVEL 3 - EST PT ICD-10 Diagnosis Description S80.12XA Contusion of left lower leg, initial encounter L97.822 Non-pressure chronic ulcer of other part of left lower leg with fat layer exposed  I50.42 Chronic combined systolic (congestive) and diastolic (congestive) heart failure I10 Essential (primary) hypertension Quantity: 1 Electronic Signature(s) Signed: 05/09/2023 11:18:55 AM By: Allen Derry PA-C Entered By: Allen Derry on 05/09/2023 11:18:55

## 2023-06-05 NOTE — Telephone Encounter (Signed)
Pt's daughter made aware and verbalized understanding.    Cadence David Stall, PA-C 06/05/2023  2:04 PM EDT     Echo showed normal pump function, elevated lung pressures. . Moderate mitral valve disease

## 2023-06-05 NOTE — Telephone Encounter (Signed)
Pt daughter states she is returning call

## 2023-06-08 ENCOUNTER — Other Ambulatory Visit: Payer: Self-pay | Admitting: Family Medicine

## 2023-06-09 ENCOUNTER — Telehealth: Payer: Self-pay | Admitting: Cardiology

## 2023-06-09 NOTE — Telephone Encounter (Signed)
Spoke w patient's daughter who said her weight is up and she is scheduled on 7/8 and was wondering if she could be seen sooner.   Moved up to 06/11/23 with Dr. Azucena Cecil.  Patient was given metolazone for 2 days, then 2 more days and weight was normal after that-- 117 down to 112 on 6/24. Today it is 118.3  she is sleeping sitting up partly because it is easier to breath and partly because of coughing.  The cough is not new and did not improve when her weight was down.  She gave patient Robitussin DM for a couple doses to give her some relief and it did help.  Offered sooner visit today but daughter is unable to bring her today.  The [facility] is out and they are expecting the repair today.---pt is at her adult day program.

## 2023-06-09 NOTE — Telephone Encounter (Signed)
Pt c/o Shortness Of Breath: STAT if SOB developed within the last 24 hours or pt is noticeably SOB on the phone  1. Are you currently SOB (can you hear that pt is SOB on the phone)? yes 2. How long have you been experiencing SOB? Before 05-27-23  3. Are you SOB when sitting or when up moving around? All the time 4.  Are you currently experiencing any other symptoms? Swelling in her legs, have gained 6 since last Monday- daughter would like for patient to be seen asap please. She has an appointment  on 06-16-23- she says she needs to be seen please

## 2023-06-10 ENCOUNTER — Encounter: Payer: Medicare PPO | Attending: Physician Assistant | Admitting: Physician Assistant

## 2023-06-10 DIAGNOSIS — I5042 Chronic combined systolic (congestive) and diastolic (congestive) heart failure: Secondary | ICD-10-CM | POA: Insufficient documentation

## 2023-06-10 DIAGNOSIS — L97822 Non-pressure chronic ulcer of other part of left lower leg with fat layer exposed: Secondary | ICD-10-CM | POA: Diagnosis not present

## 2023-06-10 DIAGNOSIS — Z7902 Long term (current) use of antithrombotics/antiplatelets: Secondary | ICD-10-CM | POA: Insufficient documentation

## 2023-06-10 DIAGNOSIS — I110 Hypertensive heart disease with heart failure: Secondary | ICD-10-CM | POA: Diagnosis not present

## 2023-06-10 DIAGNOSIS — Z950 Presence of cardiac pacemaker: Secondary | ICD-10-CM | POA: Diagnosis not present

## 2023-06-10 DIAGNOSIS — I872 Venous insufficiency (chronic) (peripheral): Secondary | ICD-10-CM | POA: Diagnosis not present

## 2023-06-10 DIAGNOSIS — I480 Paroxysmal atrial fibrillation: Secondary | ICD-10-CM | POA: Insufficient documentation

## 2023-06-10 DIAGNOSIS — Z7901 Long term (current) use of anticoagulants: Secondary | ICD-10-CM | POA: Insufficient documentation

## 2023-06-10 DIAGNOSIS — S81812A Laceration without foreign body, left lower leg, initial encounter: Secondary | ICD-10-CM | POA: Diagnosis not present

## 2023-06-10 NOTE — Addendum Note (Signed)
Addended by: Vivi Barrack on: 06/10/2023 10:54 AM   Modules accepted: Orders

## 2023-06-10 NOTE — Progress Notes (Signed)
[place], [name] ([id number]) [id number] Page 1 of 7 Visit Report for 06/10/2023 Chief Complaint Document Details Patient Name: Date of Service: [name], [name] 06/10/2023 3:45 PM Medical Record Number: [id number] Patient Account Number: [id number] Date of Birth/Sex: Treating RN: 07-12-1925 (89 y.o. [name] Primary Care Provider: Merita Norton Other Clinician: Betha Loa Referring Provider: Treating Provider/Extender: Dory Larsen in Treatment: 6 Information Obtained from: Patient Chief Complaint Left LE ulcer Electronic Signature(s) Signed: 06/10/2023 3:41:24 PM By: Allen Derry PA-C Entered By: Allen Derry on 06/10/2023 15:41:24 -------------------------------------------------------------------------------- HPI Details Patient Name: Date of Service: [name], [name] 06/10/2023 3:45 PM Medical Record Number: [id number] Patient Account Number: [id number] Date of Birth/Sex: Treating RN: 1925-01-24 (89 y.o. [name] Primary Care Provider: Merita Norton Other Clinician: Betha Loa Referring Provider: Treating Provider/Extender: Dory Larsen in Treatment: 6 History of Present Illness HPI Description: 03-19-2022 upon evaluation today patient appears to be doing poorly in regard to the left posterior lower extremity ulcer. This is something that she actually struck on a car door getting out of the car and this was around January 18, 2022. She was going to urgent care due to her leg swelling when this occurred. Since that time she has had a tremendous amount of bleeding she also did have recommendations for medicated gauze to be used by urgent care. Initially she actually apparently had sutures placed over this area but then when the sutures were removed this dehisced to some degree. She was placed on Omnicef in the past there is no signs of infection right now but again that is definitely something we will need to keep a  close eye on especially with the depth of the wound. The good news that she does not seem to be having any pain which is excellent. And again right now they have been using medicated gauze packing into the wound. She did have an x-ray on February 03, 2022 which was negative for any signs of bone abnormality which is great news. Patient does have a history of chronic venous insufficiency/hypertension with the lower extremity ulcer now at this point. 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 03-25-2022 upon evaluation today patient appears to be doing better in regard to the wound on her leg. She has been tolerating the dressing changes without complication. Fortunately I do not see any signs of active infection locally or systemically at this time which is great news. 04-01-2022 upon evaluation today patient appears to be doing well with regard to her right leg. Fortunately there does not appear to be any evidence of active infection locally nor systemically which is great news and overall I am extremely pleased with where we stand today. I do believe that she is making progress there is still some need for sharp debridement at this point. [place], [name] ([id number]) [id number] Page 2 of 7 04-08-2022 upon evaluation today patient actually appears to be making excellent progress. I am actually very pleased with where we stand today. I do not see any signs of active infection locally or systemically which is great news. No fevers, chills, nausea, vomiting, or diarrhea. 5/8 using endoform which was started last week. Wound appears smaller with less depth. 04-22-2022 upon evaluation today patient appears to be doing well in regard to a large portion of her wound although there is one area that does have me concerned about the possibility of skin cancer. I  do think that we need to take a sample  biopsy to send for evaluation at this point. Patient is in agreement with that plan. Her family member who is with her states that she is also prone to skin cancers she has had 1 recently removed at the skin surgery center in [place]. They did give [place] the number to that location if indeed we need to end up making a referral. For now we will get a see how things go and what the biopsy shows initially. 04-29-2022 upon evaluation today patient appears to be doing well currently in regard to her wound in fact it actually appears to be a lot better especially after last week's biopsy. Fortunately there does not appear to be any signs of active infection locally or systemically at this time which is great news. No fevers, chills, nausea, vomiting, or diarrhea. Upon inspection the patient actually showed signs of complete resolution based on what we are seeing currently. Fortunately I do not see any evidence of infection which is great news she does have an area that is distal to where she had the original wound and this is opened today and appears to be an area of irritation unfortunately. Fortunately there does not appear to be any signs of active infection locally or systemically which is great news. Her appointment is July 14 for the skin surgery center.Marland Kitchen 05-27-2022 upon evaluation today patient appears to be doing well currently in regard to her wound in fact this appears to be completely healed which is great news. I see nothing open including the area where she had the skin cancer this is actually completely healed. In fact I cannot even tell personally where this was biopsied previously. Readmission: 04-29-2023 upon evaluation today patient appears to be doing somewhat poorly in regard to her wound over her left anterior lower extremity. She has been tolerating the dressing changes she unfortunately had a injury where she fell and had a contusion that occurred with a skin tear on 04-08-2023. Since  that time she has been having ongoing issues with this not really improving as well and as quickly as they would like to see. Fortunately there does not appear to be any signs of active infection at this time which is great news and overall I am extremely pleased with where she stands. Patient does have a history of hypertension, atrial fibrillation for which she is on anticoagulant therapy, and congestive heart failure. 05-09-2023 upon evaluation today patient appears to be doing excellent in regard to her wound which is actually showing signs of good improvement I am much happier with where things stand today compared to previous. 05-19-2023 upon evaluation today patient appears to be doing well currently in regard to her wound. She has been tolerating the dressing changes without complication. In fact I really feel like we are showing signs of excellent improvement at this point. I see no signs of infection which is great news. 05-27-2023 upon evaluation today patient appears to be doing well currently in regard to her wound. She has been tolerating the dressing changes without complication. Fortunately there does not appear to be any signs of infection actually think that she is making really good progress here towards healing. I think we are going to switch over to the collagen today. 06-02-2023 upon evaluation today patient appears to be doing well currently in regard to her wound. This is actually showing signs of excellent improvement I do believe the collagen is doing a good job  here. Fortunately I do not see any evidence of active infection locally or systemically which is great news. No fevers, chills, nausea, vomiting, or diarrhea. 06-10-2023 upon evaluation today patient appears to be doing well currently in regard to her wound which is actually showing signs of excellent improvement. Fortunately I do not see any evidence of active infection locally or systemically which is great news and in  general I do think that we are moving in the right direction here. Though the wound is doing great her breathing is not doing as good she does have an appointment with her cardiologist tomorrow I am afraid she may be in a congestive heart failure exacerbation. Electronic Signature(s) Signed: 06/10/2023 4:28:01 PM By: Allen Derry PA-C Entered By: Allen Derry on 06/10/2023 16:28:01 -------------------------------------------------------------------------------- Physical Exam Details Patient Name: Date of Service: [name], [name] 06/10/2023 3:45 PM Medical Record Number: [id number] Patient Account Number: [id number] Date of Birth/Sex: Treating RN: 1925/11/21 (89 y.o. [name] Primary Care Provider: Merita Norton Other Clinician: Betha Loa Referring Provider: Treating Provider/Extender: Dory Larsen in Treatment: 6 Constitutional Well-nourished and well-hydrated in no acute distress. Respiratory normal breathing without difficulty. [place], [name] ([id number]) [id number] Page 3 of 7 Psychiatric this patient is able to make decisions and demonstrates good insight into disease process. Alert and Oriented x 3. pleasant and cooperative. Notes Upon inspection patient's wound bed actually showed signs of excellent granulation epithelization there is no signs of infection and this is healing quite well. I am very pleased with where we stand. Electronic Signature(s) Signed: 06/10/2023 4:28:14 PM By: Allen Derry PA-C Entered By: Allen Derry on 06/10/2023 16:28:13 -------------------------------------------------------------------------------- Physician Orders Details Patient Name: Date of Service: [name], [name] 06/10/2023 3:45 PM Medical Record Number: [id number] Patient Account Number: [id number] Date of Birth/Sex: Treating RN: [birth year] (89 y.o. 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 Bathing/ Shower/ Hygiene May shower; gently cleanse wound with antibacterial soap, rinse and pat dry prior to dressing wounds Anesthetic (Use 'Patient Medications' Section for Anesthetic Order Entry) Lidocaine applied to wound bed Edema Control - Lymphedema / Segmental Compressive Device / Other Elevate, Exercise Daily and A void Standing for Long Periods of Time. Elevate legs to the level of the heart and pump ankles as often as possible Elevate leg(s) parallel to the floor when sitting. Wound Treatment Wound #3 - Lower Leg Wound Laterality: Left Cleanser: Byram Ancillary Kit - 15 Day Supply (Generic) 3 x Per Week/30 Days Discharge Instructions: Use supplies as instructed; Kit contains: (15) Saline Bullets; (15) 3x3 Gauze; 15 pr Gloves Prim Dressing: Prisma 4.34 (in) (Generic) 3 x Per Week/30 Days ary Discharge Instructions: Moisten w/normal saline or sterile water; Cover wound as directed. Do not remove from wound bed. Secondary Dressing: (BORDER) Zetuvit Plus SILICONE BORDER Dressing 4x4 (in/in) (Generic) 3 x Per Week/30 Days Discharge Instructions: Please do not put silicone bordered dressings under wraps. Use non-bordered dressing only. [place], [name] ([id number]) [id number] Page 4 of 7  Electronic Signature(s) Signed: 06/10/2023 5:13:07 PM  By: Allen Derry PA-C Signed: 06/11/2023 4:57:17 PM By: Betha Loa Entered By: Betha Loa on 06/10/2023 16:12:47 -------------------------------------------------------------------------------- Problem List Details Patient Name: Date of Service: [name], [name] 06/10/2023 3:45 PM Medical Record Number: [id number] Patient Account Number: [id number] Date of Birth/Sex: Treating RN: 20-Aug-1925 (89 y.o. [name] Primary Care Provider: Merita Norton Other Clinician: Betha Loa Referring Provider: Treating Provider/Extender: Dory Larsen in Treatment: 6 Active Problems ICD-10 Encounter Code Description Active Date MDM Diagnosis S80.12XA Contusion of left lower leg, initial encounter 04/29/2023 No Yes L97.822 Non-pressure chronic ulcer of other part of left lower leg with fat layer exposed5/21/2024 No Yes I50.42 Chronic combined systolic (congestive) and diastolic (congestive) heart failure 04/29/2023 No Yes I10 Essential (primary) hypertension 04/29/2023 No Yes I48.0 Paroxysmal atrial fibrillation 04/29/2023 No Yes Z79.01 Long term (current) use of anticoagulants 04/29/2023 No Yes Inactive Problems Resolved Problems Electronic Signature(s) Signed: 06/10/2023 3:41:17 PM By: Allen Derry PA-C Entered By: Allen Derry on 06/10/2023 15:41:17 [place], [name] ([id number]) [id number] Page 5 of 7 -------------------------------------------------------------------------------- Progress Note Details Patient Name: Date of Service: [name], [name] 06/10/2023 3:45 PM Medical Record Number: [id number] Patient Account Number: [id number] Date of Birth/Sex: Treating RN: 09/19/1925 (89 y.o. [name] Primary Care Provider: Merita Norton Other Clinician: Betha Loa Referring Provider: Treating Provider/Extender: Dory Larsen in Treatment: 6 Subjective Chief Complaint Information obtained from Patient Left LE ulcer History of  Present Illness (HPI) 03-19-2022 upon evaluation today patient appears to be doing poorly in regard to the left posterior lower extremity ulcer. This is something that she actually struck on a car door getting out of the car and this was around January 18, 2022. She was going to urgent care due to her leg swelling when this occurred. Since that time she has had a tremendous amount of bleeding she also did have recommendations for medicated gauze to be used by urgent care. Initially she actually apparently had sutures placed over this area but then when the sutures were removed this dehisced to some degree. She was placed on Omnicef in the past there is no signs of infection right now but again that is definitely something we will need to keep a close eye on especially with the depth of the wound. The good news that she does not seem to be having any pain which is excellent. And again right now they have been using medicated gauze packing into the wound. She did have an x-ray on February 03, 2022 which was negative for any signs of bone abnormality which is great news. Patient does have a history of chronic venous insufficiency/hypertension with the lower extremity ulcer now at this point. 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 03-25-2022 upon evaluation today patient appears to be doing better in regard to the wound on her leg. She has been tolerating the dressing changes without complication. Fortunately I do not see any signs of active infection locally or systemically at this time which is great news. 04-01-2022 upon evaluation today patient appears to be doing well with regard to her right leg. Fortunately there does not appear to be any evidence of active infection locally nor systemically which is great news and overall I am extremely pleased with where we stand today. I do believe that she is making  progress there is still some need for sharp debridement at this point. 04-08-2022  upon evaluation today patient actually appears to be making excellent progress. I am actually very pleased with where we stand today. I do not see any signs of active infection locally or systemically which is great news. No fevers, chills, nausea, vomiting, or diarrhea. 5/8 using endoform which was started last week. Wound appears smaller with less depth. 04-22-2022 upon evaluation today patient appears to be doing well in regard to a large portion of her wound although there is one area that does have me concerned about the possibility of skin cancer. I do think that we need to take a sample biopsy to send for evaluation at this point. Patient is in agreement with that plan. Her family member who is with her states that she is also prone to skin cancers she has had 1 recently removed at the skin surgery center in [place]. They did give [place] the number to that location if indeed we need to end up making a referral. For now we will get a see how things go and what the biopsy shows initially. 04-29-2022 upon evaluation today patient appears to be doing well currently in regard to her wound in fact it actually appears to be a lot better especially after last week's biopsy. Fortunately there does not appear to be any signs of active infection locally or systemically at this time which is great news. No fevers, chills, nausea, vomiting, or diarrhea. Upon inspection the patient actually showed signs of complete resolution based on what we are seeing currently. Fortunately I do not see any evidence of infection which is great news she does have an area that is distal to where she had the original wound and this is opened today and appears to be an area of irritation unfortunately. Fortunately there does not appear to be any signs of active infection locally or systemically which is great news. Her appointment is July 14 for  the skin surgery center.Marland Kitchen 05-27-2022 upon evaluation today patient appears to be doing well currently in regard to her wound in fact this appears to be completely healed which is great news. I see nothing open including the area where she had the skin cancer this is actually completely healed. In fact I cannot even tell personally where this was biopsied previously. Readmission: 04-29-2023 upon evaluation today patient appears to be doing somewhat poorly in regard to her wound over her left anterior lower extremity. She has been tolerating the dressing changes she unfortunately had a injury where she fell and had a contusion that occurred with a skin tear on 04-08-2023. Since that time she has been having ongoing issues with this not really improving as well and as quickly as they would like to see. Fortunately there does not appear to be any signs of active infection at this time which is great news and overall I am extremely pleased with where she stands. Patient does have a history of hypertension, atrial fibrillation for which she is on anticoagulant therapy, and congestive heart failure. 05-09-2023 upon evaluation today patient appears to be doing excellent in regard to her wound which is actually showing signs of good improvement I am much happier with where things stand today compared to previous. 05-19-2023 upon evaluation today patient appears to be doing well currently in regard to her wound. She has been tolerating the dressing changes without complication. In fact I really feel like we are showing signs of excellent improvement at this point. I see no signs of infection which is great news. 05-27-2023  upon evaluation today patient appears to be doing well currently in regard to her wound. She has been tolerating the dressing changes without complication. Fortunately there does not appear to be any signs of infection actually think that she is making really good progress here towards healing. I  think [name], [name] ([id number]) [id number] Page 6 of 7 we are going to switch over to the collagen today. 06-02-2023 upon evaluation today patient appears to be doing well currently in regard to her wound. This is actually showing signs of excellent improvement I do believe the collagen is doing a good job here. Fortunately I do not see any evidence of active infection locally or systemically which is great news. No fevers, chills, nausea, vomiting, or diarrhea. 06-10-2023 upon evaluation today patient appears to be doing well currently in regard to her wound which is actually showing signs of excellent improvement. Fortunately I do not see any evidence of active infection locally or systemically which is great news and in general I do think that we are moving in the right direction here. Though the wound is doing great her breathing is not doing as good she does have an appointment with her cardiologist tomorrow I am afraid she may be in a congestive heart failure exacerbation. Objective Constitutional Well-nourished and well-hydrated in no acute distress. Vitals Time Taken: 4:00 PM, Height: 59 in, Weight: 116 lbs, BMI: 23.4, Temperature: 97.5 F, Pulse: 88 bpm, Respiratory Rate: 18 breaths/min, Blood Pressure: 112/73 mmHg. Respiratory normal breathing without difficulty. Psychiatric this patient is able to make decisions and demonstrates good insight into disease process. Alert and Oriented x 3. pleasant and cooperative. General Notes: Upon inspection patient's wound bed actually showed signs of excellent granulation epithelization there is no signs of infection and this is healing quite well. I am very pleased with where we stand. Integumentary (Hair, Skin) Wound #3 status is Open. Original cause of wound was Trauma. The date acquired was: 04/08/2023. The wound has been in treatment 6 weeks. The wound is located on the Left Lower Leg. The wound measures 0.3cm length  x 0.3cm width x 0.1cm depth; 0.071cm^2 area and 0.007cm^3 volume. There is Fat Layer (Subcutaneous Tissue) exposed. There is a medium amount of serosanguineous drainage noted. There is medium (34-66%) granulation within the wound bed. There is a medium (34-66%) amount of necrotic tissue within the wound bed including Adherent Slough. 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 Bathing/ Shower/ Hygiene: May shower; gently cleanse wound with antibacterial soap, rinse and pat dry prior to dressing wounds Anesthetic (Use 'Patient Medications' Section for Anesthetic Order Entry): Lidocaine applied to wound bed Edema Control - Lymphedema / Segmental Compressive Device / Other: Elevate, Exercise Daily and Avoid Standing for Long Periods of Time. Elevate legs to the level of the heart and pump ankles as often as possible Elevate leg(s) parallel to the floor when sitting. WOUND #3: - Lower Leg Wound Laterality: Left Cleanser: Byram Ancillary Kit - 15 Day Supply (Generic) 3 x Per Week/30 Days Discharge Instructions: Use supplies as instructed; Kit contains: (15) Saline Bullets; (15) 3x3 Gauze; 15 pr Gloves Prim Dressing: Prisma 4.34 (in) (Generic) 3 x Per Week/30 Days ary Discharge Instructions: Moisten w/normal saline or sterile water; Cover  wound as directed. Do not remove from wound bed. Secondary Dressing: (BORDER) Zetuvit Plus SILICONE BORDER Dressing 4x4 (in/in) (Generic) 3 x Per Week/30 Days Discharge Instructions: Please do not put silicone bordered dressings under wraps. Use non-bordered dressing only. 1. I would recommend that  we have the patient continue to monitor for any evidence of infection or worsening. Based on what I am seeing I do think that we [facility], [name] ([id number]) [id number] Page 7 of 7 are moving in the right direction here. 2. I would recommend we continue with the Prisma. 3. I am also going to suggest the bordered foam to continue to be used to cover. 4. With regard to her breathing she does see her doctor tomorrow at cardiology. With that being said if this worsens I did advise that she may need to go to the ER for further evaluation and treatment sooner. We will see patient back for reevaluation in 1 week here in the clinic. If anything worsens or changes patient will contact our office for additional recommendations. Electronic Signature(s) Signed: 06/10/2023 4:29:06 PM By: Allen Derry PA-C Entered By: Allen Derry on 06/10/2023 16:29:06 -------------------------------------------------------------------------------- SuperBill Details Patient Name: Date of Service: [name], [name] 06/10/2023 Medical Record Number: [id number] Patient Account Number: [id number] Date of Birth/Sex: Treating RN: 14-Jun-1925 (89 y.o. 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 I10 Essential (primary) hypertension I48.0 Paroxysmal atrial fibrillation Z79.01 Long term (current) use of anticoagulants Facility Procedures : CPT4 Code: [id number] Description: 99213 - WOUND CARE VISIT-LEV 3 EST PT Modifier: Quantity: 1 Physician Procedures : CPT4 Code Description Modifier [id number]  99213 - WC PHYS LEVEL 3 - EST PT ICD-10 Diagnosis Description S80.12XA Contusion of left lower leg, initial encounter L97.822 Non-pressure chronic ulcer of other part of left lower leg with fat layer exposed  I50.42 Chronic combined systolic (congestive) and diastolic (congestive) heart failure I10 Essential (primary) hypertension Quantity: 1 Electronic Signature(s) Signed: 06/10/2023 4:29:16 PM By: Allen Derry PA-C Entered By: Allen Derry on 06/10/2023 16:29:16

## 2023-06-10 NOTE — Progress Notes (Addendum)
[place], [name] ([id number]) [id number] Page 1 of 9 Visit Report for 06/10/2023 Arrival Information Details Patient Name: Date of Service: [name], [name] 06/10/2023 3:45 PM Medical Record Number: [id number] Patient Account Number: [id number] Date of Birth/Sex: Treating RN: 12-Nov-1925 (89 y.o. [name] Primary Care [name]: [name] Other Clinician: Betha [name] Referring [name]: Treating [name]/Extender: [name] in Treatment: 6 Visit Information History Since Last Visit Added or deleted any medications: No Patient Arrived: [name] Any new allergies or adverse reactions: No Arrival Time: 15:59 Had a fall or experienced change in No Accompanied By: daughter activities of daily living that may affect Transfer Assistance: EasyPivot Patient Lift risk of falls: Patient Identification Verified: Yes Hospitalized since last visit: No Secondary Verification Process Completed: Yes Has Dressing in Place as Prescribed: Yes Patient Requires Transmission-Based Precautions: No Pain Present Now: No Patient Has Alerts: Yes Patient Alerts: Patient on Blood Thinner non compressible Electronic Signature(s) Signed: 06/10/2023 4:14:56 PM By: [name] Entered By: [name] on 06/10/2023 16:00:04 -------------------------------------------------------------------------------- Clinic Level of Care Assessment Details Patient Name: Date of Service: [name], [name] 06/10/2023 3:45 PM Medical Record Number: [id number] Patient Account Number: [id number] Date of Birth/Sex: Treating RN: [birth year] (89 y.o. [name] Primary Care [name]: [name] Other Clinician: Betha [name] Referring [name]: Treating [name]/Extender: [name] in Treatment: 6 Clinic Level of Care Assessment Items TOOL 4 Quantity Score []  - 0 Use when only an EandM is performed on FOLLOW-UP visit ASSESSMENTS - Nursing  Assessment / Reassessment X- 1 10 Reassessment of Co-morbidities (includes updates in patient status) X- 1 5 Reassessment of Adherence to Treatment Plan ASSESSMENTS - Wound and Skin A ssessment / Reassessment X - Simple Wound Assessment / Reassessment - one wound 1 5 [name], Kylie ([id number]) [id number] Page 2 of 9 []  - 0 Complex Wound Assessment / Reassessment - multiple wounds []  - 0 Dermatologic / Skin Assessment (not related to wound area) ASSESSMENTS - Focused Assessment []  - 0 Circumferential Edema Measurements - multi extremities []  - 0 Nutritional Assessment / Counseling / Intervention []  - 0 Lower Extremity Assessment (monofilament, tuning fork, pulses) []  - 0 Peripheral Arterial Disease Assessment (using hand held doppler) ASSESSMENTS - Ostomy and/or Continence Assessment and Care []  - 0 Incontinence Assessment and Management []  - 0 Ostomy Care Assessment and Management (repouching, etc.) PROCESS - Coordination of Care X - Simple Patient / Family Education for ongoing care 1 15 []  - 0 Complex (extensive) Patient / Family Education for ongoing care []  - 0 Staff obtains Chiropractor, Records, T Results / Process Orders est []  - 0 Staff telephones HHA, Nursing Homes / Clarify orders / etc []  - 0 Routine Transfer to another Facility (non-emergent condition) []  - 0 Routine [name] Admission (non-emergent condition) []  - 0 New Admissions / Manufacturing engineer / Ordering NPWT Apligraf, etc. , []  - 0 Emergency [name] Admission (emergent condition) X- 1 10 Simple Discharge Coordination []  - 0 Complex (extensive) Discharge Coordination PROCESS - Special Needs []  - 0 Pediatric / Minor Patient Management []  - 0 Isolation Patient Management []  - 0 Hearing / Language / Visual special needs []  - 0 Assessment of Community assistance (transportation, D/C planning, etc.) []  - 0 Additional assistance / Altered mentation []  - 0 Support  Surface(s) Assessment (bed, cushion, seat, etc.) INTERVENTIONS - Wound Cleansing / Measurement X - Simple Wound Cleansing - one wound 1 5 []  - 0 Complex Wound Cleansing - multiple wounds X- 1 5 Wound Imaging (photographs -  any number of wounds) []  - 0 Wound Tracing (instead of photographs) X- 1 5 Simple Wound Measurement - one wound []  - 0 Complex Wound Measurement - multiple wounds INTERVENTIONS - Wound Dressings []  - 0 Small Wound Dressing one or multiple wounds X- 1 15 Medium Wound Dressing one or multiple wounds []  - 0 Large Wound Dressing one or multiple wounds []  - 0 Application of Medications - topical []  - 0 Application of Medications - injection INTERVENTIONS - Miscellaneous []  - 0 External ear exam []  - 0 Specimen Collection (cultures, biopsies, blood, body fluids, etc.) []  - 0 Specimen(s) / Culture(s) sent or taken to Lab for analysis [place], [name] ([id number]) [id number] Page 3 of 9 []  - 0 Patient Transfer (multiple staff / Nurse, adult / Similar devices) []  - 0 Simple Staple / Suture removal (25 or less) []  - 0 Complex Staple / Suture removal (26 or more) []  - 0 Hypo / Hyperglycemic Management (close monitor of Blood Glucose) []  - 0 Ankle / Brachial Index (ABI) - do not check if billed separately X- 1 5 Vital Signs Has the patient been seen at the [name] within the last three years: Yes Total Score: 80 Level Of Care: New/Established - Level 3 Electronic Signature(s) Signed: 06/11/2023 4:57:17 PM By: [name] Entered By: [name] on 06/10/2023 16:13:36 -------------------------------------------------------------------------------- Encounter Discharge Information Details Patient Name: Date of Service: [name] 06/10/2023 3:45 PM Medical Record Number: [id number] Patient Account Number: [id number] Date of Birth/Sex: Treating RN: August 11, 1925 (89 y.o. [name] Primary Care [name]: [name] Other  Clinician: Betha [name] Referring [name]: Treating [name]/Extender: [name] in Treatment: 6 Encounter Discharge Information Items Discharge Condition: Stable Ambulatory Status: Walker Discharge Destination: Home Transportation: Private Auto Accompanied By: daughter Schedule Follow-up Appointment: Yes Clinical Summary of Care: Electronic Signature(s) Signed: 06/11/2023 4:57:17 PM By: [name] Entered By: [name] on 06/10/2023 16:20:18 -------------------------------------------------------------------------------- Lower Extremity Assessment Details Patient Name: Date of Service: [name], [name] 06/10/2023 3:45 PM Medical Record Number: [id number] Patient Account Number: [id number] Date of Birth/Sex: Treating RN: 19-Apr-1925 (89 y.o. [name] Primary Care Nyzir Dubois: [name] Other Clinician: Betha [name] [place], [name] ([id number]) [id number] Page 4 of 9 Referring Alexios Keown: Treating Oralee Rapaport/Extender: [name] in Treatment: 6 Edema Assessment Assessed: [Left: Yes] [Right: No] Edema: [Left: Ye] [Right: s] Calf Left: Right: Point of Measurement: 38 cm From Medial Instep 31.5 cm Ankle Left: Right: Point of Measurement: 10 cm From Medial Instep 25 cm Knee To Floor Left: Right: From Medial Instep 36 cm Vascular Assessment Pulses: Dorsalis Pedis Palpable: [Left:Yes] Electronic Signature(s) Signed: 06/11/2023 4:57:17 PM By: [name] Signed: 06/16/2023 5:27:27 PM By: Midge Aver MSN RN CNS WTA Entered By: [name] on 06/10/2023 16:08:18 -------------------------------------------------------------------------------- Multi Wound Chart Details Patient Name: Date of Service: Kylie Lillie [place] [facility] 06/10/2023 3:45 PM Medical Record Number: [id number] Patient Account Number: [id number] Date of Birth/Sex: Treating RN: [birth year] (89 y.o. [name] Primary Care Cailee Blanke: [name] Other Clinician: Betha [name] Referring Damico Partin: Treating Eliza Green/Extender: [name] in Treatment: 6 Vital Signs Height(in): 59 Pulse(bpm): 88 Weight(lbs): 116 Blood Pressure(mmHg): 112/73 Body Mass Index(BMI): 23.4 Temperature(F): 97.5 Respiratory Rate(breaths/min): 18 [3:Photos: No Photos Left Lower Leg Wound Location: Trauma Wounding Event: Skin Tear Primary Etiology: Congestive Heart Failure, Comorbid History: Hypertension 04/08/2023 Date Acquired: 6 Weeks of Treatment: Open Wound Status: No Wound Recurrence:] [N/A:N/A N/A N/A  N/A N/A N/A N/A N/A N/A] [name], [name] ([id number]) [3:0.3x0.3x0.1 Measurements  L x W x D (cm) 0.071 A (cm) : rea 0.007 Volume (cm) : 97.70% % Reduction in Area: 98.90% % Reduction in Volume: Full Thickness Without Exposed Classification: Support Structures Medium Exudate Amount: Serosanguineous Exudate  Type: red, brown Exudate Color: Medium (34-66%) Granulation Amount: Medium (34-66%) Necrotic Amount: Fat Layer (Subcutaneous Tissue): Yes N/A Exposed Structures: Fascia: No Tendon: No Muscle: No Joint: No Bone: No None Epithelialization:] [N/A:N/A N/A  N/A N/A N/A N/A N/A N/A N/A N/A N/A N/A] Treatment Notes Electronic Signature(s) Signed: 06/11/2023 4:57:17 PM By: [name] Entered By: [name] on 06/10/2023 16:08:22 -------------------------------------------------------------------------------- Multi-Disciplinary Care Plan Details Patient Name: Date of Service: [name] 06/10/2023 3:45 PM Medical Record Number: [id number] Patient Account Number: [id number] Date of Birth/Sex: Treating RN: [birth year] (89 y.o. [name] Primary Care Abed Schar: [name] Other Clinician: Betha [name] Referring Jentri Aye: Treating Imajean Mcdermid/Extender: [name] in Treatment: 6 Active Inactive Necrotic Tissue Nursing Diagnoses: Knowledge deficit related to management of necrotic/devitalized  tissue Goals: Necrotic/devitalized tissue will be minimized in the wound bed Date Initiated: 04/29/2023 Target Resolution Date: 06/14/2023 Goal Status: Active Interventions: Assess patient pain level pre-, during and post procedure and prior to discharge Notes: Wound/Skin Impairment Nursing Diagnoses: Knowledge deficit related to ulceration/compromised skin integrity Goals: Patient/caregiver will verbalize understanding of skin care regimen Date Initiated: 04/29/2023 Date Inactivated: 05/27/2023 Target Resolution Date: 05/30/2023 Goal Status: Met Ulcer/skin breakdown will have a volume reduction of 30% by week 4 Menges, April ([id number]) [id number] Page 6 of 9 Date Initiated: 04/29/2023 Date Inactivated: 05/27/2023 Target Resolution Date: 05/30/2023 Goal Status: Met Ulcer/skin breakdown will have a volume reduction of 50% by week 8 Date Initiated: 04/29/2023 Target Resolution Date: 06/29/2023 Goal Status: Active Ulcer/skin breakdown will have a volume reduction of 80% by week 12 Date Initiated: 04/29/2023 Target Resolution Date: 07/30/2023 Goal Status: Active Ulcer/skin breakdown will heal within 14 weeks Date Initiated: 04/29/2023 Target Resolution Date: 08/30/2023 Goal Status: Active Interventions: Assess patient/caregiver ability to obtain necessary supplies Assess patient/caregiver ability to perform ulcer/skin care regimen upon admission and as needed Assess ulceration(s) every visit Notes: Electronic Signature(s) Signed: 06/11/2023 4:57:17 PM By: [name] Signed: 06/16/2023 5:27:27 PM By: Midge Aver MSN RN CNS WTA Entered By: [name] on 06/10/2023 16:13:57 -------------------------------------------------------------------------------- Pain Assessment Details Patient Name: Date of Service: [name] 06/10/2023 3:45 PM Medical Record Number: [id number] Patient Account Number: [id number] Date of Birth/Sex: Treating RN: [birth year] (89 y.o.  [name] Primary Care Thaily Hackworth: [name] Other Clinician: Betha [name] Referring Chau Savell: Treating Gerod Caligiuri/Extender: [name] in Treatment: 6 Active Problems Location of Pain Severity and Description of Pain Patient Has Paino No Site Locations Rate the pain. Current Pain Level: 0 Pain Management and Medication Current Pain Management: HANSINI, GULBRANSON ([id number]) [id number] Page 7 of 9 Electronic Signature(s) Signed: 06/10/2023 4:14:56 PM By: [name] Entered By: [name] on 06/10/2023 16:00:28 -------------------------------------------------------------------------------- Patient/Caregiver Education Details Patient Name: Date of Service: [name], [name] 7/2/2024andnbsp3:45 PM Medical Record Number: [id number] Patient Account Number: [id number] Date of Birth/Gender: Treating RN: 1925/05/22 (89 y.o. [name] Primary Care Physician: [name] Other Clinician: Betha [name] Referring Physician: Treating Physician/Extender: [name] in Treatment: 6 Education Assessment Education Provided To: Patient and Caregiver Education Topics Provided Wound/Skin Impairment: Handouts: Other: continue wound care as directed Methods: Explain/Verbal Responses: State content correctly Electronic Signature(s) Signed: 06/11/2023 4:57:17 PM By: [name] Entered By: [name] on 06/10/2023 16:14:16 -------------------------------------------------------------------------------- Wound Assessment Details Patient Name:  Date of Service: [name], [name] 06/10/2023 3:45 PM Medical Record Number: [id number] Patient Account Number: [id number] Date of Birth/Sex: Treating RN: 08-02-1925 (89 y.o. [name] Primary Care Audriella Blakeley: [name] Other Clinician: Betha [name] Referring Lura Falor: Treating Whitnee Orzel/Extender: Cline Cools Weeks in Treatment: 6 Wound  Status Wound Number: 3 Primary Etiology: Skin Tear Wound Location: Left Lower Leg Wound Status: Open Wounding Event: Trauma Comorbid History: Congestive Heart Failure, Hypertension Date Acquired: 04/08/2023 Weeks Of Treatment: 6 Clustered Wound: No [name], [name] ([id number]) [id number] Page 8 of 9 Photos Wound Measurements Length: (cm) 0.3 Width: (cm) 0.3 Depth: (cm) 0.1 Area: (cm) 0.071 Volume: (cm) 0.007 % Reduction in Area: 97.7% % Reduction in Volume: 98.9% Epithelialization: None Wound Description Classification: Full Thickness Without Exposed Support Structures Exudate Amount: Medium Exudate Type: Serosanguineous Exudate Color: red, brown Foul Odor After Cleansing: No Slough/Fibrino Yes Wound Bed Granulation Amount: Medium (34-66%) Exposed Structure Necrotic Amount: Medium (34-66%) Fascia Exposed: No Necrotic Quality: Adherent Slough Fat Layer (Subcutaneous Tissue) Exposed: Yes Tendon Exposed: No Muscle Exposed: No Joint Exposed: No Bone Exposed: No Electronic Signature(s) Signed: 06/10/2023 4:11:51 PM By: [name] Signed: 06/16/2023 5:27:27 PM By: Midge Aver MSN RN CNS WTA Entered By: [name] on 06/10/2023 16:11:50 -------------------------------------------------------------------------------- Vitals Details Patient Name: Date of Service: Kylie Lillie [facility] 06/10/2023 3:45 PM Medical Record Number: [id number] Patient Account Number: [id number] Date of Birth/Sex: Treating RN: [birth year] (89 y.o. [name] Primary Care Alexsander Cavins: [name] Other Clinician: Betha [name] Referring Eliberto Sole: Treating Taegen Delker/Extender: [name] in Treatment: 6 Vital Signs Time Taken: 16:00 Temperature (F): 97.5 Height (in): 59 Pulse (bpm): 88 Weight (lbs): 116 Respiratory Rate (breaths/min): 18 Body Mass Index (BMI): 23.4 Blood Pressure (mmHg): 112/73 Reference Range: 80 - 120 mg / dl Herman, Rhylin  ([id number]) [id number] Page 9 of 9 Electronic Signature(s) Signed: 06/10/2023 4:14:56 PM By: [name] Entered By: [name] on 06/10/2023 16:00:20

## 2023-06-11 ENCOUNTER — Encounter: Payer: Self-pay | Admitting: Cardiology

## 2023-06-11 ENCOUNTER — Ambulatory Visit: Payer: Medicare PPO | Attending: Cardiology | Admitting: Cardiology

## 2023-06-11 VITALS — BP 124/68 | HR 70 | Ht 59.0 in | Wt 119.8 lb

## 2023-06-11 DIAGNOSIS — Z950 Presence of cardiac pacemaker: Secondary | ICD-10-CM

## 2023-06-11 DIAGNOSIS — I5030 Unspecified diastolic (congestive) heart failure: Secondary | ICD-10-CM | POA: Diagnosis not present

## 2023-06-11 DIAGNOSIS — I272 Pulmonary hypertension, unspecified: Secondary | ICD-10-CM

## 2023-06-11 DIAGNOSIS — I4821 Permanent atrial fibrillation: Secondary | ICD-10-CM | POA: Diagnosis not present

## 2023-06-11 DIAGNOSIS — I10 Essential (primary) hypertension: Secondary | ICD-10-CM

## 2023-06-11 MED ORDER — METOLAZONE 2.5 MG PO TABS: 2.5000 mg | ORAL_TABLET | ORAL | 3 refills | Status: DC

## 2023-06-11 NOTE — Progress Notes (Signed)
Cardiology Office Note:    Date:  06/11/2023   ID:  [name], DOB 1925/01/07, MRN [id number]  PCP:  [name], FNP   [facility] Providers Cardiologist:  Debbe Odea, MD Electrophysiologist:  Lanier Prude, MD     Referring MD: [name], FNP   Chief Complaint  Patient presents with   Follow-up    Concerned with increasing SOBr with LE edema and weight gain (please see 06/09/23 phone note).      History of Present Illness:    [name] is a 89 y.o. female with a hx of hypertension, permanent atrial fibrillation, HFpEF, sinus pauses s/p PPM ([id number]) who presents for follow-up.  States having worsening shortness of breath and leg edema over the past month or so.  Previously on torsemide 40 mg daily, achieving euvolemia.  Per daughter, patient was taking torsemide 20 mg daily.  Followed up in the clinic previously, was given additional metolazone 2.5 mg x 2 days with improvement in shortness of breath and edema.  But symptoms returned after stopping metolazone.  Metolazone was given x 3 days with improvement in symptoms.  She currently takes torsemide 40 mg daily, without metolazone, shortness of breath and leg edema has returned.  She denies smoking, but had exposure to secondhand smoke.  Recent echo 6/24 showed normal EF, severe pulmonary hypertension RVSP 67.7 mmHg.  Prior notes Echocardiogram 02/27/2019 2021 showed normal systolic function EF 60 to 65%, indeterminate diastolic function.  Patient has a history of sinus pause, has chronic atrial fibrillation for over 10 to 15 years now.  Not on anticoagulation due to risk of falls secondary to macular degeneration and wobbly gait.  Past Medical History:  Diagnosis Date   Arrhythmia    atrial fibrillation   Congestive heart failure (CHF) (HCC)    Hypertension    Pacemaker 2015    Past Surgical History:  Procedure Laterality Date   ABDOMINAL HYSTERECTOMY     EYE SURGERY     MOHS SURGERY   2023   PACEMAKER PLACEMENT N/A 2007   2017    Current Medications: Current Meds  Medication Sig   amLODipine (NORVASC) 5 MG tablet Take 1 tablet (5 mg total) by mouth daily.   atorvastatin (LIPITOR) 10 MG tablet Take 1 tablet (10 mg total) by mouth daily. Please schedule office visit before any future refill   calcium carbonate (OS-CAL - DOSED IN MG OF ELEMENTAL CALCIUM) 1250 (500 Ca) MG tablet Take 1 tablet by mouth 2 (two) times daily with a meal.   dipyridamole-aspirin (AGGRENOX) 200-25 MG 12hr capsule TAKE 1 CAPSULE BY MOUTH TWICE A DAY   empagliflozin (JARDIANCE) 10 MG TABS tablet Take 1 tablet (10 mg total) by mouth daily.   loratadine (CLARITIN) 10 MG tablet Take 10 mg by mouth daily.   losartan (COZAAR) 50 MG tablet TAKE 1 TABLET BY MOUTH TWICE A DAY   metoprolol (TOPROL-XL) 200 MG 24 hr tablet TAKE 1 TABLET BY MOUTH EVERY DAY   Multiple Vitamins-Minerals (PRESERVISION AREDS 2 PO) Take 1 tablet by mouth in the morning and at bedtime.   multivitamin-iron-minerals-folic acid (CENTRUM) chewable tablet Chew 1 tablet by mouth daily.   Omega-3 Fatty Acids (FISH OIL) 1000 MG CAPS Take 1 capsule by mouth daily.   potassium chloride (KLOR-CON) 10 MEQ tablet Take 10 mEq by mouth as needed. Only if she takes an additional dose of lasix   torsemide (DEMADEX) 20 MG tablet TAKE 2 TABLETS (40 MG TOTAL) BY MOUTH  DAILY. AND ADDITIONAL 20MG  IF NEEDED (Patient taking differently: Take 20 mg by mouth daily. With additional 20 mg if needed)   [DISCONTINUED] metolazone (ZAROXOLYN) 2.5 MG tablet Take 1 tablet (2.5 mg total) by mouth daily for 2 days. 30 minutes prior to Torsemide     Allergies:   Moxifloxacin, Tobramycin, and Codeine   Social History   Socioeconomic History   Marital status: Widowed    Spouse name: Not on file   Number of children: Not on file   Years of education: Not on file   Highest education level: Not on file  Occupational History   Not on file  Tobacco Use   Smoking  status: Never   Smokeless tobacco: Never  Substance and Sexual Activity   Alcohol use: Not Currently   Drug use: Never   Sexual activity: Not on file  Other Topics Concern   Not on file  Social History Narrative   Not on file   Social Determinants of Health   Financial Resource Strain: Low Risk  (07/04/2022)   Overall Financial Resource Strain (CARDIA)    Difficulty of Paying Living Expenses: Not hard at all  Food Insecurity: No Food Insecurity (07/04/2022)   Hunger Vital Sign    Worried About Running Out of Food in the Last Year: Never true    Ran Out of Food in the Last Year: Never true  Transportation Needs: No Transportation Needs (07/04/2022)   PRAPARE - Administrator, Civil Service (Medical): No    Lack of Transportation (Non-Medical): No  Physical Activity: Insufficiently Active (07/04/2022)   Exercise Vital Sign    Days of Exercise per Week: 2 days    Minutes of Exercise per Session: 20 min  Stress: No Stress Concern Present (07/04/2022)   [employer] of Occupational Health - Occupational Stress Questionnaire    Feeling of Stress : Not at all  Social Connections: Moderately Isolated (07/04/2022)   Social Connection and Isolation Panel [NHANES]    Frequency of Communication with Friends and Family: More than three times a week    Frequency of Social Gatherings with Friends and Family: More than three times a week    Attends Religious Services: More than 4 times per year    Active Member of [employer] or Organizations: No    Attends Banker Meetings: Never    Marital Status: Widowed     Family History: The patient's family history includes Heart attack in her daughter and father; Heart failure in her mother.  ROS:   Please see the history of present illness.     All other systems reviewed and are negative.  EKGs/Labs/Other Studies Reviewed:    The following studies were reviewed today:   EKG:  EKG is  ordered today.  The ekg ordered today  demonstrates atrial fibrillation, paced rhythm  Recent Labs: 05/27/2023: ALT 20; B Natriuretic Peptide 603.4; BUN 36; Creatinine, Ser 1.02; Hemoglobin 11.8; Platelets 243; Potassium 4.7; Sodium 130  Recent Lipid Panel No results found for: "CHOL", "TRIG", "HDL", "CHOLHDL", "VLDL", "LDLCALC", "LDLDIRECT"   Risk Assessment/Calculations:          Physical Exam:    VS:  BP 124/68 (BP Location: Left Arm, Patient Position: Sitting, Cuff Size: Normal)   Pulse 70   Ht 4\' 11"  (1.499 m)   Wt 119 lb 12.8 oz (54.3 kg)   LMP  (LMP Unknown)   SpO2 97%   BMI 24.20 kg/m     Wt Readings from  Last 3 Encounters:  06/11/23 119 lb 12.8 oz (54.3 kg)  05/27/23 118 lb (53.5 kg)  05/16/23 115 lb (52.2 kg)     GEN:  Well nourished, well developed in no acute distress HEENT: Normal NECK: No JVD; No carotid bruits CARDIAC: Regular rate and rhythm, 2/6 systolic murmur RESPIRATORY: Diminished breath sounds at bases ABDOMEN: Soft, non-tender, non-distended MUSCULOSKELETAL: 2+ edema edema; SKIN: Warm and dry NEUROLOGIC:  Alert and oriented x 3 PSYCHIATRIC:  Normal affect   ASSESSMENT:    1. Heart failure with preserved ejection fraction, unspecified HF chronicity (HCC)   2. Pulmonary hypertension, unspecified (HCC)   3. Permanent atrial fibrillation (HCC)   4. Pacemaker   5. Primary hypertension    PLAN:    In order of problems listed above:  HFpEF, appears volume overloaded torsemide 40 mg daily, start metolazone 2.5 mg daily x 3 days, then continue metolazone 3 days weekly.  Check BMP in 10 days.  Based on BMP, will consider stopping Norvasc for Aldactone.  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  Referral to heart failure and pulmonary hypertension clinic.  Diuretics as above. Permanent A-fib, not on anticoagulation due to risk of falls.  Heart rate controlled.  Continue  Toprol-XL.  EF 55 to 60%. S/p pacemaker for apparent sinus pauses.  Continue monitoring at the device clinic. Hypertension, BP controlled.  Continue Toprol-XL, losartan.  Follow-up in 6 weeks.  Consider switching Norvasc to Aldactone at follow-up visit.       Medication Adjustments/Labs and Tests Ordered: Current medicines are reviewed at length with the patient today.  Concerns regarding medicines are outlined above.  Orders Placed This Encounter  Procedures   Basic Metabolic Panel (BMET)   AMB referral to CHF clinic   EKG 12-Lead   Meds ordered this encounter  Medications   metolazone (ZAROXOLYN) 2.5 MG tablet    Sig: Take 1 tablet (2.5 mg total) by mouth 3 (three) times a week. Take 1 tablet (2.5 mg) by mouth 3 times a week Monday Wednesday and Friday    Dispense:  39 tablet    Refill:  3    Patient Instructions  Medication Instructions:  Your physician has recommended you make the following change in your medication:   metolazone (ZAROXOLYN) 2.5 MG tablet - Take 1 tablet (2.5 mg total) by mouth 3 (three) times a week. Monday Wednesday and Friday  Start today and take for 3 days then - Monday, Wednesday and Friday  *If you need a refill on your cardiac medications before your next appointment, please call your pharmacy*  Lab Work: Your physician recommends that you return for lab work in 10 days (Monday 06/23/23) : BMET Medical Mall Entrance at [facility] 1st desk on the right to check in (REGISTRATION)  Lab hours: Monday- Friday (7:30 am- 5:30 pm)  If you have labs (blood work) drawn today and your tests are completely normal, you will receive your results only by: MyChart Message (if you have MyChart) OR A paper copy in the mail If you have any lab test that is abnormal or we need to change your treatment, we will call you to review the results.  Testing/Procedures: -None ordered   Follow-Up: At [facility], you and your health needs are our priority.  As  part of our continuing mission to provide you with exceptional heart care, we have created designated Provider Care Teams.  These Care Teams include your primary Cardiologist (physician) and Advanced Practice Providers (APPs -  Physician Assistants and Nurse Practitioners) who all work together to provide you with the care you need, when you need it.  Your next appointment:   6 week(s)  Provider:   You may see Debbe Odea, MD or one of the following Advanced Practice Providers on your designated Care Team:   Nicolasa Ducking, NP Eula Listen, PA-C Cadence Fransico Michael, PA-C Charlsie Quest, NP    Other Instructions - AMB referral to CHF clinic   Signed, Debbe Odea, MD  06/11/2023 10:09 AM    [facility]

## 2023-06-11 NOTE — Patient Instructions (Addendum)
Medication Instructions:  Your physician has recommended you make the following change in your medication:   metolazone (ZAROXOLYN) 2.5 MG tablet - Take 1 tablet (2.5 mg total) by mouth 3 (three) times a week. Monday Wednesday and Friday  Start today and take for 3 days then - Monday, Wednesday and Friday  *If you need a refill on your cardiac medications before your next appointment, please call your pharmacy*  Lab Work: Your physician recommends that you return for lab work in 10 days (Monday 06/23/23) : BMET Medical Mall Entrance at [facility] 1st desk on the right to check in (REGISTRATION)  Lab hours: Monday- Friday (7:30 am- 5:30 pm)  If you have labs (blood work) drawn today and your tests are completely normal, you will receive your results only by: MyChart Message (if you have MyChart) OR A paper copy in the mail If you have any lab test that is abnormal or we need to change your treatment, we will call you to review the results.  Testing/Procedures: -None ordered   Follow-Up: At [facility], you and your health needs are our priority.  As part of our continuing mission to provide you with exceptional heart care, we have created designated Provider Care Teams.  These Care Teams include your primary Cardiologist (physician) and Advanced Practice Providers (APPs -  Physician Assistants and Nurse Practitioners) who all work together to provide you with the care you need, when you need it.  Your next appointment:   6 week(s)  Provider:   You may see Debbe Odea, MD or one of the following Advanced Practice Providers on your designated Care Team:   Nicolasa Ducking, NP Eula Listen, PA-C Cadence Fransico Michael, PA-C Charlsie Quest, NP    Other Instructions - AMB referral to CHF clinic

## 2023-06-16 ENCOUNTER — Encounter: Payer: Medicare PPO | Admitting: Physician Assistant

## 2023-06-16 ENCOUNTER — Ambulatory Visit: Payer: Medicare PPO | Admitting: Medical

## 2023-06-16 DIAGNOSIS — I5042 Chronic combined systolic (congestive) and diastolic (congestive) heart failure: Secondary | ICD-10-CM | POA: Diagnosis not present

## 2023-06-16 DIAGNOSIS — Z7901 Long term (current) use of anticoagulants: Secondary | ICD-10-CM | POA: Diagnosis not present

## 2023-06-16 DIAGNOSIS — L97822 Non-pressure chronic ulcer of other part of left lower leg with fat layer exposed: Secondary | ICD-10-CM | POA: Diagnosis not present

## 2023-06-16 DIAGNOSIS — I480 Paroxysmal atrial fibrillation: Secondary | ICD-10-CM | POA: Diagnosis not present

## 2023-06-16 DIAGNOSIS — I872 Venous insufficiency (chronic) (peripheral): Secondary | ICD-10-CM | POA: Diagnosis not present

## 2023-06-16 DIAGNOSIS — I110 Hypertensive heart disease with heart failure: Secondary | ICD-10-CM | POA: Diagnosis not present

## 2023-06-16 DIAGNOSIS — S81812A Laceration without foreign body, left lower leg, initial encounter: Secondary | ICD-10-CM | POA: Diagnosis not present

## 2023-06-16 DIAGNOSIS — Z7902 Long term (current) use of antithrombotics/antiplatelets: Secondary | ICD-10-CM | POA: Diagnosis not present

## 2023-06-16 DIAGNOSIS — Z950 Presence of cardiac pacemaker: Secondary | ICD-10-CM | POA: Diagnosis not present

## 2023-06-16 NOTE — Progress Notes (Addendum)
[name], [name] ([id number]) [id number] Page 1 of 7 Visit Report for 06/16/2023 Chief Complaint Document Details Patient Name: Date of Service: [name] 06/16/2023 3:45 PM Medical Record Number: [id number] Patient Account Number: [id number] Date of Birth/Sex: Treating RN: 1925-03-21 (89 y.o. Freddy Finner Primary Care Provider: Merita Norton Other Clinician: Betha Loa Referring Provider: Treating Provider/Extender: Dory Larsen in Treatment: 6 Information Obtained from: Patient Chief Complaint Left LE ulcer Electronic Signature(s) Signed: 06/16/2023 3:50:53 PM By: Allen Derry PA-C Entered By: Allen Derry on 06/16/2023 15:50:53 -------------------------------------------------------------------------------- HPI Details Patient Name: Date of Service: [name] 06/16/2023 3:45 PM Medical Record Number: [id number] Patient Account Number: [id number] Date of Birth/Sex: Treating RN: 04-05-1925 (89 y.o. Freddy Finner Primary Care Provider: Merita Norton Other Clinician: Betha Loa Referring Provider: Treating Provider/Extender: Dory Larsen in Treatment: 6 History of Present Illness HPI Description: 03-19-2022 upon evaluation today patient appears to be doing poorly in regard to the left posterior lower extremity ulcer. This is something that she actually struck on a car door getting out of the car and this was around January 18, 2022. She was going to urgent care due to her leg swelling when this occurred. Since that time she has had a tremendous amount of bleeding she also did have recommendations for medicated gauze to be used by urgent care. Initially she actually apparently had sutures placed over this area but then when the sutures were removed this dehisced to some degree. She was placed on Omnicef in the past there is no signs of infection right now but again that is definitely something we will need to keep a  close eye on especially with the depth of the wound. The good news that she does not seem to be having any pain which is excellent. And again right now they have been using medicated gauze packing into the wound. She did have an x-ray on February 03, 2022 which was negative for any signs of bone abnormality which is great news. Patient does have a history of chronic venous insufficiency/hypertension with the lower extremity ulcer now at this point. 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 03-25-2022 upon evaluation today patient appears to be doing better in regard to the wound on her leg. She has been tolerating the dressing changes without complication. Fortunately I do not see any signs of active infection locally or systemically at this time which is great news. 04-01-2022 upon evaluation today patient appears to be doing well with regard to her right leg. Fortunately there does not appear to be any evidence of active infection locally nor systemically which is great news and overall I am extremely pleased with where we stand today. I do believe that she is making progress there is still some need for sharp debridement at this point. [place], [name] ([id number]) [id number] Page 2 of 7 04-08-2022 upon evaluation today patient actually appears to be making excellent progress. I am actually very pleased with where we stand today. I do not see any signs of active infection locally or systemically which is great news. No fevers, chills, nausea, vomiting, or diarrhea. 5/8 using endoform which was started last week. Wound appears smaller with less depth. 04-22-2022 upon evaluation today patient appears to be doing well in regard to a large portion of her wound although there is one area that does have me concerned about the possibility of skin cancer. I  do think that we need to take a sample  biopsy to send for evaluation at this point. Patient is in agreement with that plan. Her family member who is with her states that she is also prone to skin cancers she has had 1 recently removed at the skin surgery center in [place]. They did give [place] the number to that location if indeed we need to end up making a referral. For now we will get a see how things go and what the biopsy shows initially. 04-29-2022 upon evaluation today patient appears to be doing well currently in regard to her wound in fact it actually appears to be a lot better especially after last week's biopsy. Fortunately there does not appear to be any signs of active infection locally or systemically at this time which is great news. No fevers, chills, nausea, vomiting, or diarrhea. Upon inspection the patient actually showed signs of complete resolution based on what we are seeing currently. Fortunately I do not see any evidence of infection which is great news she does have an area that is distal to where she had the original wound and this is opened today and appears to be an area of irritation unfortunately. Fortunately there does not appear to be any signs of active infection locally or systemically which is great news. Her appointment is July 14 for the skin surgery center.Marland Kitchen 05-27-2022 upon evaluation today patient appears to be doing well currently in regard to her wound in fact this appears to be completely healed which is great news. I see nothing open including the area where she had the skin cancer this is actually completely healed. In fact I cannot even tell personally where this was biopsied previously. Readmission: 04-29-2023 upon evaluation today patient appears to be doing somewhat poorly in regard to her wound over her left anterior lower extremity. She has been tolerating the dressing changes she unfortunately had a injury where she fell and had a contusion that occurred with a skin tear on 04-08-2023. Since  that time she has been having ongoing issues with this not really improving as well and as quickly as they would like to see. Fortunately there does not appear to be any signs of active infection at this time which is great news and overall I am extremely pleased with where she stands. Patient does have a history of hypertension, atrial fibrillation for which she is on anticoagulant therapy, and congestive heart failure. 05-09-2023 upon evaluation today patient appears to be doing excellent in regard to her wound which is actually showing signs of good improvement I am much happier with where things stand today compared to previous. 05-19-2023 upon evaluation today patient appears to be doing well currently in regard to her wound. She has been tolerating the dressing changes without complication. In fact I really feel like we are showing signs of excellent improvement at this point. I see no signs of infection which is great news. 05-27-2023 upon evaluation today patient appears to be doing well currently in regard to her wound. She has been tolerating the dressing changes without complication. Fortunately there does not appear to be any signs of infection actually think that she is making really good progress here towards healing. I think we are going to switch over to the collagen today. 06-02-2023 upon evaluation today patient appears to be doing well currently in regard to her wound. This is actually showing signs of excellent improvement I do believe the collagen is doing a good job  here. Fortunately I do not see any evidence of active infection locally or systemically which is great news. No fevers, chills, nausea, vomiting, or diarrhea. 06-10-2023 upon evaluation today patient appears to be doing well currently in regard to her wound which is actually showing signs of excellent improvement. Fortunately I do not see any evidence of active infection locally or systemically which is great news and in  general I do think that we are moving in the right direction here. Though the wound is doing great her breathing is not doing as good she does have an appointment with her cardiologist tomorrow I am afraid she may be in a congestive heart failure exacerbation. 06-16-2023 upon evaluation today patient appears to be doing well currently in regard to her wound in fact this appears to be completely healed. Electronic Signature(s) Signed: 06/16/2023 4:52:14 PM By: Allen Derry PA-C Entered By: Allen Derry on 06/16/2023 16:52:14 -------------------------------------------------------------------------------- Physical Exam Details Patient Name: Date of Service: ALLEE, BUSK 06/16/2023 3:45 PM Medical Record Number: [id number] Patient Account Number: [id number] Date of Birth/Sex: Treating RN: 1925/01/24 (89 y.o. Freddy Finner Primary Care Provider: Merita Norton Other Clinician: Betha Loa Referring Provider: Treating Provider/Extender: Dory Larsen in Treatment: 6 Constitutional Well-nourished and well-hydrated in no acute distress. [place], [name] ([id number]) [id number] Page 3 of 7 Respiratory normal breathing without difficulty. Psychiatric this patient is able to make decisions and demonstrates good insight into disease process. Alert and Oriented x 3. pleasant and cooperative. Notes Upon inspection patient's wound appeared to be completely healed there was brand-new skin we still need to protect this but other than that she really seems to be doing quite well which is excellent news at this point. Electronic Signature(s) Signed: 06/16/2023 4:52:24 PM By: Allen Derry PA-C Entered By: Allen Derry on 06/16/2023 16:52:24 -------------------------------------------------------------------------------- Physician Orders Details Patient Name: Date of Service: KYMIA, SIMI 06/16/2023 3:45 PM Medical Record Number: [id number] Patient Account Number:  [id number] Date of Birth/Sex: Treating RN: [birth year] (89 y.o. Freddy Finner Primary Care Provider: Merita Norton Other Clinician: Betha Loa Referring Provider: Treating Provider/Extender: Dory Larsen in Treatment: 6 Verbal / Phone Orders: Yes Clinician: Yevonne Pax Read Back and Verified: Yes Diagnosis Coding ICD-10 Coding Code Description S80.12XA Contusion of left lower leg, initial encounter L97.822 Non-pressure chronic ulcer of other part of left lower leg with fat layer exposed I50.42 Chronic combined systolic (congestive) and diastolic (congestive) heart failure I10 Essential (primary) hypertension I48.0 Paroxysmal atrial fibrillation Z79.01 Long term (current) use of anticoagulants Discharge From [facility] Services Discharge from Wound Care Center Treatment Complete - cover healed wound with bandage for the next week for protection Wear compression garments daily. Put garments on first thing when you wake up and remove them before bed. Electronic Signature(s) Signed: 06/16/2023 5:05:34 PM By: Allen Derry PA-C Signed: 06/23/2023 4:36:38 PM By: Betha Loa Entered By: Betha Loa on 06/16/2023 16:20:07 [name] ([id number]) [id number] Page 4 of 7 -------------------------------------------------------------------------------- Problem List Details Patient Name: Date of Service: AMIRI, TRITCH 06/16/2023 3:45 PM Medical Record Number: [id number] Patient Account Number: [id number] Date of Birth/Sex: Treating RN: Aug 22, 1925 (89 y.o. Freddy Finner Primary Care Provider: Merita Norton Other Clinician: Betha Loa Referring Provider: Treating Provider/Extender: Dory Larsen in Treatment: 6 Active Problems ICD-10 Encounter Code Description Active Date MDM Diagnosis S80.12XA Contusion of left lower leg, initial encounter 04/29/2023 No Yes L97.822 Non-pressure chronic ulcer of other part of left lower leg  with fat layer exposed5/21/2024  No Yes I50.42 Chronic combined systolic (congestive) and diastolic (congestive) heart failure 04/29/2023 No Yes I10 Essential (primary) hypertension 04/29/2023 No Yes I48.0 Paroxysmal atrial fibrillation 04/29/2023 No Yes Z79.01 Long term (current) use of anticoagulants 04/29/2023 No Yes Inactive Problems Resolved Problems Electronic Signature(s) Signed: 06/16/2023 3:50:50 PM By: Allen Derry PA-C Entered By: Allen Derry on 06/16/2023 15:50:50 -------------------------------------------------------------------------------- Progress Note Details Patient Name: Date of Service: Kylie LUS, KRIEGEL 06/16/2023 3:45 PM Medical Record Number: [id number] Patient Account Number: [id number] Date of Birth/Sex: Treating RN: Nov 30, 1925 (89 y.o. Freddy Finner Primary Care Provider: Merita Norton Other Clinician: Betha Loa Referring Provider: Treating Provider/Extender: Dory Larsen in Treatment: 6 Subjective Chief Complaint Information obtained from Patient JAKALYN, KRATKY ([id number]) [id number] Page 5 of 7 Left LE ulcer History of Present Illness (HPI) 03-19-2022 upon evaluation today patient appears to be doing poorly in regard to the left posterior lower extremity ulcer. This is something that she actually struck on a car door getting out of the car and this was around January 18, 2022. She was going to urgent care due to her leg swelling when this occurred. Since that time she has had a tremendous amount of bleeding she also did have recommendations for medicated gauze to be used by urgent care. Initially she actually apparently had sutures placed over this area but then when the sutures were removed this dehisced to some degree. She was placed on Omnicef in the past there is no signs of infection right now but again that is definitely something we will need to keep a close eye on especially with the depth of the wound. The  good news that she does not seem to be having any pain which is excellent. And again right now they have been using medicated gauze packing into the wound. She did have an x-ray on February 03, 2022 which was negative for any signs of bone abnormality which is great news. Patient does have a history of chronic venous insufficiency/hypertension with the lower extremity ulcer now at this point. 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 03-25-2022 upon evaluation today patient appears to be doing better in regard to the wound on her leg. She has been tolerating the dressing changes without complication. Fortunately I do not see any signs of active infection locally or systemically at this time which is great news. 04-01-2022 upon evaluation today patient appears to be doing well with regard to her right leg. Fortunately there does not appear to be any evidence of active infection locally nor systemically which is great news and overall I am extremely pleased with where we stand today. I do believe that she is making progress there is still some need for sharp debridement at this point. 04-08-2022 upon evaluation today patient actually appears to be making excellent progress. I am actually very pleased with where we stand today. I do not see any signs of active infection locally or systemically which is great news. No fevers, chills, nausea, vomiting, or diarrhea. 5/8 using endoform which was started last week. Wound appears smaller with less depth. 04-22-2022 upon evaluation today patient appears to be doing well in regard to a large portion of her wound although there is one area that does have me concerned about the possibility of skin cancer. I do think that we need to take a sample biopsy to send for evaluation at this point. Patient is in agreement with that  plan. Her family member who is with her states that she is  also prone to skin cancers she has had 1 recently removed at the skin surgery center in [place]. They did give [place] the number to that location if indeed we need to end up making a referral. For now we will get a see how things go and what the biopsy shows initially. 04-29-2022 upon evaluation today patient appears to be doing well currently in regard to her wound in fact it actually appears to be a lot better especially after last week's biopsy. Fortunately there does not appear to be any signs of active infection locally or systemically at this time which is great news. No fevers, chills, nausea, vomiting, or diarrhea. Upon inspection the patient actually showed signs of complete resolution based on what we are seeing currently. Fortunately I do not see any evidence of infection which is great news she does have an area that is distal to where she had the original wound and this is opened today and appears to be an area of irritation unfortunately. Fortunately there does not appear to be any signs of active infection locally or systemically which is great news. Her appointment is July 14 for the skin surgery center.Marland Kitchen 05-27-2022 upon evaluation today patient appears to be doing well currently in regard to her wound in fact this appears to be completely healed which is great news. I see nothing open including the area where she had the skin cancer this is actually completely healed. In fact I cannot even tell personally where this was biopsied previously. Readmission: 04-29-2023 upon evaluation today patient appears to be doing somewhat poorly in regard to her wound over her left anterior lower extremity. She has been tolerating the dressing changes she unfortunately had a injury where she fell and had a contusion that occurred with a skin tear on 04-08-2023. Since that time she has been having ongoing issues with this not really improving as well and as quickly as they would like to see. Fortunately  there does not appear to be any signs of active infection at this time which is great news and overall I am extremely pleased with where she stands. Patient does have a history of hypertension, atrial fibrillation for which she is on anticoagulant therapy, and congestive heart failure. 05-09-2023 upon evaluation today patient appears to be doing excellent in regard to her wound which is actually showing signs of good improvement I am much happier with where things stand today compared to previous. 05-19-2023 upon evaluation today patient appears to be doing well currently in regard to her wound. She has been tolerating the dressing changes without complication. In fact I really feel like we are showing signs of excellent improvement at this point. I see no signs of infection which is great news. 05-27-2023 upon evaluation today patient appears to be doing well currently in regard to her wound. She has been tolerating the dressing changes without complication. Fortunately there does not appear to be any signs of infection actually think that she is making really good progress here towards healing. I think we are going to switch over to the collagen today. 06-02-2023 upon evaluation today patient appears to be doing well currently in regard to her wound. This is actually showing signs of excellent improvement I do believe the collagen is doing a good job here. Fortunately I do not see any evidence of active infection locally or systemically which is great news. No fevers, chills, nausea, vomiting,  or diarrhea. 06-10-2023 upon evaluation today patient appears to be doing well currently in regard to her wound which is actually showing signs of excellent improvement. Fortunately I do not see any evidence of active infection locally or systemically which is great news and in general I do think that we are moving in the right direction here. Though the wound is doing great her breathing is not doing as good she  does have an appointment with her cardiologist tomorrow I am afraid she may be in a congestive heart failure exacerbation. 06-16-2023 upon evaluation today patient appears to be doing well currently in regard to her wound in fact this appears to be completely healed. Objective Constitutional Well-nourished and well-hydrated in no acute distress. Vitals Time Taken: 3:48 PM, Weight: 116 lbs, Temperature: 97.6 F, Pulse: 98 bpm, Respiratory Rate: 16 breaths/min, Blood Pressure: 92/57 mmHg. [place], [name] ([id number]) [id number] Page 6 of 7 Respiratory normal breathing without difficulty. Psychiatric this patient is able to make decisions and demonstrates good insight into disease process. Alert and Oriented x 3. pleasant and cooperative. General Notes: Upon inspection patient's wound appeared to be completely healed there was brand-new skin we still need to protect this but other than that she really seems to be doing quite well which is excellent news at this point. Integumentary (Hair, Skin) Wound #3 status is Healed - Epithelialized. Original cause of wound was Trauma. The date acquired was: 04/08/2023. The wound has been in treatment 6 weeks. The wound is located on the Left Lower Leg. The wound measures 0cm length x 0cm width x 0cm depth; 0cm^2 area and 0cm^3 volume. There is Fat Layer (Subcutaneous Tissue) exposed. There is a medium amount of serosanguineous drainage noted. There is medium (34-66%) granulation within the wound bed. There is a medium (34-66%) amount of necrotic tissue within the wound bed. Wound #3 status is Healed - Epithelialized. Original cause of wound was Trauma. The date acquired was: 04/08/2023. The wound has been in treatment 6 weeks. The wound is located on the Left Lower Leg. The wound measures 0cm length x 0cm width x 0cm depth; 0cm^2 area and 0cm^3 volume. There is Fat Layer (Subcutaneous Tissue) exposed. There is a none present amount of  drainage noted. There is no granulation within the wound bed. There is no necrotic tissue within the wound bed. 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 Services: Discharge from Wound Care Center Treatment Complete - cover healed wound with bandage for the next week for protection Wear compression garments daily. Put garments on first thing when you wake up and remove them before bed. 1. Based on what I am seeing I do believe the patient has ready for discharge at this point. I am extremely pleased with how things appear and I do believe that she is at the point where continuing with just a protective dressing and compression stockings sufficient. 2. I do not see anything open and in fact I think the biggest thing that she is protecting this area toughen is up over the next week. We will see patient back for reevaluation in 1 week here in the clinic. If anything worsens or changes patient will contact our office for additional recommendations. Electronic Signature(s) Signed: 06/16/2023 4:53:03 PM By: Larina Bras,  Minyon Billiter PA-C Entered By: Allen Derry on 06/16/2023 16:53:03 -------------------------------------------------------------------------------- SuperBill Details Patient Name: Date of Service: EMERSEN, MASCARI 06/16/2023 Medical Record Number: [id number] Patient Account Number: [id number] Date of Birth/Sex: Treating RN: 1925/01/24 (89 y.o. 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 I10 Essential (primary) hypertension I48.0 Paroxysmal atrial fibrillation Z79.01 Long term (current) use of anticoagulants Facility Procedures : CPT4 Code: [id number] Description: 95638 - WOUND CARE VISIT-LEV 2 EST PT Modifier: Quantity: 1 Physician Procedures : CPT4 Code Description Modifier [id number] - WC PHYS LEVEL 3 - EST PT ICD-10 Diagnosis Description S80.12XA Contusion of left lower leg, initial encounter L97.822 Non-pressure chronic ulcer of other part of left lower leg with fat layer exposed  I50.42 Chronic combined systolic (congestive) and diastolic (congestive) heart failure I10 Essential (primary) hypertension Quantity: 1 Electronic Signature(s) Signed: 06/16/2023 4:56:10 PM By: Allen Derry PA-C Entered By: Allen Derry on 06/16/2023 16:56:09

## 2023-06-20 NOTE — Telephone Encounter (Signed)
Patient seen in office on 7/3 and medications reviewed.

## 2023-06-23 ENCOUNTER — Other Ambulatory Visit
Admission: RE | Admit: 2023-06-23 | Discharge: 2023-06-23 | Disposition: A | Discharge status: Home / Self Care | Payer: Medicare PPO | Source: Ambulatory Visit | Attending: Cardiology | Admitting: Cardiology

## 2023-06-23 DIAGNOSIS — I4821 Permanent atrial fibrillation: Secondary | ICD-10-CM | POA: Insufficient documentation

## 2023-06-23 DIAGNOSIS — I5030 Unspecified diastolic (congestive) heart failure: Secondary | ICD-10-CM | POA: Insufficient documentation

## 2023-06-23 LAB — BASIC METABOLIC PANEL
Anion gap: 10 (ref 5–15)
BUN: 97 mg/dL — ABNORMAL HIGH (ref 8–23)
CO2: 32 mmol/L (ref 22–32)
Calcium: 9 mg/dL (ref 8.9–10.3)
Chloride: 85 mmol/L — ABNORMAL LOW (ref 98–111)
Creatinine, Ser: 1.36 mg/dL — ABNORMAL HIGH (ref 0.44–1.00)
GFR, Estimated: 36 mL/min — ABNORMAL LOW (ref >60)
Glucose, Bld: 165 mg/dL — ABNORMAL HIGH (ref 70–99)
Potassium: 3.4 mmol/L — ABNORMAL LOW (ref 3.5–5.1)
Sodium: 127 mmol/L — ABNORMAL LOW (ref 135–145)

## 2023-06-23 NOTE — Progress Notes (Signed)
[name], [name] ([id number]) [id number] Page 1 of 9 Visit Report for 06/16/2023 Arrival Information Details Patient Name: Date of Service: [name] 06/16/2023 3:45 PM Medical Record Number: [id number] Patient Account Number: [id number] Date of Birth/Sex: Treating RN: [name] (89 y.o. [name] Primary Care [name]: [name] Other Clinician: Betha [name] Referring [name]: Treating [name]/Extender: [name] in Treatment: 6 Visit Information History Since Last Visit All ordered tests and consults were completed: No Patient Arrived: [name] Added or deleted any medications: No Arrival Time: 15:47 Any new allergies or adverse reactions: No Transfer Assistance: None Had a fall or experienced change in No Patient Identification Verified: Yes activities of daily living that Kylie affect Secondary Verification Process Completed: Yes risk of falls: Patient Requires Transmission-Based Precautions: No Signs or symptoms of abuse/neglect since last visito No Patient Has Alerts: Yes Hospitalized since last visit: No Patient Alerts: Patient on Blood Thinner Implantable device outside of the clinic excluding No non compressible cellular tissue based products placed in the center since last visit: Has Dressing in Place as Prescribed: Yes Pain Present Now: No Electronic Signature(s) Signed: 06/23/2023 4:36:38 PM By: [name] Entered By: [name] on 06/16/2023 15:48:22 -------------------------------------------------------------------------------- Clinic Level of Care Assessment Details Patient Name: Date of ServiceGaylyn Rong [name], [name] 06/16/2023 3:45 PM Medical Record Number: [id number] Patient Account Number: [id number] Date of Birth/Sex: Treating RN: [name] (89 y.o. [name] Primary Care [name]: [name] Other Clinician: Betha [name] Referring [name]: Treating [name]/Extender: [name] in Treatment: 6 Clinic Level of Care Assessment Items TOOL 4 Quantity Score []  - 0 Use when only an EandM is performed on FOLLOW-UP visit ASSESSMENTS - Nursing Assessment / Reassessment X- 1 10 Reassessment of Co-morbidities (includes updates in patient status) X- 1 5 Reassessment of Adherence to Treatment Plan Grampian, [name] ([id number]) [phone] Page 2 of 9 ASSESSMENTS - Wound and Skin A ssessment / Reassessment X - Simple Wound Assessment / Reassessment - one wound 1 5 []  - 0 Complex Wound Assessment / Reassessment - multiple wounds []  - 0 Dermatologic / Skin Assessment (not related to wound area) ASSESSMENTS - Focused Assessment []  - 0 Circumferential Edema Measurements - multi extremities []  - 0 Nutritional Assessment / Counseling / Intervention []  - 0 Lower Extremity Assessment (monofilament, tuning fork, pulses) []  - 0 Peripheral Arterial Disease Assessment (using hand held doppler) ASSESSMENTS - Ostomy and/or Continence Assessment and Care []  - 0 Incontinence Assessment and Management []  - 0 Ostomy Care Assessment and Management (repouching, etc.) PROCESS - Coordination of Care X - Simple Patient / Family Education for ongoing care 1 15 []  - 0 Complex (extensive) Patient / Family Education for ongoing care []  - 0 Staff obtains Chiropractor, Records, T Results / Process Orders est []  - 0 Staff telephones HHA, Nursing Homes / Clarify orders / etc []  - 0 Routine Transfer to another Facility (non-emergent condition) []  - 0 Routine Hospital Admission (non-emergent condition) []  - 0 New Admissions / Manufacturing engineer / Ordering NPWT Apligraf, etc. , []  - 0 Emergency Hospital Admission (emergent condition) X- 1 10 Simple Discharge Coordination []  - 0 Complex (extensive) Discharge Coordination PROCESS - Special Needs []  - 0 Pediatric / Minor Patient Management []  - 0 Isolation Patient Management []  - 0 Hearing /  Language / Visual special needs []  - 0 Assessment of Community assistance (transportation, D/C planning, etc.) []  - 0 Additional assistance / Altered mentation []  - 0 Support Surface(s) Assessment (bed, cushion, seat, etc.)  INTERVENTIONS - Wound Cleansing / Measurement X - Simple Wound Cleansing - one wound 1 5 []  - 0 Complex Wound Cleansing - multiple wounds X- 1 5 Wound Imaging (photographs - any number of wounds) []  - 0 Wound Tracing (instead of photographs) X- 1 5 Simple Wound Measurement - one wound []  - 0 Complex Wound Measurement - multiple wounds INTERVENTIONS - Wound Dressings X - Small Wound Dressing one or multiple wounds 1 10 []  - 0 Medium Wound Dressing one or multiple wounds []  - 0 Large Wound Dressing one or multiple wounds []  - 0 Application of Medications - topical []  - 0 Application of Medications - injection INTERVENTIONS - Miscellaneous []  - 0 External ear exam [name], [name] ([id number]) [id number] Page 3 of 9 []  - 0 Specimen Collection (cultures, biopsies, blood, body fluids, etc.) []  - 0 Specimen(s) / Culture(s) sent or taken to Lab for analysis []  - 0 Patient Transfer (multiple staff / Michiel Sites Lift / Similar devices) []  - 0 Simple Staple / Suture removal (25 or less) []  - 0 Complex Staple / Suture removal (26 or more) []  - 0 Hypo / Hyperglycemic Management (close monitor of Blood Glucose) []  - 0 Ankle / Brachial Index (ABI) - do not check if billed separately X- 1 5 Vital Signs Has the patient been seen at the hospital within the last three years: Yes Total Score: 75 Level Of Care: New/Established - Level 2 Electronic Signature(s) Signed: 06/23/2023 4:36:38 PM By: [name] Entered By: [name] on 06/16/2023 16:20:31 -------------------------------------------------------------------------------- Encounter Discharge Information Details Patient Name: Date of Service: [name] 06/16/2023 3:45  PM Medical Record Number: [id number] Patient Account Number: [id number] Date of Birth/Sex: Treating RN: [name] (89 y.o. [name] Primary Care [name]: [name] Other Clinician: Betha [name] Referring Kylie Jelinek: Treating Kylie Mcgeehan/Extender: [name] in Treatment: 6 Encounter Discharge Information Items Discharge Condition: Stable Ambulatory Status: Walker Discharge Destination: Home Transportation: Private Auto Accompanied By: daughter Schedule Follow-up Appointment: Yes Clinical Summary of Care: Electronic Signature(s) Signed: 06/23/2023 4:36:38 PM By: [name] Entered By: [name] on 06/16/2023 16:37:17 Lower Extremity Assessment Details -------------------------------------------------------------------------------- [name] ([id number]) [id number] Page 4 of 9 Patient Name: Date of Service: [name], [name] 06/16/2023 3:45 PM Medical Record Number: [id number] Patient Account Number: [id number] Date of Birth/Sex: Treating RN: [name] (89 y.o. [name] Primary Care Wister Hoefle: [name] Other Clinician: Betha [name] Referring Karlina Suares: Treating Zolton Dowson/Extender: [name] in Treatment: 6 Edema Assessment Left: Right: Assessed: Yes No Edema: Yes Calf Left: Right: Point of Measurement: 38 cm From Medial Instep 31.5 cm Ankle Left: Right: Point of Measurement: 10 cm From Medial Instep 24 cm Vascular Assessment Left: Right: Pulses: Dorsalis Pedis Palpable: Yes Electronic Signature(s) Signed: 06/19/2023 1:44:50 PM By: Yevonne Pax RN Signed: 06/23/2023 4:36:38 PM By: [name] Entered By: [name] on 06/16/2023 15:58:18 -------------------------------------------------------------------------------- Multi Wound Chart Details Patient Name: Date of Service: HA Kylie [place] [facility] 06/16/2023 3:45 PM Medical Record Number: [id number] Patient Account Number:  [id number] Date of Birth/Sex: Treating RN: [name] (89 y.o. [name] Primary Care Cadel Stairs: [name] Other Clinician: Betha [name] Referring Matthias Bogus: Treating Donnice Nielsen/Extender: [name] in Treatment: 6 Vital Signs Height(in): Pulse(bpm): 98 Weight(lbs): 116 Blood Pressure(mmHg): 92/57 Body Mass Index(BMI): Temperature(F): 97.6 Respiratory Rate(breaths/min): 16 [3:Photos:] [N/A:N/A] Left Lower Leg N/A N/A Wound Location: [name] ([id number]) [id number] Page 5 of 9 Trauma N/A N/A Wounding Event: Skin T ear N/A N/A Primary Etiology: Congestive Heart Failure,  N/A N/A Comorbid History: Hypertension 04/08/2023 N/A N/A Date Acquired: 6 N/A N/A Weeks of Treatment: Open N/A N/A Wound Status: No N/A N/A Wound Recurrence: 0.2x0.1x0.1 N/A N/A Measurements L x W x D (cm) 0.016 N/A N/A A (cm) : rea 0.002 N/A N/A Volume (cm) : 99.50% N/A N/A % Reduction in Area: 99.70% N/A N/A % Reduction in Volume: Full Thickness Without Exposed N/A N/A Classification: Support Structures Medium N/A N/A Exudate Amount: Serosanguineous N/A N/A Exudate Type: red, brown N/A N/A Exudate Color: Medium (34-66%) N/A N/A Granulation Amount: Medium (34-66%) N/A N/A Necrotic Amount: Fat Layer (Subcutaneous Tissue): Yes N/A N/A Exposed Structures: Fascia: No Tendon: No Muscle: No Joint: No Bone: No None N/A N/A Epithelialization: Treatment Notes Electronic Signature(s) Signed: 06/23/2023 4:36:38 PM By: [name] Entered By: [name] on 06/16/2023 15:58:32 -------------------------------------------------------------------------------- Multi-Disciplinary Care Plan Details Patient Name: Date of Service: [name] [facility] 06/16/2023 3:45 PM Medical Record Number: [id number] Patient Account Number: [id number] Date of Birth/Sex: Treating RN: [birth year] (89 y.o. [name] Primary Care Tona Qualley: [name] Other Clinician: Betha [name] Referring Zaela Graley: Treating Domingos Riggi/Extender: [name] in Treatment: 6 Active Inactive Electronic Signature(s) Signed: 06/19/2023 1:44:50 PM By: Yevonne Pax RN Signed: 06/23/2023 4:36:38 PM By: [name] Entered By: [name] on 06/16/2023 16:20:52 Stetler, [name] ([id number]) [id number] Page 6 of 9 -------------------------------------------------------------------------------- Pain Assessment Details Patient Name: Date of Service: [name], [name] 06/16/2023 3:45 PM Medical Record Number: [id number] Patient Account Number: [id number] Date of Birth/Sex: Treating RN: [birth year] (89 y.o. [name] Primary Care Robbyn Hodkinson: [name] Other Clinician: Betha [name] Referring Welma Mccombs: Treating Maliq Pilley/Extender: [name] in Treatment: 6 Active Problems Location of Pain Severity and Description of Pain Patient Has Paino No Site Locations Pain Management and Medication Current Pain Management: Electronic Signature(s) Signed: 06/19/2023 1:44:50 PM By: Yevonne Pax RN Signed: 06/23/2023 4:36:38 PM By: [name] Entered By: [name] on 06/16/2023 15:48:50 -------------------------------------------------------------------------------- Patient/Caregiver Education Details Patient Name: Date of Service: [name] 7/8/2024andnbsp3:45 PM Medical Record Number: [id number] Patient Account Number: [id number] Date of Birth/Gender: Treating RN: [name] (89 y.o. [name] Primary Care Physician: [name] Other Clinician: Betha [name] Referring Physician: Treating Physician/Extender: [name] in Treatment: 6 [place], [name] ([id number]) [phone] Page 7 of 9 Education Assessment Education Provided To: Patient Education Topics Provided Wound/Skin Impairment: Handouts: Other: Congratulations, your  wound has healed. Please call if any further issues arise Methods: Explain/Verbal Responses: State content correctly Electronic Signature(s) Signed: 06/23/2023 4:36:38 PM By: [name] Entered By: [name] on 06/16/2023 16:24:37 -------------------------------------------------------------------------------- Wound Assessment Details Patient Name: Date of Service: [name], [name] 06/16/2023 3:45 PM Medical Record Number: [id number] Patient Account Number: [id number] Date of Birth/Sex: Treating RN: [name] (89 y.o. [name] Primary Care Hymen Arnett: [name] Other Clinician: Betha [name] Referring Zein Helbing: Treating Willie Loy/Extender: [name] in Treatment: 6 Wound Status Wound Number: 3 Primary Etiology: Skin Tear Wound Location: Left Lower Leg Wound Status: Healed - Epithelialized Wounding Event: Trauma Comorbid History: Congestive Heart Failure, Hypertension Date Acquired: 04/08/2023 Weeks Of Treatment: 6 Clustered Wound: No Photos Wound Measurements Length: (cm) Width: (cm) Depth: (cm) Area: (cm) Volume: (cm) 0 % Reduction in Area: 100% 0 % Reduction in Volume: 100% 0 Epithelialization: None 0 0 Wound Description Classification: Full Thickness Without Exposed Support Structures Exudate Amount: Medium Exudate Type: Serosanguineous [place], [name] ([id number]) Exudate Color: red, brown Foul Odor After Cleansing: No Slough/Fibrino Yes [id number] Page 8 of  9 Wound Bed Granulation Amount: Medium (34-66%) Exposed Structure Necrotic Amount: Medium (34-66%) Fascia Exposed: No Fat Layer (Subcutaneous Tissue) Exposed: Yes Tendon Exposed: No Muscle Exposed: No Joint Exposed: No Bone Exposed: No Electronic Signature(s) Signed: 06/19/2023 1:44:50 PM By: Yevonne Pax RN Signed: 06/23/2023 4:36:38 PM By: [name] Entered By: [name] on 06/16/2023  16:18:30 -------------------------------------------------------------------------------- Wound Assessment Details Patient Name: Date of Service: [name], [name] 06/16/2023 3:45 PM Medical Record Number: [id number] Patient Account Number: [id number] Date of Birth/Sex: Treating RN: [birth year][name] (89 y.o. [name] Primary Care Islah Eve: [name] Other Clinician: Betha [name] Referring Keilynn Marano: Treating Harout Scheurich/Extender: [name] in Treatment: 6 Wound Status Wound Number: 3 Primary Etiology: Skin Tear Wound Location: Left Lower Leg Wound Status: Healed - Epithelialized Wounding Event: Trauma Comorbid History: Congestive Heart Failure, Hypertension Date Acquired: 04/08/2023 Weeks Of Treatment: 6 Clustered Wound: No Photos Wound Measurements Length: (cm) Width: (cm) Depth: (cm) Area: (cm) Volume: (cm) 0 % Reduction in Area: 100% 0 % Reduction in Volume: 100% 0 Epithelialization: Large (67-100%) 0 0 Wound Description Classification: Full Thickness Without Exposed Support Exudate Amount: None Present Durney, Jorja ([id number]) Wound Bed Granulation Amount: None Present (0%) Necrotic Amount: None Present (0%) Structures Foul Odor After Cleansing: No Slough/Fibrino No [id number] Page 9 of 9 Exposed Structure Fascia Exposed: No Fat Layer (Subcutaneous Tissue) Exposed: Yes Tendon Exposed: No Muscle Exposed: No Joint Exposed: No Bone Exposed: No Treatment Notes Wound #3 (Lower Leg) Wound Laterality: Left Cleanser Peri-Wound Care Topical Primary Dressing Secondary Dressing Secured With Compression Wrap Compression Stockings Add-Ons Electronic Signature(s) Signed: 06/19/2023 1:44:50 PM By: Yevonne Pax RN Signed: 06/23/2023 4:36:38 PM By: [name] Entered By: [name] on 06/16/2023 16:18:48 -------------------------------------------------------------------------------- Vitals Details Patient Name:  Date of Service: HA Kylie [place] [facility] 06/16/2023 3:45 PM Medical Record Number: [id number] Patient Account Number: [id number] Date of Birth/Sex: Treating RN: [birth year][name] (89 y.o. [name] Primary Care Saniya Tranchina: [name] Other Clinician: Betha [name] Referring Emeterio Balke: Treating Maquita Sandoval/Extender: [name] in Treatment: 6 Vital Signs Time Taken: 15:48 Temperature (F): 97.6 Weight (lbs): 116 Pulse (bpm): 98 Respiratory Rate (breaths/min): 16 Blood Pressure (mmHg): 92/57 Reference Range: 80 - 120 mg / dl Electronic Signature(s) Signed: 06/23/2023 4:36:38 PM By: [name] Entered By: [name] on 06/16/2023 15:48:42

## 2023-06-24 ENCOUNTER — Telehealth: Payer: Self-pay | Admitting: Cardiology

## 2023-06-24 ENCOUNTER — Other Ambulatory Visit: Payer: Self-pay

## 2023-06-24 DIAGNOSIS — Z79899 Other long term (current) drug therapy: Secondary | ICD-10-CM

## 2023-06-24 MED ORDER — METOLAZONE 2.5 MG PO TABS: 2.5000 mg | ORAL_TABLET | ORAL | Status: DC

## 2023-06-24 NOTE — Telephone Encounter (Signed)
Spoke to patient's daughter and informed of the provider's recommendations following most recent lab results as follows:  "Potassium slightly low, creatinine slightly worse from prior.  Reduce metolazone to 2 times weekly on Mondays and Thursdays.  Recheck BMP in 10 days."  Patient's daughter understood with read back

## 2023-06-24 NOTE — Telephone Encounter (Signed)
 Patient states she was returning call. Please advise  

## 2023-06-24 NOTE — Telephone Encounter (Signed)
Pt daughter returning call for lab results

## 2023-06-29 ENCOUNTER — Other Ambulatory Visit: Payer: Self-pay | Admitting: Family Medicine

## 2023-07-01 ENCOUNTER — Ambulatory Visit: Payer: Medicare PPO | Attending: Family | Admitting: Family

## 2023-07-01 ENCOUNTER — Encounter: Payer: Self-pay | Admitting: Family

## 2023-07-01 VITALS — BP 116/64 | HR 79 | Ht 59.0 in | Wt 113.0 lb

## 2023-07-01 DIAGNOSIS — I4821 Permanent atrial fibrillation: Secondary | ICD-10-CM | POA: Diagnosis not present

## 2023-07-01 DIAGNOSIS — Z79899 Other long term (current) drug therapy: Secondary | ICD-10-CM | POA: Diagnosis not present

## 2023-07-01 DIAGNOSIS — I5030 Unspecified diastolic (congestive) heart failure: Secondary | ICD-10-CM | POA: Diagnosis not present

## 2023-07-01 DIAGNOSIS — Z950 Presence of cardiac pacemaker: Secondary | ICD-10-CM | POA: Insufficient documentation

## 2023-07-01 DIAGNOSIS — I110 Hypertensive heart disease with heart failure: Secondary | ICD-10-CM | POA: Insufficient documentation

## 2023-07-01 DIAGNOSIS — I890 Lymphedema, not elsewhere classified: Secondary | ICD-10-CM | POA: Diagnosis not present

## 2023-07-01 DIAGNOSIS — I4891 Unspecified atrial fibrillation: Secondary | ICD-10-CM | POA: Diagnosis not present

## 2023-07-01 DIAGNOSIS — I5032 Chronic diastolic (congestive) heart failure: Secondary | ICD-10-CM | POA: Insufficient documentation

## 2023-07-01 NOTE — Progress Notes (Signed)
PCP: Merita Norton, FNP (last seen 06/24) Primary Cardiologist: Debbe Odea, MD (last seen 07/24)  HPI:  [name] is a 89 year old female with a history of afib, pacemaker, and chronic heart failure.   Was in the ED on 10/09/22 due to a fall. Was in the ED 04/08/23 due to a mechanical fall.  Echo 02/26/22: EF of 60-65% along with mild LVH, moderately elevated PA pressure, moderate MR, moderate/severe TR, mild/moderate AS and severe LAE.   She presents today for a HF follow-up visit with a chief complaint of minimal fatigue with moderate exertion. Chronic in nature. Has associated intermittent pedal edema along with this. Denies SOB, chest pain, palpitations, cough, dizziness or difficulty sleeping.   Metolazone 2.5mg  three times/ week was started at cardiology office ~ 3 weeks ago due to worsening SOB, edema, weight gain and cough. Subsequent lab work showed worsened renal function so metolazone was decreased to two times/ week. Daughter has since realized that patient was only taking 1 torsemide daily instead of the 2 tablets daily. Symptoms have almost completely gone.   ROS: All systems negative except as listed in HPI, PMH and Problem List.  SH:  Social History   Socioeconomic History   Marital status: Widowed    Spouse name: Not on file   Number of children: Not on file   Years of education: Not on file   Highest education level: Not on file  Occupational History   Not on file  Tobacco Use   Smoking status: Never   Smokeless tobacco: Never  Substance and Sexual Activity   Alcohol use: Not Currently   Drug use: Never   Sexual activity: Not on file  Other Topics Concern   Not on file  Social History Narrative   Not on file   Social Determinants of Health   Financial Resource Strain: Low Risk  (07/04/2022)   Overall Financial Resource Strain (CARDIA)    Difficulty of Paying Living Expenses: Not hard at all  Food Insecurity: No Food Insecurity (07/04/2022)   Hunger  Vital Sign    Worried About Running Out of Food in the Last Year: Never true    Ran Out of Food in the Last Year: Never true  Transportation Needs: No Transportation Needs (07/04/2022)   PRAPARE - Administrator, Civil Service (Medical): No    Lack of Transportation (Non-Medical): No  Physical Activity: Insufficiently Active (07/04/2022)   Exercise Vital Sign    Days of Exercise per Week: 2 days    Minutes of Exercise per Session: 20 min  Stress: No Stress Concern Present (07/04/2022)   [employer] of Occupational Health - Occupational Stress Questionnaire    Feeling of Stress : Not at all  Social Connections: Moderately Isolated (07/04/2022)   Social Connection and Isolation Panel [NHANES]    Frequency of Communication with Friends and Family: More than three times a week    Frequency of Social Gatherings with Friends and Family: More than three times a week    Attends Religious Services: More than 4 times per year    Active Member of [employer] or Organizations: No    Attends Banker Meetings: Never    Marital Status: Widowed  Intimate Partner Violence: Not At Risk (07/04/2022)   Humiliation, Afraid, Rape, and Kick questionnaire    Fear of Current or Ex-Partner: No    Emotionally Abused: No    Physically Abused: No    Sexually Abused: No  FH:  Family History  Problem Relation Age of Onset   Heart failure Mother    Heart attack Father    Heart attack Daughter     Past Medical History:  Diagnosis Date   Arrhythmia    atrial fibrillation   Congestive heart failure (CHF) (HCC)    Hypertension    Pacemaker 2015    Current Outpatient Medications  Medication Sig Dispense Refill   amLODipine (NORVASC) 5 MG tablet Take 1 tablet (5 mg total) by mouth daily. 90 tablet 0   atorvastatin (LIPITOR) 10 MG tablet Take 1 tablet (10 mg total) by mouth daily. Please schedule office visit before any future refill 90 tablet 0   calcium carbonate (OS-CAL - DOSED IN  MG OF ELEMENTAL CALCIUM) 1250 (500 Ca) MG tablet Take 1 tablet by mouth 2 (two) times daily with a meal.     dipyridamole-aspirin (AGGRENOX) 200-25 MG 12hr capsule TAKE 1 CAPSULE BY MOUTH TWICE A DAY 180 capsule 0   empagliflozin (JARDIANCE) 10 MG TABS tablet Take 1 tablet (10 mg total) by mouth daily. 90 tablet 1   loratadine (CLARITIN) 10 MG tablet Take 10 mg by mouth daily.     losartan (COZAAR) 50 MG tablet TAKE 1 TABLET BY MOUTH TWICE A DAY 180 tablet 1   metolazone (ZAROXOLYN) 2.5 MG tablet Take 1 tablet (2.5 mg total) by mouth 3 (three) times a week. Take 1 tablet (2.5 mg) by mouth 2 times a week Monday and Thursday     metoprolol (TOPROL-XL) 200 MG 24 hr tablet TAKE 1 TABLET BY MOUTH EVERY DAY 90 tablet 0   Multiple Vitamins-Minerals (PRESERVISION AREDS 2 PO) Take 1 tablet by mouth in the morning and at bedtime.     multivitamin-iron-minerals-folic acid (CENTRUM) chewable tablet Chew 1 tablet by mouth daily.     Omega-3 Fatty Acids (FISH OIL) 1000 MG CAPS Take 1 capsule by mouth daily.     potassium chloride (KLOR-CON) 10 MEQ tablet Take 10 mEq by mouth as needed. Only if she takes an additional dose of lasix     torsemide (DEMADEX) 20 MG tablet TAKE 2 TABLETS (40 MG TOTAL) BY MOUTH DAILY. AND ADDITIONAL 20MG  IF NEEDED (Patient taking differently: Take 20 mg by mouth daily. With additional 20 mg if needed) 180 tablet 0   No current facility-administered medications for this visit.   Vitals:   07/01/23 1523  BP: 116/64  Pulse: 79  SpO2: 97%  Weight: 113 lb (51.3 kg)  Height: 4\' 11"  (1.499 m)   Wt Readings from Last 3 Encounters:  07/01/23 113 lb (51.3 kg)  06/11/23 119 lb 12.8 oz (54.3 kg)  05/27/23 118 lb (53.5 kg)   Lab Results  Component Value Date   CREATININE 1.36 (H) 06/23/2023   CREATININE 1.02 (H) 05/27/2023   CREATININE 0.87 04/08/2023   PHYSICAL EXAM:  General:  Well appearing. No resp difficulty HEENT: normal Neck: supple. JVP flat. No lymphadenopathy or  thryomegaly appreciated. Cor: PMI normal. Regular rate & rhythm. No rubs, gallops or murmurs. Lungs: clear Abdomen: soft, nontender, nondistended. No hepatosplenomegaly. No bruits or masses.  Extremities: no cyanosis, clubbing, rash, edema Neuro: alert & oriented x3, cranial nerves grossly intact. Moves all 4 extremities w/o difficulty. Affect pleasant.   ECG:not done   ASSESSMENT & PLAN:  Chronic heart failure with preserved ejection fraction with structural changes (LAE)- - NYHA II - euvolemic today - weighing daily; reminded to call for an overnight weight gain of > 2 pounds  or a weekly weight gain of > 5 pounds - weight unchanged from last visit here 4 months ago - goes to an adult day care each day, reports that she eats low sodium diet there - trying to keep daily fluid intake to 60-64 ounces daily - saw cardiology, Dr. Azucena Cecil 07/24 - continue jardiance 10mg  daily - continue losartan 50mg  BID - continue metoprolol succinate 200mg  daily - continue torsemide 40mg  daily and extra 20mg  PRN/ potassium if takes extra torsemide - now that she's back taking torsemide 40mg  daily, if she needs extra diuretic, she can take an additional 20mg  torsemide and if she needs more, she can then take the metolazone 2.5mg  (use the metolazone as PRN) - BNP 05/27/23 was 603.4  Atrial fibrillation - RRR today, has pacemaker - saw EP Lalla Brothers) 06/23 - BMP 06/23/23 reviewed and showed sodium 127, potassium 3.4, creatinine 1.36 & GFR 36 - getting BMP drawn 07/08/23  3: Lymphedema- - stage 2 - wears compression socks daily - is quite active and walked into the office - goes to an adult daycare daily - saw wound center Larina Bras) 07/24 - saw PCP Suzie Portela) 06/24  Return in 3 months, sooner if needed.

## 2023-07-06 ENCOUNTER — Other Ambulatory Visit: Payer: Self-pay | Admitting: Family Medicine

## 2023-07-07 NOTE — Telephone Encounter (Signed)
Requested Prescriptions  Pending Prescriptions Disp Refills   atorvastatin (LIPITOR) 10 MG tablet [Pharmacy Med Name: ATORVASTATIN 10 MG TABLET] 90 tablet 0    Sig: TAKE 1 TABLET (10 MG TOTAL) BY MOUTH DAILY. PLEASE SCHEDULE OFFICE VISIT BEFORE ANY FUTURE REFILL     Cardiovascular:  Antilipid - Statins Failed - 07/06/2023  1:10 AM      Failed - Lipid Panel in normal range within the last 12 months    No results found for: "CHOL", "POCCHOL", "CHOLTOT" No results found for: "LDLCALC", "LDLC", "HIRISKLDL", "POCLDL", "LDLDIRECT", "REALLDLC", "TOTLDLC" No results found for: "HDL", "POCHDL" No results found for: "TRIG", "POCTRIG"       Passed - Patient is not pregnant      Passed - Valid encounter within last 12 months    Recent Outpatient Visits           1 month ago Viral URI with cough   [facility] Merita Norton T, FNP   2 months ago Skin tear of left lower leg without complication, sequela   [facility] [name], FNP   9 months ago Annual physical exam   [facility] Merita Norton T, FNP   1 year ago Cough, unspecified type   [facility] Malva Limes, MD   1 year ago Irritability   [facility] [place], [place], [place]       Future Appointments             In 4 weeks Fransico Michael, Cadence H, PA-C [facility] [place]

## 2023-07-08 ENCOUNTER — Ambulatory Visit (INDEPENDENT_AMBULATORY_CARE_PROVIDER_SITE_OTHER): Payer: Medicare PPO

## 2023-07-08 VITALS — Ht 59.0 in | Wt 113.0 lb

## 2023-07-08 DIAGNOSIS — Z0000 Encounter for general adult medical examination without abnormal findings: Secondary | ICD-10-CM | POA: Diagnosis not present

## 2023-07-08 NOTE — Patient Instructions (Addendum)
[name] , Thank you for taking time to come for your Medicare Wellness Visit. I appreciate your ongoing commitment to your health goals. Please review the following plan we discussed and let me know if I can assist you in the future.   Referrals/Orders/Follow-Ups/Clinician Recommendations: none  This is a list of the screening recommended for you and due dates:  Health Maintenance  Topic Date Due   COVID-19 Vaccine (1) Never done   DTaP/Tdap/Td vaccine (1 - Tdap) Never done   Zoster (Shingles) Vaccine (1 of 2) Never done   DEXA scan (bone density measurement)  Never done   Pneumonia Vaccine (1 of 1 - PCV) 09/27/2023*   Flu Shot  07/10/2023   Medicare Annual Wellness Visit  07/07/2024   HPV Vaccine  Aged Out  *Topic was postponed. The date shown is not the original due date.    Advanced directives: (In Chart) A copy of your advanced directives are scanned into your chart should your provider ever need it.  Next Medicare Annual Wellness Visit scheduled for next year: Yes 07/13/24 @ 3:30pm telephone  Preventive Care 65 Years and Older, Female Preventive care refers to lifestyle choices and visits with your health care provider that can promote health and wellness. What does preventive care include? A yearly physical exam. This is also called an annual well check. Dental exams once or twice a year. Routine eye exams. Ask your health care provider how often you should have your eyes checked. Personal lifestyle choices, including: Daily care of your teeth and gums. Regular physical activity. Eating a healthy diet. Avoiding tobacco and drug use. Limiting alcohol use. Practicing safe sex. Taking low-dose aspirin every day. Taking vitamin and mineral supplements as recommended by your health care provider. What happens during an annual well check? The services and screenings done by your health care provider during your annual well check will depend on your age, overall health,  lifestyle risk factors, and family history of disease. Counseling  Your health care provider may ask you questions about your: Alcohol use. Tobacco use. Drug use. Emotional well-being. Home and relationship well-being. Sexual activity. Eating habits. History of falls. Memory and ability to understand (cognition). Work and work Astronomer. Reproductive health. Screening  You may have the following tests or measurements: Height, weight, and BMI. Blood pressure. Lipid and cholesterol levels. These may be checked every 5 years, or more frequently if you are over 4 years old. Skin check. Lung cancer screening. You may have this screening every year starting at age 36 if you have a 30-pack-year history of smoking and currently smoke or have quit within the past 15 years. Fecal occult blood test (FOBT) of the stool. You may have this test every year starting at age 17. Flexible sigmoidoscopy or colonoscopy. You may have a sigmoidoscopy every 5 years or a colonoscopy every 10 years starting at age 51. Hepatitis C blood test. Hepatitis B blood test. Sexually transmitted disease (STD) testing. Diabetes screening. This is done by checking your blood sugar (glucose) after you have not eaten for a while (fasting). You may have this done every 1-3 years. Bone density scan. This is done to screen for osteoporosis. You may have this done starting at age 17. Mammogram. This may be done every 1-2 years. Talk to your health care provider about how often you should have regular mammograms. Talk with your health care provider about your test results, treatment options, and if necessary, the need for more tests. Vaccines  Your  health care provider may recommend certain vaccines, such as: Influenza vaccine. This is recommended every year. Tetanus, diphtheria, and acellular pertussis (Tdap, Td) vaccine. You may need a Td booster every 10 years. Zoster vaccine. You may need this after age  19. Pneumococcal 13-valent conjugate (PCV13) vaccine. One dose is recommended after age 63. Pneumococcal polysaccharide (PPSV23) vaccine. One dose is recommended after age 65. Talk to your health care provider about which screenings and vaccines you need and how often you need them. This information is not intended to replace advice given to you by your health care provider. Make sure you discuss any questions you have with your health care provider. Document Released: 12/22/2015 Document Revised: 08/14/2016 Document Reviewed: 09/26/2015 Elsevier Interactive Patient Education  2017 [employer].  Fall Prevention in the Home Falls can cause injuries. They can happen to people of all ages. There are many things you can do to make your home safe and to help prevent falls. What can I do on the outside of my home? Regularly fix the edges of walkways and driveways and fix any cracks. Remove anything that might make you trip as you walk through a door, such as a raised step or threshold. Trim any bushes or trees on the path to your home. Use bright outdoor lighting. Clear any walking paths of anything that might make someone trip, such as rocks or tools. Regularly check to see if handrails are loose or broken. Make sure that both sides of any steps have handrails. Any raised decks and porches should have guardrails on the edges. Have any leaves, snow, or ice cleared regularly. Use sand or salt on walking paths during winter. Clean up any spills in your garage right away. This includes oil or grease spills. What can I do in the bathroom? Use night lights. Install grab bars by the toilet and in the tub and shower. Do not use towel bars as grab bars. Use non-skid mats or decals in the tub or shower. If you need to sit down in the shower, use a plastic, non-slip stool. Keep the floor dry. Clean up any water that spills on the floor as soon as it happens. Remove soap buildup in the tub or shower  regularly. Attach bath mats securely with double-sided non-slip rug tape. Do not have throw rugs and other things on the floor that can make you trip. What can I do in the bedroom? Use night lights. Make sure that you have a light by your bed that is easy to reach. Do not use any sheets or blankets that are too big for your bed. They should not hang down onto the floor. Have a firm chair that has side arms. You can use this for support while you get dressed. Do not have throw rugs and other things on the floor that can make you trip. What can I do in the kitchen? Clean up any spills right away. Avoid walking on wet floors. Keep items that you use a lot in easy-to-reach places. If you need to reach something above you, use a strong step stool that has a grab bar. Keep electrical cords out of the way. Do not use floor polish or wax that makes floors slippery. If you must use wax, use non-skid floor wax. Do not have throw rugs and other things on the floor that can make you trip. What can I do with my stairs? Do not leave any items on the stairs. Make sure that there are handrails  on both sides of the stairs and use them. Fix handrails that are broken or loose. Make sure that handrails are as long as the stairways. Check any carpeting to make sure that it is firmly attached to the stairs. Fix any carpet that is loose or worn. Avoid having throw rugs at the top or bottom of the stairs. If you do have throw rugs, attach them to the floor with carpet tape. Make sure that you have a light switch at the top of the stairs and the bottom of the stairs. If you do not have them, ask someone to add them for you. What else can I do to help prevent falls? Wear shoes that: Do not have high heels. Have rubber bottoms. Are comfortable and fit you well. Are closed at the toe. Do not wear sandals. If you use a stepladder: Make sure that it is fully opened. Do not climb a closed stepladder. Make sure that  both sides of the stepladder are locked into place. Ask someone to hold it for you, if possible. Clearly mark and make sure that you can see: Any grab bars or handrails. First and last steps. Where the edge of each step is. Use tools that help you move around (mobility aids) if they are needed. These include: Canes. Walkers. Scooters. Crutches. Turn on the lights when you go into a dark area. Replace any light bulbs as soon as they burn out. Set up your furniture so you have a clear path. Avoid moving your furniture around. If any of your floors are uneven, fix them. If there are any pets around you, be aware of where they are. Review your medicines with your doctor. Some medicines can make you feel dizzy. This can increase your chance of falling. Ask your doctor what other things that you can do to help prevent falls. This information is not intended to replace advice given to you by your health care provider. Make sure you discuss any questions you have with your health care provider. Document Released: 09/21/2009 Document Revised: 05/02/2016 Document Reviewed: 12/30/2014 Elsevier Interactive Patient Education  2017 [employer].

## 2023-07-08 NOTE — Progress Notes (Signed)
Subjective:   [name] is a 89 y.o. female who presents for Medicare Annual (Subsequent) preventive examination.  Visit Complete: Virtual  I connected with  [name] on 07/08/23 by a audio enabled telemedicine application and verified that I am speaking with the correct person using two identifiers.  Patient Location: Home  Provider Location: Office/Clinic  I discussed the limitations of evaluation and management by telemedicine. The patient expressed understanding and agreed to proceed.  Vital Signs: Unable to obtain new vitals due to this being a telehealth visit.  Patient Medicare AWV questionnaire was completed by the patient on (not done) ; I have confirmed that all information answered by patient is correct and no changes since this date.  Review of Systems    Cardiac Risk Factors include: advanced age (>54men, >27 women);hypertension;sedentary lifestyle    Objective:    Today's Vitals   07/08/23 1543  Weight: 113 lb (51.3 kg)  Height: 4\' 11"  (1.499 m)  PainSc: 0-No pain   Body mass index is 22.82 kg/m.     07/08/2023    3:58 PM 07/04/2022    3:31 PM  Advanced Directives  Does Patient Have a Medical Advance Directive? Yes Yes  Type of Estate agent of [place];Living will Healthcare Power of [place];Living will  Does patient want to make changes to medical advance directive?  No - Patient declined  Copy of Healthcare Power of Attorney in Chart?  Yes - validated most recent copy scanned in chart (See row information)    Current Medications (verified) Outpatient Encounter Medications as of 07/08/2023  Medication Sig   amLODipine (NORVASC) 5 MG tablet Take 1 tablet (5 mg total) by mouth daily.   atorvastatin (LIPITOR) 10 MG tablet TAKE 1 TABLET (10 MG TOTAL) BY MOUTH DAILY. PLEASE SCHEDULE OFFICE VISIT BEFORE ANY FUTURE REFILL   calcium carbonate (OS-CAL - DOSED IN MG OF ELEMENTAL CALCIUM) 1250 (500 Ca) MG tablet Take 1 tablet by mouth 2  (two) times daily with a meal.   dipyridamole-aspirin (AGGRENOX) 200-25 MG 12hr capsule TAKE 1 CAPSULE BY MOUTH TWICE A DAY   empagliflozin (JARDIANCE) 10 MG TABS tablet Take 1 tablet (10 mg total) by mouth daily.   loratadine (CLARITIN) 10 MG tablet Take 10 mg by mouth daily.   losartan (COZAAR) 50 MG tablet TAKE 1 TABLET BY MOUTH TWICE A DAY   metolazone (ZAROXOLYN) 2.5 MG tablet Take 1 tablet (2.5 mg total) by mouth 3 (three) times a week. Take 1 tablet (2.5 mg) by mouth 2 times a week Monday and Thursday   metoprolol (TOPROL-XL) 200 MG 24 hr tablet TAKE 1 TABLET BY MOUTH EVERY DAY   Multiple Vitamins-Minerals (PRESERVISION AREDS 2 PO) Take 1 tablet by mouth in the morning and at bedtime.   multivitamin-iron-minerals-folic acid (CENTRUM) chewable tablet Chew 1 tablet by mouth daily.   Omega-3 Fatty Acids (FISH OIL) 1000 MG CAPS Take 1 capsule by mouth daily.   potassium chloride (KLOR-CON) 10 MEQ tablet Take 10 mEq by mouth as needed. Only if she takes an additional dose of lasix   torsemide (DEMADEX) 20 MG tablet TAKE 2 TABLETS (40 MG TOTAL) BY MOUTH DAILY. AND ADDITIONAL 20MG  IF NEEDED (Patient taking differently: Take 20 mg by mouth daily. With additional 20 mg if needed)   No facility-administered encounter medications on file as of 07/08/2023.    Allergies (verified) Moxifloxacin, Tobramycin, and Codeine   History: Past Medical History:  Diagnosis Date   Arrhythmia    atrial  fibrillation   Congestive heart failure (CHF) (HCC)    Hypertension    Pacemaker 2015   Past Surgical History:  Procedure Laterality Date   ABDOMINAL HYSTERECTOMY     EYE SURGERY     MOHS SURGERY  2023   PACEMAKER PLACEMENT N/A 2007   2017   Family History  Problem Relation Age of Onset   Heart failure Mother    Heart attack Father    Heart attack Daughter    Social History   Socioeconomic History   Marital status: Widowed    Spouse name: Not on file   Number of children: Not on file   Years  of education: Not on file   Highest education level: Not on file  Occupational History   Not on file  Tobacco Use   Smoking status: Never   Smokeless tobacco: Never  Substance and Sexual Activity   Alcohol use: Not Currently   Drug use: Never   Sexual activity: Not on file  Other Topics Concern   Not on file  Social History Narrative   Not on file   Social Determinants of Health   Financial Resource Strain: Low Risk  (07/08/2023)   Overall Financial Resource Strain (CARDIA)    Difficulty of Paying Living Expenses: Not hard at all  Food Insecurity: No Food Insecurity (07/08/2023)   Hunger Vital Sign    Worried About Running Out of Food in the Last Year: Never true    Ran Out of Food in the Last Year: Never true  Transportation Needs: No Transportation Needs (07/08/2023)   PRAPARE - Administrator, Civil Service (Medical): No    Lack of Transportation (Non-Medical): No  Physical Activity: Inactive (07/08/2023)   Exercise Vital Sign    Days of Exercise per Week: 0 days    Minutes of Exercise per Session: 0 min  Stress: No Stress Concern Present (07/08/2023)   [employer] of Occupational Health - Occupational Stress Questionnaire    Feeling of Stress : Not at all  Social Connections: Moderately Isolated (07/08/2023)   Social Connection and Isolation Panel [NHANES]    Frequency of Communication with Friends and Family: More than three times a week    Frequency of Social Gatherings with Friends and Family: More than three times a week    Attends Religious Services: More than 4 times per year    Active Member of [employer] or Organizations: No    Attends Banker Meetings: Never    Marital Status: Widowed    Tobacco Counseling Counseling given: Not Answered   Clinical Intake:  Pre-visit preparation completed: Yes  Pain : No/denies pain Pain Score: 0-No pain     BMI - recorded: 22.82 Nutritional Status: BMI of 19-24  Normal Nutritional Risks:  None Diabetes: No  How often do you need to have someone help you when you read instructions, pamphlets, or other written materials from your doctor or pharmacy?: 1 - Never  Interpreter Needed?: No  Comments: lives with her daughter Information entered by :: B.Deeya Richeson,LPN   Activities of Daily Living    07/08/2023    3:58 PM 05/16/2023    1:52 PM  In your present state of health, do you have any difficulty performing the following activities:  Hearing? 1 1  Vision? 1 1  Difficulty concentrating or making decisions? 0 0  Walking or climbing stairs? 1 1  Dressing or bathing? 0 0  Doing errands, shopping? 1 1  Preparing Food and  eating ? N   Using the Toilet? N   In the past six months, have you accidently leaked urine? Y   Do you have problems with loss of bowel control? N   Managing your Medications? N   Managing your Finances? Y   Housekeeping or managing your Housekeeping? Y     Patient Care Team: [name], FNP as PCP - General (Family Medicine) Lanier Prude, MD as PCP - Electrophysiology (Cardiology) Debbe Odea, MD as PCP - Cardiology (Cardiology)  Indicate any recent Medical Services you may have received from other than [facility] providers in the past year (date may be approximate).     Assessment:   This is a routine wellness examination for Dezi.  Hearing/Vision screen Hearing Screening - Comments:: Inadequate hearing;good hearing aides Vision Screening - Comments:: Inadequate vision;almost blind macular degeneration  Dietary issues and exercise activities discussed:     Goals Addressed             This Visit's Progress    DIET - EAT MORE FRUITS AND VEGETABLES   On track      Depression Screen    07/08/2023    3:54 PM 05/16/2023    1:52 PM 04/17/2023    3:57 PM 09/26/2022    2:51 PM 07/04/2022    3:28 PM 03/27/2022    2:25 PM 02/05/2022    9:26 AM  PHQ 2/9 Scores  PHQ - 2 Score 0 0 0 0 0 0 0  PHQ- 9 Score  0 0 0 0 2     Fall  Risk    07/08/2023    3:46 PM 05/16/2023    1:51 PM 04/17/2023    3:57 PM 09/26/2022    2:51 PM 07/04/2022    3:32 PM  Fall Risk   Falls in the past year? 1 0 1 1 1   Number falls in past yr: 1 0 1 1 1   Injury with Fall? 1 0 1 0 0  Comment wound to left leg      Risk for fall due to : No Fall Risks  History of fall(s)  Impaired vision;History of fall(s)  Follow up Falls prevention discussed;Education provided  Falls evaluation completed  Falls evaluation completed    MEDICARE RISK AT HOME:  Medicare Risk at Home - 07/08/23 1548     Any stairs in or around the home? Yes    If so, are there any without handrails? No    Home free of loose throw rugs in walkways, pet beds, electrical cords, etc? Yes    Adequate lighting in your home to reduce risk of falls? Yes    Life alert? No    Use of a cane, walker or w/c? Yes   walker   Grab bars in the bathroom? Yes    Shower chair or bench in shower? Yes    Elevated toilet seat or a handicapped toilet? No             TIMED UP AND GO:  Was the test performed?  No    Cognitive Function:        07/08/2023    4:00 PM  6CIT Screen  What Year? 0 points  What month? 0 points  What time? 0 points  Count back from 20 0 points  Months in reverse 0 points  Repeat phrase 0 points  Total Score 0 points    Immunizations There is no immunization history for the selected administration types on  file for this patient.  TDAP status: Up to date  Flu Vaccine status: Up to date  Pneumococcal vaccine status: Up to date  Covid-19 vaccine status: Completed vaccines  Qualifies for Shingles Vaccine? Yes   Zostavax completed No   Shingrix Completed?: No.    Education has been provided regarding the importance of this vaccine. Patient has been advised to call insurance company to determine out of pocket expense if they have not yet received this vaccine. Advised may also receive vaccine at local pharmacy or Health Dept. Verbalized acceptance and  understanding.  Screening Tests Health Maintenance  Topic Date Due   COVID-19 Vaccine (1) Never done   DTaP/Tdap/Td (1 - Tdap) Never done   Zoster Vaccines- Shingrix (1 of 2) Never done   DEXA SCAN  Never done   Pneumonia Vaccine 104+ Years old (1 of 1 - PCV) 09/27/2023 (Originally 07/16/1990)   INFLUENZA VACCINE  07/10/2023   Medicare Annual Wellness (AWV)  07/07/2024   HPV VACCINES  Aged Out    Health Maintenance  Health Maintenance Due  Topic Date Due   COVID-19 Vaccine (1) Never done   DTaP/Tdap/Td (1 - Tdap) Never done   Zoster Vaccines- Shingrix (1 of 2) Never done   DEXA SCAN  Never done    Colorectal cancer screening: No longer required.   Mammogram status: No longer required due to age.  Lung Cancer Screening: (Low Dose CT Chest recommended if Age 54-80 years, 20 pack-year currently smoking OR have quit w/in 15years.) does not qualify.   Lung Cancer Screening Referral: yes  Additional Screening:  Hepatitis C Screening: does not qualify; Completed yes  Vision Screening: Recommended annual ophthalmology exams for early detection of glaucoma and other disorders of the eye. Is the patient up to date with their annual eye exam?  Yes  Who is the provider or what is the name of the office in which the patient attends annual eye exams? Dr Kelly Splinter pt no longer sees If pt is not established with a provider, would they like to be referred to a provider to establish care? No .   Dental Screening: Recommended annual dental exams for proper oral hygiene  Diabetic Foot Exam: n/a  Community Resource Referral / Chronic Care Management: CRR required this visit?  No   CCM required this visit?  No    Plan:     I have personally reviewed and noted the following in the patient's chart:   Medical and social history Use of alcohol, tobacco or illicit drugs  Current medications and supplements including opioid prescriptions. Patient is not currently taking opioid  prescriptions. Functional ability and status Nutritional status Physical activity Advanced directives List of other physicians Hospitalizations, surgeries, and ER visits in previous 12 months Vitals Screenings to include cognitive, depression, and falls Referrals and appointments  In addition, I have reviewed and discussed with patient certain preventive protocols, quality metrics, and best practice recommendations. A written personalized care plan for preventive services as well as general preventive health recommendations were provided to patient.     Sue Lush, LPN   2/72/5366   After Visit Summary: (MyChart) Due to this being a telephonic visit, the after visit summary with patients personalized plan was offered to patient via MyChart   Nurse Notes: The patient states she is doing well and has no concerns or questions at this time.

## 2023-07-19 IMAGING — DX DG TIBIA/FIBULA 2V*L*
5 series · 5 of 5 positions shown · non-contrast
Comparison: None.

CLINICAL DATA: Trauma, skin laceration

EXAM:
LEFT TIBIA AND FIBULA - 2 VIEW

[tibia ap (1 of 2)]
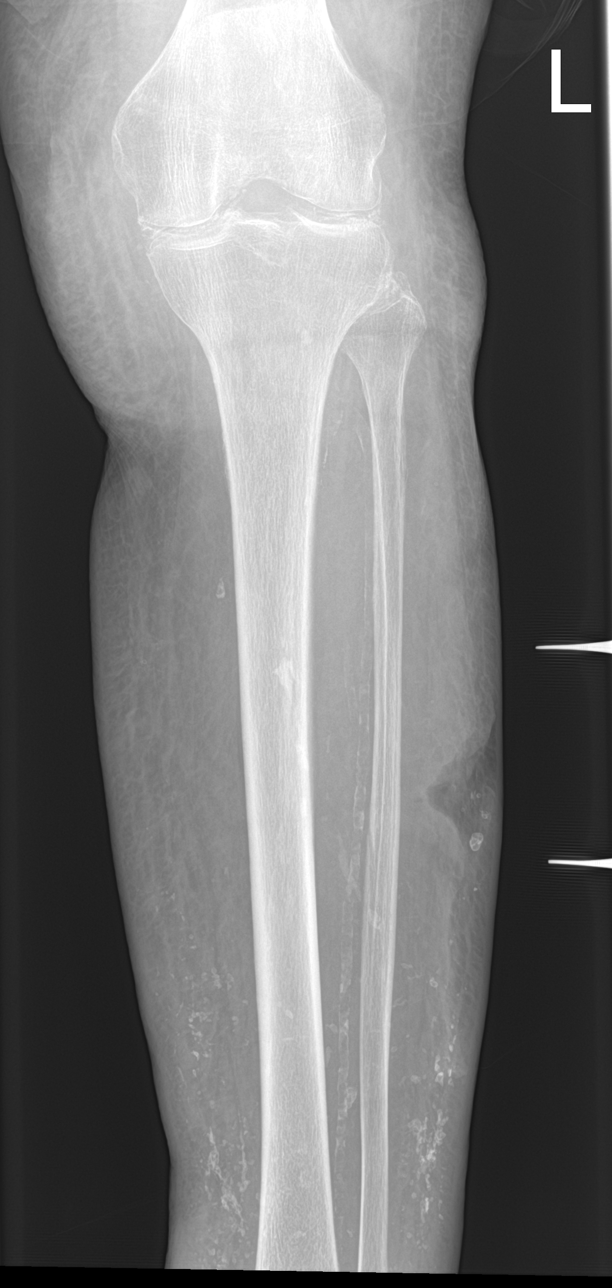

[tibia ap (2 of 2)]
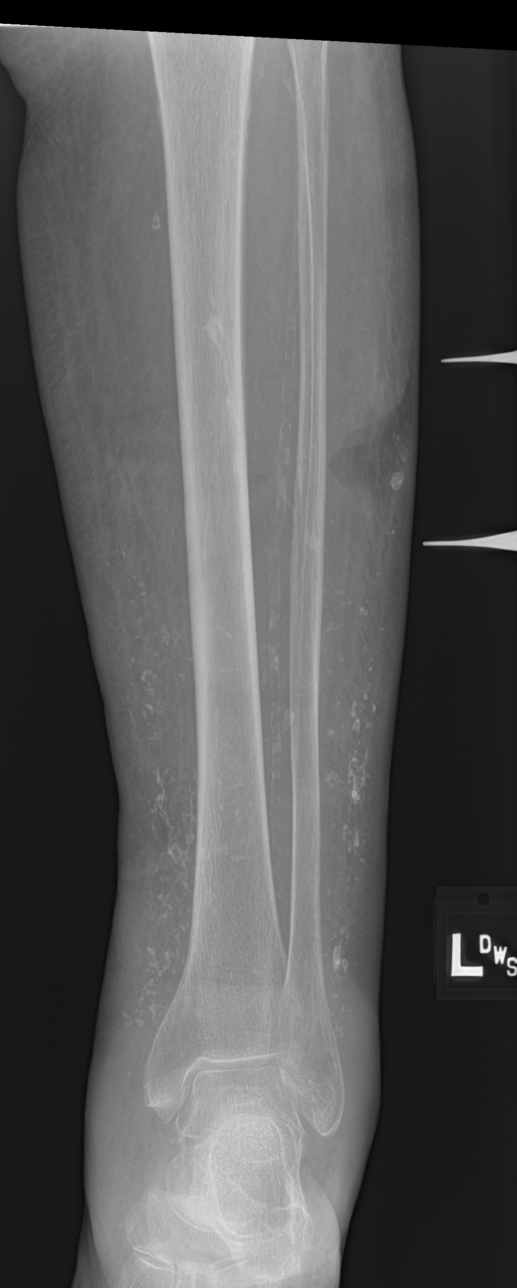

[tibia lat (1 of 3)]
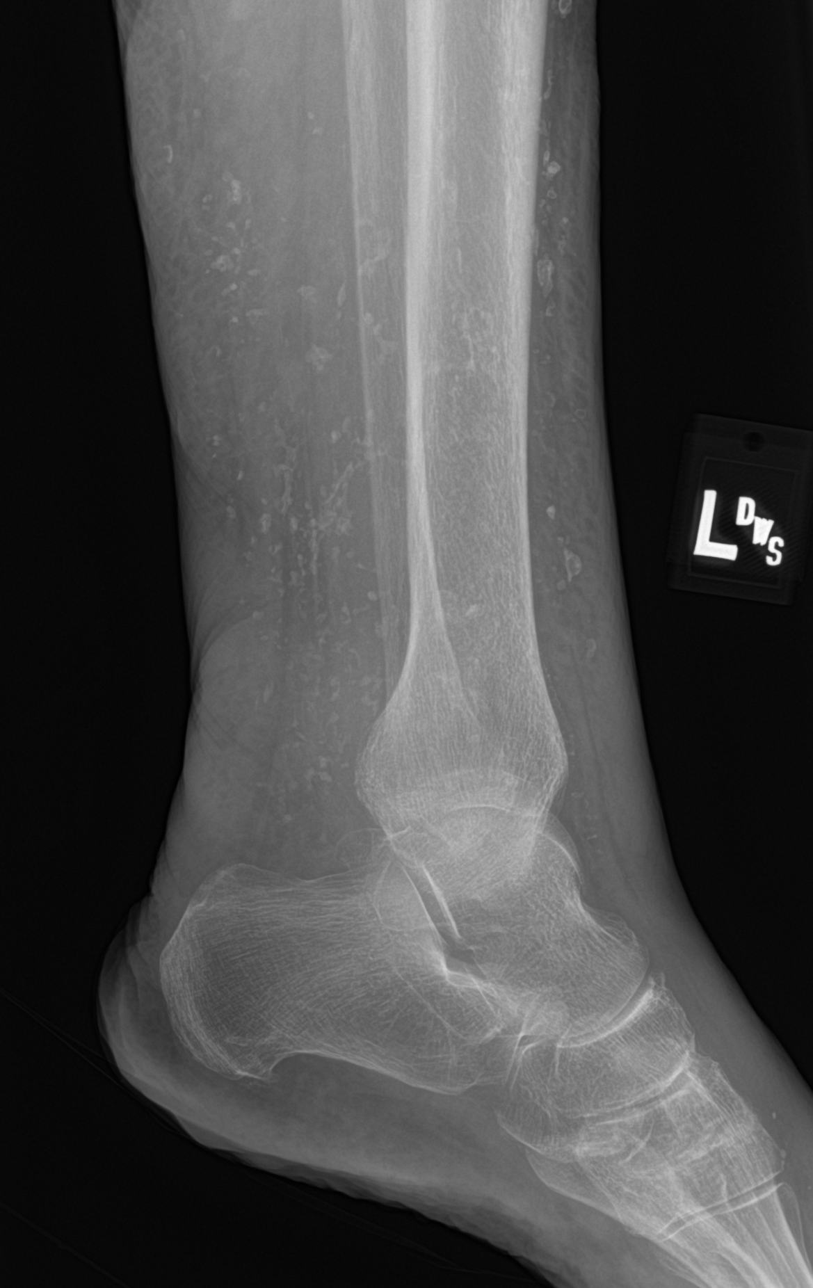

[tibia lat (2 of 3)]
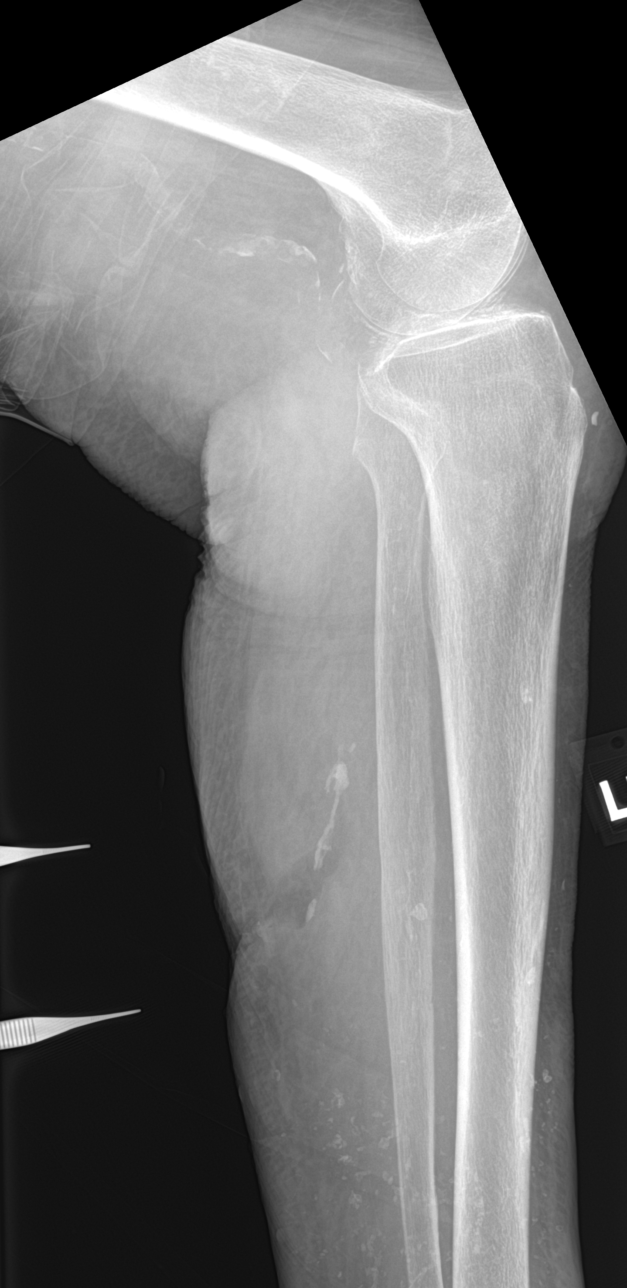

[tibia lat (3 of 3)]
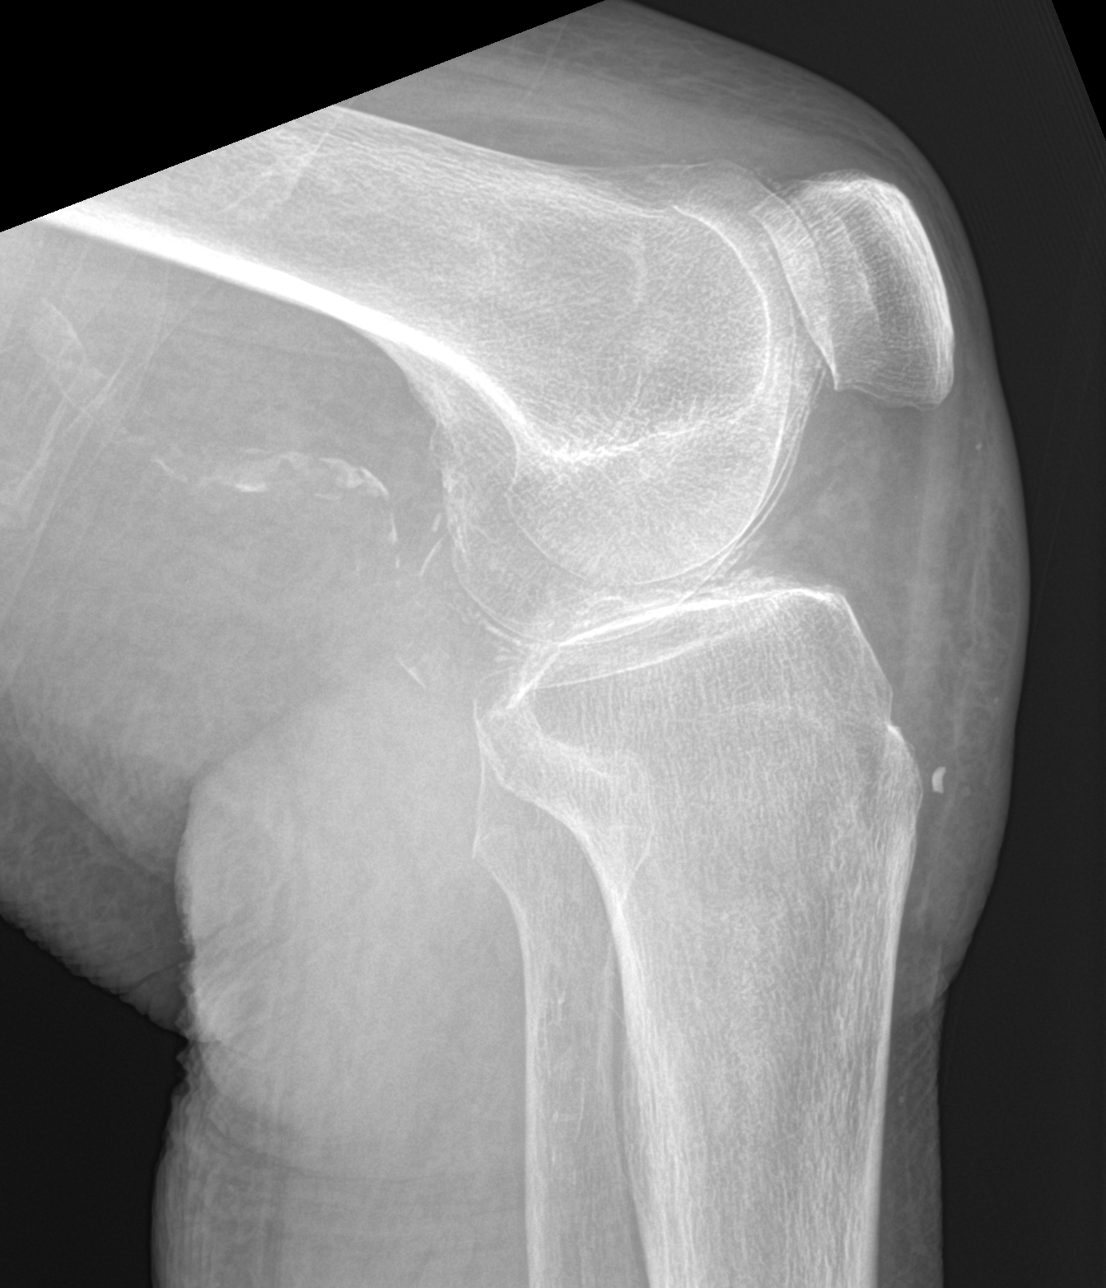

[5 of 5 positions shown; findings below may reference images not displayed]

FINDINGS: No fracture or dislocation is seen. There are no focal lytic
lesions. There is air density in the lateral aspect of mid lower leg
suggesting possible open wound in the skin. Arterial calcifications
are seen. There are scattered soft tissue calcifications in the soft
tissues, possibly due to chronic inflammation. There is small smooth
marginated calcific density anterior to the tibial tuberosity in the
proximal tibia. Degenerative changes are noted in the left knee with
small bony spurs and chondrocalcinosis. There is diffuse edema in
the subcutaneous plane in the left lower leg.
IMPRESSION: No fracture or dislocation is seen. There is possible open wound in
the skin in the lateral aspect of mid left lower leg. There are no
focal lytic lesions. There are no opaque foreign bodies.

## 2023-08-03 ENCOUNTER — Other Ambulatory Visit: Payer: Self-pay | Admitting: Family Medicine

## 2023-08-05 ENCOUNTER — Ambulatory Visit: Payer: Medicare PPO | Admitting: Medical

## 2023-08-05 NOTE — Progress Notes (Deleted)
Cardiology Office Note:    Date:  08/05/2023   ID:  [name], DOB [birth year], MRN [id number]  PCP:  [name], FNP  [facility] Cardiologist:  Debbe Odea, MD  [facility] Electrophysiologist:  Lanier Prude, MD   Referring MD: [name], FNP   Chief Complaint: ***  History of Present Illness:    [name] is a 89 y.o. 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    Pacemaker implanted in 2007 with generator changeout in 2015. She is not anticoagulated given history of macular degeneration and falls. The patient follows with EP.    H/o CHF. Echo in 2020 showed normal LVEF, indeterminate diastolic dysfunction.   Patient was seen 05/27/23 and was volume overloaded despite increasing Torsemide. Metolazone 2.5mg  x2 days was added. Symptoms improved, but then worsened after stopping metolazone. It was then recommended she take metolazone 2.42m three times weekly. She was also referred to the [facility] team.  Today May need to switch amlodipine to spiro  Past Medical History:  Diagnosis Date   Arrhythmia    atrial fibrillation   Congestive heart failure (CHF) (HCC)    Hypertension    Pacemaker 2015    Past Surgical History:  Procedure Laterality Date   ABDOMINAL HYSTERECTOMY     EYE SURGERY     MOHS SURGERY  2023   PACEMAKER PLACEMENT N/A 2007   2017    Current Medications: No outpatient medications have been marked as taking for the 08/05/23 encounter (Appointment) with Fransico Michael, Stormie Ventola H, PA-C.     Allergies:   Moxifloxacin, Tobramycin, and Codeine   Social History   Socioeconomic History   Marital status: Widowed    Spouse name: Not on file   Number of children: Not on file   Years of education: Not on file   Highest education level: Not on file  Occupational History   Not on file  Tobacco Use   Smoking status: Never   Smokeless tobacco: Never  Substance and  Sexual Activity   Alcohol use: Not Currently   Drug use: Never   Sexual activity: Not on file  Other Topics Concern   Not on file  Social History Narrative   Not on file   Social Determinants of Health   Financial Resource Strain: Low Risk  (07/08/2023)   Overall Financial Resource Strain (CARDIA)    Difficulty of Paying Living Expenses: Not hard at all  Food Insecurity: No Food Insecurity (07/08/2023)   Hunger Vital Sign    Worried About Running Out of Food in the Last Year: Never true    Ran Out of Food in the Last Year: Never true  Transportation Needs: No Transportation Needs (07/08/2023)   PRAPARE - Administrator, Civil Service (Medical): No    Lack of Transportation (Non-Medical): No  Physical Activity: Inactive (07/08/2023)   Exercise Vital Sign    Days of Exercise per Week: 0 days    Minutes of Exercise per Session: 0 min  Stress: No Stress Concern Present (07/08/2023)   [employer] of Occupational Health - Occupational Stress Questionnaire    Feeling of Stress : Not at all  Social Connections: Moderately Isolated (07/08/2023)   Social Connection and Isolation Panel [NHANES]    Frequency of Communication with Friends and Family: More than three times a week    Frequency of Social Gatherings with Friends and Family: More than  three times a week    Attends Religious Services: More than 4 times per year    Active Member of Clubs or Organizations: No    Attends Banker Meetings: Never    Marital Status: Widowed     Family History: The patient's ***family history includes Heart attack in her daughter and father; Heart failure in her mother.  ROS:   Please see the history of present illness.    *** All other systems reviewed and are negative.  EKGs/Labs/Other Studies Reviewed:    The following studies were reviewed today: ***  EKG:  EKG is *** ordered today.  The ekg ordered today demonstrates ***  Recent Labs: 05/27/2023: ALT 20; B  Natriuretic Peptide 603.4; Hemoglobin 11.8; Platelets 243 06/23/2023: BUN 97; Creatinine, Ser 1.36; Potassium 3.4; Sodium 127  Recent Lipid Panel No results found for: "CHOL", "TRIG", "HDL", "CHOLHDL", "VLDL", "LDLCALC", "LDLDIRECT"   Risk Assessment/Calculations:   {Does this patient have ATRIAL FIBRILLATION?:[phone]}   Physical Exam:    VS:  LMP  (LMP Unknown)     Wt Readings from Last 3 Encounters:  07/08/23 113 lb (51.3 kg)  07/01/23 113 lb (51.3 kg)  06/11/23 119 lb 12.8 oz (54.3 kg)     GEN: *** Well nourished, well developed in no acute distress HEENT: Normal NECK: No JVD; No carotid bruits LYMPHATICS: No lymphadenopathy CARDIAC: ***RRR, no murmurs, rubs, gallops RESPIRATORY:  Clear to auscultation without rales, wheezing or rhonchi  ABDOMEN: Soft, non-tender, non-distended MUSCULOSKELETAL:  No edema; No deformity  SKIN: Warm and dry NEUROLOGIC:  Alert and oriented x 3 PSYCHIATRIC:  Normal affect   ASSESSMENT:    No diagnosis found. PLAN:    In order of problems listed above:  ***  Disposition: Follow up {follow up:15908} with ***   Shared Decision Making/Informed Consent   {Are you ordering a CV Procedure (e.g. stress test, cath, DCCV, TEE, etc)?   Press F2        :[id number]}    Signed, Caydyn Sprung David Stall, PA-C  08/05/2023 12:50 PM    [facility]

## 2023-08-24 ENCOUNTER — Other Ambulatory Visit: Payer: Self-pay | Admitting: Family Medicine

## 2023-08-27 ENCOUNTER — Ambulatory Visit (INDEPENDENT_AMBULATORY_CARE_PROVIDER_SITE_OTHER): Payer: Medicare PPO

## 2023-08-27 DIAGNOSIS — I5043 Acute on chronic combined systolic (congestive) and diastolic (congestive) heart failure: Secondary | ICD-10-CM | POA: Diagnosis not present

## 2023-08-29 ENCOUNTER — Other Ambulatory Visit: Payer: Self-pay | Admitting: Family

## 2023-09-02 ENCOUNTER — Ambulatory Visit: Payer: Medicare PPO

## 2023-09-02 DIAGNOSIS — I4821 Permanent atrial fibrillation: Secondary | ICD-10-CM | POA: Diagnosis not present

## 2023-09-02 NOTE — Progress Notes (Signed)
Remote pacemaker transmission.   

## 2023-09-03 LAB — CUP PACEART REMOTE DEVICE CHECK
Battery Impedance: 2698 Ohm
Battery Remaining Longevity: 28 mo
Battery Voltage: 2.75 V
Brady Statistic RV Percent Paced: 94 %
Date Time Interrogation Session: 20240924201925
Implantable Lead Connection Status: 753985
Implantable Lead Connection Status: 753985
Implantable Lead Implant Date: 20070523
Implantable Lead Implant Date: 20070523
Implantable Lead Location: 753862
Implantable Lead Location: 753862
Implantable Lead Model: 4092
Implantable Lead Model: 5076
Implantable Pulse Generator Implant Date: 20151028
Lead Channel Impedance Value: 664 Ohm
Lead Channel Impedance Value: 67 Ohm
Lead Channel Pacing Threshold Amplitude: 0.5 V
Lead Channel Pacing Threshold Pulse Width: 0.4 ms
Lead Channel Setting Pacing Amplitude: 2 V
Lead Channel Setting Pacing Pulse Width: 0.4 ms
Lead Channel Setting Sensing Sensitivity: 4 mV
Zone Setting Status: 755011
Zone Setting Status: 755011

## 2023-09-04 ENCOUNTER — Other Ambulatory Visit: Payer: Self-pay | Admitting: Family Medicine

## 2023-09-04 ENCOUNTER — Other Ambulatory Visit: Payer: Self-pay | Admitting: Family

## 2023-09-05 NOTE — Telephone Encounter (Signed)
Requested medication (s) are due for refill today: yes  Requested medication (s) are on the active medication list: yes  Last refill:  729/24 and 06/09/23  Future visit scheduled: no  Notes to clinic:  Unable to refill per protocol due to failed labs, no updated lipid results.      Requested Prescriptions  Pending Prescriptions Disp Refills   atorvastatin (LIPITOR) 10 MG tablet [Pharmacy Med Name: ATORVASTATIN 10 MG TABLET] 90 tablet 0    Sig: TAKE 1 TABLET (10 MG TOTAL) BY MOUTH DAILY. PLEASE SCHEDULE OFFICE VISIT BEFORE ANY FUTURE REFILL     Cardiovascular:  Antilipid - Statins Failed - 09/04/2023  3:27 PM      Failed - Lipid Panel in normal range within the last 12 months    No results found for: "CHOL", "POCCHOL", "CHOLTOT" No results found for: "LDLCALC", "LDLC", "HIRISKLDL", "POCLDL", "LDLDIRECT", "REALLDLC", "TOTLDLC" No results found for: "HDL", "POCHDL" No results found for: "TRIG", "POCTRIG"       Passed - Patient is not pregnant      Passed - Valid encounter within last 12 months    Recent Outpatient Visits           3 months ago Viral URI with cough   [facility] Merita Norton T, FNP   4 months ago Skin tear of left lower leg without complication, sequela   [facility] [name], FNP   11 months ago Annual physical exam   [facility] Merita Norton T, FNP   1 year ago Cough, unspecified type   [facility] Malva Limes, MD   1 year ago Irritability   [facility] [place], [place], PA-C               metoprolol (TOPROL-XL) 200 MG 24 hr tablet [Pharmacy Med Name: METOPROLOL SUCC ER 200 MG TAB] 90 tablet 0    Sig: TAKE 1 TABLET BY MOUTH EVERY DAY     Cardiovascular:  Beta Blockers Passed - 09/04/2023  3:27 PM      Passed - Last BP in normal range    BP Readings from Last 1 Encounters:  07/01/23 116/64         Passed -  Last Heart Rate in normal range    Pulse Readings from Last 1 Encounters:  07/01/23 79         Passed - Valid encounter within last 6 months    Recent Outpatient Visits           3 months ago Viral URI with cough   [facility] Merita Norton T, FNP   4 months ago Skin tear of left lower leg without complication, sequela   [facility] [name], FNP   11 months ago Annual physical exam   [facility] Merita Norton T, FNP   1 year ago Cough, unspecified type   [facility] Malva Limes, MD   1 year ago Irritability   [facility] [place], [place], [place]

## 2023-09-10 ENCOUNTER — Telehealth: Payer: Self-pay | Admitting: Family

## 2023-09-10 ENCOUNTER — Other Ambulatory Visit: Payer: Self-pay | Admitting: Family

## 2023-09-10 ENCOUNTER — Encounter: Payer: Self-pay | Admitting: Family

## 2023-09-10 ENCOUNTER — Ambulatory Visit: Payer: Medicare PPO | Admitting: Family

## 2023-09-10 ENCOUNTER — Telehealth: Payer: Self-pay | Admitting: Cardiology

## 2023-09-10 ENCOUNTER — Ambulatory Visit
Admission: RE | Admit: 2023-09-10 | Discharge: 2023-09-10 | Disposition: A | Discharge status: Home / Self Care | Payer: Medicare PPO | Source: Ambulatory Visit | Attending: Family | Admitting: Family

## 2023-09-10 VITALS — BP 116/62 | HR 53 | Wt 122.5 lb

## 2023-09-10 DIAGNOSIS — I5032 Chronic diastolic (congestive) heart failure: Secondary | ICD-10-CM | POA: Insufficient documentation

## 2023-09-10 DIAGNOSIS — I4891 Unspecified atrial fibrillation: Secondary | ICD-10-CM | POA: Insufficient documentation

## 2023-09-10 DIAGNOSIS — I4821 Permanent atrial fibrillation: Secondary | ICD-10-CM | POA: Diagnosis not present

## 2023-09-10 DIAGNOSIS — I890 Lymphedema, not elsewhere classified: Secondary | ICD-10-CM | POA: Diagnosis not present

## 2023-09-10 DIAGNOSIS — I110 Hypertensive heart disease with heart failure: Secondary | ICD-10-CM | POA: Diagnosis not present

## 2023-09-10 DIAGNOSIS — Z950 Presence of cardiac pacemaker: Secondary | ICD-10-CM | POA: Diagnosis not present

## 2023-09-10 DIAGNOSIS — I5030 Unspecified diastolic (congestive) heart failure: Secondary | ICD-10-CM

## 2023-09-10 DIAGNOSIS — I509 Heart failure, unspecified: Secondary | ICD-10-CM | POA: Diagnosis present

## 2023-09-10 LAB — BASIC METABOLIC PANEL
Anion gap: 8 (ref 5–15)
BUN: 39 mg/dL — ABNORMAL HIGH (ref 8–23)
CO2: 24 mmol/L (ref 22–32)
Calcium: 8.8 mg/dL — ABNORMAL LOW (ref 8.9–10.3)
Chloride: 93 mmol/L — ABNORMAL LOW (ref 98–111)
Creatinine, Ser: 0.99 mg/dL (ref 0.44–1.00)
GFR, Estimated: 52 mL/min — ABNORMAL LOW (ref >60)
Glucose, Bld: 137 mg/dL — ABNORMAL HIGH (ref 70–99)
Potassium: 5.7 mmol/L — ABNORMAL HIGH (ref 3.5–5.1)
Sodium: 125 mmol/L — ABNORMAL LOW (ref 135–145)

## 2023-09-10 LAB — BRAIN NATRIURETIC PEPTIDE: B Natriuretic Peptide: 816.8 pg/mL — ABNORMAL HIGH (ref 0.0–100.0)

## 2023-09-10 MED ORDER — POTASSIUM CHLORIDE CRYS ER 20 MEQ PO TBCR
EXTENDED_RELEASE_TABLET | ORAL | Status: AC
  Filled 2023-09-10: qty 2

## 2023-09-10 MED ORDER — POTASSIUM CHLORIDE CRYS ER 20 MEQ PO TBCR
40.0000 meq | EXTENDED_RELEASE_TABLET | Freq: Once | ORAL | Status: AC
  Administered 2023-09-10: 40 meq via ORAL

## 2023-09-10 MED ORDER — FUROSEMIDE 10 MG/ML IJ SOLN
INTRAMUSCULAR | Status: AC
  Filled 2023-09-10: qty 8

## 2023-09-10 MED ORDER — FUROSEMIDE 10 MG/ML IJ SOLN
80.0000 mg | Freq: Once | INTRAMUSCULAR | Status: AC
  Administered 2023-09-10: 80 mg via INTRAVENOUS

## 2023-09-10 NOTE — Telephone Encounter (Signed)
Pt c/o swelling: STAT is pt has developed SOB within 24 hours  How much weight have you gained and in what time span? 6 lbs in 1 night and the night before 3 lbs gain   If swelling, where is the swelling located? Unsure   Are you currently taking a fluid pill? Yes  Are you currently SOB? Yes  Do you have a log of your daily weights (if so, list)?   Have you gained 3 pounds in a day or 5 pounds in a week? Yes   Have you traveled recently? No

## 2023-09-10 NOTE — Progress Notes (Signed)
PCP: Merita Norton, FNP (last seen 06/24) Primary Cardiologist: Debbe Odea, MD (last seen 07/24)  HPI:  [name] is a 89 year old female with a history of afib, pacemaker, and chronic heart failure.   Was in the ED on 10/09/22 due to a fall. Was in the ED 04/08/23 due to a mechanical fall.  Echo 02/26/22: EF of 60-65% along with mild LVH, moderately elevated PA pressure, moderate MR, moderate/severe TR, mild/moderate AS and severe LAE.   She presents today for an acute HF visit with a chief complaint of moderate fatigue with minimal exertion. Chronic in nature although has worsened over the last 48 hours. Has associated left leg cramping, lethargy, decreased appetite, worsening shortness of breath, weight gain and slight increase in pedal edema. Denies chest pain, palpitations, cough or dizziness. Overall she says that she feels terrible.   Weighed 114.9 pounds at home yesterday morning and then gained 6 pounds overnight last night = ~ 9 pounds over the last 2 days. Has not taken any metolazone.    ROS: All systems negative except as listed in HPI, PMH and Problem List.  SH:  Social History   Socioeconomic History   Marital status: Widowed    Spouse name: Not on file   Number of children: Not on file   Years of education: Not on file   Highest education level: Not on file  Occupational History   Not on file  Tobacco Use   Smoking status: Never   Smokeless tobacco: Never  Substance and Sexual Activity   Alcohol use: Not Currently   Drug use: Never   Sexual activity: Not on file  Other Topics Concern   Not on file  Social History Narrative   Not on file   Social Determinants of Health   Financial Resource Strain: Low Risk  (07/08/2023)   Overall Financial Resource Strain (CARDIA)    Difficulty of Paying Living Expenses: Not hard at all  Food Insecurity: No Food Insecurity (07/08/2023)   Hunger Vital Sign    Worried About Running Out of Food in the Last Year: Never  true    Ran Out of Food in the Last Year: Never true  Transportation Needs: No Transportation Needs (07/08/2023)   PRAPARE - Administrator, Civil Service (Medical): No    Lack of Transportation (Non-Medical): No  Physical Activity: Inactive (07/08/2023)   Exercise Vital Sign    Days of Exercise per Week: 0 days    Minutes of Exercise per Session: 0 min  Stress: No Stress Concern Present (07/08/2023)   [employer] of Occupational Health - Occupational Stress Questionnaire    Feeling of Stress : Not at all  Social Connections: Moderately Isolated (07/08/2023)   Social Connection and Isolation Panel [NHANES]    Frequency of Communication with Friends and Family: More than three times a week    Frequency of Social Gatherings with Friends and Family: More than three times a week    Attends Religious Services: More than 4 times per year    Active Member of [employer] or Organizations: No    Attends Banker Meetings: Never    Marital Status: Widowed  Intimate Partner Violence: Not At Risk (07/08/2023)   Humiliation, Afraid, Rape, and Kick questionnaire    Fear of Current or Ex-Partner: No    Emotionally Abused: No    Physically Abused: No    Sexually Abused: No    FH:  Family History  Problem Relation Age  of Onset   Heart failure Mother    Heart attack Father    Heart attack Daughter     Past Medical History:  Diagnosis Date   Arrhythmia    atrial fibrillation   Congestive heart failure (CHF) (HCC)    Hypertension    Pacemaker 2015    Current Outpatient Medications  Medication Sig Dispense Refill   amLODipine (NORVASC) 5 MG tablet TAKE 1 TABLET (5 MG TOTAL) BY MOUTH DAILY. 90 tablet 0   atorvastatin (LIPITOR) 10 MG tablet TAKE 1 TABLET (10 MG TOTAL) BY MOUTH DAILY. PLEASE SCHEDULE OFFICE VISIT BEFORE ANY FUTURE REFILL 90 tablet 0   calcium carbonate (OS-CAL - DOSED IN MG OF ELEMENTAL CALCIUM) 1250 (500 Ca) MG tablet Take 1 tablet by mouth 2 (two) times  daily with a meal.     dipyridamole-aspirin (AGGRENOX) 200-25 MG 12hr capsule TAKE 1 CAPSULE BY MOUTH TWICE A DAY 180 capsule 0   JARDIANCE 10 MG TABS tablet TAKE 1 TABLET (10 MG TOTAL) BY MOUTH DAILY. NEEDS APPT FOR FURTHER REFILLS 90 tablet 2   loratadine (CLARITIN) 10 MG tablet Take 10 mg by mouth daily.     losartan (COZAAR) 50 MG tablet TAKE 1 TABLET BY MOUTH TWICE A DAY 180 tablet 1   metolazone (ZAROXOLYN) 2.5 MG tablet Take 1 tablet (2.5 mg total) by mouth 3 (three) times a week. Take 1 tablet (2.5 mg) by mouth 2 times a week Monday and Thursday     metoprolol (TOPROL-XL) 200 MG 24 hr tablet TAKE 1 TABLET BY MOUTH EVERY DAY 90 tablet 0   Multiple Vitamins-Minerals (PRESERVISION AREDS 2 PO) Take 1 tablet by mouth in the morning and at bedtime.     multivitamin-iron-minerals-folic acid (CENTRUM) chewable tablet Chew 1 tablet by mouth daily.     Omega-3 Fatty Acids (FISH OIL) 1000 MG CAPS Take 1 capsule by mouth daily.     potassium chloride (KLOR-CON) 10 MEQ tablet Take 10 mEq by mouth as needed. Only if she takes an additional dose of lasix     torsemide (DEMADEX) 20 MG tablet TAKE 2 TABLETS (40 MG TOTAL) BY MOUTH DAILY. AND ADDITIONAL 20MG  IF NEEDED 180 tablet 0   No current facility-administered medications for this visit.   Vitals:   09/10/23 1504  BP: 116/62  Pulse: (!) 53  SpO2: 94%  Weight: 122 lb 8 oz (55.6 kg)   Wt Readings from Last 3 Encounters:  09/10/23 122 lb 8 oz (55.6 kg)  07/08/23 113 lb (51.3 kg)  07/01/23 113 lb (51.3 kg)   Lab Results  Component Value Date   CREATININE 1.36 (H) 06/23/2023   CREATININE 1.02 (H) 05/27/2023   CREATININE 0.87 04/08/2023    PHYSICAL EXAM:  General:  Well appearing. No resp difficulty HEENT: normal Neck: supple. JVP elevated. No lymphadenopathy or thryomegaly appreciated. Cor: PMI normal. Regular rhythm, bradycardic. No rubs, gallops or murmurs. Lungs: clear Abdomen: soft, nontender, nondistended. No hepatosplenomegaly. No  bruits or masses.  Extremities: no cyanosis, clubbing, rash, edema Neuro: alert & oriented x3, cranial nerves grossly intact. Moves all 4 extremities w/o difficulty. Affect pleasant.   ECG:not done  ReDs: 36%   ASSESSMENT & PLAN:  Chronic heart failure with preserved ejection fraction- - suspect due to atrial fibrillation - NYHA II - minimally fluid overloaded today - weighing daily; reminded to call for an overnight weight gain of > 2 pounds or a weekly weight gain of > 5 pounds - weight up 9 pounds from  last visit here 2+ months ago - ReDs today is 36% - Echo 02/26/22: EF of 60-65% along with mild LVH, moderately elevated PA pressure, moderate MR, moderate/severe TR, mild/moderate AS and severe LAE.  - goes to an adult day care each day, reports that she eats low sodium diet there - trying to keep daily fluid intake to 60-64 ounces daily - saw cardiology, Dr. Azucena Cecil 07/24 - continue jardiance 10mg  daily - continue losartan 50mg  BID - continue metoprolol succinate 200mg  daily - continue torsemide 40mg  daily and extra 20mg  PRN/ potassium if takes extra torsemide - metolazone 2.5mg  PRN but hasn't taken any - will send for 80mg  IV lasix/ PO potassium today - BMP/ BNP today - BNP 05/27/23 was 603.4  Atrial fibrillation - RRR today, has pacemaker - saw EP Lalla Brothers) 06/23 - BMP 06/23/23 reviewed and showed sodium 127, potassium 3.4, creatinine 1.36 & GFR 36  3: Lymphedema- - stage 2 - wears compression socks daily - is quite active and walked into the office - goes to an adult daycare daily - saw wound center Larina Bras) 07/24 - saw PCP Suzie Portela) 06/24  Return tomorrow, if symptoms worsen, she can go to ED.

## 2023-09-10 NOTE — Progress Notes (Signed)
Patient had lasix IV and Potassium oral pills. VSS stable. Patient was not in distress. No other issue observed.

## 2023-09-10 NOTE — Progress Notes (Unsigned)
PCP: Merita Norton, FNP (last seen 06/24) Primary Cardiologist: Debbe Odea, MD (last seen 07/24)  HPI:  [name] is a 89 year old female with a history of afib, sinus pauses s/p pacemaker (2007, 2015 [employer]) and chronic heart failure.   Was in the ED on 10/09/22 due to a fall. Was in the ED 04/08/23 due to a mechanical fall.  Echo 02/26/22: EF of 60-65% along with mild LVH, moderately elevated PA pressure, moderate MR, moderate/severe TR, mild/moderate AS and severe LAE.   She presents today for a HF f/u visit with a chief complaint of minimal fatigue with moderate exertion. Chronic in nature. She has associated shortness of breath although much better than yesterday. She also has continued pedal edema in bilateral lower legs but better from yesterday. She has right shoulder pain due to having difficulty sleeping last night and she noticed some leg cramping in her left thigh earlier today. She denies chest pain, cough, palpitations, abdominal distention, dizziness or weight gain. She did take an additional 20mg  torsemide this morning due to potassium being elevated yesterday.   Received 80mg  IV lasix/ PO potassium yesterday and says that she feels better from yesterday. Daughter that is present also thinks patient looks better today.   ROS: All systems negative except as listed in HPI, PMH and Problem List.  SH:  Social History   Socioeconomic History   Marital status: Widowed    Spouse name: Not on file   Number of children: Not on file   Years of education: Not on file   Highest education level: Not on file  Occupational History   Not on file  Tobacco Use   Smoking status: Never   Smokeless tobacco: Never  Substance and Sexual Activity   Alcohol use: Not Currently   Drug use: Never   Sexual activity: Not on file  Other Topics Concern   Not on file  Social History Narrative   Not on file   Social Determinants of Health   Financial Resource Strain: Low Risk   (07/08/2023)   Overall Financial Resource Strain (CARDIA)    Difficulty of Paying Living Expenses: Not hard at all  Food Insecurity: No Food Insecurity (07/08/2023)   Hunger Vital Sign    Worried About Running Out of Food in the Last Year: Never true    Ran Out of Food in the Last Year: Never true  Transportation Needs: No Transportation Needs (07/08/2023)   PRAPARE - Administrator, Civil Service (Medical): No    Lack of Transportation (Non-Medical): No  Physical Activity: Inactive (07/08/2023)   Exercise Vital Sign    Days of Exercise per Week: 0 days    Minutes of Exercise per Session: 0 min  Stress: No Stress Concern Present (07/08/2023)   [employer] of Occupational Health - Occupational Stress Questionnaire    Feeling of Stress : Not at all  Social Connections: Moderately Isolated (07/08/2023)   Social Connection and Isolation Panel [NHANES]    Frequency of Communication with Friends and Family: More than three times a week    Frequency of Social Gatherings with Friends and Family: More than three times a week    Attends Religious Services: More than 4 times per year    Active Member of [employer] or Organizations: No    Attends Banker Meetings: Never    Marital Status: Widowed  Intimate Partner Violence: Not At Risk (07/08/2023)   Humiliation, Afraid, Rape, and Kick questionnaire  Fear of Current or Ex-Partner: No    Emotionally Abused: No    Physically Abused: No    Sexually Abused: No    FH:  Family History  Problem Relation Age of Onset   Heart failure Mother    Heart attack Father    Heart attack Daughter     Past Medical History:  Diagnosis Date   Arrhythmia    atrial fibrillation   Congestive heart failure (CHF) (HCC)    Hypertension    Pacemaker 2015    Current Outpatient Medications  Medication Sig Dispense Refill   amLODipine (NORVASC) 5 MG tablet TAKE 1 TABLET (5 MG TOTAL) BY MOUTH DAILY. 90 tablet 0   atorvastatin (LIPITOR)  10 MG tablet TAKE 1 TABLET (10 MG TOTAL) BY MOUTH DAILY. PLEASE SCHEDULE OFFICE VISIT BEFORE ANY FUTURE REFILL 90 tablet 0   calcium carbonate (OS-CAL - DOSED IN MG OF ELEMENTAL CALCIUM) 1250 (500 Ca) MG tablet Take 1 tablet by mouth 2 (two) times daily with a meal.     dipyridamole-aspirin (AGGRENOX) 200-25 MG 12hr capsule TAKE 1 CAPSULE BY MOUTH TWICE A DAY 180 capsule 0   JARDIANCE 10 MG TABS tablet TAKE 1 TABLET (10 MG TOTAL) BY MOUTH DAILY. NEEDS APPT FOR FURTHER REFILLS 90 tablet 2   loratadine (CLARITIN) 10 MG tablet Take 10 mg by mouth daily.     losartan (COZAAR) 50 MG tablet TAKE 1 TABLET BY MOUTH TWICE A DAY 180 tablet 1   metolazone (ZAROXOLYN) 2.5 MG tablet Take 2.5 mg by mouth daily as needed.     metoprolol (TOPROL-XL) 200 MG 24 hr tablet TAKE 1 TABLET BY MOUTH EVERY DAY 90 tablet 0   Multiple Vitamins-Minerals (PRESERVISION AREDS 2 PO) Take 1 tablet by mouth in the morning and at bedtime.     multivitamin-iron-minerals-folic acid (CENTRUM) chewable tablet Chew 1 tablet by mouth daily.     Omega-3 Fatty Acids (FISH OIL) 1000 MG CAPS Take 1 capsule by mouth daily.     potassium chloride (KLOR-CON) 10 MEQ tablet Take 10 mEq by mouth as needed. Only if she takes an additional dose of lasix     torsemide (DEMADEX) 20 MG tablet TAKE 2 TABLETS (40 MG TOTAL) BY MOUTH DAILY. AND ADDITIONAL 20MG  IF NEEDED 180 tablet 0   No current facility-administered medications for this visit.   Vitals:   09/11/23 1510 09/11/23 1513  BP: 119/72   Pulse: 79 73  SpO2: 96% 100%  Weight: 121 lb (54.9 kg)    Wt Readings from Last 3 Encounters:  09/11/23 121 lb (54.9 kg)  09/10/23 122 lb 8 oz (55.6 kg)  07/08/23 113 lb (51.3 kg)   Lab Results  Component Value Date   CREATININE 0.99 09/10/2023   CREATININE 1.36 (H) 06/23/2023   CREATININE 1.02 (H) 05/27/2023   PHYSICAL EXAM:  General:  Well appearing. No resp difficulty HEENT: normal Neck: supple. JVP normal No lymphadenopathy or thryomegaly  appreciated. Cor: PMI normal. Regular rate, rhythm No rubs, gallops or murmurs. Lungs: clear Abdomen: soft, nontender, nondistended. No hepatosplenomegaly. No bruits or masses.  Extremities: no cyanosis, clubbing, rash, edema Neuro: alert & oriented x3, cranial nerves grossly intact. Moves all 4 extremities w/o difficulty. Affect pleasant.   ECG: v-paced with HR 63 (personally reviewed)  ReDs: unable to obtain   ASSESSMENT & PLAN:  Chronic heart failure with preserved ejection fraction- - suspect due to atrial fibrillation - NYHA II - euvolemic today - weighing daily; reminded to call for  an overnight weight gain of > 2 pounds or a weekly weight gain of > 5 pounds - weight down 1.8 pounds from last visit here yesterday - unable to obtain ReDs reading today - Echo 02/26/22: EF of 60-65% along with mild LVH, moderately elevated PA pressure, moderate MR, moderate/severe TR, mild/moderate AS and severe LAE.  - goes to an adult day care each day, reports that she eats low sodium diet there - received IV lasix/ oral potassium yesterday - BMP/ BNP today - trying to keep daily fluid intake to 60-64 ounces daily - saw cardiology, Dr. Azucena Cecil 07/24 - continue jardiance 10mg  daily - continue losartan 50mg  BID - continue metoprolol succinate 200mg  daily - continue torsemide 40mg  daily and extra 20mg  PRN/ potassium if takes extra torsemide - metolazone 2.5mg  PRN but hasn't taken any - BNP 09/10/23 was 816.8  Atrial fibrillation - RRR today, has pacemaker - EKG today is v-paced  - saw EP Lalla Brothers) 06/23 - BMP 09/10/23 reviewed and showed sodium 125, potassium 5.7, creatinine 0.99 & GFR 52  3: Lymphedema- - stage 2 - wears compression socks daily - is quite active and walked into the office - goes to an adult daycare daily - saw wound center Larina Bras) 07/24 - saw PCP Suzie Portela) 06/24  Return in 1 month, sooner if needed.

## 2023-09-10 NOTE — Telephone Encounter (Signed)
Patient's daughter called in with concerns of increased swelling and weight gain. She stated that the patient gained three pounds two days ago and then six pounds overnight for a total of 9 in two days. She stated that the patient is having mild labored breathing. She did not feel like this was emergent yet. She has not had an increase in sodium or fluids to warrant the increased weight.  She currently takes Torsemide 40 mg once daily. She took an extra 20 mg of Torsemide yesterday and today with no changes.   She has not taken any metolazone. She stated that she was advised to not take it until advised to do so.  The daughter has been advised that it may be better to take her to the ED for assessment due to the symptomatic weight gain. She stated that she would if her breathing got worse. She would like to have the providers recommendations prior to doing so.

## 2023-09-10 NOTE — Progress Notes (Signed)
REDS VEST READING= 36% CHEST RULER=27 HEIGHT MARKER= A

## 2023-09-10 NOTE — Telephone Encounter (Signed)
Called daughter [name] and informed her that patient's kidney function looked good. Potassium is high from 3 months ago. Earlier today she was sent for IV lasix/ oral potassium based on previous lab results.   [name] says that patient did take potassium with an extra 20mg  torsemide earlier today as well as yesterday but, otherwise, she doesn't take potassium.   Lindell Noe to have patient take an additional 20mg  torsemide tomorrow but NO potassium. Will recheck lab work tomorrow. She verbalized understanding.

## 2023-09-11 ENCOUNTER — Other Ambulatory Visit: Payer: Self-pay | Admitting: Family

## 2023-09-11 ENCOUNTER — Encounter: Payer: Self-pay | Admitting: Family

## 2023-09-11 ENCOUNTER — Ambulatory Visit: Payer: Medicare PPO | Admitting: Family

## 2023-09-11 ENCOUNTER — Other Ambulatory Visit
Admission: RE | Admit: 2023-09-11 | Discharge: 2023-09-11 | Disposition: A | Discharge status: Home / Self Care | Payer: Medicare PPO | Source: Ambulatory Visit | Attending: Family | Admitting: Family

## 2023-09-11 VITALS — BP 119/72 | HR 73 | Wt 121.0 lb

## 2023-09-11 DIAGNOSIS — I5032 Chronic diastolic (congestive) heart failure: Secondary | ICD-10-CM | POA: Insufficient documentation

## 2023-09-11 DIAGNOSIS — I890 Lymphedema, not elsewhere classified: Secondary | ICD-10-CM

## 2023-09-11 DIAGNOSIS — I4821 Permanent atrial fibrillation: Secondary | ICD-10-CM | POA: Diagnosis not present

## 2023-09-11 LAB — MAGNESIUM: Magnesium: 3 mg/dL — ABNORMAL HIGH (ref 1.7–2.4)

## 2023-09-11 LAB — BRAIN NATRIURETIC PEPTIDE: B Natriuretic Peptide: 720.7 pg/mL — ABNORMAL HIGH (ref 0.0–100.0)

## 2023-09-11 LAB — BASIC METABOLIC PANEL
Anion gap: 8 (ref 5–15)
BUN: 39 mg/dL — ABNORMAL HIGH (ref 8–23)
CO2: 25 mmol/L (ref 22–32)
Calcium: 8.7 mg/dL — ABNORMAL LOW (ref 8.9–10.3)
Chloride: 92 mmol/L — ABNORMAL LOW (ref 98–111)
Creatinine, Ser: 0.95 mg/dL (ref 0.44–1.00)
GFR, Estimated: 55 mL/min — ABNORMAL LOW (ref >60)
Glucose, Bld: 113 mg/dL — ABNORMAL HIGH (ref 70–99)
Potassium: 5.4 mmol/L — ABNORMAL HIGH (ref 3.5–5.1)
Sodium: 125 mmol/L — ABNORMAL LOW (ref 135–145)

## 2023-09-11 NOTE — Progress Notes (Signed)
Remote pacemaker transmission.   

## 2023-09-12 ENCOUNTER — Telehealth: Payer: Self-pay | Admitting: Family

## 2023-09-12 NOTE — Progress Notes (Signed)
Please call daughter and tell her that sometimes when the magnesium level is a little high, people can feel fatigued and dizzy. Taking the extra torsemide will help decrease this level.  She might want to also consider doing a covid test on her. If any urinary symptoms, she needs to get checked for a UTI.   Spoke with pt's daughter regarding the above per [place]. Pt's daughter had good understanding and no further questions.

## 2023-09-17 ENCOUNTER — Telehealth: Payer: Self-pay

## 2023-09-17 ENCOUNTER — Other Ambulatory Visit
Admission: RE | Admit: 2023-09-17 | Discharge: 2023-09-17 | Disposition: A | Discharge status: Home / Self Care | Payer: Medicare PPO | Attending: Family | Admitting: Family

## 2023-09-17 DIAGNOSIS — I5032 Chronic diastolic (congestive) heart failure: Secondary | ICD-10-CM | POA: Insufficient documentation

## 2023-09-17 LAB — BASIC METABOLIC PANEL
Anion gap: 14 (ref 5–15)
BUN: 42 mg/dL — ABNORMAL HIGH (ref 8–23)
CO2: 29 mmol/L (ref 22–32)
Calcium: 9 mg/dL (ref 8.9–10.3)
Chloride: 84 mmol/L — ABNORMAL LOW (ref 98–111)
Creatinine, Ser: 1.23 mg/dL — ABNORMAL HIGH (ref 0.44–1.00)
GFR, Estimated: 40 mL/min — ABNORMAL LOW (ref >60)
Glucose, Bld: 83 mg/dL (ref 70–99)
Potassium: 3.7 mmol/L (ref 3.5–5.1)
Sodium: 127 mmol/L — ABNORMAL LOW (ref 135–145)

## 2023-09-17 LAB — BRAIN NATRIURETIC PEPTIDE: B Natriuretic Peptide: 664.9 pg/mL — ABNORMAL HIGH (ref 0.0–100.0)

## 2023-09-17 NOTE — Telephone Encounter (Signed)
-----   Message from Delma Freeze sent at 09/17/2023  4:06 PM EDT ----- Please call daughter [name]:  Potassium level is better and kidney function within her normal range. BNP level (tells how stressed the heart is) is still elevated but improving. Continue medications

## 2023-09-18 NOTE — Progress Notes (Signed)
Remote pacemaker transmission.   

## 2023-10-01 ENCOUNTER — Encounter: Payer: Self-pay | Admitting: Family

## 2023-10-01 ENCOUNTER — Ambulatory Visit: Payer: Medicare PPO | Attending: Family | Admitting: Family

## 2023-10-01 VITALS — BP 124/61 | HR 76 | Wt 111.0 lb

## 2023-10-01 DIAGNOSIS — I4891 Unspecified atrial fibrillation: Secondary | ICD-10-CM | POA: Insufficient documentation

## 2023-10-01 DIAGNOSIS — I110 Hypertensive heart disease with heart failure: Secondary | ICD-10-CM | POA: Insufficient documentation

## 2023-10-01 DIAGNOSIS — I4821 Permanent atrial fibrillation: Secondary | ICD-10-CM

## 2023-10-01 DIAGNOSIS — Z950 Presence of cardiac pacemaker: Secondary | ICD-10-CM | POA: Diagnosis not present

## 2023-10-01 DIAGNOSIS — I5032 Chronic diastolic (congestive) heart failure: Secondary | ICD-10-CM | POA: Diagnosis not present

## 2023-10-01 DIAGNOSIS — I509 Heart failure, unspecified: Secondary | ICD-10-CM | POA: Diagnosis present

## 2023-10-01 DIAGNOSIS — I890 Lymphedema, not elsewhere classified: Secondary | ICD-10-CM | POA: Diagnosis not present

## 2023-10-01 NOTE — Progress Notes (Signed)
PCP: Merita Norton, FNP (last seen 06/24) Primary Cardiologist: Debbe Odea, MD (last seen 07/24)  HPI:  [name] is a 89 year old female with a history of afib, sinus pauses s/p pacemaker (2007, 2015 [employer]) and chronic heart failure.   Was in the ED on 10/09/22 due to a fall. Was in the ED 04/08/23 due to a mechanical fall.  Echo 02/26/22: EF of 60-65% along with mild LVH, moderately elevated PA pressure, moderate MR, moderate/severe TR, mild/moderate AS and severe LAE.   She presents today for a HF f/u visit with a chief complaint of minimal fatigue with moderate exertion. Chronic in nature although much improved from last visit. She denies shortness of breath, chest pain, cough, palpitations, abdominal distention, pedal edema, dizziness or difficulty sleeping. She says that her appetite is "very good". Overall, she says that she feels quite well today. Has not had to take any metolazone.   Daughter that is present with her says she found out that they were making a medication error earlier this month when patient wasn't feeling well. Patient has macular degeneration so puts an X on the lid of meds that she doesn't take. Somehow the lid got off her macular degeneration bottle and placed on her torsemide bottle. So when she was here earlier this month and wasn't feeling well, it's because she was without her torsemide (because that bottle had the lid with an X) and she was taking 2-3 of her metoprolol (400-600mg  total) because she thought that bottle was her torsemide bottle. Daughter has since corrected this and will look at the labels and not just rely on the lids for future. This error would certainly explain the weight gain, bradycardia and worsening symptoms.   ROS: All systems negative except as listed in HPI, PMH and Problem List.  SH:  Social History   Socioeconomic History   Marital status: Widowed    Spouse name: Not on file   Number of children: Not on file   Years of  education: Not on file   Highest education level: Not on file  Occupational History   Not on file  Tobacco Use   Smoking status: Never   Smokeless tobacco: Never  Substance and Sexual Activity   Alcohol use: Not Currently   Drug use: Never   Sexual activity: Not on file  Other Topics Concern   Not on file  Social History Narrative   Not on file   Social Determinants of Health   Financial Resource Strain: Low Risk  (07/08/2023)   Overall Financial Resource Strain (CARDIA)    Difficulty of Paying Living Expenses: Not hard at all  Food Insecurity: No Food Insecurity (07/08/2023)   Hunger Vital Sign    Worried About Running Out of Food in the Last Year: Never true    Ran Out of Food in the Last Year: Never true  Transportation Needs: No Transportation Needs (07/08/2023)   PRAPARE - Administrator, Civil Service (Medical): No    Lack of Transportation (Non-Medical): No  Physical Activity: Inactive (07/08/2023)   Exercise Vital Sign    Days of Exercise per Week: 0 days    Minutes of Exercise per Session: 0 min  Stress: No Stress Concern Present (07/08/2023)   [employer] of Occupational Health - Occupational Stress Questionnaire    Feeling of Stress : Not at all  Social Connections: Moderately Isolated (07/08/2023)   Social Connection and Isolation Panel [NHANES]    Frequency of Communication with  Friends and Family: More than three times a week    Frequency of Social Gatherings with Friends and Family: More than three times a week    Attends Religious Services: More than 4 times per year    Active Member of [employer] or Organizations: No    Attends Banker Meetings: Never    Marital Status: Widowed  Intimate Partner Violence: Not At Risk (07/08/2023)   Humiliation, Afraid, Rape, and Kick questionnaire    Fear of Current or Ex-Partner: No    Emotionally Abused: No    Physically Abused: No    Sexually Abused: No    FH:  Family History  Problem  Relation Age of Onset   Heart failure Mother    Heart attack Father    Heart attack Daughter     Past Medical History:  Diagnosis Date   Arrhythmia    atrial fibrillation   Congestive heart failure (CHF) (HCC)    Hypertension    Pacemaker 2015    Current Outpatient Medications  Medication Sig Dispense Refill   amLODipine (NORVASC) 5 MG tablet TAKE 1 TABLET (5 MG TOTAL) BY MOUTH DAILY. 90 tablet 0   atorvastatin (LIPITOR) 10 MG tablet TAKE 1 TABLET (10 MG TOTAL) BY MOUTH DAILY. PLEASE SCHEDULE OFFICE VISIT BEFORE ANY FUTURE REFILL 90 tablet 0   calcium carbonate (OS-CAL - DOSED IN MG OF ELEMENTAL CALCIUM) 1250 (500 Ca) MG tablet Take 1 tablet by mouth 2 (two) times daily with a meal.     dipyridamole-aspirin (AGGRENOX) 200-25 MG 12hr capsule TAKE 1 CAPSULE BY MOUTH TWICE A DAY 180 capsule 0   JARDIANCE 10 MG TABS tablet TAKE 1 TABLET (10 MG TOTAL) BY MOUTH DAILY. NEEDS APPT FOR FURTHER REFILLS 90 tablet 2   loratadine (CLARITIN) 10 MG tablet Take 10 mg by mouth daily.     losartan (COZAAR) 50 MG tablet TAKE 1 TABLET BY MOUTH TWICE A DAY 180 tablet 1   metolazone (ZAROXOLYN) 2.5 MG tablet Take 2.5 mg by mouth daily as needed. (Patient not taking: Reported on 09/11/2023)     metoprolol (TOPROL-XL) 200 MG 24 hr tablet TAKE 1 TABLET BY MOUTH EVERY DAY 90 tablet 0   Multiple Vitamins-Minerals (PRESERVISION AREDS 2 PO) Take 1 tablet by mouth in the morning and at bedtime.     multivitamin-iron-minerals-folic acid (CENTRUM) chewable tablet Chew 1 tablet by mouth daily.     Omega-3 Fatty Acids (FISH OIL) 1000 MG CAPS Take 1 capsule by mouth daily.     potassium chloride (KLOR-CON) 10 MEQ tablet Take 10 mEq by mouth as needed. Only if she takes an additional dose of lasix     torsemide (DEMADEX) 20 MG tablet TAKE 2 TABLETS (40 MG TOTAL) BY MOUTH DAILY. AND ADDITIONAL 20MG  IF NEEDED 180 tablet 0   No current facility-administered medications for this visit.   Vitals:   10/01/23 1519  BP:  124/61  Pulse: 76  SpO2: 96%  Weight: 111 lb (50.3 kg)   Wt Readings from Last 3 Encounters:  10/01/23 111 lb (50.3 kg)  09/11/23 121 lb (54.9 kg)  09/10/23 122 lb 8 oz (55.6 kg)   Lab Results  Component Value Date   CREATININE 1.23 (H) 09/17/2023   CREATININE 0.95 09/11/2023   CREATININE 0.99 09/10/2023   PHYSICAL EXAM:  General:  Well appearing. No resp difficulty HEENT: normal Neck: supple. JVP normal No lymphadenopathy or thryomegaly appreciated. Cor: PMI normal. Regular rate, rhythm No rubs, gallops or murmurs. Lungs:  clear Abdomen: soft, nontender, nondistended. No hepatosplenomegaly. No bruits or masses.  Extremities: no cyanosis, clubbing, rash, trace pitting edema bilateral lower legs Neuro: alert & oriented x3, cranial nerves grossly intact. Moves all 4 extremities w/o difficulty. Affect pleasant.   ECG: not done   ASSESSMENT & PLAN:  Chronic heart failure with preserved ejection fraction- - suspect due to atrial fibrillation - NYHA II - euvolemic today - weighing daily; reminded to call for an overnight weight gain of > 2 pounds or a weekly weight gain of > 5 pounds - weight down 10 pounds from last visit here 3 weeks ago - Echo 02/26/22: EF of 60-65% along with mild LVH, moderately elevated PA pressure, moderate MR, moderate/severe TR, mild/moderate AS and severe LAE.  - goes to an adult day care each day, reports that she eats low sodium diet there - trying to keep daily fluid intake to 60-64 ounces daily - saw cardiology, Dr. Azucena Cecil 07/24 - continue jardiance 10mg  daily - continue losartan 50mg  BID - continue metoprolol succinate 200mg  daily - continue torsemide 40mg  daily and extra 20mg  PRN/ potassium if takes extra torsemide - metolazone 2.5mg  PRN but hasn't taken any recently - BNP 09/17/23 was 664.9  Atrial fibrillation - RRR today, has pacemaker - saw EP Lalla Brothers) 06/23 - BMP 09/17/23 reviewed and showed sodium 127, potassium 3.7,  creatinine 1.23 & GFR 10  3: Lymphedema- - stage 2 - wears compression socks daily - is quite active and walked into the office - goes to an adult daycare daily - saw wound center Larina Bras) 07/24 - saw PCP Suzie Portela) 06/24  Return in 3 months, sooner if needed.

## 2023-10-01 NOTE — Patient Instructions (Signed)
It was good to see you today!

## 2023-10-08 ENCOUNTER — Ambulatory Visit: Payer: Medicare PPO | Admitting: Internal Medicine

## 2023-10-14 ENCOUNTER — Encounter: Payer: Medicare PPO | Admitting: Family

## 2023-10-31 ENCOUNTER — Other Ambulatory Visit: Payer: Self-pay | Admitting: Family Medicine

## 2023-11-11 ENCOUNTER — Other Ambulatory Visit: Payer: Self-pay | Admitting: Family Medicine

## 2023-11-11 ENCOUNTER — Other Ambulatory Visit: Payer: Self-pay

## 2023-11-11 MED ORDER — POTASSIUM CHLORIDE ER 10 MEQ PO TBCR
10.0000 meq | EXTENDED_RELEASE_TABLET | ORAL | 0 refills | Status: DC | PRN
  Filled 2023-11-11: qty 90, 90d supply, fill #0

## 2023-11-12 ENCOUNTER — Encounter: Payer: Self-pay | Admitting: Family Medicine

## 2023-11-12 ENCOUNTER — Telehealth: Payer: Self-pay | Admitting: Family Medicine

## 2023-11-12 ENCOUNTER — Other Ambulatory Visit: Payer: Self-pay

## 2023-11-12 NOTE — Telephone Encounter (Signed)
Patient's Daughter walked in today and stated [facility] on [address] stated that they can't refill Metoprolol request until 11/30/23. Because there was a med mix up. Patient accidentally took extra Metoprolol and no Torsemide. We now have a better routine in place,so it wont happen again. Can you check with the pharmacy to see if can be filled(insurance pay on 11/21/2023. Please follow up with Daughter. Can wait until Provider return on 11/14/2023.Marland KitchenPer Daughter

## 2023-11-14 ENCOUNTER — Other Ambulatory Visit: Payer: Self-pay | Admitting: Family Medicine

## 2023-11-14 MED ORDER — METOPROLOL SUCCINATE ER 200 MG PO TB24: 200.0000 mg | ORAL_TABLET | Freq: Every day | ORAL | 0 refills | Status: DC

## 2023-11-14 MED ORDER — TORSEMIDE 20 MG PO TABS: 40.0000 mg | ORAL_TABLET | Freq: Every day | ORAL | 0 refills | Status: DC

## 2023-11-22 ENCOUNTER — Other Ambulatory Visit: Payer: Self-pay | Admitting: Family Medicine

## 2023-11-24 NOTE — Telephone Encounter (Signed)
Requested Prescriptions  Pending Prescriptions Disp Refills   dipyridamole-aspirin (AGGRENOX) 200-25 MG 12hr capsule [Pharmacy Med Name: ASPIRIN-DIPYRIDAM ER 25-200 MG] 180 capsule 0    Sig: TAKE 1 CAPSULE BY MOUTH TWICE A DAY     Hematology:  Antiplatelets - aspirin / dipyridamole Failed - 11/24/2023  8:43 AM      Failed - Cr in normal range and within 360 days    Creatinine, Ser  Date Value Ref Range Status  09/17/2023 1.23 (H) 0.44 - 1.00 mg/dL Final         Passed - eGFR is 10 or above and within 360 days    GFR, Estimated  Date Value Ref Range Status  09/17/2023 40 (L) >60 mL/min Final    Comment:    (NOTE) Calculated using the CKD-EPI Creatinine Equation (2021)    eGFR  Date Value Ref Range Status  09/26/2022 54 (L) >59 mL/min/1.73 Final         Passed - Patient is not pregnant      Passed - Valid encounter within last 12 months    Recent Outpatient Visits           6 months ago Viral URI with cough   [facility] Merita Norton T, FNP   7 months ago Skin tear of left lower leg without complication, sequela   [facility] [name], FNP   1 year ago Annual physical exam   [facility] [name], FNP   1 year ago Cough, unspecified type   [facility] Malva Limes, MD   1 year ago Irritability   [facility] [place], [place], PA-C       Future Appointments             In 2 days [name], FNP [facility], [facility]

## 2023-11-26 ENCOUNTER — Ambulatory Visit (INDEPENDENT_AMBULATORY_CARE_PROVIDER_SITE_OTHER): Payer: Medicare PPO | Admitting: Family Medicine

## 2023-11-26 ENCOUNTER — Encounter: Payer: Self-pay | Admitting: Family Medicine

## 2023-11-26 VITALS — BP 122/59 | HR 84 | Ht 59.0 in | Wt 115.3 lb

## 2023-11-26 DIAGNOSIS — Z950 Presence of cardiac pacemaker: Secondary | ICD-10-CM

## 2023-11-26 DIAGNOSIS — I480 Paroxysmal atrial fibrillation: Secondary | ICD-10-CM

## 2023-11-26 DIAGNOSIS — Z0000 Encounter for general adult medical examination without abnormal findings: Secondary | ICD-10-CM | POA: Diagnosis not present

## 2023-11-26 DIAGNOSIS — I5043 Acute on chronic combined systolic (congestive) and diastolic (congestive) heart failure: Secondary | ICD-10-CM

## 2023-11-26 DIAGNOSIS — B9689 Other specified bacterial agents as the cause of diseases classified elsewhere: Secondary | ICD-10-CM | POA: Diagnosis not present

## 2023-11-26 DIAGNOSIS — R7309 Other abnormal glucose: Secondary | ICD-10-CM

## 2023-11-26 DIAGNOSIS — J208 Acute bronchitis due to other specified organisms: Secondary | ICD-10-CM

## 2023-11-26 DIAGNOSIS — I10 Essential (primary) hypertension: Secondary | ICD-10-CM | POA: Diagnosis not present

## 2023-11-26 MED ORDER — ATORVASTATIN CALCIUM 10 MG PO TABS: 10.0000 mg | ORAL_TABLET | Freq: Every day | ORAL | 0 refills | Status: DC

## 2023-11-26 MED ORDER — AMOXICILLIN 500 MG PO CAPS: 500.0000 mg | ORAL_CAPSULE | Freq: Two times a day (BID) | ORAL | 0 refills | Status: AC

## 2023-11-26 MED ORDER — POTASSIUM CHLORIDE ER 10 MEQ PO TBCR: 10.0000 meq | EXTENDED_RELEASE_TABLET | ORAL | 0 refills | Status: DC | PRN

## 2023-11-26 MED ORDER — OPTICHAMBER DIAMOND MISC: 0 refills | Status: DC

## 2023-11-26 MED ORDER — LOSARTAN POTASSIUM 50 MG PO TABS: 50.0000 mg | ORAL_TABLET | Freq: Two times a day (BID) | ORAL | 1 refills | Status: DC

## 2023-11-26 MED ORDER — ALBUTEROL SULFATE HFA 108 (90 BASE) MCG/ACT IN AERS: 2.0000 | INHALATION_SPRAY | Freq: Four times a day (QID) | RESPIRATORY_TRACT | 2 refills | Status: DC | PRN

## 2023-11-26 MED ORDER — AMLODIPINE BESYLATE 5 MG PO TABS: 5.0000 mg | ORAL_TABLET | Freq: Every day | ORAL | 0 refills | Status: DC

## 2023-12-01 DIAGNOSIS — R7309 Other abnormal glucose: Secondary | ICD-10-CM | POA: Diagnosis not present

## 2023-12-02 LAB — TSH: TSH: 5.26 u[IU]/mL — ABNORMAL HIGH (ref 0.450–4.500)

## 2023-12-02 LAB — COMPREHENSIVE METABOLIC PANEL
ALT: 20 [IU]/L (ref 0–32)
AST: 26 [IU]/L (ref 0–40)
Albumin: 3.9 g/dL (ref 3.6–4.6)
Alkaline Phosphatase: 70 [IU]/L (ref 44–121)
BUN/Creatinine Ratio: 44 — ABNORMAL HIGH (ref 12–28)
BUN: 48 mg/dL — ABNORMAL HIGH (ref 10–36)
Bilirubin Total: 0.7 mg/dL (ref 0.0–1.2)
CO2: 26 mmol/L (ref 20–29)
Calcium: 9.4 mg/dL (ref 8.7–10.3)
Chloride: 96 mmol/L (ref 96–106)
Creatinine, Ser: 1.09 mg/dL — ABNORMAL HIGH (ref 0.57–1.00)
Globulin, Total: 2.9 g/dL (ref 1.5–4.5)
Glucose: 95 mg/dL (ref 70–99)
Potassium: 4.1 mmol/L (ref 3.5–5.2)
Sodium: 137 mmol/L (ref 134–144)
Total Protein: 6.8 g/dL (ref 6.0–8.5)
eGFR: 46 mL/min/{1.73_m2} — ABNORMAL LOW (ref >59)

## 2023-12-02 LAB — URINALYSIS, ROUTINE W REFLEX MICROSCOPIC
Bilirubin, UA: NEGATIVE
Glucose, UA: NEGATIVE
Ketones, UA: NEGATIVE
Leukocytes,UA: NEGATIVE
Nitrite, UA: NEGATIVE
Protein,UA: NEGATIVE
RBC, UA: NEGATIVE
Specific Gravity, UA: 1.011 (ref 1.005–1.030)
Urobilinogen, Ur: 0.2 mg/dL (ref 0.2–1.0)
pH, UA: 7 (ref 5.0–7.5)

## 2023-12-02 LAB — CBC WITH DIFFERENTIAL/PLATELET
Basophils Absolute: 0 10*3/uL (ref 0.0–0.2)
Basos: 0 %
EOS (ABSOLUTE): 0.1 10*3/uL (ref 0.0–0.4)
Eos: 2 %
Hematocrit: 37.9 % (ref 34.0–46.6)
Hemoglobin: 12.2 g/dL (ref 11.1–15.9)
Immature Grans (Abs): 0 10*3/uL (ref 0.0–0.1)
Immature Granulocytes: 0 %
Lymphocytes Absolute: 1.1 10*3/uL (ref 0.7–3.1)
Lymphs: 24 %
MCH: 32.6 pg (ref 26.6–33.0)
MCHC: 32.2 g/dL (ref 31.5–35.7)
MCV: 101 fL — ABNORMAL HIGH (ref 79–97)
Monocytes Absolute: 0.5 10*3/uL (ref 0.1–0.9)
Monocytes: 11 %
Neutrophils Absolute: 2.9 10*3/uL (ref 1.4–7.0)
Neutrophils: 63 %
Platelets: 206 10*3/uL (ref 150–450)
RBC: 3.74 x10E6/uL — ABNORMAL LOW (ref 3.77–5.28)
RDW: 13 % (ref 11.7–15.4)
WBC: 4.6 10*3/uL (ref 3.4–10.8)

## 2023-12-02 LAB — LIPID PANEL
Chol/HDL Ratio: 1.8 {ratio} (ref 0.0–4.4)
Cholesterol, Total: 140 mg/dL (ref 100–199)
HDL: 77 mg/dL (ref >39)
LDL Chol Calc (NIH): 52 mg/dL (ref 0–99)
Triglycerides: 52 mg/dL (ref 0–149)
VLDL Cholesterol Cal: 11 mg/dL (ref 5–40)

## 2023-12-02 LAB — HEMOGLOBIN A1C
Est. average glucose Bld gHb Est-mCnc: 128 mg/dL
Hgb A1c MFr Bld: 6.1 % — ABNORMAL HIGH (ref 4.8–5.6)

## 2023-12-02 LAB — IRON,TIBC AND FERRITIN PANEL
Ferritin: 63 ng/mL (ref 15–150)
Iron Saturation: 24 % (ref 15–55)
Iron: 78 ug/dL (ref 27–139)
Total Iron Binding Capacity: 326 ug/dL (ref 250–450)
UIBC: 248 ug/dL (ref 118–369)

## 2023-12-04 NOTE — Progress Notes (Addendum)
Improved creatinine; continue to monitor under new PCP Dr Roxan Hockey.   Labs indicate pre-diabetic and subclinical thyroidism. Continue to monitor.  No urine infection noted.      Complete physical exam   Patient: [name]   DOB: [birth year]   89 y.o. Female  MRN: [id number] Visit Date: 11/26/2023  Today's healthcare provider: [name], FNP   Chief Complaint  Patient presents with   Annual Exam    AWV 07/08/23; CPE 09/26/22 Diet - low salt diet  Exercise - daily for half hour and sometimes twice a day when able at daycare center Feeling - well unless she has a cold or something.  Sleeping - well  Concerns- taking  2 tylenol extra strength for about a month for tinnitus in upper left arm and taking mucus extended release 600 mg off and on for a month. NO other concerns to report    Subjective    [name] is a 89 y.o. female who presents today for a complete physical exam.  She reports consuming a low sodium diet. Gym/ health club routine includes 1/2-1 hour 5 days/week and adult day center. She generally feels fairly well. She reports sleeping fairly well. She does have additional problems to discuss today.  HPI HPI     Annual Exam    Additional comments: AWV 07/08/23; CPE 09/26/22 Diet - low salt diet  Exercise - daily for half hour and sometimes twice a day when able at daycare center Feeling - well unless she has a cold or something.  Sleeping - well  Concerns- taking  2 tylenol extra strength for about a month for tinnitus in upper left arm and taking mucus extended release 600 mg off and on for a month. NO other concerns to report       Last edited by Acey Lav, CMA on 11/26/2023  3:27 PM.      Past Medical History:  Diagnosis Date   Arrhythmia    atrial fibrillation   Congestive heart failure (CHF) (HCC)    Hypertension    Pacemaker 2015   Past Surgical History:  Procedure Laterality Date   ABDOMINAL HYSTERECTOMY     EYE SURGERY      MOHS SURGERY  2023   PACEMAKER PLACEMENT N/A 2007   2017   Social History   Socioeconomic History   Marital status: Widowed    Spouse name: Not on file   Number of children: Not on file   Years of education: Not on file   Highest education level: Some college, no degree  Occupational History   Not on file  Tobacco Use   Smoking status: Never   Smokeless tobacco: Never  Substance and Sexual Activity   Alcohol use: Not Currently   Drug use: Never   Sexual activity: Not on file  Other Topics Concern   Not on file  Social History Narrative   Not on file   Social Drivers of Health   Financial Resource Strain: Low Risk  (11/24/2023)   Overall Financial Resource Strain (CARDIA)    Difficulty of Paying Living Expenses: Not hard at all  Food Insecurity: No Food Insecurity (11/24/2023)   Hunger Vital Sign    Worried About Running Out of Food in the Last Year: Never true    Ran Out of Food in the Last Year: Never true  Transportation Needs: No Transportation Needs (11/24/2023)   PRAPARE - Transportation    Lack of Transportation (Medical): No    Lack of  Transportation (Non-Medical): No  Physical Activity: Sufficiently Active (11/24/2023)   Exercise Vital Sign    Days of Exercise per Week: 7 days    Minutes of Exercise per Session: 30 min  Stress: No Stress Concern Present (11/24/2023)   [employer] of Occupational Health - Occupational Stress Questionnaire    Feeling of Stress : Not at all  Social Connections: Moderately Isolated (11/24/2023)   Social Connection and Isolation Panel [NHANES]    Frequency of Communication with Friends and Family: More than three times a week    Frequency of Social Gatherings with Friends and Family: More than three times a week    Attends Religious Services: More than 4 times per year    Active Member of [employer] or Organizations: No    Attends Banker Meetings: Never    Marital Status: Widowed  Intimate Partner Violence: Not  At Risk (07/08/2023)   Humiliation, Afraid, Rape, and Kick questionnaire    Fear of Current or Ex-Partner: No    Emotionally Abused: No    Physically Abused: No    Sexually Abused: No   Family Status  Relation Name Status   Mother  Deceased   Father  Deceased   Daughter  Alive  No partnership data on file   Family History  Problem Relation Age of Onset   Heart failure Mother    Heart attack Father    Heart attack Daughter    Allergies  Allergen Reactions   Moxifloxacin Itching and Swelling    Swelling and itching of the eyes.   Tobramycin Itching and Swelling    Swelling and itching of the eyelids.   Codeine     Other reaction(s): Hallucination    Patient Care Team: Ronnald Ramp, MD as PCP - General (Family Medicine) Lanier Prude, MD as PCP - Electrophysiology (Cardiology) Debbe Odea, MD as PCP - Cardiology (Cardiology)   Medications: Outpatient Medications Prior to Visit  Medication Sig   calcium carbonate (OS-CAL - DOSED IN MG OF ELEMENTAL CALCIUM) 1250 (500 Ca) MG tablet Take 1 tablet by mouth 2 (two) times daily with a meal.   dipyridamole-aspirin (AGGRENOX) 200-25 MG 12hr capsule TAKE 1 CAPSULE BY MOUTH TWICE A DAY   JARDIANCE 10 MG TABS tablet TAKE 1 TABLET (10 MG TOTAL) BY MOUTH DAILY. NEEDS APPT FOR FURTHER REFILLS   loratadine (CLARITIN) 10 MG tablet Take 10 mg by mouth daily.   metolazone (ZAROXOLYN) 2.5 MG tablet Take 2.5 mg by mouth daily as needed.   metoprolol (TOPROL-XL) 200 MG 24 hr tablet Take 1 tablet (200 mg total) by mouth daily.   Multiple Vitamins-Minerals (PRESERVISION AREDS 2 PO) Take 1 tablet by mouth in the morning and at bedtime.   multivitamin-iron-minerals-folic acid (CENTRUM) chewable tablet Chew 1 tablet by mouth daily.   Omega-3 Fatty Acids (FISH OIL) 1000 MG CAPS Take 1 capsule by mouth daily.   torsemide (DEMADEX) 20 MG tablet Take 2 tablets (40 mg total) by mouth daily. Take an additional 20 mg as needed based  on daily weights.   [DISCONTINUED] amLODipine (NORVASC) 5 MG tablet TAKE 1 TABLET (5 MG TOTAL) BY MOUTH DAILY.   [DISCONTINUED] atorvastatin (LIPITOR) 10 MG tablet TAKE 1 TABLET (10 MG TOTAL) BY MOUTH DAILY. PLEASE SCHEDULE OFFICE VISIT BEFORE ANY FUTURE REFILL   [DISCONTINUED] losartan (COZAAR) 50 MG tablet TAKE 1 TABLET BY MOUTH TWICE A DAY   [DISCONTINUED] potassium chloride (KLOR-CON) 10 MEQ tablet Take 1 tablet (10 mEq total) by mouth as  needed. Only if she takes an additional dose of lasix   No facility-administered medications prior to visit.    Review of Systems Last CBC Lab Results  Component Value Date   WBC 4.6 12/01/2023   HGB 12.2 12/01/2023   HCT 37.9 12/01/2023   MCV 101 (H) 12/01/2023   MCH 32.6 12/01/2023   RDW 13.0 12/01/2023   PLT 206 12/01/2023   Last metabolic panel Lab Results  Component Value Date   GLUCOSE 95 12/01/2023   NA 137 12/01/2023   K 4.1 12/01/2023   CL 96 12/01/2023   CO2 26 12/01/2023   BUN 48 (H) 12/01/2023   CREATININE 1.09 (H) 12/01/2023   EGFR 46 (L) 12/01/2023   CALCIUM 9.4 12/01/2023   PROT 6.8 12/01/2023   ALBUMIN 3.9 12/01/2023   LABGLOB 2.9 12/01/2023   AGRATIO 1.4 09/26/2022   BILITOT 0.7 12/01/2023   ALKPHOS 70 12/01/2023   AST 26 12/01/2023   ALT 20 12/01/2023   ANIONGAP 14 09/17/2023   Last lipids Lab Results  Component Value Date   CHOL 140 12/01/2023   HDL 77 12/01/2023   LDLCALC 52 12/01/2023   TRIG 52 12/01/2023   CHOLHDL 1.8 12/01/2023   Last hemoglobin A1c Lab Results  Component Value Date   HGBA1C 6.1 (H) 12/01/2023   Last thyroid functions Lab Results  Component Value Date   TSH 5.260 (H) 12/01/2023   Last vitamin D No results found for: "25OHVITD2", "25OHVITD3", "VD25OH" Last vitamin B12 and Folate No results found for: "VITAMINB12", "FOLATE"   Objective    BP (!) 122/59 (BP Location: Right Arm, Patient Position: Sitting, Cuff Size: Normal)   Pulse 84   Ht 4\' 11"  (1.499 m)   Wt 115 lb 4.8  oz (52.3 kg)   LMP  (LMP Unknown)   SpO2 98%   BMI 23.29 kg/m   BP Readings from Last 3 Encounters:  11/26/23 (!) 122/59  10/01/23 124/61  09/11/23 119/72   Wt Readings from Last 3 Encounters:  11/26/23 115 lb 4.8 oz (52.3 kg)  10/01/23 111 lb (50.3 kg)  09/11/23 121 lb (54.9 kg)   SpO2 Readings from Last 3 Encounters:  11/26/23 98%  10/01/23 96%  09/11/23 100%   Physical Exam Vitals and nursing note reviewed.  Constitutional:      General: She is awake. She is not in acute distress.    Appearance: Normal appearance. She is well-developed, well-groomed and normal weight. She is not ill-appearing, toxic-appearing or diaphoretic.  HENT:     Head: Normocephalic and atraumatic.     Jaw: There is normal jaw occlusion. No trismus, tenderness, swelling or pain on movement.     Right Ear: Tympanic membrane, ear canal and external ear normal. Decreased hearing noted. There is no impacted cerumen.     Left Ear: Tympanic membrane, ear canal and external ear normal. Decreased hearing noted. There is no impacted cerumen.     Ears:     Comments: Significant visual loss; use of glasses Use of bilateral aids to assist hearing    Nose: Nose normal. No congestion or rhinorrhea.     Right Turbinates: Not enlarged, swollen or pale.     Left Turbinates: Not enlarged, swollen or pale.     Right Sinus: No maxillary sinus tenderness or frontal sinus tenderness.     Left Sinus: No maxillary sinus tenderness or frontal sinus tenderness.     Mouth/Throat:     Lips: Pink.     Mouth: Mucous  membranes are moist. No injury.     Tongue: No lesions.     Pharynx: Oropharynx is clear. Uvula midline. No pharyngeal swelling, oropharyngeal exudate, posterior oropharyngeal erythema or uvula swelling.     Tonsils: No tonsillar exudate or tonsillar abscesses.  Eyes:     General: Lids are normal. Lids are everted, no foreign bodies appreciated. Vision grossly intact. Gaze aligned appropriately. No allergic  shiner or visual field deficit.       Right eye: No discharge.        Left eye: No discharge.     Extraocular Movements: Extraocular movements intact.     Conjunctiva/sclera: Conjunctivae normal.     Right eye: Right conjunctiva is not injected. No exudate.    Left eye: Left conjunctiva is not injected. No exudate.    Pupils: Pupils are equal, round, and reactive to light.  Neck:     Thyroid: No thyroid mass, thyromegaly or thyroid tenderness.     Vascular: No carotid bruit.     Trachea: Trachea normal.  Cardiovascular:     Rate and Rhythm: Normal rate and regular rhythm.     Pulses: Normal pulses.          Carotid pulses are 2+ on the right side and 2+ on the left side.      Radial pulses are 2+ on the right side and 2+ on the left side.       Dorsalis pedis pulses are 2+ on the right side and 2+ on the left side.       Posterior tibial pulses are 2+ on the right side and 2+ on the left side.     Heart sounds: Normal heart sounds, S1 normal and S2 normal. No murmur heard.    No friction rub. No gallop.  Pulmonary:     Effort: Pulmonary effort is normal. No respiratory distress.     Breath sounds: Normal breath sounds and air entry. No stridor. No wheezing, rhonchi or rales.  Chest:     Chest wall: No tenderness.  Abdominal:     General: Abdomen is flat. Bowel sounds are normal. There is no distension.     Palpations: Abdomen is soft. There is no mass.     Tenderness: There is no abdominal tenderness. There is no right CVA tenderness, left CVA tenderness, guarding or rebound.     Hernia: No hernia is present.  Genitourinary:    Comments: Exam deferred; denies complaints Musculoskeletal:        General: No swelling, tenderness, deformity or signs of injury. Normal range of motion.     Cervical back: Full passive range of motion without pain, normal range of motion and neck supple. No edema, rigidity or tenderness. No muscular tenderness.     Right lower leg: No edema.     Left  lower leg: No edema.  Lymphadenopathy:     Cervical: No cervical adenopathy.     Right cervical: No superficial, deep or posterior cervical adenopathy.    Left cervical: No superficial, deep or posterior cervical adenopathy.  Skin:    General: Skin is warm and dry.     Capillary Refill: Capillary refill takes less than 2 seconds.     Coloration: Skin is not jaundiced or pale.     Findings: No bruising, erythema, lesion or rash.  Neurological:     General: No focal deficit present.     Mental Status: She is alert and oriented to person, place, and time. Mental status  is at baseline.     GCS: GCS eye subscore is 4. GCS verbal subscore is 5. GCS motor subscore is 6.     Sensory: Sensation is intact. No sensory deficit.     Motor: Weakness present.     Coordination: Coordination is intact. Coordination normal.     Gait: Gait is intact. Gait normal.     Comments: Chronic use of walker given visual loss  Psychiatric:        Attention and Perception: Attention and perception normal.        Mood and Affect: Mood and affect normal.        Speech: Speech normal.        Behavior: Behavior normal. Behavior is cooperative.        Thought Content: Thought content normal.        Cognition and Memory: Cognition and memory normal.        Judgment: Judgment normal.     Last depression screening scores    07/08/2023    3:54 PM 05/16/2023    1:52 PM 04/17/2023    3:57 PM  PHQ 2/9 Scores  PHQ - 2 Score 0 0 0  PHQ- 9 Score  0 0   Last fall risk screening    07/08/2023    3:46 PM  Fall Risk   Falls in the past year? 1  Number falls in past yr: 1  Injury with Fall? 1  Comment wound to left leg  Risk for fall due to : No Fall Risks  Follow up Falls prevention discussed;Education provided   Last Audit-C alcohol use screening    11/24/2023    7:43 PM  Alcohol Use Disorder Test (AUDIT)  1. How often do you have a drink containing alcohol? 0  3. How often do you have six or more drinks on one  occasion? 0   A score of 3 or more in women, and 4 or more in men indicates increased risk for alcohol abuse, EXCEPT if all of the points are from question 1   Results for orders placed or performed in visit on 11/26/23  Comprehensive Metabolic Panel (CMET)  Result Value Ref Range   Glucose 95 70 - 99 mg/dL   BUN 48 (H) 10 - 36 mg/dL   Creatinine, Ser 6.04 (H) 0.57 - 1.00 mg/dL   eGFR 46 (L) >54 UJ/WJX/9.14   BUN/Creatinine Ratio 44 (H) 12 - 28   Sodium 137 134 - 144 mmol/L   Potassium 4.1 3.5 - 5.2 mmol/L   Chloride 96 96 - 106 mmol/L   CO2 26 20 - 29 mmol/L   Calcium 9.4 8.7 - 10.3 mg/dL   Total Protein 6.8 6.0 - 8.5 g/dL   Albumin 3.9 3.6 - 4.6 g/dL   Globulin, Total 2.9 1.5 - 4.5 g/dL   Bilirubin Total 0.7 0.0 - 1.2 mg/dL   Alkaline Phosphatase 70 44 - 121 IU/L   AST 26 0 - 40 IU/L   ALT 20 0 - 32 IU/L  CBC with Differential/Platelet  Result Value Ref Range   WBC 4.6 3.4 - 10.8 x10E3/uL   RBC 3.74 (L) 3.77 - 5.28 x10E6/uL   Hemoglobin 12.2 11.1 - 15.9 g/dL   Hematocrit 78.2 95.6 - 46.6 %   MCV 101 (H) 79 - 97 fL   MCH 32.6 26.6 - 33.0 pg   MCHC 32.2 31.5 - 35.7 g/dL   RDW 21.3 08.6 - 57.8 %   Platelets 206 150 - 450  x10E3/uL   Neutrophils 63 Not Estab. %   Lymphs 24 Not Estab. %   Monocytes 11 Not Estab. %   Eos 2 Not Estab. %   Basos 0 Not Estab. %   Neutrophils Absolute 2.9 1.4 - 7.0 x10E3/uL   Lymphocytes Absolute 1.1 0.7 - 3.1 x10E3/uL   Monocytes Absolute 0.5 0.1 - 0.9 x10E3/uL   EOS (ABSOLUTE) 0.1 0.0 - 0.4 x10E3/uL   Basophils Absolute 0.0 0.0 - 0.2 x10E3/uL   Immature Granulocytes 0 Not Estab. %   Immature Grans (Abs) 0.0 0.0 - 0.1 x10E3/uL  Hemoglobin A1c  Result Value Ref Range   Hgb A1c MFr Bld 6.1 (H) 4.8 - 5.6 %   Est. average glucose Bld gHb Est-mCnc 128 mg/dL  Urinalysis, Routine w reflex microscopic  Result Value Ref Range   Specific Gravity, UA 1.011 1.005 - 1.030   pH, UA 7.0 5.0 - 7.5   Color, UA Yellow Yellow   Appearance Ur Clear Clear    Leukocytes,UA Negative Negative   Protein,UA Negative Negative/Trace   Glucose, UA Negative Negative   Ketones, UA Negative Negative   RBC, UA Negative Negative   Bilirubin, UA Negative Negative   Urobilinogen, Ur 0.2 0.2 - 1.0 mg/dL   Nitrite, UA Negative Negative   Microscopic Examination Comment   Lipid panel  Result Value Ref Range   Cholesterol, Total 140 100 - 199 mg/dL   Triglycerides 52 0 - 149 mg/dL   HDL 77 >32 mg/dL   VLDL Cholesterol Cal 11 5 - 40 mg/dL   LDL Chol Calc (NIH) 52 0 - 99 mg/dL   Chol/HDL Ratio 1.8 0.0 - 4.4 ratio  TSH  Result Value Ref Range   TSH 5.260 (H) 0.450 - 4.500 uIU/mL  Iron, TIBC and Ferritin Panel  Result Value Ref Range   Total Iron Binding Capacity 326 250 - 450 ug/dL   UIBC 440 102 - 725 ug/dL   Iron 78 27 - 366 ug/dL   Iron Saturation 24 15 - 55 %   Ferritin 63 15 - 150 ng/mL    Assessment & Plan    Routine Health Maintenance and Physical Exam  Exercise Activities and Dietary recommendations  Goals      DIET - EAT MORE FRUITS AND VEGETABLES        There is no immunization history for the selected administration types on file for this patient.  Health Maintenance  Topic Date Due   COVID-19 Vaccine (1) Never done   Pneumonia Vaccine 83+ Years old (1 of 2 - PCV) Never done   DTaP/Tdap/Td (1 - Tdap) Never done   Zoster Vaccines- Shingrix (1 of 2) Never done   DEXA SCAN  Never done   INFLUENZA VACCINE  03/08/2024 (Originally 07/10/2023)   Medicare Annual Wellness (AWV)  07/07/2024   HPV VACCINES  Aged Out    Discussed health benefits of physical activity, and encouraged her to engage in regular exercise appropriate for her age and condition.  Problem List Items Addressed This Visit       Cardiovascular and Mediastinum   Acute on chronic combined systolic and diastolic CHF (congestive heart failure) (HCC)   Chronic, stable Continue to monitor F/b heart failure group and cards Doing well- minor mix up with one  medication; since corrected will f/u on BMP/eGFR to assist       Relevant Medications   amLODipine (NORVASC) 5 MG tablet   atorvastatin (LIPITOR) 10 MG tablet   losartan (COZAAR) 50  MG tablet   Paroxysmal atrial fibrillation (HCC)   Chronic, stable PPM in place Remains on BB- toprol at 200 mg Not on AC at this time      Relevant Medications   amLODipine (NORVASC) 5 MG tablet   atorvastatin (LIPITOR) 10 MG tablet   losartan (COZAAR) 50 MG tablet   Primary hypertension   Chronic, borderline Defer extensive changes d/t age, vision acuity and use of walker Continue norvasc 5 mg, jardiance 10 mg, losartan 50 mg BID, toprol 200 and prn torsemide 20 with kclor tabs if needed      Relevant Medications   amLODipine (NORVASC) 5 MG tablet   atorvastatin (LIPITOR) 10 MG tablet   losartan (COZAAR) 50 MG tablet     Respiratory   Acute bacterial bronchitis   Acute, failed conservative OTC treatment Recommend use of ABX and inhaled steroid to assist given know aflutter Return to clinic if needed      Relevant Medications   albuterol (VENTOLIN HFA) 108 (90 Base) MCG/ACT inhaler   Spacer/Aero-Holding Chambers (OPTICHAMBER DIAMOND) MISC     Other   Annual physical exam - Primary   Things to do to keep yourself healthy  - Exercise at least 30-45 minutes a day, 3-4 days a week.  - Eat a low-fat diet with lots of fruits and vegetables, up to 7-9 servings per day.  - Seatbelts can save your life. Wear them always.  - Smoke detectors on every level of your home, check batteries every year.  - Eye Doctor - have an eye exam every 1-2 years  - Safe sex - if you may be exposed to STDs, use a condom.  - Alcohol -  If you drink, do it moderately, less than 2 drinks per day.  - Health Care Power of Attorney. Choose someone to speak for you if you are not able.  - Depression is common in our stressful world.If you're feeling down or losing interest in things you normally enjoy, please come in for  a visit.  - Violence - If anyone is threatening or hurting you, please call immediately.       Impaired glucose regulation   Previously noted on labs; recommend A1c Continue to recommend balanced, lower carb meals. Smaller meal size, adding snacks. Choosing water as drink of choice and increasing purposeful exercise.       Relevant Orders   Comprehensive Metabolic Panel (CMET) (Completed)   CBC with Differential/Platelet (Completed)   Hemoglobin A1c (Completed)   Urinalysis, Routine w reflex microscopic (Completed)   Lipid panel (Completed)   TSH (Completed)   Iron, TIBC and Ferritin Panel (Completed)   Pacemaker   No follow-ups on file.    Leilani Merl, FNP, have reviewed all documentation for this visit. The documentation on 12/05/23 for the exam, diagnosis, procedures, and orders are all accurate and complete.  [name], FNP  [facility] [phone] (phone) [phone] (fax)  [facility]

## 2023-12-05 DIAGNOSIS — B9689 Other specified bacterial agents as the cause of diseases classified elsewhere: Secondary | ICD-10-CM | POA: Insufficient documentation

## 2023-12-05 DIAGNOSIS — R7309 Other abnormal glucose: Secondary | ICD-10-CM | POA: Insufficient documentation

## 2023-12-05 NOTE — Patient Instructions (Signed)
The CDC recommends two doses of Shingrix (the new shingles vaccine) separated by 2 to 6 months for adults age 87 years and older. I recommend checking with your insurance plan regarding coverage for this vaccine.    Recommend use of Pneumonia vaccine as well.  Consider DEXA/bone health screening  Obtain TDAP vaccine if cut or puncture occurs

## 2023-12-05 NOTE — Assessment & Plan Note (Signed)
Chronic, borderline Defer extensive changes d/t age, vision acuity and use of walker Continue norvasc 5 mg, jardiance 10 mg, losartan 50 mg BID, toprol 200 and prn torsemide 20 with kclor tabs if needed

## 2023-12-05 NOTE — Assessment & Plan Note (Signed)
Previously noted on labs; recommend A1c Continue to recommend balanced, lower carb meals. Smaller meal size, adding snacks. Choosing water as drink of choice and increasing purposeful exercise.

## 2023-12-05 NOTE — Assessment & Plan Note (Signed)

## 2023-12-05 NOTE — Assessment & Plan Note (Signed)
Chronic, stable Continue to monitor F/b heart failure group and cards Doing well- minor mix up with one medication; since corrected will f/u on BMP/eGFR to assist

## 2023-12-05 NOTE — Assessment & Plan Note (Signed)
Chronic, stable PPM in place Remains on BB- toprol at 200 mg Not on [facility] at this time

## 2023-12-05 NOTE — Assessment & Plan Note (Signed)
Acute, failed conservative OTC treatment Recommend use of ABX and inhaled steroid to assist given know aflutter Return to clinic if needed

## 2023-12-15 ENCOUNTER — Ambulatory Visit (INDEPENDENT_AMBULATORY_CARE_PROVIDER_SITE_OTHER): Payer: Medicare PPO

## 2023-12-15 DIAGNOSIS — I4821 Permanent atrial fibrillation: Secondary | ICD-10-CM | POA: Diagnosis not present

## 2023-12-19 LAB — CUP PACEART REMOTE DEVICE CHECK
Battery Impedance: 2834 Ohm
Battery Remaining Longevity: 26 mo
Battery Voltage: 2.75 V
Brady Statistic RV Percent Paced: 94 %
Date Time Interrogation Session: 20250106154815
Implantable Lead Connection Status: 753985
Implantable Lead Connection Status: 753985
Implantable Lead Implant Date: 20070523
Implantable Lead Implant Date: 20070523
Implantable Lead Location: 753862
Implantable Lead Location: 753862
Implantable Lead Model: 4092
Implantable Lead Model: 5076
Implantable Pulse Generator Implant Date: 20151028
Lead Channel Impedance Value: 660 Ohm
Lead Channel Impedance Value: 67 Ohm
Lead Channel Pacing Threshold Amplitude: 0.625 V
Lead Channel Pacing Threshold Pulse Width: 0.4 ms
Lead Channel Setting Pacing Amplitude: 2 V
Lead Channel Setting Pacing Pulse Width: 0.4 ms
Lead Channel Setting Sensing Sensitivity: 4 mV
Zone Setting Status: 755011
Zone Setting Status: 755011

## 2023-12-23 DIAGNOSIS — H903 Sensorineural hearing loss, bilateral: Secondary | ICD-10-CM | POA: Diagnosis not present

## 2024-01-06 ENCOUNTER — Encounter: Payer: Medicare PPO | Admitting: Family

## 2024-01-11 ENCOUNTER — Telehealth: Payer: Medicare PPO | Admitting: Family Medicine

## 2024-01-11 DIAGNOSIS — J208 Acute bronchitis due to other specified organisms: Secondary | ICD-10-CM | POA: Diagnosis not present

## 2024-01-11 DIAGNOSIS — B9689 Other specified bacterial agents as the cause of diseases classified elsewhere: Secondary | ICD-10-CM

## 2024-01-11 MED ORDER — AZITHROMYCIN 250 MG PO TABS: ORAL_TABLET | ORAL | 0 refills | Status: AC

## 2024-01-11 MED ORDER — PREDNISONE 5 MG PO TABS: 5.0000 mg | ORAL_TABLET | Freq: Every day | ORAL | 0 refills | Status: AC

## 2024-01-11 NOTE — Progress Notes (Signed)
Virtual Visit Consent   [name], you are scheduled for a virtual visit with a [facility] provider today. Just as with appointments in the office, your consent must be obtained to participate. Your consent will be active for this visit and any virtual visit you may have with one of our providers in the next 365 days. If you have a MyChart account, a copy of this consent can be sent to you electronically.  As this is a virtual visit, video technology does not allow for your provider to perform a traditional examination. This may limit your provider's ability to fully assess your condition. If your provider identifies any concerns that need to be evaluated in person or the need to arrange testing (such as labs, EKG, etc.), we will make arrangements to do so. Although advances in technology are sophisticated, we cannot ensure that it will always work on either your end or our end. If the connection with a video visit is poor, the visit may have to be switched to a telephone visit. With either a video or telephone visit, we are not always able to ensure that we have a secure connection.  By engaging in this virtual visit, you consent to the provision of healthcare and authorize for your insurance to be billed (if applicable) for the services provided during this visit. Depending on your insurance coverage, you may receive a charge related to this service.  I need to obtain your verbal consent now. Are you willing to proceed with your visit today? [name] has provided verbal consent on 01/11/2024 for a virtual visit (video or telephone). Georgana Curio, FNP  Date: 01/11/2024 10:07 AM  Virtual Visit via Video Note   I, Georgana Curio, connected with  [name]  ([id number], 05/13/1925) on 01/11/24 at 10:00 AM EST by a video-enabled telemedicine application and verified that I am speaking with the correct person using two identifiers.  Location: Patient: Virtual Visit Location Patient: Home Provider:  Virtual Visit Location Provider: Home Office   I discussed the limitations of evaluation and management by telemedicine and the availability of in person appointments. The patient expressed understanding and agreed to proceed.    History of Present Illness: [name] is a 89 y.o. who identifies as a female who was assigned female at birth, and is being seen today for cough and wheezing head congestion, for 4-5 days worsening. Her daughter is on the phone with her. No fever. She is in no distress. Marland Kitchen  HPI: HPI  Problems:  Patient Active Problem List   Diagnosis Date Noted   Impaired glucose regulation 12/05/2023   Acute bacterial bronchitis 12/05/2023   Annual physical exam 09/26/2022   Primary hypertension 03/27/2022   Paroxysmal atrial fibrillation (HCC) 02/05/2022   Acute on chronic combined systolic and diastolic CHF (congestive heart failure) (HCC) 02/05/2022   Pacemaker 2007    Allergies:  Allergies  Allergen Reactions   Moxifloxacin Itching and Swelling    Swelling and itching of the eyes.   Tobramycin Itching and Swelling    Swelling and itching of the eyelids.   Codeine     Other reaction(s): Hallucination   Medications:  Current Outpatient Medications:    azithromycin (ZITHROMAX) 250 MG tablet, Take 2 tablets on day 1, then 1 tablet daily on days 2 through 5, Disp: 6 tablet, Rfl: 0   predniSONE (DELTASONE) 5 MG tablet, Take 1 tablet (5 mg total) by mouth daily with breakfast for 6 days. Prednisone 5 mg- 6 day  dose pack as directed- #1 with no refills., Disp: 1 tablet, Rfl: 0   albuterol (VENTOLIN HFA) 108 (90 Base) MCG/ACT inhaler, Inhale 2 puffs into the lungs every 6 (six) hours as needed for wheezing or shortness of breath., Disp: 17 each, Rfl: 2   amLODipine (NORVASC) 5 MG tablet, Take 1 tablet (5 mg total) by mouth daily., Disp: 90 tablet, Rfl: 0   atorvastatin (LIPITOR) 10 MG tablet, Take 1 tablet (10 mg total) by mouth daily., Disp: 90 tablet, Rfl: 0   calcium  carbonate (OS-CAL - DOSED IN MG OF ELEMENTAL CALCIUM) 1250 (500 Ca) MG tablet, Take 1 tablet by mouth 2 (two) times daily with a meal., Disp: , Rfl:    dipyridamole-aspirin (AGGRENOX) 200-25 MG 12hr capsule, TAKE 1 CAPSULE BY MOUTH TWICE A DAY, Disp: 180 capsule, Rfl: 0   JARDIANCE 10 MG TABS tablet, TAKE 1 TABLET (10 MG TOTAL) BY MOUTH DAILY. NEEDS APPT FOR FURTHER REFILLS, Disp: 90 tablet, Rfl: 2   loratadine (CLARITIN) 10 MG tablet, Take 10 mg by mouth daily., Disp: , Rfl:    losartan (COZAAR) 50 MG tablet, Take 1 tablet (50 mg total) by mouth 2 (two) times daily., Disp: 180 tablet, Rfl: 1   metolazone (ZAROXOLYN) 2.5 MG tablet, Take 2.5 mg by mouth daily as needed., Disp: , Rfl:    metoprolol (TOPROL-XL) 200 MG 24 hr tablet, Take 1 tablet (200 mg total) by mouth daily., Disp: 90 tablet, Rfl: 0   Multiple Vitamins-Minerals (PRESERVISION AREDS 2 PO), Take 1 tablet by mouth in the morning and at bedtime., Disp: , Rfl:    multivitamin-iron-minerals-folic acid (CENTRUM) chewable tablet, Chew 1 tablet by mouth daily., Disp: , Rfl:    Omega-3 Fatty Acids (FISH OIL) 1000 MG CAPS, Take 1 capsule by mouth daily., Disp: , Rfl:    potassium chloride (KLOR-CON) 10 MEQ tablet, Take 1 tablet (10 mEq total) by mouth as needed. Only if she takes an additional dose of lasix, Disp: 90 tablet, Rfl: 0   Spacer/Aero-Holding Chambers (OPTICHAMBER DIAMOND) MISC, For use with PRN inhaler, Disp: 1 each, Rfl: 0   torsemide (DEMADEX) 20 MG tablet, Take 2 tablets (40 mg total) by mouth daily. Take an additional 20 mg as needed based on daily weights., Disp: 200 tablet, Rfl: 0  Observations/Objective: Patient is well-developed, well-nourished in no acute distress.  Resting comfortably  at home.  Head is normocephalic, atraumatic.  No labored breathing.  Speech is clear and coherent with logical content.  Patient is alert and oriented at baseline.    Assessment and Plan: 1. Acute bacterial bronchitis  (Primary)  Increase fluids, humidifier at night, UC if sx persist or worsen.   Follow Up Instructions: I discussed the assessment and treatment plan with the patient. The patient was provided an opportunity to ask questions and all were answered. The patient agreed with the plan and demonstrated an understanding of the instructions.  A copy of instructions were sent to the patient via MyChart unless otherwise noted below.     The patient was advised to call back or seek an in-person evaluation if the symptoms worsen or if the condition fails to improve as anticipated.    Georgana Curio, FNP

## 2024-01-12 ENCOUNTER — Ambulatory Visit: Payer: Self-pay

## 2024-01-12 NOTE — Telephone Encounter (Signed)
Copied from CRM [id number]. Topic: General - Other >> Jan 12, 2024  1:34 PM Dondra Prader E wrote: Reason for CRM: Pt's daughter called back following up on call with nurse triage regarding prednisone questions.

## 2024-01-12 NOTE — Telephone Encounter (Signed)
  Chief Complaint: Medication clarification Symptoms:  Frequency: today Pertinent Negatives: Patient denies = Disposition: [] ED /[] Urgent Care (no appt availability in office) / [] Appointment(In office/virtual)/ []  [facility]/ [] Home Care/ [] Refused Recommended Disposition /[] [facility]/ [x]  Follow-up with PCP Additional Notes: Returned call to pt's daughter [name] regarding prednisone dosing. Sig from video visit.   predniSONE predniSONE (DELTASONE) 5 MG tablet Take 1 tablet (5 mg total) by mouth daily with breakfast for 6 days. Prednisone 5 mg- 6 day dose pack as directed- #1 with no refills., Starting Sun 01/11/2024, Until Sat 01/17/2024, Normal  Dispense: 1 tablet  Refills: 0 ordered  [id number], [place] - [address] (Ph: [phone])  Order Details Ordered on: 01/11/24  End date: 01/17/24  Authorizing provider: Delorse Lek, FNP       Per daughter sig on packaging has pt taking a decreasing dose. 6-5-4-3-2-1. Daughter has also given pt inhaler treatment and wants to make sure this is ok.   I am sending this encounter to both PCP and Video visit provider. I have also sent a note to MyChart VV provider via [employer].   Message sent:  Hello [name]. I was just speaking to the daughter of  a pt you saw yesterday. [name]. MRN: [id number]. We need to clarify her pred sig. Your sig states one 5mg  tablet daily for 6 days.  The packaging has her taking a decreasing dose - 6,5,4,3,2,1. Please advise. Daughter has also given pt an inhaler treatment and wants to make sure this is ok. Please advise. I will also send this note to pt's PCP.   Daughter would like a call back regarding proper dosing.   Summary: Medication questions   Pt called for medication directions for prednisone, has questions about when to take it.       Sig: Take 1 tablet (5 mg total) by mouth daily with breakfast for 6 days. Prednisone 5 mg- 6  day dose pack as directed- #1 with no refills.  Reason for Disposition  [1] Caller has URGENT medicine question about med that PCP or specialist prescribed AND [2] triager unable to answer question  Answer Assessment - Initial Assessment Questions 1. NAME of MEDICINE: "What medicine(s) are you calling about?"     Prednisone and an inhaler use 2. QUESTION: "What is your question?" (e.g., double dose of medicine, side effect)     Pt and daughter need clarification regarding prednisone instructions 3. PRESCRIBER: "Who prescribed the medicine?" Reason: if prescribed by specialist, call should be referred to that group.     Telehealth NP Diane Carlena Sax  Protocols used: Medication Question Call-A-AH

## 2024-01-12 NOTE — Telephone Encounter (Signed)
Reviewed note from encounter on 01/11/24 with [name]  Plan recommend azithromycin 500mg  day one followed by 250mg  days 2-5 along with prednisone 5mg  6-day dose pack as instructed on the medication packaging so it is correct to take the decreasing dose.   The inhaler is ok to use with the medications

## 2024-01-13 NOTE — Telephone Encounter (Signed)
Kathryn notified.

## 2024-01-16 ENCOUNTER — Encounter: Payer: Self-pay | Admitting: Family Medicine

## 2024-01-16 ENCOUNTER — Ambulatory Visit: Payer: Medicare PPO | Admitting: Family Medicine

## 2024-01-16 ENCOUNTER — Telehealth: Payer: Self-pay | Admitting: Family

## 2024-01-16 VITALS — BP 126/65 | HR 70 | Wt 111.3 lb

## 2024-01-16 DIAGNOSIS — J069 Acute upper respiratory infection, unspecified: Secondary | ICD-10-CM | POA: Diagnosis not present

## 2024-01-16 DIAGNOSIS — R058 Other specified cough: Secondary | ICD-10-CM | POA: Diagnosis not present

## 2024-01-16 NOTE — Telephone Encounter (Signed)
 R/s pt to 01/27/24 from missed appt on 01/08/24

## 2024-01-16 NOTE — Progress Notes (Signed)
      Acute visit   Patient: [name]   DOB: June 21, 1925   89 y.o. Female  MRN: [id number]  Chief Complaint  Patient presents with   Cough    Cough with greenish phlegm X 1 week and 2 days. Patient reports completing azithromycin  (started Sunday) prednisone  (Started Monday) and still taking mucus extended release. Appetite is back, sleeping better, medicine is allowing her to have a good night sleep but cough is still lingering. Daughter would like to have chest and lungs chest, patient is still going to daycare center. SOB after a few steps now when trying to walk around house for 30 min for exercise w/wheezing and disorientation.    Subjective    Discussed the use of AI scribe software for clinical note transcription with the patient, who gave verbal consent to proceed.  History of Present Illness   The patient, with a history of congestive heart failure, presents with a cold or similar illness that has persisted for about nine days. The patient has been taking azithromycin  and prednisone , which has led to some improvement in symptoms. However, the patient still experiences good and bad days, with some nights being particularly difficult. The patient reports bringing up mucus and occasionally experiencing difficulty breathing, particularly with exertion. The patient's weight is stable and within the expected range for her fluid status. The patient is also taking torsemide  for heart failure.       Review of Systems  Objective    BP 126/65 (BP Location: Left Arm, Patient Position: Sitting, Cuff Size: Normal)   Pulse 70   Wt 111 lb 4.8 oz (50.5 kg)   LMP  (LMP Unknown)   SpO2 97%   BMI 22.48 kg/m   Physical Exam    No results found for any visits on 01/16/24.  Assessment & Plan     Problem List Items Addressed This Visit   None Visit Diagnoses       Upper respiratory tract infection, unspecified type    -  Primary     Post-viral cough syndrome               Upper  Respiratory Infection Symptoms for nine days, treated with azithromycin  and prednisone . Persistent intermittent symptoms include mucus production and occasional exertional dyspnea. No recent fevers. Infection improving but residual symptoms persist. Additional antibiotics and prednisone  not recommended due to potential fluid retention and lack of significant benefit. Post-viral coughs can linger for weeks. - Continue generic Mucinex  for mucus clearance - Use honey for cough relief - Monitor symptoms and return if condition worsens  Post-Viral Cough Lingering cough likely due to post-viral inflammation, expected to persist for weeks. Wet lung sounds should improve in five days. - Continue honey for cough relief - Discontinue Mucinex  once mucus production decreases  Congestive Heart Failure On torsemide , weight around dry weight (112 lbs). Slight fluid in lower lungs, possibly due to heart failure or residual inflammation from URI. No significant fluid retention. Additional prednisone  avoided to prevent fluid retention. - Continue torsemide  as prescribed - Monitor daily weight to ensure stable fluid status - Avoid additional prednisone  to prevent fluid retention if possible        No orders of the defined types were placed in this encounter.    Return if symptoms worsen or fail to improve.      Jon Eva, MD  [facility] [phone] (phone) [phone] (fax)  [facility]

## 2024-01-19 ENCOUNTER — Other Ambulatory Visit: Payer: Self-pay | Admitting: Family Medicine

## 2024-01-19 NOTE — Telephone Encounter (Signed)
 [facility] for the following medications:   torsemide  (DEMADEX ) 20 MG tablet    Please advise.

## 2024-01-21 MED ORDER — TORSEMIDE 20 MG PO TABS: 40.0000 mg | ORAL_TABLET | Freq: Every day | ORAL | 0 refills | Status: DC

## 2024-01-22 NOTE — Addendum Note (Signed)
Addended by: Geralyn Flash D on: 01/22/2024 11:32 AM   Modules accepted: Orders

## 2024-01-22 NOTE — Progress Notes (Signed)
Remote pacemaker transmission.

## 2024-01-26 ENCOUNTER — Telehealth: Payer: Self-pay | Admitting: Family

## 2024-01-26 NOTE — Telephone Encounter (Signed)
 Pt confirmed appt for 01/27/24

## 2024-01-26 NOTE — Progress Notes (Unsigned)
Advanced Heart Failure Clinic Note   PCP: Merita Norton, FNP (last seen 06/24) Primary Cardiologist: Debbe Odea, MD (last seen 07/24)  Chief Complaint:  HPI:  [name] is a 89 year old female with a history of afib, sinus pauses s/p pacemaker (2007, 2015 [employer]) and chronic heart failure.   Was in the ED on 10/09/22 due to a fall. Was in the ED 04/08/23 due to a mechanical fall.  Echo 02/26/22: EF of 60-65% along with mild LVH, moderately elevated PA pressure, moderate MR, moderate/severe TR, mild/moderate AS and severe LAE.   She presents today for a HF f/u visit with a chief complaint of minimal fatigue with moderate exertion. Chronic in nature although much improved from last visit. She denies shortness of breath, chest pain, cough, palpitations, abdominal distention, pedal edema, dizziness or difficulty sleeping. She says that her appetite is "very good". Overall, she says that she feels quite well today. Has not had to take any metolazone.   Daughter that is present with her says she found out that they were making a medication error earlier this month when patient wasn't feeling well. Patient has macular degeneration so puts an X on the lid of meds that she doesn't take. Somehow the lid got off her macular degeneration bottle and placed on her torsemide bottle. So when she was here earlier this month and wasn't feeling well, it's because she was without her torsemide (because that bottle had the lid with an X) and she was taking 2-3 of her metoprolol (400-600mg  total) because she thought that bottle was her torsemide bottle. Daughter has since corrected this and will look at the labels and not just rely on the lids for future. This error would certainly explain the weight gain, bradycardia and worsening symptoms.   ROS: All systems negative except as listed in HPI, PMH and Problem List.  SH:  Social History   Socioeconomic History   Marital status: Widowed    Spouse  name: Not on file   Number of children: Not on file   Years of education: Not on file   Highest education level: Some college, no degree  Occupational History   Not on file  Tobacco Use   Smoking status: Never   Smokeless tobacco: Never  Substance and Sexual Activity   Alcohol use: Not Currently   Drug use: Never   Sexual activity: Not on file  Other Topics Concern   Not on file  Social History Narrative   Not on file   Social Drivers of Health   Financial Resource Strain: Low Risk  (11/24/2023)   Overall Financial Resource Strain (CARDIA)    Difficulty of Paying Living Expenses: Not hard at all  Food Insecurity: No Food Insecurity (11/24/2023)   Hunger Vital Sign    Worried About Running Out of Food in the Last Year: Never true    Ran Out of Food in the Last Year: Never true  Transportation Needs: No Transportation Needs (11/24/2023)   PRAPARE - Administrator, Civil Service (Medical): No    Lack of Transportation (Non-Medical): No  Physical Activity: Sufficiently Active (11/24/2023)   Exercise Vital Sign    Days of Exercise per Week: 7 days    Minutes of Exercise per Session: 30 min  Stress: No Stress Concern Present (11/24/2023)   [employer] of Occupational Health - Occupational Stress Questionnaire    Feeling of Stress : Not at all  Social Connections: Moderately Isolated (11/24/2023)   Social  Connection and Isolation Panel [NHANES]    Frequency of Communication with Friends and Family: More than three times a week    Frequency of Social Gatherings with Friends and Family: More than three times a week    Attends Religious Services: More than 4 times per year    Active Member of [employer] or Organizations: No    Attends Banker Meetings: Never    Marital Status: Widowed  Intimate Partner Violence: Not At Risk (07/08/2023)   Humiliation, Afraid, Rape, and Kick questionnaire    Fear of Current or Ex-Partner: No    Emotionally Abused: No     Physically Abused: No    Sexually Abused: No    FH:  Family History  Problem Relation Age of Onset   Heart failure Mother    Heart attack Father    Heart attack Daughter     Past Medical History:  Diagnosis Date   Arrhythmia    atrial fibrillation   Congestive heart failure (CHF) (HCC)    Hypertension    Pacemaker 2015    Current Outpatient Medications  Medication Sig Dispense Refill   albuterol (VENTOLIN HFA) 108 (90 Base) MCG/ACT inhaler Inhale 2 puffs into the lungs every 6 (six) hours as needed for wheezing or shortness of breath. 17 each 2   amLODipine (NORVASC) 5 MG tablet Take 1 tablet (5 mg total) by mouth daily. 90 tablet 0   atorvastatin (LIPITOR) 10 MG tablet Take 1 tablet (10 mg total) by mouth daily. 90 tablet 0   calcium carbonate (OS-CAL - DOSED IN MG OF ELEMENTAL CALCIUM) 1250 (500 Ca) MG tablet Take 1 tablet by mouth 2 (two) times daily with a meal.     dipyridamole-aspirin (AGGRENOX) 200-25 MG 12hr capsule TAKE 1 CAPSULE BY MOUTH TWICE A DAY 180 capsule 0   JARDIANCE 10 MG TABS tablet TAKE 1 TABLET (10 MG TOTAL) BY MOUTH DAILY. NEEDS APPT FOR FURTHER REFILLS 90 tablet 2   loratadine (CLARITIN) 10 MG tablet Take 10 mg by mouth daily.     losartan (COZAAR) 50 MG tablet Take 1 tablet (50 mg total) by mouth 2 (two) times daily. 180 tablet 1   metolazone (ZAROXOLYN) 2.5 MG tablet Take 2.5 mg by mouth daily as needed.     metoprolol (TOPROL-XL) 200 MG 24 hr tablet Take 1 tablet (200 mg total) by mouth daily. 90 tablet 0   Multiple Vitamins-Minerals (PRESERVISION AREDS 2 PO) Take 1 tablet by mouth in the morning and at bedtime.     multivitamin-iron-minerals-folic acid (CENTRUM) chewable tablet Chew 1 tablet by mouth daily.     Omega-3 Fatty Acids (FISH OIL) 1000 MG CAPS Take 1 capsule by mouth daily.     potassium chloride (KLOR-CON) 10 MEQ tablet Take 1 tablet (10 mEq total) by mouth as needed. Only if she takes an additional dose of lasix 90 tablet 0    Spacer/Aero-Holding Chambers (OPTICHAMBER DIAMOND) MISC For use with PRN inhaler 1 each 0   torsemide (DEMADEX) 20 MG tablet Take 2 tablets (40 mg total) by mouth daily. Take an additional 20 mg as needed based on daily weights. 200 tablet 0   No current facility-administered medications for this visit.   There were no vitals filed for this visit.  Wt Readings from Last 3 Encounters:  01/16/24 111 lb 4.8 oz (50.5 kg)  11/26/23 115 lb 4.8 oz (52.3 kg)  10/01/23 111 lb (50.3 kg)   Lab Results  Component Value Date  CREATININE 1.09 (H) 12/01/2023   CREATININE 1.23 (H) 09/17/2023   CREATININE 0.95 09/11/2023   PHYSICAL EXAM:  General:  Well appearing. No resp difficulty HEENT: normal Neck: supple. JVP normal No lymphadenopathy or thryomegaly appreciated. Cor: PMI normal. Regular rate, rhythm No rubs, gallops or murmurs. Lungs: clear Abdomen: soft, nontender, nondistended. No hepatosplenomegaly. No bruits or masses.  Extremities: no cyanosis, clubbing, rash, trace pitting edema bilateral lower legs Neuro: alert & oriented x3, cranial nerves grossly intact. Moves all 4 extremities w/o difficulty. Affect pleasant.   ECG: not done   ASSESSMENT & PLAN:  Chronic heart failure with preserved ejection fraction- - suspect due to atrial fibrillation - NYHA II - euvolemic today - weighing daily; reminded to call for an overnight weight gain of > 2 pounds or a weekly weight gain of > 5 pounds - weight down 10 pounds from last visit here 3 weeks ago - Echo 02/26/22: EF of 60-65% along with mild LVH, moderately elevated PA pressure, moderate MR, moderate/severe TR, mild/moderate AS and severe LAE.  - goes to an adult day care each day, reports that she eats low sodium diet there - trying to keep daily fluid intake to 60-64 ounces daily - saw cardiology, Dr. Azucena Cecil 07/24 - continue jardiance 10mg  daily - continue losartan 50mg  BID - continue metoprolol succinate 200mg  daily -  continue torsemide 40mg  daily and extra 20mg  PRN/ potassium if takes extra torsemide - metolazone 2.5mg  PRN but hasn't taken any recently - BNP 09/17/23 was 664.9  Atrial fibrillation - RRR today, has pacemaker - saw EP Lalla Brothers) 06/23 - BMP 09/17/23 reviewed and showed sodium 127, potassium 3.7, creatinine 1.23 & GFR 10  3: Lymphedema- - stage 2 - wears compression socks daily - is quite active and walked into the office - goes to an adult daycare daily - saw wound center Larina Bras) 07/24 - saw PCP Suzie Portela) 06/24  Return in 3 months, sooner if needed.      Delma Freeze, FNP 01/26/24

## 2024-01-27 ENCOUNTER — Ambulatory Visit: Payer: Medicare PPO | Attending: Family | Admitting: Family

## 2024-01-27 ENCOUNTER — Encounter: Payer: Self-pay | Admitting: Family

## 2024-01-27 VITALS — BP 117/57 | HR 81 | Wt 109.6 lb

## 2024-01-27 DIAGNOSIS — I110 Hypertensive heart disease with heart failure: Secondary | ICD-10-CM | POA: Insufficient documentation

## 2024-01-27 DIAGNOSIS — I4891 Unspecified atrial fibrillation: Secondary | ICD-10-CM | POA: Insufficient documentation

## 2024-01-27 DIAGNOSIS — I4821 Permanent atrial fibrillation: Secondary | ICD-10-CM | POA: Diagnosis not present

## 2024-01-27 DIAGNOSIS — I5032 Chronic diastolic (congestive) heart failure: Secondary | ICD-10-CM | POA: Diagnosis not present

## 2024-01-27 DIAGNOSIS — Z950 Presence of cardiac pacemaker: Secondary | ICD-10-CM | POA: Diagnosis not present

## 2024-01-27 DIAGNOSIS — H3530 Unspecified macular degeneration: Secondary | ICD-10-CM | POA: Diagnosis not present

## 2024-01-27 DIAGNOSIS — I890 Lymphedema, not elsewhere classified: Secondary | ICD-10-CM | POA: Diagnosis not present

## 2024-01-27 DIAGNOSIS — R0602 Shortness of breath: Secondary | ICD-10-CM | POA: Diagnosis present

## 2024-01-27 DIAGNOSIS — Z79899 Other long term (current) drug therapy: Secondary | ICD-10-CM | POA: Insufficient documentation

## 2024-01-27 NOTE — Patient Instructions (Signed)
It was nice to see you today!

## 2024-02-17 ENCOUNTER — Telehealth: Payer: Self-pay | Admitting: Family Medicine

## 2024-02-17 NOTE — Telephone Encounter (Signed)
 [facility] ASPIRIN - DIPYRIDAM ER 25-200 MG Please advise

## 2024-02-18 MED ORDER — ASPIRIN-DIPYRIDAMOLE ER 25-200 MG PO CP12: 1.0000 | ORAL_CAPSULE | Freq: Two times a day (BID) | ORAL | 0 refills | Status: DC

## 2024-02-18 NOTE — Telephone Encounter (Signed)
 LOV 12*18*24 NOV 4*1*25 LRF [id number] LABS [id number]

## 2024-02-25 ENCOUNTER — Ambulatory Visit: Payer: Self-pay | Admitting: Family Medicine

## 2024-03-09 ENCOUNTER — Ambulatory Visit: Payer: Medicare PPO | Admitting: Family Medicine

## 2024-03-09 ENCOUNTER — Encounter: Payer: Self-pay | Admitting: Family Medicine

## 2024-03-09 VITALS — BP 115/59 | HR 75 | Resp 16 | Wt 116.8 lb

## 2024-03-09 DIAGNOSIS — Z950 Presence of cardiac pacemaker: Secondary | ICD-10-CM | POA: Diagnosis not present

## 2024-03-09 DIAGNOSIS — Z789 Other specified health status: Secondary | ICD-10-CM

## 2024-03-09 DIAGNOSIS — I480 Paroxysmal atrial fibrillation: Secondary | ICD-10-CM

## 2024-03-09 DIAGNOSIS — R600 Localized edema: Secondary | ICD-10-CM

## 2024-03-09 DIAGNOSIS — Z7409 Other reduced mobility: Secondary | ICD-10-CM

## 2024-03-09 DIAGNOSIS — I5043 Acute on chronic combined systolic (congestive) and diastolic (congestive) heart failure: Secondary | ICD-10-CM

## 2024-03-09 NOTE — Progress Notes (Signed)
 Established patient visit   Patient: [name]   DOB: 01-Apr-1925   89 y.o. Female  MRN: [id number] Visit Date: 03/09/2024  Today's healthcare provider: Ronnald Ramp, MD   Chief Complaint  Patient presents with   Medical Management of Chronic Issues    HTN   Subjective     HPI     Medical Management of Chronic Issues    Additional comments: HTN      Last edited by Marjie Skiff, CMA on 03/09/2024  3:36 PM.       Discussed the use of AI scribe software for clinical note transcription with the patient, who gave verbal consent to proceed.  History of Present Illness [name] is a 89 year old female with atrial fibrillation, congestive heart failure, and hypertension who presents with leg swelling and discomfort.  She has been experiencing swelling and soreness in her legs, particularly the right leg, which is more swollen than the left. The swelling is tender to touch and has been present for at least a week. It worsens with certain socks and is sensitive to new shoes. She has been taking extra torsemide and potassium daily for the past week due to increased weight and swelling, but her weight remains elevated despite no changes in diet. The swelling makes it difficult for her to climb steps and has impacted her comfort and mobility.  She experienced a fall in the bathroom, which resulted in discomfort and required extra strength Tylenol for about a week. She has been seeing a chiropractor for back pain related to the fall, which has improved her condition. She continues to take extra strength Tylenol two to three times a day for about a month. The back pain has limited her ability to walk for her usual thirty minutes daily, and she has not been able to walk routinely for a month.  Her current medications include losartan 50 mg daily, Claritin 2 mg daily, metoprolol 2.5 mg and 200 mg daily, potassium, torsemide 40 mg as needed for swelling, and albuterol as  needed. She has a history of atrial fibrillation, congestive heart failure, and hypertension, and she has a pacemaker in place. She experiences dry mouth, especially after taking extra torsemide, which makes it difficult to speak until she drinks liquid. She has not taken any long trips recently, with the longest ride being about an hour.  She resides in a daycare center Monday through Friday and reports being tired at the end of the day. She bathes from a basin daily and uses a shower chair and grab bar for weekly baths, but has difficulty getting into the tub due to leg swelling and her height.     Past Medical History:  Diagnosis Date   Arrhythmia    atrial fibrillation   Congestive heart failure (CHF) (HCC)    Hypertension    Pacemaker 2015    Medications: Outpatient Medications Prior to Visit  Medication Sig   albuterol (VENTOLIN HFA) 108 (90 Base) MCG/ACT inhaler Inhale 2 puffs into the lungs every 6 (six) hours as needed for wheezing or shortness of breath.   amLODipine (NORVASC) 5 MG tablet Take 1 tablet (5 mg total) by mouth daily.   atorvastatin (LIPITOR) 10 MG tablet Take 1 tablet (10 mg total) by mouth daily.   calcium carbonate (OS-CAL - DOSED IN MG OF ELEMENTAL CALCIUM) 1250 (500 Ca) MG tablet Take 1 tablet by mouth 2 (two) times daily with a meal.   dipyridamole-aspirin (  AGGRENOX) 200-25 MG 12hr capsule Take 1 capsule by mouth 2 (two) times daily.   JARDIANCE 10 MG TABS tablet TAKE 1 TABLET (10 MG TOTAL) BY MOUTH DAILY. NEEDS APPT FOR FURTHER REFILLS   loratadine (CLARITIN) 10 MG tablet Take 10 mg by mouth daily.   losartan (COZAAR) 50 MG tablet Take 1 tablet (50 mg total) by mouth 2 (two) times daily.   metolazone (ZAROXOLYN) 2.5 MG tablet Take 2.5 mg by mouth daily as needed.   metoprolol (TOPROL-XL) 200 MG 24 hr tablet Take 1 tablet (200 mg total) by mouth daily.   Multiple Vitamins-Minerals (PRESERVISION AREDS 2 PO) Take 1 tablet by mouth in the morning and at bedtime.    multivitamin-iron-minerals-folic acid (CENTRUM) chewable tablet Chew 1 tablet by mouth daily.   Omega-3 Fatty Acids (FISH OIL) 1000 MG CAPS Take 1 capsule by mouth daily.   potassium chloride (KLOR-CON) 10 MEQ tablet Take 1 tablet (10 mEq total) by mouth as needed. Only if she takes an additional dose of lasix   Spacer/Aero-Holding Chambers [facility] DIAMOND) MISC For use with PRN inhaler   torsemide (DEMADEX) 20 MG tablet Take 2 tablets (40 mg total) by mouth daily. Take an additional 20 mg as needed based on daily weights.   No facility-administered medications prior to visit.    Review of Systems  Last CBC Lab Results  Component Value Date   WBC 4.6 12/01/2023   HGB 12.2 12/01/2023   HCT 37.9 12/01/2023   MCV 101 (H) 12/01/2023   MCH 32.6 12/01/2023   RDW 13.0 12/01/2023   PLT 206 12/01/2023   Last metabolic panel Lab Results  Component Value Date   GLUCOSE 95 12/01/2023   NA 137 12/01/2023   K 4.1 12/01/2023   CL 96 12/01/2023   CO2 26 12/01/2023   BUN 48 (H) 12/01/2023   CREATININE 1.09 (H) 12/01/2023   EGFR 46 (L) 12/01/2023   CALCIUM 9.4 12/01/2023   PROT 6.8 12/01/2023   ALBUMIN 3.9 12/01/2023   LABGLOB 2.9 12/01/2023   AGRATIO 1.4 09/26/2022   BILITOT 0.7 12/01/2023   ALKPHOS 70 12/01/2023   AST 26 12/01/2023   ALT 20 12/01/2023   ANIONGAP 14 09/17/2023   Last lipids Lab Results  Component Value Date   CHOL 140 12/01/2023   HDL 77 12/01/2023   LDLCALC 52 12/01/2023   TRIG 52 12/01/2023   CHOLHDL 1.8 12/01/2023   Last hemoglobin A1c Lab Results  Component Value Date   HGBA1C 6.1 (H) 12/01/2023   Last thyroid functions Lab Results  Component Value Date   TSH 5.260 (H) 12/01/2023   Last vitamin D No results found for: "25OHVITD2", "25OHVITD3", "VD25OH" Last vitamin B12 and Folate No results found for: "VITAMINB12", "FOLATE"      Objective    BP (!) 115/59 (BP Location: Left Arm, Patient Position: Sitting, Cuff Size: Normal)   Pulse  75   Resp 16   Wt 116 lb 12.8 oz (53 kg)   LMP  (LMP Unknown)   SpO2 97%   BMI 23.59 kg/m   BP Readings from Last 3 Encounters:  03/09/24 (!) 115/59  01/27/24 (!) 117/57  01/16/24 126/65   Wt Readings from Last 3 Encounters:  03/09/24 116 lb 12.8 oz (53 kg)  01/27/24 109 lb 9.6 oz (49.7 kg)  01/16/24 111 lb 4.8 oz (50.5 kg)        Physical Exam  Physical Exam CHEST: Lungs clear to auscultation, no crackles. CARD: RRR  EXTREM: bilateral,  pitting edema, no erythematous, tenderness to palpation    No results found for any visits on 03/09/24.  Assessment & Plan     Problem List Items Addressed This Visit       Cardiovascular and Mediastinum   Paroxysmal atrial fibrillation (HCC)   Relevant Orders   Ambulatory referral to Home Health   Acute on chronic combined systolic and diastolic CHF (congestive heart failure) (HCC)   Relevant Orders   Ambulatory referral to Home Health     Other   Pacemaker   Relevant Orders   Ambulatory referral to Home Health   Other Visit Diagnoses       Leg edema, right    -  Primary   Relevant Orders   US Venous Img Lower Bilateral (DVT)   CMP14+EGFR   Ambulatory referral to Home Health     Limited mobility       Relevant Orders   Ambulatory referral to Home Health     Impaired mobility and ADLs       Relevant Orders   Ambulatory referral to Home Health        Assessment & Plan Unilateral leg swelling Swelling and tenderness in the right leg, with a significant difference in diameter compared to the left, raises concern for deep vein thrombosis (DVT). Persistent swelling despite increased diuretic use and a history of cardiac issues suggest possible edema. An ultrasound is necessary to rule out DVT. - Order ultrasound of both legs with a focus on the right leg to rule out DVT. - Check comprehensive metabolic panel (CMP) to assess kidney function and electrolyte levels, including sodium and potassium. - Instruct to take half  of the metolazone tablet (1.25 mg) once daily on Wednesday and Thursday, along with the regular dose of torsemide, and reassess by Friday. - Hold off on extra torsemide unless needed, to avoid dehydration. - Arrange for follow-up next week to reassess swelling if the DVT ultrasound is negative.  Congestive heart failure Experiencing increased fluid retention and swelling, particularly in the legs, despite extra torsemide and potassium. Concern for potential heart failure exacerbation contributing to symptoms. - Continue current heart failure management with losartan, metoprolol, and torsemide as needed. - Monitor for signs of heart failure exacerbation, such as increased dyspnea or worsening edema.  Back pain Back pain likely exacerbated by a recent fall, managed with extra strength Tylenol and chiropractic care, which has provided some relief. Pain limits mobility, and she is adjusting to a new walker. - Continue chiropractic care as it has been beneficial. - Consider adjusting the height of the walker if needed for comfort and mobility. - Discussed the option of pain medication if the pain becomes unmanageable, but currently managing with Tylenol.  General Health Maintenance Maintaining a heart-healthy diet and a routine of daily walking, although limited by back pain and swelling. Attends a daycare center during the week, contributing to daily activity. Considering home health services for bathing assistance due to difficulty getting into the tub. - Encourage continuation of a heart-healthy diet and daily walking routine as tolerated. - Consider home health services to assist with bathing due to difficulty getting into the tub.     Return in about 1 week (around 03/16/2024) for swelling f/u .         Ronnald Ramp, MD  [facility] [phone] (phone) [phone] (fax)  [facility]

## 2024-03-10 LAB — CMP14+EGFR
ALT: 23 IU/L (ref 0–32)
AST: 28 IU/L (ref 0–40)
Albumin: 3.8 g/dL (ref 3.6–4.6)
Alkaline Phosphatase: 87 IU/L (ref 44–121)
BUN/Creatinine Ratio: 38 — ABNORMAL HIGH (ref 12–28)
BUN: 45 mg/dL — ABNORMAL HIGH (ref 10–36)
Bilirubin Total: 0.4 mg/dL (ref 0.0–1.2)
CO2: 29 mmol/L (ref 20–29)
Calcium: 9.1 mg/dL (ref 8.7–10.3)
Chloride: 95 mmol/L — ABNORMAL LOW (ref 96–106)
Creatinine, Ser: 1.18 mg/dL — ABNORMAL HIGH (ref 0.57–1.00)
Globulin, Total: 2.4 g/dL (ref 1.5–4.5)
Glucose: 120 mg/dL — ABNORMAL HIGH (ref 70–99)
Potassium: 4.8 mmol/L (ref 3.5–5.2)
Sodium: 135 mmol/L (ref 134–144)
Total Protein: 6.2 g/dL (ref 6.0–8.5)
eGFR: 42 mL/min/{1.73_m2} — ABNORMAL LOW (ref >59)

## 2024-03-11 ENCOUNTER — Encounter: Payer: Self-pay | Admitting: Family Medicine

## 2024-03-11 NOTE — Addendum Note (Signed)
 Addended by: Bing Neighbors on: 03/11/2024 05:25 PM   Modules accepted: Orders

## 2024-03-12 ENCOUNTER — Ambulatory Visit
Admission: RE | Admit: 2024-03-12 | Discharge: 2024-03-12 | Disposition: A | Discharge status: Home / Self Care | Source: Ambulatory Visit | Attending: Family Medicine | Admitting: Family Medicine

## 2024-03-12 ENCOUNTER — Other Ambulatory Visit: Payer: Self-pay | Admitting: Family Medicine

## 2024-03-12 DIAGNOSIS — Z789 Other specified health status: Secondary | ICD-10-CM

## 2024-03-12 DIAGNOSIS — Z7409 Other reduced mobility: Secondary | ICD-10-CM

## 2024-03-12 DIAGNOSIS — I480 Paroxysmal atrial fibrillation: Secondary | ICD-10-CM

## 2024-03-12 DIAGNOSIS — Z950 Presence of cardiac pacemaker: Secondary | ICD-10-CM

## 2024-03-12 DIAGNOSIS — R600 Localized edema: Secondary | ICD-10-CM | POA: Insufficient documentation

## 2024-03-12 DIAGNOSIS — I5043 Acute on chronic combined systolic (congestive) and diastolic (congestive) heart failure: Secondary | ICD-10-CM

## 2024-03-12 DIAGNOSIS — I10 Essential (primary) hypertension: Secondary | ICD-10-CM

## 2024-03-12 DIAGNOSIS — Z0000 Encounter for general adult medical examination without abnormal findings: Secondary | ICD-10-CM

## 2024-03-15 ENCOUNTER — Ambulatory Visit (INDEPENDENT_AMBULATORY_CARE_PROVIDER_SITE_OTHER): Payer: Medicare PPO

## 2024-03-15 ENCOUNTER — Encounter: Payer: Self-pay | Admitting: Family Medicine

## 2024-03-15 DIAGNOSIS — I5032 Chronic diastolic (congestive) heart failure: Secondary | ICD-10-CM | POA: Diagnosis not present

## 2024-03-15 DIAGNOSIS — I4821 Permanent atrial fibrillation: Secondary | ICD-10-CM

## 2024-03-16 ENCOUNTER — Encounter: Payer: Self-pay | Admitting: Family Medicine

## 2024-03-16 ENCOUNTER — Ambulatory Visit: Admitting: Family Medicine

## 2024-03-16 VITALS — BP 116/70 | HR 67 | Ht 59.0 in | Wt 115.6 lb

## 2024-03-16 DIAGNOSIS — R630 Anorexia: Secondary | ICD-10-CM | POA: Diagnosis not present

## 2024-03-16 DIAGNOSIS — I10 Essential (primary) hypertension: Secondary | ICD-10-CM | POA: Diagnosis not present

## 2024-03-16 DIAGNOSIS — J209 Acute bronchitis, unspecified: Secondary | ICD-10-CM | POA: Diagnosis not present

## 2024-03-16 DIAGNOSIS — R600 Localized edema: Secondary | ICD-10-CM | POA: Diagnosis not present

## 2024-03-16 MED ORDER — PREDNISONE 20 MG PO TABS: 20.0000 mg | ORAL_TABLET | Freq: Every day | ORAL | 0 refills | Status: AC

## 2024-03-16 MED ORDER — BENZONATATE 100 MG PO CAPS: 100.0000 mg | ORAL_CAPSULE | Freq: Two times a day (BID) | ORAL | 0 refills | Status: DC | PRN

## 2024-03-16 NOTE — Progress Notes (Signed)
 Established patient visit   Patient: [name]   DOB: [birth year]   89 y.o. Female  MRN: [id number] Visit Date: 03/16/2024  Today's healthcare provider: Ronnald Ramp, MD   Chief Complaint  Patient presents with   Follow-up   Edema    Weight went down, no clots in ultra sound, pt reports feeling much better however there is still notable edema   Cough    Onset 1 week, daugher reports less walking, SOB, seasonal allergies, would like to discuss management   Subjective     HPI     Edema    Additional comments: Weight went down, no clots in ultra sound, pt reports feeling much better however there is still notable edema        Cough    Additional comments: Onset 1 week, daugher reports less walking, SOB, seasonal allergies, would like to discuss management      Last edited by Allayne Stack on 03/16/2024  4:07 PM.       Discussed the use of AI scribe software for clinical note transcription with the patient, who gave verbal consent to proceed.  History of Present Illness [name] is a 89 year old female who presents with lower extremity edema.  The lower extremity edema has improved since the last visit, allowing her to navigate stairs more easily. A deep vein thrombosis ultrasound conducted one week ago was negative. She is on an increased dose of torsemide and metolazone, which was recently adjusted. Her blood pressure is well controlled at 116/70 mmHg.  She has developed a cough and shortness of breath. The cough is productive with mucus and accompanied by wheezing described as 'bad wheezing' and 'sometimes velcro.' No fever or chills are present. She notes decreased appetite and increased reflux, feeling like she might vomit and eating about half of her usual intake. Mucinex helps her sleep, but the cough returns in the early morning before her next dose.  She has experienced increased fluid retention affecting her walking, and she has not been able to  walk as she usually does for at least one and a half months. She also had a hurt back that prevented her from using her routine walker. No fever is present, and once the medication is in her system, it calms the cough.     Past Medical History:  Diagnosis Date   Arrhythmia    atrial fibrillation   Congestive heart failure (CHF) (HCC)    Hypertension    Pacemaker 2015    Medications: Outpatient Medications Prior to Visit  Medication Sig   albuterol (VENTOLIN HFA) 108 (90 Base) MCG/ACT inhaler Inhale 2 puffs into the lungs every 6 (six) hours as needed for wheezing or shortness of breath.   amLODipine (NORVASC) 5 MG tablet Take 1 tablet (5 mg total) by mouth daily.   atorvastatin (LIPITOR) 10 MG tablet Take 1 tablet (10 mg total) by mouth daily.   calcium carbonate (OS-CAL - DOSED IN MG OF ELEMENTAL CALCIUM) 1250 (500 Ca) MG tablet Take 1 tablet by mouth 2 (two) times daily with a meal.   dipyridamole-aspirin (AGGRENOX) 200-25 MG 12hr capsule Take 1 capsule by mouth 2 (two) times daily.   JARDIANCE 10 MG TABS tablet TAKE 1 TABLET (10 MG TOTAL) BY MOUTH DAILY. NEEDS APPT FOR FURTHER REFILLS   loratadine (CLARITIN) 10 MG tablet Take 10 mg by mouth daily.   losartan (COZAAR) 50 MG tablet Take 1 tablet (50 mg total) by mouth 2 (  two) times daily.   metolazone (ZAROXOLYN) 2.5 MG tablet Take 2.5 mg by mouth daily as needed.   metoprolol (TOPROL-XL) 200 MG 24 hr tablet Take 1 tablet (200 mg total) by mouth daily.   Multiple Vitamins-Minerals (PRESERVISION AREDS 2 PO) Take 1 tablet by mouth in the morning and at bedtime.   multivitamin-iron-minerals-folic acid (CENTRUM) chewable tablet Chew 1 tablet by mouth daily.   Omega-3 Fatty Acids (FISH OIL) 1000 MG CAPS Take 1 capsule by mouth daily.   potassium chloride (KLOR-CON) 10 MEQ tablet Take 1 tablet (10 mEq total) by mouth as needed. Only if she takes an additional dose of lasix   Spacer/Aero-Holding Chambers [facility] DIAMOND) MISC For use  with PRN inhaler   torsemide (DEMADEX) 20 MG tablet Take 2 tablets (40 mg total) by mouth daily. Take an additional 20 mg as needed based on daily weights.   No facility-administered medications prior to visit.    Review of Systems      Objective    BP 116/70   Pulse 67   Ht 4\' 11"  (1.499 m)   Wt 115 lb 9.6 oz (52.4 kg)   LMP  (LMP Unknown)   SpO2 98%   BMI 23.35 kg/m  BP Readings from Last 3 Encounters:  03/16/24 116/70  03/09/24 (!) 115/59  01/27/24 (!) 117/57   Wt Readings from Last 3 Encounters:  03/16/24 115 lb 9.6 oz (52.4 kg)  03/09/24 116 lb 12.8 oz (53 kg)  01/27/24 109 lb 9.6 oz (49.7 kg)        Physical Exam  Physical Exam VITALS: BP- 116/70 HEENT: No erythema or redness in the throat. CHEST: Wheezing and coarse breath sounds throughout    No results found for any visits on 03/16/24.  Assessment & Plan     Problem List Items Addressed This Visit       Cardiovascular and Mediastinum   Primary hypertension   Relevant Orders   BMP8+EGFR   Other Visit Diagnoses       Leg edema, right    -  Primary   Relevant Orders   BMP8+EGFR     Acute bronchitis, unspecified organism       Relevant Medications   predniSONE (DELTASONE) 20 MG tablet   Other Relevant Orders   DG Chest 2 View     Decreased appetite            Assessment & Plan Cough and Shortness of Breath New onset cough and dyspnea with wheezing and coarse breath sounds, suggestive of bronchitis. Productive cough with mucus, no fever or chills. Similar symptoms in February treated with prednisone. No COPD. Differential includes pulmonary edema or pneumonia. - Prescribe prednisone 20 mg for 5 days - Prescribe Tessalon Perles every 12 hours for cough - Order chest x-ray to evaluate for pulmonary edema or pneumonia - Consider antibiotics if symptoms do not improve after 5 days  Lower Extremity Edema Chronic lower extremity edema with recent negative DVT ultrasound. Edema has improved  but persists. Blood pressure is well-controlled. Amlodipine may contribute to edema. Holding amlodipine to assess impact. - considering holding amlodipine to assess impact on edema if edema persists  - will continue 40mg  torsemide with additional 20mg  as needed for edema or weight gain  - stop metolazone  - Order basic metabolic panel to monitor potassium, sodium, and creatinine levels due to increased dose of torsemide and metolazone    Follow-up Discussed follow-up for ongoing management and symptom evaluation. Advised to obtain chest  x-ray at outpatient imaging center before week's end. Medications sent to pharmacy. Schedule follow-up if symptoms do not improve after 5 days of treatment. - Advise to obtain chest x-ray at outpatient imaging center before week's end - Medications sent to pharmacy - Schedule follow-up if symptoms do not improve after 5 days of treatment     No follow-ups on file.         Ronnald Ramp, MD  [facility] [phone] (phone) [phone] (fax)  [facility]

## 2024-03-16 NOTE — Patient Instructions (Signed)
 Please report to [facility] located at:  [address]  [place], [place] [address]  You do not need an appointment to have xrays completed.   Our office will follow up with  results once available.

## 2024-03-17 ENCOUNTER — Ambulatory Visit
Admission: RE | Admit: 2024-03-17 | Discharge: 2024-03-17 | Disposition: A | Discharge status: Home / Self Care | Source: Ambulatory Visit | Attending: Family Medicine | Admitting: Family Medicine

## 2024-03-17 ENCOUNTER — Ambulatory Visit
Admission: RE | Admit: 2024-03-17 | Discharge: 2024-03-17 | Disposition: A | Discharge status: Home / Self Care | Attending: Family Medicine | Admitting: Family Medicine

## 2024-03-17 DIAGNOSIS — J209 Acute bronchitis, unspecified: Secondary | ICD-10-CM | POA: Diagnosis present

## 2024-03-17 LAB — BMP8+EGFR
BUN/Creatinine Ratio: 50 — ABNORMAL HIGH (ref 12–28)
BUN: 52 mg/dL — ABNORMAL HIGH (ref 10–36)
CO2: 26 mmol/L (ref 20–29)
Calcium: 9.2 mg/dL (ref 8.7–10.3)
Chloride: 94 mmol/L — ABNORMAL LOW (ref 96–106)
Creatinine, Ser: 1.03 mg/dL — ABNORMAL HIGH (ref 0.57–1.00)
Glucose: 141 mg/dL — ABNORMAL HIGH (ref 70–99)
Potassium: 3.9 mmol/L (ref 3.5–5.2)
Sodium: 137 mmol/L (ref 134–144)
eGFR: 50 mL/min/{1.73_m2} — ABNORMAL LOW (ref >59)

## 2024-03-18 ENCOUNTER — Encounter: Payer: Self-pay | Admitting: Family Medicine

## 2024-03-18 NOTE — Progress Notes (Signed)
 Message was Last read by [name] at 1:03PM on 03/18/2024.

## 2024-03-21 LAB — CUP PACEART REMOTE DEVICE CHECK
Battery Impedance: 3037 Ohm
Battery Remaining Longevity: 25 mo
Battery Voltage: 2.74 V
Brady Statistic RV Percent Paced: 94 %
Date Time Interrogation Session: 20250411082218
Implantable Lead Connection Status: 753985
Implantable Lead Connection Status: 753985
Implantable Lead Implant Date: 20070523
Implantable Lead Implant Date: 20070523
Implantable Lead Location: 753862
Implantable Lead Location: 753862
Implantable Lead Model: 4092
Implantable Lead Model: 5076
Implantable Pulse Generator Implant Date: 20151028
Lead Channel Impedance Value: 67 Ohm
Lead Channel Impedance Value: 673 Ohm
Lead Channel Pacing Threshold Amplitude: 0.5 V
Lead Channel Pacing Threshold Pulse Width: 0.4 ms
Lead Channel Setting Pacing Amplitude: 2 V
Lead Channel Setting Pacing Pulse Width: 0.4 ms
Lead Channel Setting Sensing Sensitivity: 2.8 mV
Zone Setting Status: 755011
Zone Setting Status: 755011

## 2024-03-22 ENCOUNTER — Encounter: Payer: Self-pay | Admitting: Family Medicine

## 2024-03-23 ENCOUNTER — Encounter: Payer: Self-pay | Admitting: Cardiology

## 2024-03-26 ENCOUNTER — Telehealth: Payer: Self-pay

## 2024-03-26 DIAGNOSIS — R441 Visual hallucinations: Secondary | ICD-10-CM

## 2024-03-26 DIAGNOSIS — Z950 Presence of cardiac pacemaker: Secondary | ICD-10-CM

## 2024-03-26 DIAGNOSIS — D649 Anemia, unspecified: Secondary | ICD-10-CM

## 2024-03-26 DIAGNOSIS — Z7982 Long term (current) use of aspirin: Secondary | ICD-10-CM

## 2024-03-26 DIAGNOSIS — I110 Hypertensive heart disease with heart failure: Secondary | ICD-10-CM

## 2024-03-26 DIAGNOSIS — I480 Paroxysmal atrial fibrillation: Secondary | ICD-10-CM

## 2024-03-26 DIAGNOSIS — I5043 Acute on chronic combined systolic (congestive) and diastolic (congestive) heart failure: Secondary | ICD-10-CM

## 2024-03-26 DIAGNOSIS — Z7984 Long term (current) use of oral hypoglycemic drugs: Secondary | ICD-10-CM

## 2024-03-26 DIAGNOSIS — Z9181 History of falling: Secondary | ICD-10-CM

## 2024-03-26 DIAGNOSIS — M549 Dorsalgia, unspecified: Secondary | ICD-10-CM | POA: Diagnosis not present

## 2024-03-26 DIAGNOSIS — K219 Gastro-esophageal reflux disease without esophagitis: Secondary | ICD-10-CM

## 2024-03-26 DIAGNOSIS — H3530 Unspecified macular degeneration: Secondary | ICD-10-CM

## 2024-03-26 NOTE — Telephone Encounter (Signed)
Ok for verbal orders.    Andreya Lacks Simmons-Robinson, MD  [facility]  

## 2024-03-26 NOTE — Telephone Encounter (Signed)
 Copied from CRM [id number]. Topic: Clinical - Home Health Verbal Orders >> Mar 26, 2024 11:51 AM Felizardo Hotter wrote: Caller/Agency: [facility] per Denton Flakes Number: [phone] secure line Service Requested: Occupational Therapy Frequency: 1x week 4 week for aid and OT 1xweek every other week for 3 visits.  Any new concerns about the patient? No

## 2024-03-26 NOTE — Telephone Encounter (Signed)
 Attempted to contact [facility] form [facility], wasn't able to leave a message if she calls back per Dr R the request for OT has been approved.

## 2024-04-05 ENCOUNTER — Telehealth: Payer: Self-pay | Admitting: Family Medicine

## 2024-04-05 MED ORDER — ATORVASTATIN CALCIUM 10 MG PO TABS: 10.0000 mg | ORAL_TABLET | Freq: Every day | ORAL | 1 refills | Status: DC

## 2024-04-05 NOTE — Addendum Note (Signed)
 Addended by: Bart Lieu on: 04/05/2024 02:51 PM   Modules accepted: Orders

## 2024-04-05 NOTE — Telephone Encounter (Signed)
 [facility] is requesting refills on Atorvastatin  10 mg. #90 with refills

## 2024-04-19 ENCOUNTER — Other Ambulatory Visit: Payer: Self-pay | Admitting: Family Medicine

## 2024-04-20 NOTE — Telephone Encounter (Signed)
 Labs in date  Requested Prescriptions  Pending Prescriptions Disp Refills   torsemide  (DEMADEX ) 20 MG tablet [Pharmacy Med Name: TORSEMIDE  20 MG TABLET] 200 tablet 0    Sig: TAKE 2 TABLETS (40 MG TOTAL) BY MOUTH DAILY. TAKE AN ADDITIONAL 20 MG AS NEEDED BASED ON DAILY WEIGHTS.     Cardiovascular:  Diuretics - Loop Failed - 04/20/2024  2:53 PM      Failed - Cr in normal range and within 180 days    Creatinine, Ser  Date Value Ref Range Status  03/16/2024 1.03 (H) 0.57 - 1.00 mg/dL Final    Comment:    **Verified by repeat analysis**         Failed - Cl in normal range and within 180 days    Chloride  Date Value Ref Range Status  03/16/2024 94 (L) 96 - 106 mmol/L Final         Failed - Mg Level in normal range and within 180 days    Magnesium  Date Value Ref Range Status  09/11/2023 3.0 (H) 1.7 - 2.4 mg/dL Final    Comment:    Performed at [facility], [address] [place], [place] [address]         Passed - K in normal range and within 180 days    Potassium  Date Value Ref Range Status  03/16/2024 3.9 3.5 - 5.2 mmol/L Final         Passed - Ca in normal range and within 180 days    Calcium   Date Value Ref Range Status  03/16/2024 9.2 8.7 - 10.3 mg/dL Final         Passed - Na in normal range and within 180 days    Sodium  Date Value Ref Range Status  03/16/2024 137 134 - 144 mmol/L Final         Passed - Last BP in normal range    BP Readings from Last 1 Encounters:  03/16/24 116/70         Passed - Valid encounter within last 6 months    Recent Outpatient Visits           1 month ago Leg edema, right   [facility] Simmons-Robinson, [place], MD   1 month ago Leg edema, right   [facility] Simmons-Robinson, [place], MD   3 months ago Upper respiratory tract infection, unspecified type   [facility] [place], Stan Eans, MD

## 2024-04-26 ENCOUNTER — Telehealth: Payer: Self-pay | Admitting: Family Medicine

## 2024-04-26 DIAGNOSIS — I10 Essential (primary) hypertension: Secondary | ICD-10-CM

## 2024-04-26 MED ORDER — AMLODIPINE BESYLATE 5 MG PO TABS: 5.0000 mg | ORAL_TABLET | Freq: Every day | ORAL | 1 refills | Status: DC

## 2024-04-26 NOTE — Telephone Encounter (Signed)
[facility] faxed refill request for the following medications:   amLODipine (NORVASC) 5 MG tablet   Please advise.  

## 2024-04-27 NOTE — Addendum Note (Signed)
 Addended by: Edra Govern D on: 04/27/2024 02:46 PM   Modules accepted: Orders

## 2024-04-27 NOTE — Progress Notes (Signed)
 Remote pacemaker transmission.

## 2024-04-28 ENCOUNTER — Encounter: Payer: Self-pay | Admitting: Family Medicine

## 2024-04-28 ENCOUNTER — Ambulatory Visit: Admitting: Family Medicine

## 2024-04-28 ENCOUNTER — Other Ambulatory Visit: Payer: Self-pay | Admitting: Family Medicine

## 2024-04-28 VITALS — BP 116/59 | HR 82 | Ht 59.0 in | Wt 107.0 lb

## 2024-04-28 DIAGNOSIS — R1311 Dysphagia, oral phase: Secondary | ICD-10-CM | POA: Diagnosis not present

## 2024-04-28 DIAGNOSIS — R634 Abnormal weight loss: Secondary | ICD-10-CM

## 2024-04-28 DIAGNOSIS — K219 Gastro-esophageal reflux disease without esophagitis: Secondary | ICD-10-CM

## 2024-04-28 MED ORDER — PANTOPRAZOLE SODIUM 20 MG PO TBEC: 20.0000 mg | DELAYED_RELEASE_TABLET | Freq: Every day | ORAL | 2 refills | Status: DC

## 2024-04-28 NOTE — Progress Notes (Signed)
 ACUTE VISIT   Patient: [name]   DOB: [birth year]   89 y.o. Female  MRN: [id number]   PCP: Mimi Alt, MD  Chief Complaint  Patient presents with   Gastroesophageal Reflux    For the last couple weeks she has experienced the feeling of her food trying to come back up after eating    Subjective    HPI HPI     Gastroesophageal Reflux    Additional comments: For the last couple weeks she has experienced the feeling of her food trying to come back up after eating       Last edited by Bart Lieu, CMA on 04/28/2024  1:35 PM.       Discussed the use of AI scribe software for clinical note transcription with the patient, who gave verbal consent to proceed.  History of Present Illness [name] is a 89 year old female who presents with increasing gastroesophageal reflux symptoms.  She experiences regurgitation of food into her esophagus after eating, accompanied by a sensation of something being stuck in her throat, described as 'like a rock or a cork'.  There is significant mucus and saliva production after eating and drinking, including when taking medication with water, leading to an urgency to expel the mucus, which she describes as 'like half a Kleenex box'.  No nausea or vomiting of food, with the issue primarily being mucus production. Sipping liquids rather than drinking large amounts at once helps reduce symptoms.  Her weight has decreased by approximately eight pounds over the past month, with a noted steady decline over the past week, despite her normal weight being around 112 pounds. She attributes some of the weight loss to fluid retention issues previously.  She is currently taking diuretics, which necessitate a certain fluid intake throughout the day.  No abdominal pain and the irritation occurs regardless of the type of food consumed.     Medications: Outpatient Medications Prior to Visit  Medication Sig   albuterol  (VENTOLIN   HFA) 108 (90 Base) MCG/ACT inhaler Inhale 2 puffs into the lungs every 6 (six) hours as needed for wheezing or shortness of breath.   amLODipine  (NORVASC ) 5 MG tablet Take 1 tablet (5 mg total) by mouth daily.   atorvastatin  (LIPITOR) 10 MG tablet Take 1 tablet (10 mg total) by mouth daily.   calcium  carbonate (OS-CAL - DOSED IN MG OF ELEMENTAL CALCIUM ) 1250 (500 Ca) MG tablet Take 1 tablet by mouth 2 (two) times daily with a meal.   dipyridamole -aspirin  (AGGRENOX ) 200-25 MG 12hr capsule Take 1 capsule by mouth 2 (two) times daily.   JARDIANCE  10 MG TABS tablet TAKE 1 TABLET (10 MG TOTAL) BY MOUTH DAILY. NEEDS APPT FOR FURTHER REFILLS   loratadine (CLARITIN) 10 MG tablet Take 10 mg by mouth daily.   losartan  (COZAAR ) 50 MG tablet Take 1 tablet (50 mg total) by mouth 2 (two) times daily.   metolazone  (ZAROXOLYN ) 2.5 MG tablet Take 2.5 mg by mouth daily as needed.   metoprolol  (TOPROL -XL) 200 MG 24 hr tablet Take 1 tablet (200 mg total) by mouth daily.   Multiple Vitamins-Minerals (PRESERVISION AREDS 2 PO) Take 1 tablet by mouth in the morning and at bedtime.   multivitamin-iron-minerals-folic acid (CENTRUM) chewable tablet Chew 1 tablet by mouth daily.   Omega-3 Fatty Acids (FISH OIL) 1000 MG CAPS Take 1 capsule by mouth daily.   potassium chloride  (KLOR-CON ) 10 MEQ tablet Take 1 tablet (10 mEq total) by mouth  as needed. Only if she takes an additional dose of lasix    torsemide  (DEMADEX ) 20 MG tablet TAKE 2 TABLETS (40 MG TOTAL) BY MOUTH DAILY. TAKE AN ADDITIONAL 20 MG AS NEEDED BASED ON DAILY WEIGHTS.   benzonatate  (TESSALON ) 100 MG capsule Take 1 capsule (100 mg total) by mouth 2 (two) times daily as needed for cough. (Patient not taking: Reported on 04/28/2024)   Spacer/Aero-Holding Chambers (OPTICHAMBER DIAMOND ) MISC For use with PRN inhaler (Patient not taking: Reported on 04/28/2024)   No facility-administered medications prior to visit.    Last CBC Lab Results  Component Value Date   WBC  4.6 12/01/2023   HGB 12.2 12/01/2023   HCT 37.9 12/01/2023   MCV 101 (H) 12/01/2023   MCH 32.6 12/01/2023   RDW 13.0 12/01/2023   PLT 206 12/01/2023   Last metabolic panel Lab Results  Component Value Date   GLUCOSE 141 (H) 03/16/2024   NA 137 03/16/2024   K 3.9 03/16/2024   CL 94 (L) 03/16/2024   CO2 26 03/16/2024   BUN 52 (H) 03/16/2024   CREATININE 1.03 (H) 03/16/2024   EGFR 50 (L) 03/16/2024   CALCIUM  9.2 03/16/2024   PROT 6.2 03/09/2024   ALBUMIN 3.8 03/09/2024   LABGLOB 2.4 03/09/2024   AGRATIO 1.4 09/26/2022   BILITOT 0.4 03/09/2024   ALKPHOS 87 03/09/2024   AST 28 03/09/2024   ALT 23 03/09/2024   ANIONGAP 14 09/17/2023   Last lipids Lab Results  Component Value Date   CHOL 140 12/01/2023   HDL 77 12/01/2023   LDLCALC 52 12/01/2023   TRIG 52 12/01/2023   CHOLHDL 1.8 12/01/2023   Last hemoglobin A1c Lab Results  Component Value Date   HGBA1C 6.1 (H) 12/01/2023   Last thyroid functions Lab Results  Component Value Date   TSH 5.260 (H) 12/01/2023        Objective    BP (!) 116/59   Pulse 82   Ht 4\' 11"  (1.499 m)   Wt 107 lb (48.5 kg)   LMP  (LMP Unknown)   SpO2 97%   BMI 21.61 kg/m   BP Readings from Last 3 Encounters:  04/28/24 (!) 116/59  03/16/24 116/70  03/09/24 (!) 115/59   Wt Readings from Last 3 Encounters:  04/28/24 107 lb (48.5 kg)  03/16/24 115 lb 9.6 oz (52.4 kg)  03/09/24 116 lb 12.8 oz (53 kg)      Physical Exam HENT:     Head: No right periorbital erythema or left periorbital erythema.     Mouth/Throat:     Lips: Pink. No lesions.     Mouth: Mucous membranes are moist. No oral lesions or angioedema.     Tongue: No lesions.     Palate: No mass and lesions.     Pharynx: Oropharynx is clear. No pharyngeal swelling, oropharyngeal exudate, posterior oropharyngeal erythema or postnasal drip.      Physical Exam MEASUREMENTS: Weight- 112.   No results found for any visits on 04/28/24.  Assessment & Plan       Assessment & Plan Gastroesophageal reflux disease Increasing symptoms with regurgitation, dysphagia, and excessive mucus production. Possible esophageal stricture or allergic reaction causing inflammation and increased secretions. Associated weight loss likely due to dysphagia and decreased intake. Protonix prescribed to manage symptoms pending gastroenterology evaluation. - Prescribe Protonix 20 mg once daily, with option to increase to twice daily if symptoms persist. - Refer to gastroenterology for evaluation and possible esophagogastroduodenoscopy (EGD) to assess for esophageal  stricture, inflammation, or other pathology. - Monitor for nausea and consider antiemetic if symptoms develop.  Unspecified weight loss Weight loss of approximately 5 pounds in one week, totaling 8 pounds since last month, likely related to dysphagia and decreased intake due to gastroesophageal reflux disease symptoms. Emphasized importance of maintaining hydration due to diuretic use. - Ensure adequate hydration despite difficulty swallowing.      No follow-ups on file.        Mimi Alt, MD  [facility] [phone] (phone) [phone] (fax)  [facility]

## 2024-05-04 ENCOUNTER — Other Ambulatory Visit: Payer: Self-pay | Admitting: Family Medicine

## 2024-05-04 NOTE — Telephone Encounter (Signed)
 LOV 5*21*25 NOV 8*5*25 LRF [id number] LABS  [id number]

## 2024-05-04 NOTE — Telephone Encounter (Signed)
 [facility] is requesting refill potassium chloride  (KLOR-CON ) 10 MEQ tablet  Please advise

## 2024-05-05 MED ORDER — POTASSIUM CHLORIDE ER 10 MEQ PO TBCR: 10.0000 meq | EXTENDED_RELEASE_TABLET | ORAL | 1 refills | Status: DC | PRN

## 2024-05-13 ENCOUNTER — Other Ambulatory Visit: Payer: Self-pay

## 2024-05-13 ENCOUNTER — Telehealth: Payer: Self-pay | Admitting: Family Medicine

## 2024-05-13 MED ORDER — METOPROLOL SUCCINATE ER 200 MG PO TB24: 200.0000 mg | ORAL_TABLET | Freq: Every day | ORAL | 0 refills | Status: DC

## 2024-05-13 NOTE — Telephone Encounter (Signed)
 [facility] faxed refill request for the following medications:  metoprolol  (TOPROL -XL) 200 MG 24 hr tablet    Please advise

## 2024-05-16 ENCOUNTER — Other Ambulatory Visit: Payer: Self-pay | Admitting: Family Medicine

## 2024-05-19 ENCOUNTER — Ambulatory Visit: Admitting: Family

## 2024-05-19 ENCOUNTER — Other Ambulatory Visit
Admission: RE | Admit: 2024-05-19 | Discharge: 2024-05-19 | Disposition: A | Discharge status: Home / Self Care | Source: Ambulatory Visit | Attending: Family | Admitting: Family

## 2024-05-19 ENCOUNTER — Ambulatory Visit: Payer: Self-pay

## 2024-05-19 ENCOUNTER — Encounter: Payer: Self-pay | Admitting: Family

## 2024-05-19 VITALS — BP 116/64 | HR 76 | Wt 113.0 lb

## 2024-05-19 DIAGNOSIS — I5032 Chronic diastolic (congestive) heart failure: Secondary | ICD-10-CM | POA: Diagnosis not present

## 2024-05-19 DIAGNOSIS — Z79899 Other long term (current) drug therapy: Secondary | ICD-10-CM | POA: Insufficient documentation

## 2024-05-19 DIAGNOSIS — I890 Lymphedema, not elsewhere classified: Secondary | ICD-10-CM

## 2024-05-19 DIAGNOSIS — I4821 Permanent atrial fibrillation: Secondary | ICD-10-CM | POA: Diagnosis not present

## 2024-05-19 LAB — BASIC METABOLIC PANEL WITH GFR
Anion gap: 11 (ref 5–15)
BUN: 54 mg/dL — ABNORMAL HIGH (ref 8–23)
CO2: 28 mmol/L (ref 22–32)
Calcium: 8.8 mg/dL — ABNORMAL LOW (ref 8.9–10.3)
Chloride: 97 mmol/L — ABNORMAL LOW (ref 98–111)
Creatinine, Ser: 1.1 mg/dL — ABNORMAL HIGH (ref 0.44–1.00)
GFR, Estimated: 46 mL/min — ABNORMAL LOW (ref >60)
Glucose, Bld: 95 mg/dL (ref 70–99)
Potassium: 4.4 mmol/L (ref 3.5–5.1)
Sodium: 136 mmol/L (ref 135–145)

## 2024-05-19 NOTE — Telephone Encounter (Signed)
 Was seen with Brian Campanile her NP at heart failure, pt was advised at urgent care they could not see pt for leg swelling. Daughter called her heart doctor and was seen there was given instructions with cardiology doctor.

## 2024-05-19 NOTE — Telephone Encounter (Signed)
 FYI Only or Action Required?: Action required by provider  Patient was last seen in primary care on 04/28/2024 by Mimi Alt, MD. Called Nurse Triage reporting Leg Swelling. Symptoms began several days ago. Interventions attempted: Rest, hydration, or home remedies. Symptoms are: gradually worsening.  Triage Disposition: See Physician Within 24 Hours  Patient/caregiver understands and will follow disposition?: YesCopied from CRM [id number]. Topic: Clinical - Red Word Triage >> May 19, 2024  1:42 PM [name] wrote: Red Word that prompted transfer to Nurse Triage: swollen right leg with weeping Reason for Disposition  Swelling is painful to touch  Answer Assessment - Initial Assessment Questions 1. ONSET: When did the swelling start? (e.g., minutes, hours, days)     3 days - right leg 2. SEVERITY: How swollen is it?     Starting to weep 3. ITCHING: Is there any itching? If Yes, ask: How much?   (Scale 1-10; mild, moderate or severe)     Not sure 4. PAIN: Is the swelling painful to touch? If Yes, ask: How painful is it?   (Scale 1-10; mild, moderate or severe)     Yes but unsure 5. CAUSE: What do you think is causing the lip swelling?     Not sure   7. OTHER SYMPTOMS: Do you have any other symptoms? (e.g., toothache)     Denies     Pt has had increased swelling in right leg. Leg is wrapped and compressed and is now weeping. Pt is able to walk. There is some pain. Daugher [name] has increased torsemide  and KCL but not seeing any improvement. No office appts until 6/16. RN advised pt go to UC today and follow up with pcp next week.  Protocols used: Lip Swelling-[name]-AH

## 2024-05-19 NOTE — Patient Instructions (Signed)
 Medication Changes:  TAKE METOLAZONE  2.5 MG THURSDAY AND FRIDAY  BE SURE TO TAKE YOUR POTASSIUM WITH THE METOLAZONE   Lab Work:  Go over to the [facility]ALL. Go pass the gift shop and have your blood work completed.  We will only call you if the results are abnormal or if the provider would like to make medication changes.  Follow-Up in: 2 WEEKS TINA Everitt Hoa, FNP.  At the [facility], you and your health needs are our priority. We have a designated team specialized in the treatment of Heart Failure. This Care Team includes your primary Heart Failure Specialized Cardiologist (physician), Advanced Practice Providers (APPs- Physician Assistants and Nurse Practitioners), and Pharmacist who all work together to provide you with the care you need, when you need it.   You may see any of the following providers on your designated Care Team at your next follow up:  Dr. Jules Oar Dr. Peder Bourdon Dr. Alwin Baars Dr. Judyth Nunnery Shawnee Dellen, FNP Bevely Brush, RPH-CPP  Please be sure to bring in all your medications bottles to every appointment.   Need to Contact Us :  If you have any questions or concerns before your next appointment please send us  a message through Faith or call our office at [phone].    TO LEAVE A MESSAGE FOR THE NURSE SELECT OPTION 2, PLEASE LEAVE A MESSAGE INCLUDING: YOUR NAME DATE OF BIRTH CALL BACK NUMBER REASON FOR CALL**this is important as we prioritize the call backs  YOU WILL RECEIVE A CALL BACK THE SAME DAY AS LONG AS YOU CALL BEFORE 4:00 PM

## 2024-05-19 NOTE — Progress Notes (Signed)
 [facility] Note    PCP: Arletha Lady, MD (last seen 05/25) Primary Cardiologist: Constancia Delton, MD (last seen 07/24)  Chief Complaint: shortness of breath   HPI:  [name] is a 89 year old female with a history of afib, sinus pauses s/p pacemaker (2007, 2015 [employer]) and chronic heart failure.   Echo 02/26/22: EF of 60-65% along with mild LVH, moderately elevated PA pressure, moderate MR, moderate/severe TR, mild/moderate AS and severe LAE.   Was in the ED on 10/09/22 due to a fall. Was in the ED 04/08/23 due to a mechanical fall.  She presents today, with her daughter, for an acute HF f/u visit with a chief complaint of shortness of breath (worsening). Has associated fatigue and worsening pedal edema.  Last 3 days, leg swelling has worsened where she has to stop her daily walks because of her SOB. Has not been elevating her legs as much as she previously was doing. She denies chest pain, palpitations, abdominal distention, dizziness or difficulty sleeping.   Daughter gave patient extra torsemide  last night but has not given her metolazone  yet.   She continues to go to an adult day care M-F and does elevate her legs in a recliner after lunch while there. She thinks the swelling is better on the weekends when she is home and able to elevate her legs more often.   ROS: All systems negative except as listed in HPI, PMH and Problem List.  SH:  Social History   Socioeconomic History   Marital status: Widowed    Spouse name: Not on file   Number of children: Not on file   Years of education: Not on file   Highest education level: Some college, no degree  Occupational History   Not on file  Tobacco Use   Smoking status: Never   Smokeless tobacco: Never  Substance and Sexual Activity   Alcohol use: Not Currently   Drug use: Never   Sexual activity: Not Currently  Other Topics Concern   Not on file  Social History Narrative   Not on  file   Social Drivers of Health   Financial Resource Strain: Low Risk  (11/24/2023)   Overall Financial Resource Strain (CARDIA)    Difficulty of Paying Living Expenses: Not hard at all  Food Insecurity: No Food Insecurity (11/24/2023)   Hunger Vital Sign    Worried About Running Out of Food in the Last Year: Never true    Ran Out of Food in the Last Year: Never true  Transportation Needs: No Transportation Needs (11/24/2023)   PRAPARE - Administrator, Civil Service (Medical): No    Lack of Transportation (Non-Medical): No  Physical Activity: Sufficiently Active (11/24/2023)   Exercise Vital Sign    Days of Exercise per Week: 7 days    Minutes of Exercise per Session: 30 min  Stress: No Stress Concern Present (11/24/2023)   [employer] of Occupational Health - Occupational Stress Questionnaire    Feeling of Stress : Not at all  Social Connections: Moderately Isolated (11/24/2023)   Social Connection and Isolation Panel [NHANES]    Frequency of Communication with Friends and Family: More than three times a week    Frequency of Social Gatherings with Friends and Family: More than three times a week    Attends Religious Services: More than 4 times per year    Active Member of [employer] or Organizations: No    Attends Banker Meetings: Never  Marital Status: Widowed  Intimate Partner Violence: Not At Risk (07/08/2023)   Humiliation, Afraid, Rape, and Kick questionnaire    Fear of Current or Ex-Partner: No    Emotionally Abused: No    Physically Abused: No    Sexually Abused: No    FH:  Family History  Problem Relation Age of Onset   Heart failure Mother    Heart attack Father    Heart attack Daughter     Past Medical History:  Diagnosis Date   Arrhythmia    atrial fibrillation   Congestive heart failure (CHF) (HCC)    Hypertension    Pacemaker 2015    Current Outpatient Medications  Medication Sig Dispense Refill   albuterol  (VENTOLIN   HFA) 108 (90 Base) MCG/ACT inhaler Inhale 2 puffs into the lungs every 6 (six) hours as needed for wheezing or shortness of breath. 17 each 2   amLODipine  (NORVASC ) 5 MG tablet Take 1 tablet (5 mg total) by mouth daily. 90 tablet 1   atorvastatin  (LIPITOR) 10 MG tablet Take 1 tablet (10 mg total) by mouth daily. 90 tablet 1   benzonatate  (TESSALON ) 100 MG capsule Take 1 capsule (100 mg total) by mouth 2 (two) times daily as needed for cough. (Patient not taking: Reported on 04/28/2024) 20 capsule 0   calcium  carbonate (OS-CAL - DOSED IN MG OF ELEMENTAL CALCIUM ) 1250 (500 Ca) MG tablet Take 1 tablet by mouth 2 (two) times daily with a meal.     dipyridamole -aspirin  (AGGRENOX ) 200-25 MG 12hr capsule TAKE 1 CAPSULE BY MOUTH TWICE A DAY 180 capsule 0   JARDIANCE  10 MG TABS tablet TAKE 1 TABLET (10 MG TOTAL) BY MOUTH DAILY. NEEDS APPT FOR FURTHER REFILLS 90 tablet 2   loratadine (CLARITIN) 10 MG tablet Take 10 mg by mouth daily.     losartan  (COZAAR ) 50 MG tablet Take 1 tablet (50 mg total) by mouth 2 (two) times daily. 180 tablet 1   metolazone  (ZAROXOLYN ) 2.5 MG tablet Take 2.5 mg by mouth daily as needed.     metoprolol  (TOPROL -XL) 200 MG 24 hr tablet Take 1 tablet (200 mg total) by mouth daily. 90 tablet 0   Multiple Vitamins-Minerals (PRESERVISION AREDS 2 PO) Take 1 tablet by mouth in the morning and at bedtime.     multivitamin-iron-minerals-folic acid (CENTRUM) chewable tablet Chew 1 tablet by mouth daily.     Omega-3 Fatty Acids (FISH OIL) 1000 MG CAPS Take 1 capsule by mouth daily.     pantoprazole  (PROTONIX ) 20 MG tablet Take 1 tablet (20 mg total) by mouth daily. 30 tablet 2   potassium chloride  (KLOR-CON ) 10 MEQ tablet Take 1 tablet (10 mEq total) by mouth as needed. Only if she takes an additional dose of lasix  90 tablet 1   Spacer/Aero-Holding Chambers (OPTICHAMBER DIAMOND ) MISC For use with PRN inhaler (Patient not taking: Reported on 04/28/2024) 1 each 0   torsemide  (DEMADEX ) 20 MG tablet  TAKE 2 TABLETS (40 MG TOTAL) BY MOUTH DAILY. TAKE AN ADDITIONAL 20 MG AS NEEDED BASED ON DAILY WEIGHTS. 200 tablet 0   No current facility-administered medications for this visit.   Vitals:   05/19/24 1519  BP: 116/64  Pulse: 76  SpO2: 95%  Weight: 113 lb (51.3 kg)   Wt Readings from Last 3 Encounters:  05/19/24 113 lb (51.3 kg)  04/28/24 107 lb (48.5 kg)  03/16/24 115 lb 9.6 oz (52.4 kg)   Lab Results  Component Value Date   CREATININE 1.10 (H) 05/19/2024  CREATININE 1.03 (H) 03/16/2024   CREATININE 1.18 (H) 03/09/2024   PHYSICAL EXAM:  General: Well appearing. No resp difficulty HEENT: HOH Neck: supple, no JVD Cor: Regular rhythm, rate. No rubs, gallops or murmurs Lungs: clear Abdomen: soft, nontender, nondistended. Extremities: no cyanosis, clubbing, rash, 2+ pitting edema left lower leg, 3+ edema right lower leg Neuro: alert & oriented X 3. Moves all 4 extremities w/o difficulty. Affect pleasant   ECG: not done   ASSESSMENT & PLAN:  Chronic heart failure with preserved ejection fraction- - suspect due to atrial fibrillation - NYHA III - fluid up with worsening symptoms and edema - weighing daily; reminded to call for an overnight weight gain of > 2 pounds or a weekly weight gain of > 5 pounds - weight up 4 pounds from last visit here 4 months ago - take metolazone  2.5mg  / potassium 10meq daily X 2 days; will get BMET back to decide about more doses over the weekend - BMET/ BNP today - Echo 02/26/22: EF of 60-65% along with mild LVH, moderately elevated PA pressure, moderate MR, moderate/severe TR, mild/moderate AS and severe LAE.  - goes to an adult day care each day, reports that she eats low sodium diet there - has not been elevating his legs as much as previously; encouraged to elevate legs as much as possible - saw cardiology, Dr. Junnie Olives 07/24 - continue jardiance  10mg  daily - continue losartan  50mg  BID - continue metoprolol  succinate 200mg  daily -  continue torsemide  40mg  daily and extra 20mg  PRN/ potassium if takes extra torsemide  - BNP 09/17/23 was 664.9  Atrial fibrillation - has pacemaker - device remotely interrogated, 2 years battery left - saw EP Marven Slimmer) 06/23 - not on anticoagulation due to history of macular degeneration and fall risk - BMP 03/16/24 reviewed: sodium 137, potassium 3.9, creatinine 1.03 & GFR 50  3: Lymphedema- - stage 2 - currently has right lower leg wrapped due to edema - wears compression socks daily; discussed making vascular referral for management of varicosities but she defers at this time - is quite active and walked into the office - goes to an adult daycare daily M-F - saw wound center Lindsay Rho) 07/24 - saw PCP ([facility]) 05/25 - right leg u/s negative for DVT 04/25   Return in 2 weeks, sooner if needed.   Charlette Console, FNP 05/19/24

## 2024-05-19 NOTE — Telephone Encounter (Signed)
 Agree with UC evaluation and follow up in clinic for next available appt Concern for unilateral swelling

## 2024-05-20 NOTE — Telephone Encounter (Signed)
FYI please see the message below.

## 2024-05-20 NOTE — Telephone Encounter (Signed)
 Reviewed

## 2024-05-21 ENCOUNTER — Ambulatory Visit: Payer: Self-pay | Admitting: Cardiology

## 2024-06-07 ENCOUNTER — Encounter: Admitting: Family

## 2024-06-14 ENCOUNTER — Other Ambulatory Visit: Payer: Self-pay | Admitting: Family

## 2024-06-14 ENCOUNTER — Ambulatory Visit: Admitting: Family

## 2024-06-14 ENCOUNTER — Encounter: Payer: Self-pay | Admitting: Emergency Medicine

## 2024-06-14 ENCOUNTER — Telehealth: Payer: Self-pay

## 2024-06-14 ENCOUNTER — Encounter: Payer: Self-pay | Admitting: Family

## 2024-06-14 ENCOUNTER — Inpatient Hospital Stay
Admission: EM | Admit: 2024-06-14 | Discharge: 2024-07-09 | DRG: 640 | Disposition: E | Source: Ambulatory Visit | Attending: Student | Admitting: Student

## 2024-06-14 ENCOUNTER — Emergency Department

## 2024-06-14 ENCOUNTER — Ambulatory Visit
Admission: RE | Admit: 2024-06-14 | Discharge: 2024-06-14 | Disposition: A | Discharge status: Home / Self Care | Source: Ambulatory Visit | Attending: Family

## 2024-06-14 ENCOUNTER — Ambulatory Visit: Payer: Self-pay | Admitting: Family

## 2024-06-14 ENCOUNTER — Other Ambulatory Visit: Payer: Self-pay

## 2024-06-14 VITALS — BP 95/55 | HR 85 | Wt 120.4 lb

## 2024-06-14 DIAGNOSIS — I5043 Acute on chronic combined systolic (congestive) and diastolic (congestive) heart failure: Secondary | ICD-10-CM

## 2024-06-14 DIAGNOSIS — I890 Lymphedema, not elsewhere classified: Secondary | ICD-10-CM | POA: Diagnosis present

## 2024-06-14 DIAGNOSIS — N179 Acute kidney failure, unspecified: Secondary | ICD-10-CM | POA: Diagnosis not present

## 2024-06-14 DIAGNOSIS — I110 Hypertensive heart disease with heart failure: Secondary | ICD-10-CM | POA: Insufficient documentation

## 2024-06-14 DIAGNOSIS — R54 Age-related physical debility: Secondary | ICD-10-CM | POA: Diagnosis present

## 2024-06-14 DIAGNOSIS — K219 Gastro-esophageal reflux disease without esophagitis: Secondary | ICD-10-CM | POA: Diagnosis present

## 2024-06-14 DIAGNOSIS — E860 Dehydration: Secondary | ICD-10-CM | POA: Diagnosis present

## 2024-06-14 DIAGNOSIS — J849 Interstitial pulmonary disease, unspecified: Secondary | ICD-10-CM | POA: Diagnosis present

## 2024-06-14 DIAGNOSIS — M7981 Nontraumatic hematoma of soft tissue: Secondary | ICD-10-CM | POA: Diagnosis not present

## 2024-06-14 DIAGNOSIS — I130 Hypertensive heart and chronic kidney disease with heart failure and stage 1 through stage 4 chronic kidney disease, or unspecified chronic kidney disease: Secondary | ICD-10-CM | POA: Diagnosis present

## 2024-06-14 DIAGNOSIS — I5032 Chronic diastolic (congestive) heart failure: Secondary | ICD-10-CM | POA: Diagnosis not present

## 2024-06-14 DIAGNOSIS — R0602 Shortness of breath: Secondary | ICD-10-CM | POA: Diagnosis not present

## 2024-06-14 DIAGNOSIS — I480 Paroxysmal atrial fibrillation: Secondary | ICD-10-CM | POA: Diagnosis present

## 2024-06-14 DIAGNOSIS — I4891 Unspecified atrial fibrillation: Secondary | ICD-10-CM | POA: Insufficient documentation

## 2024-06-14 DIAGNOSIS — Z8249 Family history of ischemic heart disease and other diseases of the circulatory system: Secondary | ICD-10-CM | POA: Diagnosis not present

## 2024-06-14 DIAGNOSIS — H9193 Unspecified hearing loss, bilateral: Secondary | ICD-10-CM | POA: Diagnosis present

## 2024-06-14 DIAGNOSIS — Z7984 Long term (current) use of oral hypoglycemic drugs: Secondary | ICD-10-CM | POA: Diagnosis not present

## 2024-06-14 DIAGNOSIS — I4821 Permanent atrial fibrillation: Secondary | ICD-10-CM

## 2024-06-14 DIAGNOSIS — J9601 Acute respiratory failure with hypoxia: Secondary | ICD-10-CM | POA: Diagnosis present

## 2024-06-14 DIAGNOSIS — Z950 Presence of cardiac pacemaker: Secondary | ICD-10-CM

## 2024-06-14 DIAGNOSIS — W19XXXA Unspecified fall, initial encounter: Secondary | ICD-10-CM | POA: Insufficient documentation

## 2024-06-14 DIAGNOSIS — Z90710 Acquired absence of both cervix and uterus: Secondary | ICD-10-CM | POA: Diagnosis not present

## 2024-06-14 DIAGNOSIS — E875 Hyperkalemia: Secondary | ICD-10-CM | POA: Diagnosis present

## 2024-06-14 DIAGNOSIS — I10 Essential (primary) hypertension: Secondary | ICD-10-CM | POA: Diagnosis not present

## 2024-06-14 DIAGNOSIS — R42 Dizziness and giddiness: Secondary | ICD-10-CM | POA: Insufficient documentation

## 2024-06-14 DIAGNOSIS — E871 Hypo-osmolality and hyponatremia: Secondary | ICD-10-CM | POA: Diagnosis present

## 2024-06-14 DIAGNOSIS — S8012XA Contusion of left lower leg, initial encounter: Secondary | ICD-10-CM | POA: Insufficient documentation

## 2024-06-14 DIAGNOSIS — N1831 Chronic kidney disease, stage 3a: Secondary | ICD-10-CM | POA: Diagnosis present

## 2024-06-14 DIAGNOSIS — Z7982 Long term (current) use of aspirin: Secondary | ICD-10-CM | POA: Diagnosis not present

## 2024-06-14 DIAGNOSIS — Z881 Allergy status to other antibiotic agents status: Secondary | ICD-10-CM

## 2024-06-14 DIAGNOSIS — H547 Unspecified visual loss: Secondary | ICD-10-CM | POA: Diagnosis present

## 2024-06-14 DIAGNOSIS — Z79899 Other long term (current) drug therapy: Secondary | ICD-10-CM | POA: Diagnosis not present

## 2024-06-14 DIAGNOSIS — R600 Localized edema: Secondary | ICD-10-CM | POA: Insufficient documentation

## 2024-06-14 DIAGNOSIS — Z66 Do not resuscitate: Secondary | ICD-10-CM | POA: Diagnosis present

## 2024-06-14 DIAGNOSIS — I5033 Acute on chronic diastolic (congestive) heart failure: Secondary | ICD-10-CM | POA: Diagnosis present

## 2024-06-14 DIAGNOSIS — Z8673 Personal history of transient ischemic attack (TIA), and cerebral infarction without residual deficits: Secondary | ICD-10-CM

## 2024-06-14 DIAGNOSIS — Z515 Encounter for palliative care: Secondary | ICD-10-CM

## 2024-06-14 DIAGNOSIS — I959 Hypotension, unspecified: Secondary | ICD-10-CM | POA: Insufficient documentation

## 2024-06-14 DIAGNOSIS — Z885 Allergy status to narcotic agent status: Secondary | ICD-10-CM | POA: Diagnosis not present

## 2024-06-14 DIAGNOSIS — E785 Hyperlipidemia, unspecified: Secondary | ICD-10-CM | POA: Diagnosis present

## 2024-06-14 DIAGNOSIS — N1832 Chronic kidney disease, stage 3b: Secondary | ICD-10-CM | POA: Diagnosis not present

## 2024-06-14 LAB — BASIC METABOLIC PANEL WITH GFR
Anion gap: 15 (ref 5–15)
Anion gap: 17 — ABNORMAL HIGH (ref 5–15)
BUN: 96 mg/dL — ABNORMAL HIGH (ref 8–23)
BUN: 98 mg/dL — ABNORMAL HIGH (ref 8–23)
CO2: 23 mmol/L (ref 22–32)
CO2: 23 mmol/L (ref 22–32)
Calcium: 8.6 mg/dL — ABNORMAL LOW (ref 8.9–10.3)
Calcium: 8.8 mg/dL — ABNORMAL LOW (ref 8.9–10.3)
Chloride: 76 mmol/L — ABNORMAL LOW (ref 98–111)
Chloride: 77 mmol/L — ABNORMAL LOW (ref 98–111)
Creatinine, Ser: 1.35 mg/dL — ABNORMAL HIGH (ref 0.44–1.00)
Creatinine, Ser: 1.31 mg/dL — ABNORMAL HIGH (ref 0.44–1.00)
GFR, Estimated: 36 mL/min — ABNORMAL LOW (ref >60)
GFR, Estimated: 37 mL/min — ABNORMAL LOW (ref >60)
Glucose, Bld: 110 mg/dL — ABNORMAL HIGH (ref 70–99)
Glucose, Bld: 117 mg/dL — ABNORMAL HIGH (ref 70–99)
Potassium: 4.1 mmol/L (ref 3.5–5.1)
Potassium: 4.4 mmol/L (ref 3.5–5.1)
Sodium: 114 mmol/L — CL (ref 135–145)
Sodium: 117 mmol/L — CL (ref 135–145)

## 2024-06-14 LAB — CBC
HCT: 35.8 % — ABNORMAL LOW (ref 36.0–46.0)
Hemoglobin: 12.7 g/dL (ref 12.0–15.0)
MCH: 33.8 pg (ref 26.0–34.0)
MCHC: 35.5 g/dL (ref 30.0–36.0)
MCV: 95.2 fL (ref 80.0–100.0)
Platelets: 206 K/uL (ref 150–400)
RBC: 3.76 MIL/uL — ABNORMAL LOW (ref 3.87–5.11)
RDW: 13.7 % (ref 11.5–15.5)
WBC: 5.5 K/uL (ref 4.0–10.5)
nRBC: 0 % (ref 0.0–0.2)

## 2024-06-14 LAB — PHOSPHORUS: Phosphorus: 3.8 mg/dL (ref 2.5–4.6)

## 2024-06-14 LAB — OSMOLALITY: Osmolality: 277 mosm/kg (ref 275–295)

## 2024-06-14 LAB — BRAIN NATRIURETIC PEPTIDE: B Natriuretic Peptide: 770.5 pg/mL — ABNORMAL HIGH (ref 0.0–100.0)

## 2024-06-14 LAB — SODIUM
Sodium: 115 mmol/L — CL (ref 135–145)
Sodium: 116 mmol/L — CL (ref 135–145)

## 2024-06-14 MED ORDER — FUROSEMIDE 10 MG/ML IJ SOLN
80.0000 mg | Freq: Once | INTRAMUSCULAR | Status: AC
  Administered 2024-06-14: 80 mg via INTRAVENOUS

## 2024-06-14 MED ORDER — LOSARTAN POTASSIUM 25 MG PO TABS
25.0000 mg | ORAL_TABLET | Freq: Every day | ORAL | 3 refills | Status: DC
Start: 2024-06-14 — End: 2024-06-28

## 2024-06-14 MED ORDER — DM-GUAIFENESIN ER 30-600 MG PO TB12: 1.0000 | ORAL_TABLET | Freq: Two times a day (BID) | ORAL | Status: DC | PRN

## 2024-06-14 MED ORDER — POTASSIUM CHLORIDE CRYS ER 20 MEQ PO TBCR
EXTENDED_RELEASE_TABLET | ORAL | Status: AC
  Filled 2024-06-14: qty 2

## 2024-06-14 MED ORDER — SODIUM CHLORIDE 3 % IV SOLN
INTRAVENOUS | Status: DC
  Filled 2024-06-14 (×3): qty 500

## 2024-06-14 MED ORDER — FUROSEMIDE 10 MG/ML IJ SOLN: 40.0000 mg | Freq: Two times a day (BID) | INTRAMUSCULAR | Status: DC

## 2024-06-14 MED ORDER — ALBUTEROL SULFATE (2.5 MG/3ML) 0.083% IN NEBU: 2.5000 mg | INHALATION_SOLUTION | RESPIRATORY_TRACT | Status: DC | PRN

## 2024-06-14 MED ORDER — ACETAMINOPHEN 325 MG PO TABS
650.0000 mg | ORAL_TABLET | Freq: Four times a day (QID) | ORAL | Status: DC | PRN
  Administered 2024-06-16 – 2024-06-23 (×8): 650 mg via ORAL
  Filled 2024-06-14 (×9): qty 2

## 2024-06-14 MED ORDER — ONDANSETRON HCL 4 MG/2ML IJ SOLN
4.0000 mg | Freq: Three times a day (TID) | INTRAMUSCULAR | Status: DC | PRN
  Administered 2024-06-17: 4 mg via INTRAVENOUS
  Filled 2024-06-14: qty 2

## 2024-06-14 MED ORDER — ENOXAPARIN SODIUM 30 MG/0.3ML IJ SOSY
30.0000 mg | PREFILLED_SYRINGE | INTRAMUSCULAR | Status: DC
  Administered 2024-06-14 – 2024-06-21 (×8): 30 mg via SUBCUTANEOUS
  Filled 2024-06-14 (×8): qty 0.3

## 2024-06-14 MED ORDER — FUROSEMIDE 10 MG/ML IJ SOLN
40.0000 mg | Freq: Two times a day (BID) | INTRAMUSCULAR | Status: DC
  Administered 2024-06-15 – 2024-06-17 (×3): 40 mg via INTRAVENOUS
  Filled 2024-06-14 (×3): qty 4

## 2024-06-14 MED ORDER — FUROSEMIDE 10 MG/ML IJ SOLN
INTRAMUSCULAR | Status: AC
  Filled 2024-06-14: qty 8

## 2024-06-14 MED ORDER — POTASSIUM CHLORIDE CRYS ER 20 MEQ PO TBCR
40.0000 meq | EXTENDED_RELEASE_TABLET | Freq: Once | ORAL | Status: AC
  Administered 2024-06-14: 40 meq via ORAL

## 2024-06-14 MED ORDER — HYDRALAZINE HCL 20 MG/ML IJ SOLN: 5.0000 mg | INTRAMUSCULAR | Status: DC | PRN

## 2024-06-14 NOTE — H&P (Signed)
 History and Physical    [name] FMW:[id number] DOB: 1925-04-08 DOA: 06/14/2024  Referring MD/NP/PA:   PCP: Sharma Coyer, MD   Patient coming from:  The patient is coming from home.     Chief Complaint: SOB and abnormal lab with hyponatremia  HPI: [name] is a 89 y.o. female with medical history significant of dCHF, HTN, HLD, PMM due to sinus pause 2007, GERD, CKD-CVA, A-fib, blindness, HOH, who presents with SOB and abdominal labs with hyponatremia.  Pt has HOH and is very difficult to communicate.  She reports having shortness breath.  I called her daughter by phone who provided additional medical history. Pt has increased SOB and lower extremity edema recently. Pt is following up with advanced heart failure clinic. She was treated with 2 doses of metolazone  in addition to torsemide . She was given 80 mg of IV Lasix  in the clinic today. They have made several additional medication adjustments as follows:  - continue jardiance  10mg  daily - decrease losartan  to 25mg  daily due to hypotension - continue metoprolol  succinate 200mg  daily - continue torsemide  40mg  daily for now and extra 20mg  PRN/ potassium 10meq if takes extra torsemide   Today labs from clinic showed hyponatremia with sodium of 114 for which patient was directed to the ER. Pt has intermittent confusion, fatigue, and poor balance.  Patient does not have chest pain, abdominal pain, nausea, vomiting, diarrhea.  No symptoms of UTI.  No fever or chills. When I saw pt in ED, she is not confused.  Data reviewed independently and ED Course: pt was found to have sodium 117, BNP 770, worsening renal function, temperature normal, blood pressure 84/46 which improved to 99/60 --> then 103/56 in ED, heart rate 85 --> 60, oxygen saturation 96% on room air.  Patient is admitted to PCU as inpatient.  Chest x-ray has increased interstitial opacity to my reveiew.  Dr. Douglas of renal is consulted.   EKG: I have personally  reviewed.  Paced rhythm, QTc 475   Review of Systems:   General: no fevers, chills, has poor appetite, has fatigue HEENT: has blindness and HOH Respiratory: has dyspnea, no coughing, wheezing CV: no chest pain, no palpitations GI: no nausea, vomiting, abdominal pain, diarrhea, constipation GU: no dysuria, burning on urination, increased urinary frequency, hematuria  Ext: has leg edema Neuro: no unilateral weakness, numbness, or tingling.  Has blindness and hearing loss. Skin: no rash, no skin tear. MSK: No muscle spasm, no deformity, no limitation of range of movement in spin Heme: No easy bruising.  Travel history: No recent long distant travel.   Allergy:  Allergies  Allergen Reactions   Moxifloxacin Itching and Swelling    Swelling and itching of the eyes.   Tobramycin Itching and Swelling    Swelling and itching of the eyelids.   Codeine     Other reaction(s): Hallucination    Past Medical History:  Diagnosis Date   Arrhythmia    atrial fibrillation   Congestive heart failure (CHF) (HCC)    Hypertension    Pacemaker 2015    Past Surgical History:  Procedure Laterality Date   ABDOMINAL HYSTERECTOMY     EYE SURGERY     MOHS SURGERY  2023   PACEMAKER PLACEMENT N/A 2007   2017    Social History:  reports that she has never smoked. She has never used smokeless tobacco. She reports that she does not currently use alcohol. She reports that she does not use drugs.  Family History:  Family  History  Problem Relation Age of Onset   Heart failure Mother    Heart attack Father    Heart attack Daughter      Prior to Admission medications   Medication Sig Start Date End Date Taking? Authorizing Provider  acetaminophen  (TYLENOL ) 500 MG tablet Take 500 mg by mouth every 6 (six) hours as needed for moderate pain (pain score 4-6).    [provider]  albuterol  (VENTOLIN  HFA) 108 (90 Base) MCG/ACT inhaler Inhale 2 puffs into the lungs every 6 (six) hours as  needed for wheezing or shortness of breath. 11/26/23   Emilio Kelly DASEN, FNP  atorvastatin  (LIPITOR) 10 MG tablet Take 1 tablet (10 mg total) by mouth daily. 04/05/24   Simmons-Robinson, Rockie, MD  benzonatate  (TESSALON ) 100 MG capsule Take 1 capsule (100 mg total) by mouth 2 (two) times daily as needed for cough. 03/16/24   Simmons-Robinson, Rockie, MD  calcium  carbonate (OS-CAL - DOSED IN MG OF ELEMENTAL CALCIUM ) 1250 (500 Ca) MG tablet Take 1 tablet by mouth 2 (two) times daily with a meal.    [provider]  dipyridamole -aspirin  (AGGRENOX ) 200-25 MG 12hr capsule TAKE 1 CAPSULE BY MOUTH TWICE A DAY 05/17/24   Simmons-Robinson, Makiera, MD  JARDIANCE  10 MG TABS tablet TAKE 1 TABLET (10 MG TOTAL) BY MOUTH DAILY. NEEDS APPT FOR FURTHER REFILLS 08/29/23   [place] A, FNP  loratadine (CLARITIN) 10 MG tablet Take 10 mg by mouth daily.    [provider]  losartan  (COZAAR ) 25 MG tablet Take 1 tablet (25 mg total) by mouth daily. 06/14/24   [place] LABOR, FNP  metolazone  (ZAROXOLYN ) 2.5 MG tablet Take 2.5 mg by mouth daily as needed.    [provider]  metoprolol  (TOPROL -XL) 200 MG 24 hr tablet Take 1 tablet (200 mg total) by mouth daily. 05/13/24   Simmons-Robinson, Rockie, MD  Multiple Vitamins-Minerals (PRESERVISION AREDS 2 PO) Take 1 tablet by mouth in the morning and at bedtime.    [provider]  multivitamin-iron-minerals-folic acid (CENTRUM) chewable tablet Chew 1 tablet by mouth daily.    [provider]  Omega-3 Fatty Acids (FISH OIL) 1000 MG CAPS Take 1 capsule by mouth daily.    [provider]  pantoprazole  (PROTONIX ) 20 MG tablet Take 1 tablet (20 mg total) by mouth daily. 04/28/24   Simmons-Robinson, Makiera, MD  potassium chloride  (KLOR-CON ) 10 MEQ tablet Take 1 tablet (10 mEq total) by mouth as needed. Only if she takes an additional dose of lasix  05/05/24   Gasper Nancyann BRAVO, MD  Spacer/Aero-Holding Chambers (OPTICHAMBER DIAMOND ) MISC  For use with PRN inhaler 11/26/23   Emilio Kelly T, FNP  torsemide  (DEMADEX ) 20 MG tablet TAKE 2 TABLETS (40 MG TOTAL) BY MOUTH DAILY. TAKE AN ADDITIONAL 20 MG AS NEEDED BASED ON DAILY WEIGHTS. 04/20/24   Sharma Rockie, MD    Physical Exam: Vitals:   06/14/24 1800 06/14/24 1830 06/14/24 1900 06/14/24 1910  BP: 99/60 106/63 (!) 103/58   Pulse:  (!) 58 (!) 58   Resp: 20 18 16    Temp:    98.2 F (36.8 C)  TempSrc:    Oral  SpO2: 96% 96% 96%   Weight:      Height:       General: Not in acute distress HEENT:       Eyes: PERRL, EOMI, no jaundice       ENT: No discharge from the ears and nose, no pharynx injection, no tonsillar enlargement.  Neck: positive JVD, no bruit, no mass felt. Heme: No neck lymph node enlargement. Cardiac: S1/S2, RRR, No murmurs, No gallops or rubs. Respiratory: has fine crackles bilaterally GI: Soft, nondistended, nontender, no rebound pain, no organomegaly, BS present. GU: No hematuria Ext: has 2+ pitting leg edema bilaterally. 1+DP/PT pulse bilaterally. Musculoskeletal: No joint deformities, No joint redness or warmth, no limitation of ROM in spin. Skin: No rashes.  Neuro: Alert, following command, with HOH and blindness, moves all extremities normally.  Psych: Patient is not psychotic, no suicidal or hemocidal ideation.  Labs on Admission: I have personally reviewed following labs and imaging studies  CBC: Recent Labs  Lab 06/14/24 1731  WBC 5.5  HGB 12.7  HCT 35.8*  MCV 95.2  PLT 206   Basic Metabolic Panel: Recent Labs  Lab 06/14/24 1503 06/14/24 1731  NA 114* 117*  K 4.1 4.4  CL 76* 77*  CO2 23 23  GLUCOSE 110* 117*  BUN 96* 98*  CREATININE 1.35* 1.31*  CALCIUM  8.6* 8.8*   GFR: Estimated Creatinine Clearance: 18.9 mL/min (A) (by C-G formula based on SCr of 1.31 mg/dL (H)). Liver Function Tests: No results for input(s): AST, ALT, ALKPHOS, BILITOT, PROT, ALBUMIN in the last 168 hours. No results for  input(s): LIPASE, AMYLASE in the last 168 hours. No results for input(s): AMMONIA in the last 168 hours. Coagulation Profile: No results for input(s): INR, PROTIME in the last 168 hours. Cardiac Enzymes: No results for input(s): CKTOTAL, CKMB, CKMBINDEX, TROPONINI in the last 168 hours. BNP (last 3 results) No results for input(s): PROBNP in the last 8760 hours. HbA1C: No results for input(s): HGBA1C in the last 72 hours. CBG: No results for input(s): GLUCAP in the last 168 hours. Lipid Profile: No results for input(s): CHOL, HDL, LDLCALC, TRIG, CHOLHDL, LDLDIRECT in the last 72 hours. Thyroid Function Tests: No results for input(s): TSH, T4TOTAL, FREET4, T3FREE, THYROIDAB in the last 72 hours. Anemia Panel: No results for input(s): VITAMINB12, FOLATE, FERRITIN, TIBC, IRON, RETICCTPCT in the last 72 hours. Urine analysis:    Component Value Date/Time   COLORURINE STRAW (A) 04/08/2023 1258   APPEARANCEUR Clear 12/01/2023 0821   LABSPEC 1.004 (L) 04/08/2023 1258   PHURINE 7.0 04/08/2023 1258   GLUCOSEU Negative 12/01/2023 0821   HGBUR NEGATIVE 04/08/2023 1258   BILIRUBINUR Negative 12/01/2023 0821   KETONESUR NEGATIVE 04/08/2023 1258   PROTEINUR Negative 12/01/2023 0821   PROTEINUR NEGATIVE 04/08/2023 1258   UROBILINOGEN 0.2 04/10/2022 1520   NITRITE Negative 12/01/2023 0821   NITRITE NEGATIVE 04/08/2023 1258   LEUKOCYTESUR Negative 12/01/2023 0821   LEUKOCYTESUR TRACE (A) 04/08/2023 1258   Sepsis Labs: @LABRCNTIP (procalcitonin:4,lacticidven:4) )No results found for this or any previous visit (from the past 240 hours).   Radiological Exams on Admission:   Assessment/Plan Principal Problem:   Hyponatremia Active Problems:   Acute on chronic diastolic CHF (congestive heart failure) (HCC)   Primary hypertension   HLD (hyperlipidemia)   Paroxysmal atrial fibrillation (HCC)   Chronic kidney disease, stage 3b  (HCC)   Assessment and Plan:   Hyponatremia:  Patient has hypervolemic hyponatremia. Her sodium is 117 in ED. Will treat patient with hypertonic saline and IV Lasix .  Patient received 80 mg Lasix  in clinic today.  -Will admit to SDU as inpt - Will check urine sodium, urine osmolality, serum osmolality. - Fluid restriction - Hypertonic 3% saline at 50cc/hr  - Lasix  40 mg bid (start at 4:00 AM tomorrow)  -hold home Cozaar , metolazone  and torsemide  -  check  q2hour --> then q4h, do not correct faster than 0.5 mEq/hr with pharmacist consult - avoid over correction too fast due to risk of central pontine myelinolysis - consult Dr. Douglas of renal  Acute on chronic diastolic CHF (congestive heart failure) (HCC): 2D echo on 06/03/2023 showed EF 55 to 60%.  Patient has SOB, elevated BNP 770, positive JVD and increased interstitial opacity on chest x-ray, clinically consistent with CHF exacerbation. -Lasix  40 mg bid by IV as above -Daily weights -strict I/O's -Low salt diet -Fluid restriction -As needed bronchodilators for shortness of breath  Primary hypertension: -Hold amlodipine , Cozaar , metolazone  and torsemide  due to softer blood pressure. - Patient is on IV Lasix  as above - IV hydralazine  as needed  HLD (hyperlipidemia) -Lipitor  Paroxysmal atrial fibrillation [facility]): Patient is not taking anticoagulants currently due to history of macular degeneration and fall risk . Heart rate 60-80s -continue metoprolol  with holding parameters of SBP< 95   Chronic kidney disease, stage 3b (HCC): Recent baseline creatinine 1.10.  Her creatinine is 1.31, BUN 98, GFR 37, slightly worsening. - Hold Cozaar , metolazone  and torsemide  as above - Patient is hypertonic saline infusion - f/u with BMP      DVT ppx: SQ Lovenox   Code Status: DNR per her daughter  Family Communication:     Yes, patient's daughter by phone    Disposition Plan:  Anticipate discharge back to previous  environment  Consults called: Dr. Douglas of nephrology  Admission status and Level of care: Stepdown: as inpt        Dispo: The patient is from: Home              Anticipated d/c is to: Home              Anticipated d/c date is: 2 days              Patient currently is not medically stable to d/c.    Severity of Illness:  The appropriate patient status for this patient is INPATIENT. Inpatient status is judged to be reasonable and necessary in order to provide the required intensity of service to ensure the patient's safety. The patient's presenting symptoms, physical exam findings, and initial radiographic and laboratory data in the context of their chronic comorbidities is felt to place them at high risk for further clinical deterioration. Furthermore, it is not anticipated that the patient will be medically stable for discharge from the hospital within 2 midnights of admission.   * I certify that at the point of admission it is my clinical judgment that the patient will require inpatient hospital care spanning beyond 2 midnights from the point of admission due to high intensity of service, high risk for further deterioration and high frequency of surveillance required.*       Date of Service 06/14/2024    Caleb Exon [facility]   If 7PM-7AM, please contact night-coverage [email or web address] 06/14/2024, 7:43 PM

## 2024-06-14 NOTE — Progress Notes (Signed)
 Advanced Heart Failure Clinic Note    PCP: Sharma Beauvais, MD  Primary Cardiologist: Darliss Rogue, MD   Chief Complaint: shortness of breath   HPI:  [name] is a 89 year old female with a history of afib, sinus pauses s/p pacemaker (2007, 2015 [employer]) and chronic heart failure.   Echo 02/26/22: EF of 60-65% along with mild LVH, moderately elevated PA pressure, moderate MR, moderate/severe TR, mild/moderate AS and severe LAE.   Was in the ED on 10/09/22 due to a fall. Was in the ED 04/08/23 due to a mechanical fall.  Seen in [facility] 06/25 for an acute visit with worsening symptoms. Given 2 doses of metolazone / potassium with additional dose if needed.   She presents today, with her daughter, for an acute HF visit with a chief complaint of shortness of breath. She feels like this has worsened over the last several days. Has associated fatigue, light-headedness, feeling of being off balance, pedal edema (worsening) and intermittent confusion. Sleeping ok on 1 pillow. Had a fall a few days ago and quite a bit of bruising on left lower leg  Daughter has given her metolazone  daily for the last 5 days due to worsening edema / weight gain. Daughter says that patient has been asking to drink a lot of water but unclear as to how much.   She continues to go to an adult day care M-F and does elevate her legs in a recliner after lunch while there.  ROS: All systems negative except as listed in HPI, PMH and Problem List.  SH:  Social History   Socioeconomic History   Marital status: Widowed    Spouse name: Not on file   Number of children: Not on file   Years of education: Not on file   Highest education level: Some college, no degree  Occupational History   Not on file  Tobacco Use   Smoking status: Never   Smokeless tobacco: Never  Substance and Sexual Activity   Alcohol use: Not Currently   Drug use: Never   Sexual activity: Not Currently  Other Topics Concern    Not on file  Social History Narrative   Not on file   Social Drivers of Health   Financial Resource Strain: Low Risk  (11/24/2023)   Overall Financial Resource Strain (CARDIA)    Difficulty of Paying Living Expenses: Not hard at all  Food Insecurity: No Food Insecurity (11/24/2023)   Hunger Vital Sign    Worried About Running Out of Food in the Last Year: Never true    Ran Out of Food in the Last Year: Never true  Transportation Needs: No Transportation Needs (11/24/2023)   PRAPARE - Administrator, Civil Service (Medical): No    Lack of Transportation (Non-Medical): No  Physical Activity: Sufficiently Active (11/24/2023)   Exercise Vital Sign    Days of Exercise per Week: 7 days    Minutes of Exercise per Session: 30 min  Stress: No Stress Concern Present (11/24/2023)   [employer] of Occupational Health - Occupational Stress Questionnaire    Feeling of Stress : Not at all  Social Connections: Moderately Isolated (11/24/2023)   Social Connection and Isolation Panel    Frequency of Communication with Friends and Family: More than three times a week    Frequency of Social Gatherings with Friends and Family: More than three times a week    Attends Religious Services: More than 4 times per year    Active Member  of Clubs or Organizations: No    Attends Banker Meetings: Never    Marital Status: Widowed  Intimate Partner Violence: Not At Risk (07/08/2023)   Humiliation, Afraid, Rape, and Kick questionnaire    Fear of Current or Ex-Partner: No    Emotionally Abused: No    Physically Abused: No    Sexually Abused: No    FH:  Family History  Problem Relation Age of Onset   Heart failure Mother    Heart attack Father    Heart attack Daughter     Past Medical History:  Diagnosis Date   Arrhythmia    atrial fibrillation   Congestive heart failure (CHF) (HCC)    Hypertension    Pacemaker 2015    Current Outpatient Medications  Medication  Sig Dispense Refill   albuterol  (VENTOLIN  HFA) 108 (90 Base) MCG/ACT inhaler Inhale 2 puffs into the lungs every 6 (six) hours as needed for wheezing or shortness of breath. 17 each 2   amLODipine  (NORVASC ) 5 MG tablet Take 1 tablet (5 mg total) by mouth daily. 90 tablet 1   atorvastatin  (LIPITOR) 10 MG tablet Take 1 tablet (10 mg total) by mouth daily. 90 tablet 1   benzonatate  (TESSALON ) 100 MG capsule Take 1 capsule (100 mg total) by mouth 2 (two) times daily as needed for cough. 20 capsule 0   calcium  carbonate (OS-CAL - DOSED IN MG OF ELEMENTAL CALCIUM ) 1250 (500 Ca) MG tablet Take 1 tablet by mouth 2 (two) times daily with a meal.     dipyridamole -aspirin  (AGGRENOX ) 200-25 MG 12hr capsule TAKE 1 CAPSULE BY MOUTH TWICE A DAY 180 capsule 0   JARDIANCE  10 MG TABS tablet TAKE 1 TABLET (10 MG TOTAL) BY MOUTH DAILY. NEEDS APPT FOR FURTHER REFILLS 90 tablet 2   loratadine (CLARITIN) 10 MG tablet Take 10 mg by mouth daily.     losartan  (COZAAR ) 50 MG tablet Take 1 tablet (50 mg total) by mouth 2 (two) times daily. 180 tablet 1   metolazone  (ZAROXOLYN ) 2.5 MG tablet Take 2.5 mg by mouth daily as needed.     metoprolol  (TOPROL -XL) 200 MG 24 hr tablet Take 1 tablet (200 mg total) by mouth daily. 90 tablet 0   Multiple Vitamins-Minerals (PRESERVISION AREDS 2 PO) Take 1 tablet by mouth in the morning and at bedtime.     multivitamin-iron-minerals-folic acid (CENTRUM) chewable tablet Chew 1 tablet by mouth daily.     Omega-3 Fatty Acids (FISH OIL) 1000 MG CAPS Take 1 capsule by mouth daily.     pantoprazole  (PROTONIX ) 20 MG tablet Take 1 tablet (20 mg total) by mouth daily. 30 tablet 2   potassium chloride  (KLOR-CON ) 10 MEQ tablet Take 1 tablet (10 mEq total) by mouth as needed. Only if she takes an additional dose of lasix  90 tablet 1   Spacer/Aero-Holding Chambers (OPTICHAMBER DIAMOND ) MISC For use with PRN inhaler 1 each 0   torsemide  (DEMADEX ) 20 MG tablet TAKE 2 TABLETS (40 MG TOTAL) BY MOUTH DAILY.  TAKE AN ADDITIONAL 20 MG AS NEEDED BASED ON DAILY WEIGHTS. 200 tablet 0   No current facility-administered medications for this visit.   Vitals:   06/14/24 1346  BP: (!) 95/55  Pulse: 85  SpO2: 90%  Weight: 120 lb 6 oz (54.6 kg)   Wt Readings from Last 3 Encounters:  06/14/24 120 lb 6 oz (54.6 kg)  05/19/24 113 lb (51.3 kg)  04/28/24 107 lb (48.5 kg)   Lab Results  Component  Value Date   CREATININE 1.35 (H) 06/14/2024   CREATININE 1.10 (H) 05/19/2024   CREATININE 1.03 (H) 03/16/2024     PHYSICAL EXAM:  General: Frail, elderly female in wheelchair. No resp difficulty. Sleepy but easily awakens HEENT: HOH Neck: supple, elevated JVD Cor: Regular rhythm, rate. No rubs, gallops or murmurs Lungs: faint rales in bilateral lower lobes Abdomen: soft, nontender, nondistended. Extremities: no cyanosis, clubbing, rash, 3+ pitting edema bilateral lower legs to knees Neuro: awake and oriented X2. Moves all 4 extremities w/o difficulty. Affect pleasant   ECG: not done  ReDs reading: 46 %, abnormal (see plan below)   ASSESSMENT & PLAN:  Chronic heart failure with preserved ejection fraction- - suspect due to atrial fibrillation - NYHA III - fluid up with worsening shortness of breath, weight gain, elevated ReDs and edema - weighing daily; reminded to call for an overnight weight gain of > 2 pounds or a weekly weight gain of > 5 pounds - weight up 7 pounds from last visit here 1 month ago - daughter says that she's given metolazone  daily for the last 5 days - ReDs today is 46% - will send for 80mg  IV lasix / 40meq PO potassium - BMET/ BNP today - may need higher daily torsemide  but will give IV lasix  today and re-address this in 2 days at OV - arrived in wheelchair and is more sleepy today - Echo 02/26/22: EF of 60-65% along with mild LVH, moderately elevated PA pressure, moderate MR, moderate/severe TR, mild/moderate AS and severe LAE.  - goes to an adult day care each day,  reports that she eats low sodium diet there - continue jardiance  10mg  daily - decrease losartan  to 25mg  daily due to hypotension - continue metoprolol  succinate 200mg  daily - continue torsemide  40mg  daily for now and extra 20mg  PRN/ potassium if takes extra torsemide  - saw cardiology, Dr. Darliss 07/24 - BNP 09/17/23 was 664.9  Atrial fibrillation - has pacemaker - device remotely interrogated, 22 months battery left, 94% paced - saw EP Marny) 06/23 - not on anticoagulation due to history of macular degeneration and fall risk - BMP 05/19/24 reviewed: sodium 136, potassium 4.4, creatinine 1.10 & GFR 46  3: Lymphedema- - stage 2 - stop amlodipine  due to edema and hypotension - wears compression socks daily; discussed making vascular referral for management of varicosities but she defers at this time - goes to an adult daycare daily M-F - saw wound center Liliana) 07/24 - saw PCP (Simmons-Robinson) 05/25 - right leg u/s negative for DVT 04/25   Return in 2 days, sooner pending lab results. If symptoms do not improve, may need to present to the ED.   Ellouise DELENA Class, FNP 06/14/24

## 2024-06-14 NOTE — Progress Notes (Signed)
 PHARMACY CONSULT NOTE - Hypertonic Saline  Pharmacy Consult for Hypertonic Saline Monitoring and Management  Recent Labs: Potassium (mmol/L)  Date Value  06/14/2024 4.4   Magnesium (mg/dL)  Date Value  89/96/7975 3.0 (H)   Calcium  (mg/dL)  Date Value  92/92/7974 8.8 (L)   Albumin (g/dL)  Date Value  95/98/7974 3.8   Sodium (mmol/L)  Date Value  06/14/2024 117 (LL)  03/16/2024 137   Assessment  [name] is a 89 y.o. female presenting with shortness of breath and hyponatremia. PMH significant for CHF, HTN, HLD, PMM due to sinus pause 2007, GERD, CKD-CVA, A-fib, blindness. Pharmacy has been consulted to monitor hypertonic saline (3%) infusion.  Pertinent medications: furosemide  40 mg IV BID  Baseline Labs: Serum Na 115, Serum Osm 277, Urine Na pending, Urine Osm pending  Goal of Therapy:  Increase in Na by 4-6 mEq/L in 4-6 hours Do not exceed increase in Na by 10-12 mEq/L in 24 hours  Monitoring:  Date Time Na Rate/Comment  07/07 1933 115 Baseline Na 07/07 1937   - 3% NaCl infusion started  Plan:  Initiate 3% saline at 50 mL/hr Check Na Q2H x2 then Q4H  Stop infusion if  Na increases > 4 mEq/L in first 2 hours Na increases > 6 mEq/L in first 4 hours Na increases > 6 mEq/L in 6 hours  Na increases > 8 mEq/L in 8 hours (give D5W bolus) Continue to monitor for signs of clinical improvement and recommendations per nephrology  Thank you for involving pharmacy in this patient's care.   Damien Napoleon, PharmD Clinical Pharmacist 06/14/2024 6:43 PM

## 2024-06-14 NOTE — Progress Notes (Signed)
 ReDS Vest / Clip - 06/14/24 1418       ReDS Vest / Clip   Station Marker A    Ruler Value 21    ReDS Value Range High volume overload    ReDS Actual Value 46

## 2024-06-14 NOTE — ED Triage Notes (Signed)
 Patient to ED via POV for abnormal lab. PT was sent from the heart failure clinic for a sodium of 114. Pt received IV lasix  around 3pm. States she has not had much urine output. PT states she is feeling SOB. Daughter reports more off balance than normal.

## 2024-06-14 NOTE — Patient Instructions (Signed)
 Medication Changes:  STOP Amlodipine   DECREASE Losartan  25mg  (1 tab) daily   Follow-Up in: Please follow up with the [facility] on Wednesday.  At the [facility], you and your health needs are our priority. We have a designated team specialized in the treatment of Heart Failure. This Care Team includes your primary Heart Failure Specialized Cardiologist (physician), Advanced Practice Providers (APPs- Physician Assistants and Nurse Practitioners), and Pharmacist who all work together to provide you with the care you need, when you need it.   You may see any of the following providers on your designated Care Team at your next follow up:  Dr. Toribio Fuel Dr. Ezra Shuck Dr. Ria Commander Dr. Odis Brownie Ellouise Class, FNP Jaun Bash, RPH-CPP  Please be sure to bring in all your medications bottles to every appointment.   Need to Contact Us :  If you have any questions or concerns before your next appointment please send us  a message through [place] or call our office at [phone].    TO LEAVE A MESSAGE FOR THE NURSE SELECT OPTION 2, PLEASE LEAVE A MESSAGE INCLUDING: YOUR NAME DATE OF BIRTH CALL BACK NUMBER REASON FOR CALL**this is important as we prioritize the call backs  YOU WILL RECEIVE A CALL BACK THE SAME DAY AS LONG AS YOU CALL BEFORE 4:00 PM

## 2024-06-14 NOTE — ED Provider Notes (Signed)
 [facility] Provider Note    Event Date/Time   First MD Initiated Contact with Patient 06/14/24 1733     (approximate)   History   Abnormal Lab   HPI  [name] is a 89 year old female with history of A-fib, sinus pauses s/p pacemaker, CHF presenting to the emergency department today for evaluation of hyponatremia.  Patient reports that she has had some increased shortness of breath over the past few weeks.  Family reports that they gave her metolazone  initially with improvement, but more recently has continued to have lower extremity swelling.  They have also been giving supplemental potassium.  I reviewed her heart failure clinic visit from earlier today.  There, patient reported worsening shortness of breath, lower extremity edema, being off balance.  Has been receiving increased metolazone  over the last few days given increased edema.  She was given 80 mg of IV Lasix  in the clinic and had several additional medication adjustments.  Labs from clinic did return with critical sodium of 114 for which patient was directed to the ER.       Physical Exam   Triage Vital Signs: ED Triage Vitals [06/14/24 1725]  Encounter Vitals Group     BP (!) 84/46     Girls Systolic BP Percentile      Girls Diastolic BP Percentile      Boys Systolic BP Percentile      Boys Diastolic BP Percentile      Pulse Rate 64     Resp 17     Temp 97.8 F (36.6 C)     Temp Source Oral     SpO2 95 %     Weight 120 lb (54.4 kg)     Height 4' 11 (1.499 m)     Head Circumference      Peak Flow      Pain Score 0     Pain Loc      Pain Education      Exclude from Growth Chart     Most recent vital signs: Vitals:   06/14/24 1900 06/14/24 1910  BP: (!) 103/58   Pulse: (!) 58   Resp: 16   Temp:  98.2 F (36.8 C)  SpO2: 96%      General: Awake, interactive  CV:  Regular rate, good peripheral perfusion.  Resp:  Mildly labored respirations, bibasilar  crackles Abd:  Nondistended.  Neuro:  Symmetric facial movement, fluid speech MSK:  Bilateral lower extremity pitting edema   ED Results / Procedures / Treatments   Labs (all labs ordered are listed, but only abnormal results are displayed) Labs Reviewed  BASIC METABOLIC PANEL WITH GFR - Abnormal; Notable for the following components:      Result Value   Sodium 117 (*)    Chloride 77 (*)    Glucose, Bld 117 (*)    BUN 98 (*)    Creatinine, Ser 1.31 (*)    Calcium  8.8 (*)    GFR, Estimated 37 (*)    Anion gap 17 (*)    All other components within normal limits  CBC - Abnormal; Notable for the following components:   RBC 3.76 (*)    HCT 35.8 (*)    All other components within normal limits  SODIUM - Abnormal; Notable for the following components:   Sodium 115 (*)    All other components within normal limits  SODIUM - Abnormal; Notable for the following components:   Sodium 116 (*)  All other components within normal limits  OSMOLALITY  PHOSPHORUS  SODIUM, URINE, RANDOM  OSMOLALITY, URINE  URINALYSIS, ROUTINE W REFLEX MICROSCOPIC  SODIUM  CBC  MAGNESIUM  BASIC METABOLIC PANEL WITH GFR  SODIUM     EKG EKG independently reviewed and interpreted by myself demonstrates:  EKG demonstrates a ventricularly paced rhythm at a rate of 61, QRS 158, QTc 475, no appreciable superimposed ischemia  RADIOLOGY Imaging independently reviewed and interpreted by myself demonstrates:  CXR without obvious focal consolidation, scattered opacities overall similar to prior, possibly slightly worsened  Formal Radiology Read:  [facility] Chest Port 1 View Result Date: 06/14/2024 CLINICAL DATA:  Shortness of breath EXAM: PORTABLE CHEST 1 VIEW COMPARISON:  03/17/2024 FINDINGS: Cardiac pacemaker. Cardiac enlargement. Peribronchial thickening and interstitial changes throughout the lungs suggesting fibrosis and bronchitic changes. No airspace disease or consolidation. No pleural effusion or  pneumothorax. Mediastinal contours appear intact. Calcified and tortuous aorta. Degenerative changes in the spine and shoulders. IMPRESSION: Chronic interstitial changes in the lungs.  No focal consolidation. Electronically Signed   By: Elsie Gravely M.D.   On: 06/14/2024 19:02    PROCEDURES:  Critical Care performed: Yes, see critical care procedure note(s)  CRITICAL CARE Performed by: Nilsa Dade   Total critical care time: 31 minutes  Critical care time was exclusive of separately billable procedures and treating other patients.  Critical care was necessary to treat or prevent imminent or life-threatening deterioration.  Critical care was time spent personally by me on the following activities: development of treatment plan with patient and/or surrogate as well as nursing, discussions with consultants, evaluation of patient's response to treatment, examination of patient, obtaining history from patient or surrogate, ordering and performing treatments and interventions, ordering and review of laboratory studies, ordering and review of radiographic studies, pulse oximetry and re-evaluation of patient's condition.   Procedures   MEDICATIONS ORDERED IN ED: Medications  ondansetron  (ZOFRAN ) injection 4 mg (has no administration in time range)  hydrALAZINE  (APRESOLINE ) injection 5 mg (has no administration in time range)  acetaminophen  (TYLENOL ) tablet 650 mg (has no administration in time range)  albuterol  (PROVENTIL ) (2.5 MG/3ML) 0.083% nebulizer solution 2.5 mg (has no administration in time range)  dextromethorphan-guaiFENesin  (MUCINEX  DM) 30-600 MG per 12 hr tablet 1 tablet (has no administration in time range)  sodium chloride  (hypertonic) 3 % solution ( Intravenous New Bag/Given 06/14/24 1937)  enoxaparin  (LOVENOX ) injection 30 mg (30 mg Subcutaneous Given 06/14/24 1937)  furosemide  (LASIX ) injection 40 mg (has no administration in time range)     IMPRESSION / MDM / ASSESSMENT AND  PLAN / ED COURSE  I reviewed the triage vital signs and the nursing notes.  Differential diagnosis includes, but is not limited to, CHF exacerbation, consideration for hypervolemic hyponatremia given increased lower extremity edema, also consideration for hypovolemic hyponatremia given increased diuretic use though patient clinically does not appear hypovolemic  Patient's presentation is most consistent with acute presentation with potential threat to life or bodily function.  89 year old female presenting to the emergency department for evaluation of hyponatremia in the setting of worsening shortness of breath and lower extremity edema.  Received IV Lasix  a few hours prior to ER presentation, will hold off on further diuresis for now.  Plan for admission for further management of her hyponatremia.  Clinical Course as of 06/14/24 2128  Mon Jun 14, 2024  [facility] Chest [place] 1 View CXR overall similar compared to prior, possible subtle increased density suggestive of edema.  Do note that  on prior from 03/17/2024, patient was noted to have stable densities with possible chronic interstitial disease or scarring. [NR]    Clinical Course User Index [NR] Levander Slate, MD   Case discussed with hospitalist team.  They will evaluate for anticipated admission.  FINAL CLINICAL IMPRESSION(S) / ED DIAGNOSES   Final diagnoses:  Hyponatremia     Rx / DC Orders   ED Discharge Orders     None        Note:  This document was prepared using Dragon voice recognition software and may include unintentional dictation errors.   Levander Slate, MD 06/14/24 2128

## 2024-06-14 NOTE — Telephone Encounter (Signed)
 Called per Kylie Class, FNP, for pt's critical low sodium level of 114. Spoke to daughter Lamarr. Lamarr agreeable to take pt to ED. No further questions at this time.

## 2024-06-15 ENCOUNTER — Encounter: Payer: Self-pay | Admitting: Internal Medicine

## 2024-06-15 ENCOUNTER — Inpatient Hospital Stay

## 2024-06-15 DIAGNOSIS — N1832 Chronic kidney disease, stage 3b: Secondary | ICD-10-CM

## 2024-06-15 DIAGNOSIS — E871 Hypo-osmolality and hyponatremia: Secondary | ICD-10-CM | POA: Diagnosis not present

## 2024-06-15 DIAGNOSIS — Z515 Encounter for palliative care: Secondary | ICD-10-CM

## 2024-06-15 DIAGNOSIS — E785 Hyperlipidemia, unspecified: Secondary | ICD-10-CM | POA: Diagnosis not present

## 2024-06-15 DIAGNOSIS — I5033 Acute on chronic diastolic (congestive) heart failure: Secondary | ICD-10-CM | POA: Diagnosis not present

## 2024-06-15 DIAGNOSIS — I10 Essential (primary) hypertension: Secondary | ICD-10-CM | POA: Diagnosis not present

## 2024-06-15 LAB — CBC
HCT: 35.9 % — ABNORMAL LOW (ref 36.0–46.0)
Hemoglobin: 12.5 g/dL (ref 12.0–15.0)
MCH: 33.7 pg (ref 26.0–34.0)
MCHC: 34.8 g/dL (ref 30.0–36.0)
MCV: 96.8 fL (ref 80.0–100.0)
Platelets: 186 K/uL (ref 150–400)
RBC: 3.71 MIL/uL — ABNORMAL LOW (ref 3.87–5.11)
RDW: 14 % (ref 11.5–15.5)
WBC: 3.5 K/uL — ABNORMAL LOW (ref 4.0–10.5)
nRBC: 0 % (ref 0.0–0.2)

## 2024-06-15 LAB — OSMOLALITY, URINE: Osmolality, Ur: 283 mosm/kg — ABNORMAL LOW (ref 300–900)

## 2024-06-15 LAB — MAGNESIUM: Magnesium: 2.3 mg/dL (ref 1.7–2.4)

## 2024-06-15 LAB — URINALYSIS, ROUTINE W REFLEX MICROSCOPIC
Bilirubin Urine: NEGATIVE
Glucose, UA: NEGATIVE mg/dL
Hgb urine dipstick: NEGATIVE
Ketones, ur: NEGATIVE mg/dL
Leukocytes,Ua: NEGATIVE
Nitrite: NEGATIVE
Protein, ur: NEGATIVE mg/dL
Specific Gravity, Urine: 1.006 (ref 1.005–1.030)
pH: 7 (ref 5.0–8.0)

## 2024-06-15 LAB — BASIC METABOLIC PANEL WITH GFR
Anion gap: 12 (ref 5–15)
BUN: 90 mg/dL — ABNORMAL HIGH (ref 8–23)
CO2: 27 mmol/L (ref 22–32)
Calcium: 8.7 mg/dL — ABNORMAL LOW (ref 8.9–10.3)
Chloride: 82 mmol/L — ABNORMAL LOW (ref 98–111)
Creatinine, Ser: 1.09 mg/dL — ABNORMAL HIGH (ref 0.44–1.00)
GFR, Estimated: 46 mL/min — ABNORMAL LOW (ref >60)
Glucose, Bld: 96 mg/dL (ref 70–99)
Potassium: 3.6 mmol/L (ref 3.5–5.1)
Sodium: 121 mmol/L — ABNORMAL LOW (ref 135–145)

## 2024-06-15 LAB — SODIUM
Sodium: 120 mmol/L — ABNORMAL LOW (ref 135–145)
Sodium: 125 mmol/L — ABNORMAL LOW (ref 135–145)
Sodium: 124 mmol/L — ABNORMAL LOW (ref 135–145)

## 2024-06-15 LAB — SODIUM, URINE, RANDOM: Sodium, Ur: 72 mmol/L

## 2024-06-15 MED ORDER — ATORVASTATIN CALCIUM 20 MG PO TABS
10.0000 mg | ORAL_TABLET | Freq: Every day | ORAL | Status: DC
  Administered 2024-06-15 – 2024-06-26 (×12): 10 mg via ORAL
  Filled 2024-06-15 (×12): qty 1

## 2024-06-15 MED ORDER — PANTOPRAZOLE SODIUM 20 MG PO TBEC
20.0000 mg | DELAYED_RELEASE_TABLET | Freq: Every day | ORAL | Status: DC
  Administered 2024-06-15 – 2024-06-17 (×3): 20 mg via ORAL
  Filled 2024-06-15 (×3): qty 1

## 2024-06-15 MED ORDER — METOPROLOL SUCCINATE ER 50 MG PO TB24
200.0000 mg | ORAL_TABLET | Freq: Every day | ORAL | Status: DC
  Administered 2024-06-15 – 2024-06-16 (×2): 200 mg via ORAL
  Filled 2024-06-15 (×2): qty 4

## 2024-06-15 NOTE — Consult Note (Signed)
 [facility]  CONSULT NOTE    Date: 06/15/2024                  Patient Name:  [name]  MRN: [id number]  DOB: 1925/11/06  Age / Sex: 89 y.o., female         PCP: Sharma Coyer, MD                 Service Requesting Consult: TRH                 Reason for Consult: Hyponatremia            History of Present Illness: [name] is a 89 y.o.  female with past medical conditions including hypertension, hyperlipidemia, GERD, atrial fibrillation, hard hearing, blindness, chronic kidney disease, who was admitted to [facility] on 06/14/2024 for Hyponatremia [E87.1] Hypernatremia [E87.0]  Patient presents to the emergency department with abnormal labs.  Patient severely hard of hearing so history obtained through patient and chart review.  No family present.  Patient states she was at another doctor's appointment.  States they instructed her to come to the emergency department due to low sodium levels.  Daughter stated that patient had increased altered gait.  Labs on ED arrival concerning for sodium 114, BUN 96, creatinine 1.35 with GFR 36.  UA appears clear.  Chest x-ray negative for acute findings.  Patient initiated on 3% hypertonic saline overnight.   Medications: Outpatient medications: (Not in a hospital admission)   Current medications: Current Facility-Administered Medications  Medication Dose Route Frequency Provider Last Rate Last Admin   acetaminophen  (TYLENOL ) tablet 650 mg  650 mg Oral Q6H PRN Niu, Xilin, MD       albuterol  (PROVENTIL ) (2.5 MG/3ML) 0.083% nebulizer solution 2.5 mg  2.5 mg Inhalation Q4H PRN Niu, Xilin, MD       atorvastatin  (LIPITOR) tablet 10 mg  10 mg Oral Daily Niu, Xilin, MD   10 mg at 06/15/24 1122   dextromethorphan-guaiFENesin  (MUCINEX  DM) 30-600 MG per 12 hr tablet 1 tablet  1 tablet Oral BID PRN Niu, Xilin, MD       enoxaparin  (LOVENOX ) injection 30 mg  30 mg Subcutaneous Q24H Niu, Xilin, MD   30 mg at 06/14/24  1937   furosemide  (LASIX ) injection 40 mg  40 mg Intravenous Q12H Niu, Xilin, MD   40 mg at 06/15/24 0325   hydrALAZINE  (APRESOLINE ) injection 5 mg  5 mg Intravenous Q2H PRN Niu, Xilin, MD       metoprolol  succinate (TOPROL -XL) 24 hr tablet 200 mg  200 mg Oral Daily Niu, Xilin, MD   200 mg at 06/15/24 1121   ondansetron  (ZOFRAN ) injection 4 mg  4 mg Intravenous Q8H PRN Niu, Xilin, MD       pantoprazole  (PROTONIX ) EC tablet 20 mg  20 mg Oral Daily Niu, Xilin, MD   20 mg at 06/15/24 1122   Current Outpatient Medications  Medication Sig Dispense Refill   acetaminophen  (TYLENOL ) 500 MG tablet Take 500 mg by mouth every 6 (six) hours as needed for moderate pain (pain score 4-6).     albuterol  (VENTOLIN  HFA) 108 (90 Base) MCG/ACT inhaler Inhale 2 puffs into the lungs every 6 (six) hours as needed for wheezing or shortness of breath. 17 each 2   atorvastatin  (LIPITOR) 10 MG tablet Take 1 tablet (10 mg total) by mouth daily. 90 tablet 1   calcium  carbonate (OS-CAL - DOSED IN MG OF ELEMENTAL CALCIUM ) 1250 (500  Ca) MG tablet Take 1 tablet by mouth 2 (two) times daily with a meal.     dipyridamole -aspirin  (AGGRENOX ) 200-25 MG 12hr capsule TAKE 1 CAPSULE BY MOUTH TWICE A DAY 180 capsule 0   JARDIANCE  10 MG TABS tablet TAKE 1 TABLET (10 MG TOTAL) BY MOUTH DAILY. NEEDS APPT FOR FURTHER REFILLS 90 tablet 2   loratadine (CLARITIN) 10 MG tablet Take 10 mg by mouth daily.     losartan  (COZAAR ) 25 MG tablet Take 1 tablet (25 mg total) by mouth daily. 30 tablet 3   metolazone  (ZAROXOLYN ) 2.5 MG tablet Take 2.5 mg by mouth daily as needed.     metoprolol  (TOPROL -XL) 200 MG 24 hr tablet Take 1 tablet (200 mg total) by mouth daily. 90 tablet 0   Multiple Vitamins-Minerals (PRESERVISION AREDS 2 PO) Take 1 tablet by mouth in the morning and at bedtime.     multivitamin-iron-minerals-folic acid (CENTRUM) chewable tablet Chew 1 tablet by mouth daily.     Omega-3 Fatty Acids (FISH OIL) 1000 MG CAPS Take 1 capsule by mouth  daily.     pantoprazole  (PROTONIX ) 20 MG tablet Take 1 tablet (20 mg total) by mouth daily. 30 tablet 2   potassium chloride  (KLOR-CON ) 10 MEQ tablet Take 1 tablet (10 mEq total) by mouth as needed. Only if she takes an additional dose of lasix  90 tablet 1   torsemide  (DEMADEX ) 20 MG tablet TAKE 2 TABLETS (40 MG TOTAL) BY MOUTH DAILY. TAKE AN ADDITIONAL 20 MG AS NEEDED BASED ON DAILY WEIGHTS. (Patient taking differently: Take 40-60 mg by mouth daily. Take two tablets (40mg ) daily. Take an additional 20 mg as needed based on daily weights.) 200 tablet 0   benzonatate  (TESSALON ) 100 MG capsule Take 1 capsule (100 mg total) by mouth 2 (two) times daily as needed for cough. (Patient not taking: Reported on 06/15/2024) 20 capsule 0   Spacer/Aero-Holding Chambers (OPTICHAMBER DIAMOND ) MISC For use with PRN inhaler 1 each 0      Allergies: Allergies  Allergen Reactions   Moxifloxacin Itching and Swelling    Swelling and itching of the eyes.   Tobramycin Itching and Swelling    Swelling and itching of the eyelids.   Codeine     Other reaction(s): Hallucination      Past Medical History: Past Medical History:  Diagnosis Date   Arrhythmia    atrial fibrillation   Congestive heart failure (CHF) (HCC)    Hypertension    Pacemaker 2015     Past Surgical History: Past Surgical History:  Procedure Laterality Date   ABDOMINAL HYSTERECTOMY     EYE SURGERY     MOHS SURGERY  2023   PACEMAKER PLACEMENT N/A 2007   2017     Family History: Family History  Problem Relation Age of Onset   Heart failure Mother    Heart attack Father    Heart attack Daughter      Social History: Social History   Socioeconomic History   Marital status: Widowed    Spouse name: Not on file   Number of children: Not on file   Years of education: Not on file   Highest education level: Some college, no degree  Occupational History   Not on file  Tobacco Use   Smoking status: Never   Smokeless tobacco:  Never  Substance and Sexual Activity   Alcohol use: Not Currently   Drug use: Never   Sexual activity: Not Currently  Other Topics Concern   Not  on file  Social History Narrative   Not on file   Social Drivers of Health   Financial Resource Strain: Low Risk  (11/24/2023)   Overall Financial Resource Strain (CARDIA)    Difficulty of Paying Living Expenses: Not hard at all  Food Insecurity: No Food Insecurity (11/24/2023)   Hunger Vital Sign    Worried About Running Out of Food in the Last Year: Never true    Ran Out of Food in the Last Year: Never true  Transportation Needs: No Transportation Needs (11/24/2023)   PRAPARE - Administrator, Civil Service (Medical): No    Lack of Transportation (Non-Medical): No  Physical Activity: Sufficiently Active (11/24/2023)   Exercise Vital Sign    Days of Exercise per Week: 7 days    Minutes of Exercise per Session: 30 min  Stress: No Stress Concern Present (11/24/2023)   [employer] of Occupational Health - Occupational Stress Questionnaire    Feeling of Stress : Not at all  Social Connections: Moderately Isolated (11/24/2023)   Social Connection and Isolation Panel    Frequency of Communication with Friends and Family: More than three times a week    Frequency of Social Gatherings with Friends and Family: More than three times a week    Attends Religious Services: More than 4 times per year    Active Member of [employer] or Organizations: No    Attends Banker Meetings: Never    Marital Status: Widowed  Intimate Partner Violence: Not At Risk (07/08/2023)   Humiliation, Afraid, Rape, and Kick questionnaire    Fear of Current or Ex-Partner: No    Emotionally Abused: No    Physically Abused: No    Sexually Abused: No     Review of Systems: Review of Systems  Constitutional:  Negative for chills, fever and malaise/fatigue.       Abnormal labs  HENT:  Positive for hearing loss. Negative for congestion, sore  throat and tinnitus.   Eyes:  Negative for blurred vision and redness.  Respiratory:  Negative for cough, shortness of breath and wheezing.   Cardiovascular:  Negative for chest pain, palpitations, claudication and leg swelling.  Gastrointestinal:  Negative for abdominal pain, blood in stool, diarrhea, nausea and vomiting.  Genitourinary:  Negative for flank pain, frequency and hematuria.  Musculoskeletal:  Negative for back pain, falls and myalgias.  Skin:  Negative for rash.  Neurological:  Negative for dizziness, weakness and headaches.  Endo/Heme/Allergies:  Does not bruise/bleed easily.  Psychiatric/Behavioral:  Negative for depression. The patient is not nervous/anxious and does not have insomnia.     Vital Signs: Blood pressure 113/70, pulse (!) 59, temperature 98.3 F (36.8 C), temperature source Oral, resp. rate (!) 21, height 4' 11 (1.499 m), weight 54.4 kg, SpO2 100%.  Weight trends: Filed Weights   06/14/24 1725  Weight: 54.4 kg    Physical Exam: General: NAD  Head: Normocephalic, atraumatic. Moist oral mucosal membranes, hard of hearing  Eyes: Anicteric  Neck: Supple  Lungs:  Clear to auscultation, normal effort  Heart: Regular rate and rhythm  Abdomen:  Soft, nontender, nondistended  Extremities: Trace peripheral edema.  Neurologic: Alert, moving all four extremities  Skin: No lesions  Access: None     Lab results: Basic Metabolic Panel: Recent Labs  Lab 06/14/24 1503 06/14/24 1731 06/14/24 1933 06/14/24 2057 06/15/24 0148 06/15/24 0434 06/15/24 0848  NA 114* 117* 115*   < > 120* 121* 125*  K 4.1 4.4  --   --   --  3.6  --   CL 76* 77*  --   --   --  82*  --   CO2 23 23  --   --   --  27  --   GLUCOSE 110* 117*  --   --   --  96  --   BUN 96* 98*  --   --   --  90*  --   CREATININE 1.35* 1.31*  --   --   --  1.09*  --   CALCIUM  8.6* 8.8*  --   --   --  8.7*  --   MG  --   --   --   --   --  2.3  --   PHOS  --   --  3.8  --   --   --   --    <  > = values in this interval not displayed.    Liver Function Tests: No results for input(s): AST, ALT, ALKPHOS, BILITOT, PROT, ALBUMIN in the last 168 hours. No results for input(s): LIPASE, AMYLASE in the last 168 hours. No results for input(s): AMMONIA in the last 168 hours.  CBC: Recent Labs  Lab 06/14/24 1731 06/15/24 0434  WBC 5.5 3.5*  HGB 12.7 12.5  HCT 35.8* 35.9*  MCV 95.2 96.8  PLT 206 186    Cardiac Enzymes: No results for input(s): CKTOTAL, CKMB, CKMBINDEX, TROPONINI in the last 168 hours.  BNP: Invalid input(s): POCBNP  CBG: No results for input(s): GLUCAP in the last 168 hours.  Microbiology: Results for orders placed or performed in visit on 04/24/22  Microscopic Examination     Status: None   Collection Time: 04/24/22  2:26 PM   Urine  Result Value Ref Range Status   WBC, UA 0-5 0 - 5 /hpf Final   RBC, Urine 0-2 0 - 2 /hpf Final   Epithelial Cells (non renal) None seen 0 - 10 /hpf Final   Bacteria, UA None seen None seen/Few Final    Coagulation Studies: No results for input(s): LABPROT, INR in the last 72 hours.  Urinalysis: Recent Labs    06/15/24 0117  COLORURINE STRAW*  LABSPEC 1.006  PHURINE 7.0  GLUCOSEU NEGATIVE  HGBUR NEGATIVE  BILIRUBINUR NEGATIVE  KETONESUR NEGATIVE  PROTEINUR NEGATIVE  NITRITE NEGATIVE  LEUKOCYTESUR NEGATIVE      Imaging: DG Chest Port 1 View Result Date: 06/14/2024 CLINICAL DATA:  Shortness of breath EXAM: PORTABLE CHEST 1 VIEW COMPARISON:  03/17/2024 FINDINGS: Cardiac pacemaker. Cardiac enlargement. Peribronchial thickening and interstitial changes throughout the lungs suggesting fibrosis and bronchitic changes. No airspace disease or consolidation. No pleural effusion or pneumothorax. Mediastinal contours appear intact. Calcified and tortuous aorta. Degenerative changes in the spine and shoulders. IMPRESSION: Chronic interstitial changes in the lungs.  No focal consolidation.  Electronically Signed   By: Elsie Gravely M.D.   On: 06/14/2024 19:02     Assessment & Plan: Ms. Eilee Schader is a 89 y.o.  female with past medical conditions including hypertension, hyperlipidemia, GERD, atrial fibrillation, hard hearing, blindness, chronic kidney disease, who was admitted to [facility] on 06/14/2024 for Hyponatremia [E87.1] Hypernatremia [E87.0]  Hyponatremia, etiology unclear at this time due to questionable mental status.  Sodium 114 on admission.  Agree with 3% hypertonic saline.  Due to rate of correction, will stop IV fluids for now.  Sodium 121.  Would consider additional workup to include CT chest without contrast to rule out lung mass however family has  decided to transition to comfort care.  2.  Chronic kidney disease stage III A.  Baseline appears to be 1.09 with GFR 46 on 12/01/2023.  Creatinine 1.35 on admission but has returned to baseline.  Will continue to monitor for now.      LOS: 1 Duanne Duchesne 7/8/20252:59 PM

## 2024-06-15 NOTE — TOC Initial Note (Signed)
 Transition of Care [name]) - Initial/Assessment Note    Patient Details  Name: [name] MRN: [id number] Date of Birth: 1925-04-29  Transition of Care [name]) CM/SW Contact:    Kylie DELENA Fischer, LCSW Phone Number: 06/15/2024, 2:51 PM  Clinical Narrative:                 SW met with pt bedside at ED along with adult children.  Pt gave verbal permission to speak with children.  Pt is hard of hearing so have to speak up and close to pt.  Pt currenlty lives with daughter- [name].  Seeking [name] resources.  SW provided list of potential providers locally : [name] - [name]  [address], [place], [place] [address]  [phone]  [name]  [address] [place], [place] [address]  [phone]  [name]  [address] [place], [place] [address]  [phone]  [name]  [address], [place], [place] [address]  [phone]  [name]  [phone]. Family will contact SW or ED staff of choice.  Family will transport pt home. DME in home; walker, shower bench, grab bars.  Pt is able to dress and bath self. Pt uses pull ups. No issues with medication adherence. PCP: Dr. Sharma [name] and [facility]. No safety or DV concerns. Ask about [name] information.  CM will provide handout. SW will continue to address concerns and monitor.     Barriers to Discharge: Continued Medical Work up   Patient Goals and CMS Choice     Choice offered to / list presented to : Patient, Adult Children      Expected Discharge Plan and Services In-house Referral: Clinical Social Work     Living arrangements for the past 2 months: Single Family Home                                      Prior Living Arrangements/Services Living arrangements for the past 2 months: Single Family Home Lives with:: Adult Children Patient language and need for  interpreter reviewed:: Yes Do you feel safe going back to the place where you live?: Yes      Need for Family Participation in Patient Care: Yes (Comment) (Pt family is actively involved in care) Care giver support system in place?: Yes (comment) Current home services: DME (has walker, shower bench, grab bars in home) Criminal Activity/Legal Involvement Pertinent to Current Situation/Hospitalization: No - Comment as needed  Activities of Daily Living      Permission Sought/Granted                  Emotional Assessment Appearance:: Appears stated age Attitude/Demeanor/Rapport: Engaged Affect (typically observed): Appropriate Orientation: : Oriented to Self, Oriented to Place, Oriented to  Time, Oriented to Situation   Psych Involvement: No (comment)  Admission diagnosis:  Hyponatremia [E87.1] Hypernatremia [E87.0] Patient Active Problem List   Diagnosis Date Noted   Hyponatremia 06/14/2024   Acute on chronic diastolic CHF (congestive heart failure) (HCC) 06/14/2024   HLD (hyperlipidemia) 06/14/2024   Chronic kidney disease, stage 3b (HCC) 06/14/2024   Impaired glucose regulation 12/05/2023   Acute bacterial bronchitis 12/05/2023   Annual physical exam 09/26/2022   Primary hypertension 03/27/2022   Paroxysmal atrial fibrillation (HCC) 02/05/2022   Acute on chronic combined systolic and diastolic CHF (congestive heart failure) (HCC)  02/05/2022   Pacemaker 2007   PCP:  Kylie Coyer, MD Pharmacy:   [facility] [phone] [name], [place] - [address] [place] [address] Phone: [phone] Fax: [phone]     Social Drivers of Health (SDOH) Social History: SDOH Screenings   Food Insecurity: No Food Insecurity (11/24/2023)  Housing: Low Risk  (11/24/2023)  Transportation Needs: No Transportation Needs (11/24/2023)  Utilities: Not At Risk (07/08/2023)  Alcohol Screen: Low Risk  (07/08/2023)  Depression (PHQ2-9): Medium Risk  (04/28/2024)  Financial Resource Strain: Low Risk  (11/24/2023)  Physical Activity: Sufficiently Active (11/24/2023)  Social Connections: Moderately Isolated (11/24/2023)  Stress: No Stress Concern Present (11/24/2023)  Tobacco Use: Low Risk  (06/14/2024)  Health Literacy: Adequate Health Literacy (07/08/2023)   SDOH Interventions:     Readmission Risk Interventions     No data to display

## 2024-06-15 NOTE — Progress Notes (Signed)
      06/15/24 1742  Wound 06/15/24 1737 Elbow Posterior;Right  Date First Assessed/Time First Assessed: 06/15/24 1737   Present on Original Admission: Yes  Location: Elbow  Location Orientation: Posterior;Right  Wound Image Images linked

## 2024-06-15 NOTE — Progress Notes (Signed)
 PHARMACY CONSULT NOTE - Hypertonic Saline  Pharmacy Consult for Hypertonic Saline Monitoring and Management  Recent Labs: Potassium (mmol/L)  Date Value  06/15/2024 3.6   Magnesium (mg/dL)  Date Value  92/91/7974 2.3   Calcium  (mg/dL)  Date Value  92/91/7974 8.7 (L)   Albumin (g/dL)  Date Value  95/98/7974 3.8   Phosphorus (mg/dL)  Date Value  92/92/7974 3.8   Sodium (mmol/L)  Date Value  06/15/2024 124 (L)  03/16/2024 137   Assessment  [name] is a 89 y.o. female presenting with shortness of breath and hyponatremia. PMH significant for CHF, HTN, HLD, PMM due to sinus pause 2007, GERD, CKD-CVA, A-fib, blindness. Pharmacy has been consulted to monitor hypertonic saline (3%) infusion.  Pertinent medications: furosemide  40 mg IV BID  Baseline Labs: Serum Na 115, Serum Osm 277, Urine Na pending, Urine Osm pending  Goal of Therapy:  Increase in Na by 4-6 mEq/L in 4-6 hours Do not exceed increase in Na by 10-12 mEq/L in 24 hours  Monitoring:  Date Time Na Rate/Comment  07/07 1933 115 Baseline Na 07/07 1937   - 3% NaCl infusion started 07/08 2057 116  07/08 0148 120  07/08 0434 121  07/08 0848 125 07/08 1420 124   Plan:  Spoke with nephrology and attending, patient transitioning to comfort will dc serial Sodium level   Yatziri Wainwright Rodriguez-Guzman PharmD, BCPS 06/15/2024 3:34 PM

## 2024-06-15 NOTE — TOC CM/SW Note (Signed)
 SW to handoff to heart failure team to follow up/ monitor pt.

## 2024-06-15 NOTE — IPAL (Signed)
  Interdisciplinary Goals of Care Family Meeting   Date carried out: 06/15/2024  Location of the meeting: Bedside  Member's involved: Physician and Family Member or next of kin Wanda/POA/daughter  Durable Power of Attorney or acting medical decision maker: Wanda/POA/daughter  Discussion: We discussed goals of care for [employer] .  Patient was active until about a month ago when she used to go to a daycare center.  Code status:   Code Status: Limited: Do not attempt resuscitation (DNR) -DNR-LIMITED -Do Not Intubate/DNI    Disposition: Home with Hospice versus home with palliative care depending on family wishes.  Both daughters are still deciding.  Palliative care consult  Time spent for the meeting: 35 minutes    Cresencio Fairly, MD  06/15/2024, 6:10 PM

## 2024-06-15 NOTE — Consult Note (Signed)
 Consultation Note Date: 06/15/2024 at 1130  Patient Name: [name]  DOB: 1925-06-24  MRN: [id number]  Age / Sex: 89 y.o., female  PCP: Sharma Coyer, MD Referring Physician: Maree Hue, MD  HPI/Patient Profile: 89 y.o. female  with past medical history of  dCHF, HTN, HLD, PMM due to sinus pause 2007, GERD, CKD-CVA, A-fib, blindness d/t macular degeneration, HOH  admitted on 06/14/2024 with shortness of breath and hyponatremia.  Clinical Assessment and Goals of Care: Extensive chart review completed prior to meeting patient including labs, vital signs, imaging, progress notes, orders, and available advanced directive documents from current and previous encounters. I then met with patient, her daughter [name], daughter [name], and son [name] with his wife at bedside in ED to discuss diagnosis prognosis, GOC, EOL wishes, disposition and options.  I introduced Palliative Medicine as specialized medical care for people living with serious illness. It focuses on providing relief from the symptoms and stress of a serious illness. The goal is to improve quality of life for both the patient and the family.  We discussed a brief life review of the patient. 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 As far as functional and nutritional status, she attends an adult day care daily and endorses walking 30 minutes/day up until about a month ago. Family at bedside says she has a healthy appetite and clears her plate every time. Patient shares she is a depression baby as reason for always eating everything on her plate.   We discussed patient's current illness and what it means in the larger context of patient's on-going co-morbidities.  I attempted to elicit values and goals of care important to the patient.  Family shares they are hopeful  patient can return home with care of daughter [name]. They are waiting to hear back from [facility] for decisions about transfer back home.   Additionally, discuss hospice service and philosophy, aging in place, and patient's wishes. Family shares they do not believe she is ready for hospice services at this time but would like to further consider their options.   Advance directives, concepts specific to code status, artificial feeding and hydration, and rehospitalization were considered and discussed. Patient and family endorse plan remains for DNR with limited interventions. Patient's NOK decision maker is daughter [name].    Discussed with patient/family the importance of continued conversation with family and the medical providers regarding overall plan of care and treatment options, ensuring decisions are within the context of the patient's values and GOCs.    Questions and concerns were addressed. The family was encouraged to call with questions or concerns. Family was appreciative of our discussion.   PMT will continue to follow and support. No change to plan of care at this time.   Primary Decision Maker PATIENT  Physical Exam Vitals reviewed.  HENT:     Head: Normocephalic.     Ears:     Comments: HOH Eyes:  Comments: blindness  Cardiovascular:     Rate and Rhythm: Normal rate.     Pulses: Normal pulses.  Pulmonary:     Effort: Pulmonary effort is normal.     Comments: Conversational dyspnea [place] in place Abdominal:     Palpations: Abdomen is soft.  Skin:    General: Skin is warm and dry.  Neurological:     Mental Status: She is alert and oriented to person, place, and time.  Psychiatric:        Mood and Affect: Mood normal.        Behavior: Behavior normal.     Palliative Assessment/Data: 50-60%     Thank you for this consult. Palliative medicine will continue to follow and assist holistically.   Time Total: 75 minutes  Time spent includes: Detailed review  of medical records (labs, imaging, vital signs), medically appropriate exam (mental status, respiratory, cardiac, skin), discussed with treatment team, counseling and educating patient, family and staff, documenting clinical information, medication management and coordination of care.  Signed by: [name] Gunner, DNP, FNP-BC Palliative Medicine   Please contact Palliative Medicine Team providers via [facility] for questions and concerns.

## 2024-06-15 NOTE — Progress Notes (Signed)
 PHARMACY CONSULT NOTE - Hypertonic Saline  Pharmacy Consult for Hypertonic Saline Monitoring and Management  Recent Labs: Potassium (mmol/L)  Date Value  06/15/2024 3.6   Magnesium (mg/dL)  Date Value  92/91/7974 2.3   Calcium  (mg/dL)  Date Value  92/91/7974 8.7 (L)   Albumin (g/dL)  Date Value  95/98/7974 3.8   Phosphorus (mg/dL)  Date Value  92/92/7974 3.8   Sodium (mmol/L)  Date Value  06/15/2024 125 (L)  03/16/2024 137   Assessment  [name] is a 89 y.o. female presenting with shortness of breath and hyponatremia. PMH significant for CHF, HTN, HLD, PMM due to sinus pause 2007, GERD, CKD-CVA, A-fib, blindness. Pharmacy has been consulted to monitor hypertonic saline (3%) infusion.  Pertinent medications: furosemide  40 mg IV BID  Baseline Labs: Serum Na 115, Serum Osm 277, Urine Na pending, Urine Osm pending  Goal of Therapy:  Increase in Na by 4-6 mEq/L in 4-6 hours Do not exceed increase in Na by 10-12 mEq/L in 24 hours  Monitoring:  Date Time Na Rate/Comment  07/07 1933 115 Baseline Na 07/07 1937   - 3% NaCl infusion started 07/08 2057 116  07/08 0148 120  07/08 0434 121  07/08 0848 125  Plan:  Spoke with nephrology and attending, will discontinue hypertonic saline infusion for now Continue Q4H checks Stop infusion if  Na increases > 4 mEq/L in first 2 hours Na increases > 6 mEq/L in first 4 hours Na increases > 6 mEq/L in 6 hours  Na increases > 8 mEq/L in 8 hours (give D5W bolus) Continue to monitor for signs of clinical improvement and recommendations per nephrology  Thank you for involving pharmacy in this patient's care.   Kimbly Eanes A Bryella Diviney, PharmD Clinical Pharmacist 06/15/2024 9:33 AM

## 2024-06-15 NOTE — ED Notes (Addendum)
 Attempted to removed Pt off oxygen.  O2 sat fluctuated between 86-95%.  Pt placed back on 1L [place].

## 2024-06-15 NOTE — ED Notes (Signed)
 Multiple staff attempted to obtain temp oral and axillary and w/ different equipment.  Unable to obtain temperature.

## 2024-06-15 NOTE — ED Notes (Signed)
 Placed on 2l [place] due to desating while sleeping

## 2024-06-15 NOTE — TOC CM/SW Note (Addendum)
 SW received message from Dr. Maree that pt decided on [facility] for hospice care. SW contacted [facility] at [phone].  Rep expressed that hospice hospital team will be following up with sw and family.  SW waiting on hospice hospital team for followup     [facility] - [facility]patient Brief Assessment   Patient Details  Name: [name] MRN: [id number] Date of Birth: 1925-05-15  Transition of Care [facility]) CM/SW Contact:    Edsel DELENA Fischer, LCSW Phone Number: 06/15/2024, 3:06 PM   Clinical Narrative:  SW added hospice resources to discharge plan  Transition of Care Asessment:

## 2024-06-15 NOTE — Progress Notes (Signed)
 1      PROGRESS NOTE    [name]  FMW:[id number] DOB: Apr 04, 1925 DOA: 06/14/2024 PCP: Sharma Coyer, MD    Brief Narrative:   89 y.o. female with medical history significant of dCHF, HTN, HLD, PMM due to sinus pause 2007, GERD, CKD-CVA, A-fib, blindness, HOH, who presents with SOB and abdominal labs with hyponatremia.   7/8: Nephrology, palliative care consult   Assessment & Plan:   Principal Problem:   Hyponatremia Active Problems:   Acute on chronic diastolic CHF (congestive heart failure) (HCC)   Primary hypertension   HLD (hyperlipidemia)   Paroxysmal atrial fibrillation (HCC)   Chronic kidney disease, stage 3b (HCC)   Hyponatremia:  Patient has hypervolemic hyponatremia. Her sodium is 117 in ED. s/p 3% hypertonic saline and IV Lasix .   -Hypertonic saline discontinued.  Nephrology following - Sodium improved from 114-> 124 -Will obtain CT chest without contrast to rule out underlying lung mass until family confirms hospice versus palliative care   Acute on chronic diastolic CHF (congestive heart failure) (HCC): 2D echo on 06/03/2023 showed EF 55 to 60%.  Patient has SOB, elevated BNP 770, positive JVD and increased interstitial opacity on chest x-ray, clinically consistent with CHF exacerbation. -Lasix  40 mg bid by IV as above -Daily weights -strict I/O's -Low salt diet -Fluid restriction -As needed bronchodilators for shortness of breath   Primary hypertension: -Hold amlodipine , Cozaar , metolazone  and torsemide  due to softer blood pressure. - Patient is on IV Lasix  as above - IV hydralazine  as needed   HLD (hyperlipidemia) -Lipitor   Paroxysmal atrial fibrillation [facility]): Patient is not taking anticoagulants currently due to history of macular degeneration and fall risk . Heart rate 60-80s -continue metoprolol  with holding parameters of SBP< 95    Chronic kidney disease, stage 3a (HCC): Recent baseline creatinine 1.10.  Her creatinine is 1.31, BUN 98,  GFR 37, kidney function is close to baseline - Hold Cozaar , metolazone  and torsemide  as above - Nephro seen  Goals of care As per daughter/Wanda/POA, patient has been active until about a month ago.  She was attending daycare center and walking about 30 minutes/day but for about a month now she is not doing much and staying at home.  She is open and interested in exploring possible hospice versus palliative approach to keep her comfortable at home.  Daughter shared that patient is blind and has hearing loss waking it very difficult to manage at times  DVT prophylaxis: (Lovenox  enoxaparin  (LOVENOX ) injection 30 mg Start: 06/14/24 1900     Code Status: (DNR Family Communication: Discussed with Wanda/POA/daughter at bedside.  Discussed with Kathryn/daughter over phone Disposition Plan: Possible discharge in next 2-3 days depending on clinical condition, nephrology workup and/or goals of care discussion   Consultants:  Nephrology Palliative care   Subjective:  Patient is eating her meal tray.  She is hard of hearing.  Daughter at bedside  Objective: Vitals:   06/15/24 1330 06/15/24 1400 06/15/24 1430 06/15/24 1744  BP: (!) 121/58 (!) 107/59 113/70 126/61  Pulse: (!) 59 (!) 59 (!) 59 60  Resp: 20 17 (!) 21 20  Temp:    98 F (36.7 C)  TempSrc:      SpO2: 100% 100% 100% 100%  Weight:      Height:        Intake/Output Summary (Last 24 hours) at 06/15/2024 1758 Last data filed at 06/15/2024 9147 Gross per 24 hour  Intake --  Output 1000 ml  Net -1000 ml  Filed Weights   06/14/24 1725  Weight: 54.4 kg    Examination:  General exam: Appears calm and comfortable, she is blind and has severe hearing loss in both ears Respiratory system: Clear to auscultation. Respiratory effort normal. Cardiovascular system: S1 & S2 heard, RRR.  Crackles at the bilateral bases.  No murmurs.2+ pedal edema. Gastrointestinal system: Abdomen is soft, benign Central nervous system: Alert and  oriented. No focal neurological deficits. Extremities: Symmetric 5 x 5 power. Skin: No rashes, lesions or ulcers Psychiatry: Judgement and insight appear normal. Mood & affect appropriate.     Data Reviewed: I have personally reviewed following labs and imaging studies  CBC: Recent Labs  Lab 06/14/24 1731 06/15/24 0434  WBC 5.5 3.5*  HGB 12.7 12.5  HCT 35.8* 35.9*  MCV 95.2 96.8  PLT 206 186   Basic Metabolic Panel: Recent Labs  Lab 06/14/24 1503 06/14/24 1731 06/14/24 1933 06/14/24 2057 06/15/24 0148 06/15/24 0434 06/15/24 0848 06/15/24 1420  NA 114* 117* 115* 116* 120* 121* 125* 124*  K 4.1 4.4  --   --   --  3.6  --   --   CL 76* 77*  --   --   --  82*  --   --   CO2 23 23  --   --   --  27  --   --   GLUCOSE 110* 117*  --   --   --  96  --   --   BUN 96* 98*  --   --   --  90*  --   --   CREATININE 1.35* 1.31*  --   --   --  1.09*  --   --   CALCIUM  8.6* 8.8*  --   --   --  8.7*  --   --   MG  --   --   --   --   --  2.3  --   --   PHOS  --   --  3.8  --   --   --   --   --      Radiology Studies: DG Chest Port 1 View Result Date: 06/14/2024 CLINICAL DATA:  Shortness of breath EXAM: PORTABLE CHEST 1 VIEW COMPARISON:  03/17/2024 FINDINGS: Cardiac pacemaker. Cardiac enlargement. Peribronchial thickening and interstitial changes throughout the lungs suggesting fibrosis and bronchitic changes. No airspace disease or consolidation. No pleural effusion or pneumothorax. Mediastinal contours appear intact. Calcified and tortuous aorta. Degenerative changes in the spine and shoulders. IMPRESSION: Chronic interstitial changes in the lungs.  No focal consolidation. Electronically Signed   By: Elsie Gravely M.D.   On: 06/14/2024 19:02        Scheduled Meds:  atorvastatin   10 mg Oral Daily   enoxaparin  (LOVENOX ) injection  30 mg Subcutaneous Q24H   furosemide   40 mg Intravenous Q12H   metoprolol   200 mg Oral Daily   pantoprazole   20 mg Oral Daily   Continuous  Infusions:   LOS: 1 day    Time spent: 30 minutes    Cresencio Fairly, MD [facility] Pager [phone]  If 7PM-7AM, please contact night-coverage [email or web address] Password [facility] 06/15/2024, 5:58 PM

## 2024-06-15 NOTE — Progress Notes (Addendum)
 [facility] Liaison Note   Received referral from [facility], Edsel Fischer, LCSW, to meet with patient and family at bedside to explain hospice and palliative care services as they relate to care in the home.  Met patient/family at bedside and provided extensive education for hospice services and palliative care services.    Patent/family requested additional time to consider options before proceeding. All questions answered and no concerns voiced.   [facility] information and contact numbers given to family & above information shared with TOC.   Please call with any questions/concerns.    Thank you for the opportunity to participate in this patient's care.  [facility] Liaison [phone]

## 2024-06-16 ENCOUNTER — Encounter: Admitting: Family

## 2024-06-16 DIAGNOSIS — E871 Hypo-osmolality and hyponatremia: Secondary | ICD-10-CM | POA: Diagnosis not present

## 2024-06-16 DIAGNOSIS — I5033 Acute on chronic diastolic (congestive) heart failure: Secondary | ICD-10-CM | POA: Diagnosis not present

## 2024-06-16 DIAGNOSIS — N1832 Chronic kidney disease, stage 3b: Secondary | ICD-10-CM | POA: Diagnosis not present

## 2024-06-16 DIAGNOSIS — Z515 Encounter for palliative care: Secondary | ICD-10-CM | POA: Diagnosis not present

## 2024-06-16 LAB — BASIC METABOLIC PANEL WITH GFR
Anion gap: 10 (ref 5–15)
BUN: 79 mg/dL — ABNORMAL HIGH (ref 8–23)
CO2: 32 mmol/L (ref 22–32)
Calcium: 8.9 mg/dL (ref 8.9–10.3)
Chloride: 85 mmol/L — ABNORMAL LOW (ref 98–111)
Creatinine, Ser: 1.14 mg/dL — ABNORMAL HIGH (ref 0.44–1.00)
GFR, Estimated: 44 mL/min — ABNORMAL LOW (ref >60)
Glucose, Bld: 107 mg/dL — ABNORMAL HIGH (ref 70–99)
Potassium: 4.1 mmol/L (ref 3.5–5.1)
Sodium: 127 mmol/L — ABNORMAL LOW (ref 135–145)

## 2024-06-16 NOTE — Plan of Care (Signed)

## 2024-06-16 NOTE — Evaluation (Signed)
 Physical Therapy Evaluation Patient Details Name: [name] MRN: [id number] DOB: 07-06-1925 Today's Date: 06/16/2024  History of Present Illness  [name] is a 89yoF who comes to [facility] on 06/14/24  c SOB. PMH: CHF, HTN, HLD, CKD, CVA, AF, blindness, HOH. DTR reports recent LEE as well. In ED pt with intermittent confusion, fatigue, imbalance- workup revealing of Na+ 114, BP 84/62mmHg.  Clinical Impression   First day out of bed for [name] and boy is she glad. Pt moved to room air for session which is tolerated well, however sats reading are limited due to cold fingers. Pt requires no physical assist for mobility, but she is generally stiff and uses moderate effort to perform these. 1 brief episode of dizziness upon initiating AMB, improves shortly thereafter, however her BP at end of session remains lower than what I would consider safe for mobility. 80s/40s. Pt has 2 LOB while AMB, no assist needed for correction. DTR reports pt falls frequently at home for years now, often unwitnessed visually but heard audibly. Will continue to follow and facilitate baseline mobility patterns as patient is fairly active at baseline. DTR in room for entire visit.       If plan is discharge home, recommend the following: A little help with walking and/or transfers;Direct supervision/assist for medications management;Assist for transportation;Assistance with cooking/housework;Help with stairs or ramp for entrance   Can travel by private vehicle        Equipment Recommendations None recommended by PT  Recommendations for Other Services       Functional Status Assessment Patient has had a recent decline in their functional status and demonstrates the ability to make significant improvements in function in a reasonable and predictable amount of time.     Precautions / Restrictions Precautions Precautions: Fall Recall of Precautions/Restrictions: Intact Restrictions Weight Bearing Restrictions Per Provider  Order: No      Mobility  Bed Mobility Overal bed mobility: Needs Assistance Bed Mobility: Supine to Sit     Supine to sit: HOB elevated, Supervision          Transfers Overall transfer level: Needs assistance Equipment used: Rolling walker (2 wheels) Transfers: Sit to/from Stand Sit to Stand: Contact guard assist           General transfer comment: twice from EOB, fairly stiff, improves with timel;    Ambulation/Gait Ambulation/Gait assistance: Contact guard assist Gait Distance (Feet): 50 Feet Assistive device: Rolling walker (2 wheels) Gait Pattern/deviations: Step-to pattern       General Gait Details: clear limitations in confidence/speed imposed by VI patient in unfamiliar environment with unfamiliar device, although follows VC fairly well for navigation; is dizzy upon starting, improves with time, subsequently hypotensive while seated. AMB on room air without any complaint of dyspnea, 92% when checked later. (2 LOB while AMB)  Stairs            Wheelchair Mobility     Tilt Bed    Modified Rankin (Stroke Patients Only)       Balance                                             Pertinent Vitals/Pain Pain Assessment Pain Assessment: No/denies pain    Home Living Family/patient expects to be discharged to:: (P) Private residence Living Arrangements: (P) Children (with DTR Comer (part time work as Runner, broadcasting/film/video) Pt was going daily  to a day program until 3) Available Help at Discharge: Family Type of Home: (P) House Home Access: (P) Stairs to enter Entrance Stairs-Rails: (P) None Entrance Stairs-Number of Steps: (P) 5 STE, no hand rail Alternate Level Stairs-Number of Steps: (P) bedroom is on 1st floor Home Layout: (P) Two level Home Equipment: (P) Tub bench (3-wheel walker (used for years)) Additional Comments: takes seated showers in tub/shower combo, completed showers with supervision from daughter    Prior Function Prior  Level of Function : (P) Independent/Modified Independent             Mobility Comments: (P) 3WW, reports walking for about 30 minutes morning inside daily; has been falling twice monthly for >10 years ADLs Comments: (P) Mod I with ADL, supervision with seated showers but prefers to be aloe, sits on commode and washes self with basin.     Extremity/Trunk Assessment   Upper Extremity Assessment Upper Extremity Assessment: Generalized weakness    Lower Extremity Assessment Lower Extremity Assessment: Defer to PT evaluation       Communication   Communication Communication: No apparent difficulties Factors Affecting Communication: Hearing impaired    Cognition Arousal: Alert Behavior During Therapy: WFL for tasks assessed/performed   PT - Cognitive impairments: No apparent impairments                                 Cueing       General Comments General comments (skin integrity, edema, etc.): edema in BLEs    Exercises     Assessment/Plan    PT Assessment Patient needs continued PT services  PT Problem List Decreased strength;Decreased range of motion;Decreased activity tolerance;Decreased balance;Decreased mobility       PT Treatment Interventions DME instruction;Gait training;Stair training;Functional mobility training;Therapeutic activities;Therapeutic exercise;Balance training    PT Goals (Current goals can be found in the Care Plan section)  Acute Rehab PT Goals Patient Stated Goal: get back to walkin' daily PT Goal Formulation: With patient Time For Goal Achievement: 06/30/24 Potential to Achieve Goals: Good    Frequency Min 2X/week     Co-evaluation               AM-PAC PT 6 Clicks Mobility  Outcome Measure Help needed turning from your back to your side while in a flat bed without using bedrails?: A Little Help needed moving from lying on your back to sitting on the side of a flat bed without using bedrails?: A Little Help  needed moving to and from a bed to a chair (including a wheelchair)?: A Little Help needed standing up from a chair using your arms (e.g., wheelchair or bedside chair)?: A Little Help needed to walk in hospital room?: A Little Help needed climbing 3-5 steps with a railing? : A Little 6 Click Score: 18    End of Session Equipment Utilized During Treatment: Oxygen Activity Tolerance: Patient tolerated treatment well;No increased pain;Patient limited by fatigue Patient left: in bed;with family/visitor present;with nursing/sitter in room;with call bell/phone within reach   PT Visit Diagnosis: Unsteadiness on feet (R26.81);Repeated falls (R29.6);Other abnormalities of gait and mobility (R26.89)    Time: 8862-8842 PT Time Calculation (min) (ACUTE ONLY): 20 min   Charges:   PT Evaluation $PT Eval Moderate Complexity: 1 Mod   PT General Charges $$ ACUTE PT VISIT: 1 Visit        12:45 PM, 06/16/24 Peggye JAYSON Linear, PT, DPT Physical Therapist - [facility]  [phone] (ASCOM)    Nalah Macioce C 06/16/2024, 12:30 PM

## 2024-06-16 NOTE — Evaluation (Signed)
 Occupational Therapy Evaluation Patient Details Name: [name] MRN: [id number] DOB: 18-Oct-1925 Today's Date: 06/16/2024   History of Present Illness   [name] is a 89 y.o. female with medical history significant of dCHF, HTN, HLD, PMM due to sinus pause 2007, GERD, CKD-CVA, A-fib, blindness, HOH, who presents with SOB and abdominal labs with hyponatremia.     Clinical Impressions Pt was seen for OT evaluation this date. Prior to hospital admission, pt was Mod I with ADLs (supervision for showers in tub/shower combo with tub bench), uses RW for ambulation. Pt lives with daughter in a 2 level house, patient reports her bedroom is on 1st floor.  Has +5 STE without hand rails (patient reports she uses the wall and window sill/door frame to navigate the steps). Pt presents to acute OT demonstrating impaired ADL performance and functional mobility 2/2 functional weakness, decreased activity tolerance, increased fall risk (See OT problem list for additional functional deficits). Pt currently requires Mod A for LB ADLs while seated, Mod A for supine to sit, Min A for rolling R/L, Min to Mod  A for sit to stand transfers at RW level, completed static standing at RW with Min A, requires assistance for orientation of set-up of tasks due to blindness and is very HOH (has hearing aides).  Pt would benefit from skilled OT services to address noted impairments and functional limitations (see below for any additional details) in order to maximize safety and independence while minimizing falls risk and caregiver burden.  Call button was recommended to be switched out to a simple push button due to patient being low vision with difficulty consistently accessing.   At end of session, returned to supine with HOB elevated due to difficulty with bladder control even with purewick.  NT present and educated on current mobility status. Call button in place and bed alarm activated.      If plan is discharge home,  recommend the following:   A little help with walking and/or transfers;A little help with bathing/dressing/bathroom;Help with stairs or ramp for entrance     Functional Status Assessment   Patient has had a recent decline in their functional status and demonstrates the ability to make significant improvements in function in a reasonable and predictable amount of time.     Equipment Recommendations   BSC/3in1     Recommendations for Other Services         Precautions/Restrictions   Precautions Precautions: Fall Recall of Precautions/Restrictions: Intact Precaution/Restrictions Comments: impairment with safety awareness Restrictions Weight Bearing Restrictions Per Provider Order: No     Mobility Bed Mobility Overal bed mobility: Needs Assistance Bed Mobility: Rolling, Supine to Sit Rolling: Min assist   Supine to sit: Mod assist          Transfers Overall transfer level: Needs assistance Equipment used: Rolling walker (2 wheels) Transfers: Sit to/from Stand Sit to Stand: Mod assist                  Balance Overall balance assessment: Needs assistance Sitting-balance support: Single extremity supported Sitting balance-Leahy Scale: Fair       Standing balance-Leahy Scale: Poor                             ADL either performed or assessed with clinical judgement   ADL Overall ADL's : Needs assistance/impaired Eating/Feeding: Set up Eating/Feeding Details (indicate cue type and reason): Set-up only due to visual deficits Grooming: Wash/dry  face;Supervision/safety Grooming Details (indicate cue type and reason): Supervision for task following set-up with orientation of objects Upper Body Bathing: Supervision/ safety Upper Body Bathing Details (indicate cue type and reason): seated Lower Body Bathing: Moderate assistance;Sit to/from stand   Upper Body Dressing : Supervision/safety Upper Body Dressing Details (indicate cue type and  reason): seated Lower Body Dressing: Moderate assistance;Sit to/from stand Lower Body Dressing Details (indicate cue type and reason): simulated Toilet Transfer: Moderate assistance Toilet Transfer Details (indicate cue type and reason): simulated Toileting- Clothing Manipulation and Hygiene: Maximal assistance Toileting - Clothing Manipulation Details (indicate cue type and reason): simulated             Vision Baseline Vision/History: 2 Legally blind Ability to See in Adequate Light: 3 Highly impaired Patient Visual Report: No change from baseline       Perception         Praxis         Pertinent Vitals/Pain Pain Assessment Pain Assessment: No/denies pain     Extremity/Trunk Assessment Upper Extremity Assessment Upper Extremity Assessment: Generalized weakness   Lower Extremity Assessment Lower Extremity Assessment: Defer to PT evaluation       Communication Communication Communication: No apparent difficulties Factors Affecting Communication: Hearing impaired   Cognition Arousal: Alert Behavior During Therapy: WFL for tasks assessed/performed Cognition: No apparent impairments                               Following commands: Intact       Cueing  General Comments   Cueing Techniques: Verbal cues;Tactile cues  edema in BLEs   Exercises Other Exercises Other Exercises: Education on role of OT, education on functional transfers and safety for hand/foot placement to reduce fall risk, education on use of textured call button - patient had difficulty and therapist recommended to nursing staff to have a simple push call button which staff located and reported they would install.   Shoulder Instructions      Home Living Family/patient expects to be discharged to:: Private residence Living Arrangements: Children Available Help at Discharge: Family Type of Home: House Home Access: Stairs to enter [employer] of Steps: 5 STE, no  hand rail Entrance Stairs-Rails: None Home Layout: Two level Alternate Level Stairs-Number of Steps: patient reports bedroom is on 1st floor   Bathroom Shower/Tub: Chief Strategy Officer: Standard     Home Equipment: Agricultural consultant (2 wheels)   Additional Comments: takes seated showers in tub/shower combo, completed showers with supervision from daughter      Prior Functioning/Environment Prior Level of Function : Independent/Modified Independent             Mobility Comments: RW, reports walking for about 30 minutes per day ADLs Comments: Mod I with ADLs, Supervision with seated showers    OT Problem List: Decreased strength;Decreased coordination;Decreased activity tolerance;Decreased safety awareness;Impaired balance (sitting and/or standing)   OT Treatment/Interventions: Self-care/ADL training;Therapeutic exercise;Therapeutic activities;DME and/or AE instruction;Patient/family education;Balance training      OT Goals(Current goals can be found in the care plan section)   Acute Rehab OT Goals Patient Stated Goal: return home OT Goal Formulation: With patient Time For Goal Achievement: 06/30/24 Potential to Achieve Goals: Good ADL Goals Pt Will Perform Grooming: with supervision;standing;with modified independence Pt Will Perform Lower Body Bathing: with supervision;sit to/from stand Pt Will Perform Lower Body Dressing: with supervision;sit to/from stand Pt Will Transfer to Toilet: with supervision;stand pivot  transfer Pt Will Perform Toileting - Clothing Manipulation and hygiene: with supervision;sit to/from stand   OT Frequency:  Min 2X/week    Co-evaluation              AM-PAC OT 6 Clicks Daily Activity     Outcome Measure Help from another person eating meals?: A Little Help from another person taking care of personal grooming?: A Little Help from another person toileting, which includes using toliet, bedpan, or urinal?: A Lot Help from  another person bathing (including washing, rinsing, drying)?: A Lot Help from another person to put on and taking off regular upper body clothing?: A Little Help from another person to put on and taking off regular lower body clothing?: A Lot 6 Click Score: 15   End of Session Equipment Utilized During Treatment: Gait belt;Rolling walker (2 wheels) Nurse Communication: Mobility status  Activity Tolerance: Patient tolerated treatment well Patient left: in bed;with call bell/phone within reach;with bed alarm set;with nursing/sitter in room  OT Visit Diagnosis: Unsteadiness on feet (R26.81);Muscle weakness (generalized) (M62.81);History of falling (Z91.81)                Time: 9145-9059 OT Time Calculation (min): 46 min Charges:  OT General Charges $OT Visit: 1 Visit OT Evaluation $OT Eval Moderate Complexity: 1 Mod  Jencarlo Bonadonna OTR/L   [name] 06/16/2024, 10:15 AM

## 2024-06-16 NOTE — Progress Notes (Signed)
 [facility]  ROUNDING NOTE   Subjective:   Patient seen laying in bed No family present Ill-appearing  Objective:  Vital signs in last 24 hours:  Temp:  [96 F (35.6 C)-98.3 F (36.8 C)] 97.4 F (36.3 C) (07/09 1206) Pulse Rate:  [60-76] 62 (07/09 1206) Resp:  [16-20] 18 (07/09 1206) BP: (83-126)/(53-67) 83/53 (07/09 1206) SpO2:  [92 %-100 %] 92 % (07/09 1206) Weight:  [46.6 kg-55.2 kg] 55.2 kg (07/09 0500)  Weight change: -7.832 kg Filed Weights   06/14/24 1725 06/15/24 1744 06/16/24 0500  Weight: 54.4 kg 46.6 kg 55.2 kg    Intake/Output: I/O last 3 completed shifts: In: -  Out: 1700 [Urine:1700]   Intake/Output this shift:  No intake/output data recorded.  Physical Exam: General: Chronically ill-appearing  Head: Normocephalic, atraumatic. Moist oral mucosal membranes  Eyes: Anicteric  Neck: Supple  Lungs:  Clear to auscultation, normal effort  Heart: Regular rate and rhythm  Abdomen:  Soft, nontender  Extremities: No peripheral edema.  Neurologic: Awake, alert  Skin: Warm,dry, no rash       Basic Metabolic Panel: Recent Labs  Lab 06/14/24 1503 06/14/24 1731 06/14/24 1933 06/14/24 2057 06/15/24 0148 06/15/24 0434 06/15/24 0848 06/15/24 1420 06/16/24 1024  NA 114* 117* 115*   < > 120* 121* 125* 124* 127*  K 4.1 4.4  --   --   --  3.6  --   --  4.1  CL 76* 77*  --   --   --  82*  --   --  85*  CO2 23 23  --   --   --  27  --   --  32  GLUCOSE 110* 117*  --   --   --  96  --   --  107*  BUN 96* 98*  --   --   --  90*  --   --  79*  CREATININE 1.35* 1.31*  --   --   --  1.09*  --   --  1.14*  CALCIUM  8.6* 8.8*  --   --   --  8.7*  --   --  8.9  MG  --   --   --   --   --  2.3  --   --   --   PHOS  --   --  3.8  --   --   --   --   --   --    < > = values in this interval not displayed.    Liver Function Tests: No results for input(s): AST, ALT, ALKPHOS, BILITOT, PROT, ALBUMIN in the last 168 hours. No results for  input(s): LIPASE, AMYLASE in the last 168 hours. No results for input(s): AMMONIA in the last 168 hours.  CBC: Recent Labs  Lab 06/14/24 1731 06/15/24 0434  WBC 5.5 3.5*  HGB 12.7 12.5  HCT 35.8* 35.9*  MCV 95.2 96.8  PLT 206 186    Cardiac Enzymes: No results for input(s): CKTOTAL, CKMB, CKMBINDEX, TROPONINI in the last 168 hours.  BNP: Invalid input(s): POCBNP  CBG: No results for input(s): GLUCAP in the last 168 hours.  Microbiology: Results for orders placed or performed in visit on 04/24/22  Microscopic Examination     Status: None   Collection Time: 04/24/22  2:26 PM   Urine  Result Value Ref Range Status   WBC, UA 0-5 0 - 5 /hpf Final   RBC, Urine 0-2  0 - 2 /hpf Final   Epithelial Cells (non renal) None seen 0 - 10 /hpf Final   Bacteria, UA None seen None seen/Few Final    Coagulation Studies: No results for input(s): LABPROT, INR in the last 72 hours.  Urinalysis: Recent Labs    06/15/24 0117  COLORURINE STRAW*  LABSPEC 1.006  PHURINE 7.0  GLUCOSEU NEGATIVE  HGBUR NEGATIVE  BILIRUBINUR NEGATIVE  KETONESUR NEGATIVE  PROTEINUR NEGATIVE  NITRITE NEGATIVE  LEUKOCYTESUR NEGATIVE      Imaging: CT CHEST WO CONTRAST Result Date: 06/15/2024 CLINICAL DATA:  Severe hyponatremia. Concern for underlying lung cancer. EXAM: CT CHEST WITHOUT CONTRAST TECHNIQUE: Multidetector CT imaging of the chest was performed following the standard protocol without IV contrast. RADIATION DOSE REDUCTION: This exam was performed according to the departmental dose-optimization program which includes automated exposure control, adjustment of the mA and/or kV according to patient size and/or use of iterative reconstruction technique. COMPARISON:  Chest radiograph 06/14/2024, 03/17/2024, in 02/03/2022 FINDINGS: Cardiovascular: Cardiac enlargement. No pericardial effusions. Cardiac pacemaker. Calcification in the mitral valve annulus, aortic, and coronary arteries.  No aortic aneurysm. Mediastinum/Nodes: Thyroid gland is unremarkable. Esophagus is moderately distended and fluid-filled with air-fluid level down to the mid/distal esophageal region. No discrete obstructing lesion is identified. Changes most likely to represent dysmotility but underlying stricture is not excluded. Consider follow-up versus endoscopy. Scattered mediastinal lymph nodes are not pathologically enlarged. Lungs/Pleura: Small bilateral pleural effusions. Diffuse interstitial pattern to the lungs most prominent in the periphery and bases with some associated honeycomb changes and bronchiectasis. 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 No superimposed consolidation or focal mass lesion identified. No pneumothorax. Upper Abdomen: No acute abnormalities. Musculoskeletal: Soft tissue edema in the chest wall and visualized upper abdominal wall. Degenerative changes in the spine and shoulders. Compression of T7 vertebra. This is unchanged since prior chest radiograph of 03/17/2024. No acute bony abnormalities. IMPRESSION: 1. Cardiac enlargement. 2. Small bilateral pleural effusions, greater on the right. 3. Diffuse interstitial pattern to the lungs with some bronchiectasis and honeycomb changes likely representing usual interstitial pneumonitis or other chronic interstitial lung disease. 4. Upper esophageal dilatation with air-fluid level. No obstructing mass lesion is identified. Changes likely represent dysmotility although a stricture or occult obstructing lesion is not excluded. Consider follow-up versus endoscopy. 5. Aortic atherosclerosis. Electronically Signed   By: Elsie Gravely M.D.   On: 06/15/2024 20:07   DG Chest Port 1 View Result Date: 06/14/2024 CLINICAL DATA:  Shortness of breath EXAM: PORTABLE CHEST 1 VIEW COMPARISON:  03/17/2024 FINDINGS: Cardiac pacemaker. Cardiac enlargement. Peribronchial thickening and interstitial changes throughout the lungs suggesting fibrosis  and bronchitic changes. No airspace disease or consolidation. No pleural effusion or pneumothorax. Mediastinal contours appear intact. Calcified and tortuous aorta. Degenerative changes in the spine and shoulders. IMPRESSION: Chronic interstitial changes in the lungs.  No focal consolidation. Electronically Signed   By: Elsie Gravely M.D.   On: 06/14/2024 19:02     Medications:     atorvastatin   10 mg Oral Daily   enoxaparin  (LOVENOX ) injection  30 mg Subcutaneous Q24H   furosemide   40 mg Intravenous Q12H   metoprolol   200 mg Oral Daily   pantoprazole   20 mg Oral Daily   acetaminophen , albuterol , dextromethorphan-guaiFENesin , hydrALAZINE , ondansetron  (ZOFRAN ) IV  Assessment/ Plan:  [name] is a 89 y.o.  female with past medical conditions including hypertension, hyperlipidemia, GERD, atrial fibrillation, hard hearing, blindness, chronic kidney disease, who was admitted to [facility] on 06/14/2024 for Hypernatremia [E87.0]  Hyponatremia [E87.1]   Hyponatremia, etiology unclear at this time due to questionable mental status.  Sodium 114 on admission. Did receive 3% hypertonic saline during this admission. Sodium 124 this morning. CT scan negative for lung malignancy. Will continue to monitor labs.   Chronic kidney disease stage III A.  Baseline appears to be 1.09 with GFR 46 on 12/01/2023.  Creatinine 1.35 on admission but has returned to baseline.       LOS: 2 Storey Stangeland 7/9/20253:33 PM

## 2024-06-16 NOTE — Progress Notes (Signed)
 Progress Note   Patient: [name] FMW:[id number] DOB: Jan 01, 1925 DOA: 06/14/2024     2 DOS: the patient was seen and examined on 06/16/2024   Brief hospital course: 89 y.o. female with medical history significant of dCHF, HTN, HLD, PMM due to sinus pause 2007, GERD, CKD-CVA, A-fib, blindness, HOH, who presents with SOB and abnormal labs with hyponatremia, initial sodium level 114.  Patient was admitted with Nephrology consulted for further evaluation and management. Hypertonic saline was started initially with improvement in sodium level up to 121 >> 125. To prevent overcorrection, 3% saline was stopped.  Patient is also being treated with IV Lasix  for hypervolemia.  CT chest was negative for any lung masses.  Palliative care was consulted for goals of care discussions.  Patient and family are considering palliative vs hospice care upon discharge.  Further hospital course and management as outlined below.    Assessment and Plan:  Hyponatremia:  Patient has hypervolemic hyponatremia. Her sodium is 117 in ED. s/p 3% hypertonic saline and IV Lasix .  Initially treated with hypertonic saline. --Nephrology following --Off 3% saline --Sodium improved 114 >> 124 (3% stopped) >> 125 >> 124 >> 127 --CT chest negative for lung masses --Serial BMP's to monitor --On IV Lasix  as below   Acute on chronic diastolic CHF (congestive heart failure) (HCC): 2D echo on 06/03/2023 showed EF 55 to 60%.  Patient has SOB, elevated BNP 770, positive JVD and increased interstitial opacity on chest x-ray, clinically consistent with CHF exacerbation. Bilateral pleural effusions - noted on CT chest --Continue Lasix  40 mg BID --Daily weights, strict I/O's --Low sodium diet & fluid restriction  Acute respiratory failure with hypoxia - due to pleural effusions/CHF and underlying chronic lung disease and bronchiectasis (as seen on CT chest).  No prior home oxygen use.  Pt has been on 2-3 L/min here and endorses  shortness of breath. --Supplement O2 to keep sats > 90% --Home O2 qualification if not weaned off in 24-48 hours  Bronchiectasis, Chronic interstitial lung disease -- as per CT chest findings --PRN bronchodilators --PRN Mucinex  --O2 as above   Primary hypertension: -Hold amlodipine , Cozaar , metolazone  and torsemide  due to softer blood pressure. - Patient is on IV Lasix  as above - IV hydralazine  as needed   HLD (hyperlipidemia) -Lipitor   Paroxysmal atrial fibrillation [facility]): Patient is not taking anticoagulants currently due to history of macular degeneration and fall risk . Heart rate 60-80s -continue metoprolol  with holding parameters of SBP< 95    Chronic kidney disease, stage 3a (HCC): Recent baseline creatinine 1.10.  Her creatinine is 1.31, BUN 98, GFR 37, kidney function is close to baseline - Hold Cozaar , metolazone  and torsemide  as above - Nephro seen   Goals of care Per Dr. Maree: As per daughter/[name]/POA, patient has been active until about a month ago.  She was attending daycare center and walking about 30 minutes/day but for about a month now she is not doing much and staying at home.  She is open and interested in exploring possible hospice versus palliative approach to keep her comfortable at home.  Daughter shared that patient is blind and has hearing loss making it very difficult to manage at times --Palliative care and [facility] hospice liaison are following      Subjective: Pt seen with daughter at bedside on rounds.  Pt reports feeling okay, denies complaints.  Does feel little bit short of breath.  Daughter reports pt often has a coarse sounding cough and occasionally wheezing.  Pt  does not use oxygen at home.   Physical Exam: Vitals:   06/16/24 0500 06/16/24 0820 06/16/24 1000 06/16/24 1206  BP:  116/67 107/61 (!) 83/53  Pulse:  76 61 62  Resp:  18  18  Temp:  (!) 97.5 F (36.4 C)  (!) 97.4 F (36.3 C)  TempSrc:  Oral    SpO2:  100% 99% 92%   Weight: 55.2 kg     Height:       General exam: awake, alert, no acute distress HEENT: moist mucus membranes, hard of hearing Respiratory system: diminished bases, intermittent faint rhonchi and wheezes, normal respiratory effort at rest on 3 L/min [place] O2 Cardiovascular system: normal S1/S2, RRR, no pedal edema.   Gastrointestinal system: soft, NT, ND, no HSM felt, +bowel sounds. Central nervous system: A&O x 2+. no gross focal neurologic deficits, normal speech Extremities: moves all, no edema, normal tone Skin: dry, intact, normal temperature Psychiatry: normal mood, congruent affect, judgement and insight appear normal   Data Reviewed:  Notable labs -- Na 127, Cl 85, Glucose 107, BUN 79, Cr 1.14,   Family Communication: daughter at bedside on rounds  Disposition: Status is: Inpatient Remains inpatient appropriate because: remains on IV lasix  and closely monitoring hyponatremia, also requiring oxygen without baseline requirement for o2   Planned Discharge Destination: Home with Home Health    Time spent: 42 minutes  Author: Burnard DELENA Cunning, DO 06/16/2024 2:58 PM  For on call review [email or web address].

## 2024-06-16 NOTE — Progress Notes (Signed)
 Heart Failure Navigator Progress Note  Checked in on patient to schedule a hospital follow-up with Ellouise Class, FNP.  Will come back to schedule when a family member is there.  Per hospitalist note family discussing Hospice vs Palliative care. Assisted patient with breakfast set up prior to leaving her room.  Patient did not need assistance with feeding.  Navigator will sign off at this time.  Charmaine Pines, RN, BSN [facility] Heart Failure Navigator Secure Chat Only

## 2024-06-16 NOTE — Progress Notes (Signed)
 Palliative Care Progress Note, Assessment & Plan   Patient Name: [name]       Date: 06/16/2024 DOB: February 12, 1925  Age: 89 y.o. MRN#: [id number] Attending Physician: Fausto Burnard LABOR, DO Primary Care Physician: Sharma Coyer, MD Admit Date: 06/14/2024  Subjective: Patient is sitting up in bed, awake and alert.  She acknowledges my presence and is able to make her wishes known.  Her daughter [name] is at bedside during my visit.  HPI: 89 y.o. female  with past medical history of  dCHF, HTN, HLD, PMM due to sinus pause 2007, GERD, CKD-CVA, A-fib, blindness d/t macular degeneration, HOH  admitted on 06/14/2024 with shortness of breath and hyponatremia.   Summary of counseling/coordination of care: Extensive chart review completed prior to meeting patient including labs, vital signs, imaging, progress notes, orders, and available advanced directive documents from current and previous encounters.   After reviewing the patient's chart and assessing the patient at bedside, I spoke with patient in regards to symptom management and goals of care.   Symptom discussed.  Patient endorses she feels well.  She is just worked with PT and feels a little tired.  She shares she walked to the door of the room and back without difficulty.  However, a few minutes after working out, she became short of breath and requesting to supplemental oxygen via nasal cannula.  Daughter at bedside is concerned that patient will need oxygen when returning home.  Education provided on oxygen testing and use of oxygen at home.  Discussed importance of having it if patient qualifies for it so that relief can be provided at home without having to call emergency services.  Again I highlighted the difference between hospice and palliative  services.  Extensive discussion of outpatient palliative reviewed.  Wanted in agreement for outpatient palliative services to follow at discharge.  Family is not yet prepared to enact hospice benefits.  Order placed for referral to palliative services. TOC following.   Symptom burden is low.  Goals are clear.  PMT will step back from daily visits and monitor the patient peripherally.  Please re-engage with PMT if goals change, at patient/family's request, or if patient's health deteriorates during hospitalization.    Physical Exam Vitals reviewed.  Constitutional:      Appearance: She is normal weight.  HENT:     Ears:     Comments: HOH    Mouth/Throat:     Mouth: Mucous membranes are moist.  Cardiovascular:     Pulses: Normal pulses.  Pulmonary:     Effort: Pulmonary effort is normal.  Abdominal:     Palpations: Abdomen is soft.  Neurological:     Mental Status: She is alert and oriented to person, place, and time.  Psychiatric:        Mood and Affect: Mood normal.        Behavior: Behavior normal.        Thought Content: Thought content normal.        Judgment: Judgment normal.             Total Time 35 minutes   Time spent includes: Detailed review of medical records (labs, imaging, vital signs), medically appropriate exam (  mental status, respiratory, cardiac, skin), discussed with treatment team, counseling and educating patient, family and staff, documenting clinical information, medication management and coordination of care.  Lamarr L. Arvid, DNP, FNP-BC Palliative Medicine Team

## 2024-06-17 ENCOUNTER — Encounter: Admitting: Family

## 2024-06-17 DIAGNOSIS — E871 Hypo-osmolality and hyponatremia: Secondary | ICD-10-CM | POA: Diagnosis not present

## 2024-06-17 LAB — BASIC METABOLIC PANEL WITH GFR
Anion gap: 9 (ref 5–15)
BUN: 85 mg/dL — ABNORMAL HIGH (ref 8–23)
CO2: 31 mmol/L (ref 22–32)
Calcium: 8.6 mg/dL — ABNORMAL LOW (ref 8.9–10.3)
Chloride: 85 mmol/L — ABNORMAL LOW (ref 98–111)
Creatinine, Ser: 1.34 mg/dL — ABNORMAL HIGH (ref 0.44–1.00)
GFR, Estimated: 36 mL/min — ABNORMAL LOW (ref >60)
Glucose, Bld: 113 mg/dL — ABNORMAL HIGH (ref 70–99)
Potassium: 4.1 mmol/L (ref 3.5–5.1)
Sodium: 125 mmol/L — ABNORMAL LOW (ref 135–145)

## 2024-06-17 MED ORDER — FUROSEMIDE 10 MG/ML IJ SOLN
20.0000 mg | Freq: Two times a day (BID) | INTRAMUSCULAR | Status: DC
  Administered 2024-06-17 – 2024-06-18 (×2): 20 mg via INTRAVENOUS
  Filled 2024-06-17 (×2): qty 2

## 2024-06-17 MED ORDER — MIDODRINE HCL 5 MG PO TABS
5.0000 mg | ORAL_TABLET | Freq: Three times a day (TID) | ORAL | Status: DC
  Administered 2024-06-17 – 2024-06-25 (×21): 5 mg via ORAL
  Filled 2024-06-17 (×24): qty 1

## 2024-06-17 MED ORDER — ENSURE PLUS HIGH PROTEIN PO LIQD
237.0000 mL | Freq: Two times a day (BID) | ORAL | Status: DC
  Administered 2024-06-17 – 2024-06-24 (×10): 237 mL via ORAL

## 2024-06-17 MED ORDER — PANTOPRAZOLE SODIUM 20 MG PO TBEC
20.0000 mg | DELAYED_RELEASE_TABLET | Freq: Every day | ORAL | Status: DC
  Administered 2024-06-18: 20 mg via ORAL
  Filled 2024-06-17: qty 1

## 2024-06-17 MED ORDER — DOCUSATE SODIUM 100 MG PO CAPS
100.0000 mg | ORAL_CAPSULE | Freq: Two times a day (BID) | ORAL | Status: DC
  Administered 2024-06-17 – 2024-06-26 (×13): 100 mg via ORAL
  Filled 2024-06-17 (×18): qty 1

## 2024-06-17 MED ORDER — METOPROLOL SUCCINATE ER 50 MG PO TB24
50.0000 mg | ORAL_TABLET | Freq: Every day | ORAL | Status: DC
  Administered 2024-06-17 – 2024-06-18 (×2): 50 mg via ORAL
  Filled 2024-06-17 (×2): qty 1

## 2024-06-17 NOTE — Progress Notes (Signed)
 [facility] Liaison Note  Received a referral from [facility], Elouise Capri, RN, for outpatient palliative care services at home after discharge.    Referral submitted today.   Please call with any palliative care questions or concerns.  Thank you for the opportunity to participate in this patient's care  [facility] Liaison [phone]

## 2024-06-17 NOTE — Plan of Care (Signed)

## 2024-06-17 NOTE — Progress Notes (Signed)
 Progress Note   Patient: [name] FMW:[id number] DOB: 1925-01-07 DOA: 06/14/2024     3 DOS: the patient was seen and examined on 06/17/2024   Brief hospital course: 89 y.o. female with medical history significant of dCHF, HTN, HLD, PMM due to sinus pause 2007, GERD, CKD-CVA, A-fib, blindness, HOH, who presents with SOB and abnormal labs with hyponatremia, initial sodium level 114.  Patient was admitted with Nephrology consulted for further evaluation and management. Hypertonic saline was started initially with improvement in sodium level up to 121 >> 125. To prevent overcorrection, 3% saline was stopped.  Patient is also being treated with IV Lasix  for hypervolemia.  CT chest was negative for any lung masses.  Palliative care was consulted for goals of care discussions.  Patient and family are considering palliative vs hospice care upon discharge.  Further hospital course and management as outlined below.    Assessment and Plan:  Hyponatremia:  Patient has hypervolemic hyponatremia. Her sodium is 117 in ED. s/p 3% hypertonic saline and IV Lasix .  Initially treated with hypertonic saline. --Nephrology following --Off 3% saline --Sodium improved 114 >> 124 (3% stopped) >> 125 >> 124 >> 127 >> 125 --CT chest negative for lung masses --Serial BMP's to monitor --On IV Lasix  40 mg BID -- of note, both doses held on 7/9 due to soft BP's and sodium dropped 127 >> 125 again.  Started midodrine  to facilitate ability to diurese adequately.   Acute on chronic diastolic CHF (congestive heart failure) (HCC): 2D echo on 06/03/2023 showed EF 55 to 60%.  Patient has SOB, elevated BNP 770, positive JVD and increased interstitial opacity on chest x-ray, clinically consistent with CHF exacerbation. Bilateral pleural effusions - noted on CT chest --Continue Lasix  40 mg BID --Daily weights, strict I/O's --Low sodium diet & fluid restriction  Acute respiratory failure with hypoxia - due to pleural  effusions/CHF and underlying chronic lung disease and bronchiectasis (as seen on CT chest).  No prior home oxygen use.  Pt has been on 2-3 L/min here and endorses shortness of breath. --Supplement O2 to keep sats > 90% --Home O2 qualification today - pt meets criteria, will order home O2 upon d/c  Bronchiectasis, Chronic interstitial lung disease -- as per CT chest findings --PRN bronchodilators --PRN Mucinex  --O2 as above    Primary hypertension: pt with soft and low BP's here -Hold amlodipine , Cozaar , metolazone  and torsemide  due to softer blood pressure. -Metoprolol  decreased 200 >> 50 mg -Start midodrine  5 mg TID to give BP room for diuresis - Patient is on IV Lasix  as above - IV hydralazine  as needed   HLD (hyperlipidemia) -Lipitor   Paroxysmal atrial fibrillation [facility]): Patient is not taking anticoagulants currently due to history of macular degeneration and fall risk . Heart rate 60-80s -lowered dose of metoprolol  from 200 >> 50 mg daily, hold if MAP< 65   Chronic kidney disease, stage 3a (HCC): Recent baseline creatinine 1.10.  Her creatinine is 1.31, BUN 98, GFR 37, kidney function is close to baseline - Hold Cozaar , metolazone  and torsemide  as above - Nephro seen   Goals of care Per Dr. Maree: As per daughter/Wanda/POA, patient has been active until about a month ago.  She was attending daycare center and walking about 30 minutes/day but for about a month now she is not doing much and staying at home.  She is open and interested in exploring possible hospice versus palliative approach to keep her comfortable at home.  Daughter shared that patient is  blind and has hearing loss making it very difficult to manage at times --Palliative care and [facility] hospice liaison are following      Subjective: Pt seen with daughter at bedside on rounds.  Nursing students ambulating for oxygen requirements and pt did well, daughter reports pt appears more unsteady than with her own  walker and familiar surroundings at home.  Pt denies complaints to me.  Daughter reported pt got very dizzy earlier this AM, and had some nausea.     Physical Exam: Vitals:   06/17/24 1140 06/17/24 1152 06/17/24 1305 06/17/24 1400  BP: 124/63     Pulse: (!) 58     Resp: 20     Temp:  (!) 96 F (35.6 C) (!) 97.3 F (36.3 C) (!) 97.3 F (36.3 C)  TempSrc:  Rectal Axillary Oral  SpO2: 98%     Weight:      Height:       General exam: awake, alert, no acute distress HEENT: moist mucus membranes, hard of hearing Respiratory system: lungs clear with diminished L>R base, no rhonchi or wheezes, normal respiratory effort on 2 L/min [place] O2 Cardiovascular system: normal S1/S2, RRR, no pedal edema.   Gastrointestinal system: soft, NT, ND Central nervous system: A&O x 2+. no gross focal neurologic deficits, normal speech Extremities: moves all, no edema, normal tone Skin: dry, intact, normal temperature Psychiatry: normal mood, congruent affect   Data Reviewed:  Notable labs -- Na 127 >> 125 (after holding yesterday Lasix  x 2 due to hypotension), Cl 85, Glucose 1113, BUN 85, Cr 1.14 >> 1.34   Family Communication: daughter at bedside on rounds  Disposition: Status is: Inpatient Remains inpatient appropriate because: remains on IV lasix  and closely monitoring hyponatremia, also requiring oxygen without baseline requirement for o2   Planned Discharge Destination: Home with Home Health    Time spent: 45 minutes  Author: Burnard DELENA Cunning, DO 06/17/2024 2:07 PM  For on call review [email or web address].

## 2024-06-17 NOTE — TOC Progression Note (Addendum)
 Transition of Care [facility]) - Progression Note    Patient Details  Name: [name] MRN: [id number] Date of Birth: 1925-02-21  [facility] CM/SW Contact  Elouise LULLA Capri, RN Phone Number: 06/17/2024, 10:48 AM  Clinical Narrative:     Noted, referral for outpatient palliative services. CM alert to Jack C, [facility] regarding outpatient palliative services.  Noted, home health PT recommendations. CM call to patient's daughter, Lamarr regarding home health and 3 in 1 [facility] recommendations. Per patient's daughter, patient has previous experience with [facility]. CM call to [facility], [facility], phone: [phone] regarding HHPT. Per Larraine, [facility] has accepted patient for home health PT. CM secure message to Lynwood Amel regarding 3 in 1 bedside commode for room delivery    Barriers to Discharge: Continued Medical Work up  Expected Discharge Plan and Services In-house Referral: Clinical Social Work     Living arrangements for the past 2 months: Single Family Home                  Social Determinants of Health (SDOH) Interventions SDOH Screenings   Food Insecurity: No Food Insecurity (06/15/2024)  Housing: Low Risk  (06/15/2024)  Transportation Needs: No Transportation Needs (06/15/2024)  Utilities: Not At Risk (06/15/2024)  Alcohol Screen: Low Risk  (07/08/2023)  Depression (PHQ2-9): Medium Risk (04/28/2024)  Financial Resource Strain: Low Risk  (11/24/2023)  Physical Activity: Sufficiently Active (11/24/2023)  Social Connections: Patient Unable To Answer (06/15/2024)  Stress: No Stress Concern Present (11/24/2023)  Tobacco Use: Low Risk  (06/15/2024)  Health Literacy: Adequate Health Literacy (07/08/2023)    Readmission Risk Interventions     No data to display

## 2024-06-17 NOTE — Progress Notes (Signed)
 [facility]  ROUNDING NOTE   Subjective:   Patient sitting up in bed receiving assistance with breakfast tray Remains on 2 L nasal cannula No lower extremity edema  Sodium 125  Objective:  Vital signs in last 24 hours:  Temp:  [97.4 F (36.3 C)-97.8 F (36.6 C)] 97.8 F (36.6 C) (07/10 0739) Pulse Rate:  [59-78] 62 (07/10 0944) Resp:  [18-22] 20 (07/10 0739) BP: (83-103)/(44-60) 103/56 (07/10 0944) SpO2:  [92 %-100 %] 97 % (07/10 0739) Weight:  [55.5 kg] 55.5 kg (07/10 0500)  Weight change: 8.9 kg Filed Weights   06/15/24 1744 06/16/24 0500 06/17/24 0500  Weight: 46.6 kg 55.2 kg 55.5 kg    Intake/Output: I/O last 3 completed shifts: In: -  Out: 600 [Urine:600]   Intake/Output this shift:  Total I/O In: 480 [P.O.:480] Out: -   Physical Exam: General: Chronically ill-appearing  Head: Normocephalic, atraumatic. Moist oral mucosal membranes  Eyes: Anicteric  Neck: Supple  Lungs:  Clear to auscultation, normal effort  Heart: Regular rate and rhythm  Abdomen:  Soft, nontender  Extremities: No peripheral edema.  Neurologic: Awake, alert  Skin: Warm,dry, no rash       Basic Metabolic Panel: Recent Labs  Lab 06/14/24 1503 06/14/24 1731 06/14/24 1933 06/14/24 2057 06/15/24 0434 06/15/24 0848 06/15/24 1420 06/16/24 1024 06/17/24 0423  NA 114* 117* 115*   < > 121* 125* 124* 127* 125*  K 4.1 4.4  --   --  3.6  --   --  4.1 4.1  CL 76* 77*  --   --  82*  --   --  85* 85*  CO2 23 23  --   --  27  --   --  32 31  GLUCOSE 110* 117*  --   --  96  --   --  107* 113*  BUN 96* 98*  --   --  90*  --   --  79* 85*  CREATININE 1.35* 1.31*  --   --  1.09*  --   --  1.14* 1.34*  CALCIUM  8.6* 8.8*  --   --  8.7*  --   --  8.9 8.6*  MG  --   --   --   --  2.3  --   --   --   --   PHOS  --   --  3.8  --   --   --   --   --   --    < > = values in this interval not displayed.    Liver Function Tests: No results for input(s): AST, ALT, ALKPHOS,  BILITOT, PROT, ALBUMIN in the last 168 hours. No results for input(s): LIPASE, AMYLASE in the last 168 hours. No results for input(s): AMMONIA in the last 168 hours.  CBC: Recent Labs  Lab 06/14/24 1731 06/15/24 0434  WBC 5.5 3.5*  HGB 12.7 12.5  HCT 35.8* 35.9*  MCV 95.2 96.8  PLT 206 186    Cardiac Enzymes: No results for input(s): CKTOTAL, CKMB, CKMBINDEX, TROPONINI in the last 168 hours.  BNP: Invalid input(s): POCBNP  CBG: No results for input(s): GLUCAP in the last 168 hours.  Microbiology: Results for orders placed or performed in visit on 04/24/22  Microscopic Examination     Status: None   Collection Time: 04/24/22  2:26 PM   Urine  Result Value Ref Range Status   WBC, UA 0-5 0 - 5 /hpf Final  RBC, Urine 0-2 0 - 2 /hpf Final   Epithelial Cells (non renal) None seen 0 - 10 /hpf Final   Bacteria, UA None seen None seen/Few Final    Coagulation Studies: No results for input(s): LABPROT, INR in the last 72 hours.  Urinalysis: Recent Labs    06/15/24 0117  COLORURINE STRAW*  LABSPEC 1.006  PHURINE 7.0  GLUCOSEU NEGATIVE  HGBUR NEGATIVE  BILIRUBINUR NEGATIVE  KETONESUR NEGATIVE  PROTEINUR NEGATIVE  NITRITE NEGATIVE  LEUKOCYTESUR NEGATIVE      Imaging: CT CHEST WO CONTRAST Result Date: 06/15/2024 CLINICAL DATA:  Severe hyponatremia. Concern for underlying lung cancer. EXAM: CT CHEST WITHOUT CONTRAST TECHNIQUE: Multidetector CT imaging of the chest was performed following the standard protocol without IV contrast. RADIATION DOSE REDUCTION: This exam was performed according to the departmental dose-optimization program which includes automated exposure control, adjustment of the mA and/or kV according to patient size and/or use of iterative reconstruction technique. COMPARISON:  Chest radiograph 06/14/2024, 03/17/2024, in 02/03/2022 FINDINGS: Cardiovascular: Cardiac enlargement. No pericardial effusions. Cardiac pacemaker.  Calcification in the mitral valve annulus, aortic, and coronary arteries. No aortic aneurysm. Mediastinum/Nodes: Thyroid gland is unremarkable. Esophagus is moderately distended and fluid-filled with air-fluid level down to the mid/distal esophageal region. No discrete obstructing lesion is identified. Changes most likely to represent dysmotility but underlying stricture is not excluded. Consider follow-up versus endoscopy. Scattered mediastinal lymph nodes are not pathologically enlarged. Lungs/Pleura: Small bilateral pleural effusions. Diffuse interstitial pattern to the lungs most prominent in the periphery and bases with some associated honeycomb changes and bronchiectasis. 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 No superimposed consolidation or focal mass lesion identified. No pneumothorax. Upper Abdomen: No acute abnormalities. Musculoskeletal: Soft tissue edema in the chest wall and visualized upper abdominal wall. Degenerative changes in the spine and shoulders. Compression of T7 vertebra. This is unchanged since prior chest radiograph of 03/17/2024. No acute bony abnormalities. IMPRESSION: 1. Cardiac enlargement. 2. Small bilateral pleural effusions, greater on the right. 3. Diffuse interstitial pattern to the lungs with some bronchiectasis and honeycomb changes likely representing usual interstitial pneumonitis or other chronic interstitial lung disease. 4. Upper esophageal dilatation with air-fluid level. No obstructing mass lesion is identified. Changes likely represent dysmotility although a stricture or occult obstructing lesion is not excluded. Consider follow-up versus endoscopy. 5. Aortic atherosclerosis. Electronically Signed   By: Elsie Gravely M.D.   On: 06/15/2024 20:07     Medications:     atorvastatin   10 mg Oral Daily   enoxaparin  (LOVENOX ) injection  30 mg Subcutaneous Q24H   furosemide   40 mg Intravenous Q12H   metoprolol   50 mg Oral Daily   midodrine   5 mg  Oral TID WC   pantoprazole   20 mg Oral Daily   acetaminophen , albuterol , dextromethorphan-guaiFENesin , hydrALAZINE , ondansetron  (ZOFRAN ) IV  Assessment/ Plan:  [name] is a 89 y.o.  female with past medical conditions including hypertension, hyperlipidemia, GERD, atrial fibrillation, hard hearing, blindness, chronic kidney disease, who was admitted to [facility] on 06/14/2024 for Hypernatremia [E87.0] Hyponatremia [E87.1]   Hyponatremia, etiology unclear at this time due to questionable mental status.  Sodium 114 on admission. Did receive 3% hypertonic saline during this admission.  Sodium remains stable, 125 today.  Would be hesitant to restart 3% oral for tolvaptan in this patient.  If aggressive measures are desired, can consider normal saline.  Will continue to monitor.  Chronic kidney disease stage III A.  Baseline appears to be 1.09 with GFR 46 on 12/01/2023.  Creatinine 1.35 on admission.  Creatinine has fluctuated but steadily risen some over the past day, possibly indicating dehydration.  Will remove fluid restriction and encourage patient to increase water intake.  Will also offer Ensure supplementation, if not ordered.     LOS: 3 [name] 7/10/202510:50 AM

## 2024-06-17 NOTE — Progress Notes (Signed)
 OT Cancellation Note  Patient Details Name: [name] MRN: [id number] DOB: Nov 24, 1925   Cancelled Treatment:    Reason Eval/Treat Not Completed: Patient declined, no reason specified.  Patient was attempted x2 this AM and again this afternoon.  Will attempt on another date.  [name] 06/17/2024, 2:48 PM

## 2024-06-17 NOTE — Progress Notes (Signed)
SATURATION QUALIFICATIONS: (This note is used to comply with regulatory documentation for home oxygen)  Patient Saturations on Room Air at Rest = 88%  Patient Saturations on Room Air while Ambulating = 84%  Patient Saturations on 2 Liters of oxygen while Ambulating = 92%  Please briefly explain why patient needs home oxygen: 

## 2024-06-17 NOTE — Progress Notes (Cosign Needed)
 Pt alert, vital sign stable, daughter at bedside feeding pt. This student Rn assisted pt with breakfast, brushing teeth in the morning. Student RN also assisted pt to  the bedside commode. This Runner, broadcasting/film/video along with another student RN assisted pt walk in the hallways, pt tolerated walking fine but O2 stats dropped in the 80's without 2 litters O2 nasal canula.

## 2024-06-18 DIAGNOSIS — E871 Hypo-osmolality and hyponatremia: Secondary | ICD-10-CM | POA: Diagnosis not present

## 2024-06-18 LAB — BASIC METABOLIC PANEL WITH GFR
Anion gap: 10 (ref 5–15)
BUN: 88 mg/dL — ABNORMAL HIGH (ref 8–23)
CO2: 32 mmol/L (ref 22–32)
Calcium: 9 mg/dL (ref 8.9–10.3)
Chloride: 84 mmol/L — ABNORMAL LOW (ref 98–111)
Creatinine, Ser: 1.42 mg/dL — ABNORMAL HIGH (ref 0.44–1.00)
GFR, Estimated: 34 mL/min — ABNORMAL LOW (ref >60)
Glucose, Bld: 99 mg/dL (ref 70–99)
Potassium: 4.3 mmol/L (ref 3.5–5.1)
Sodium: 126 mmol/L — ABNORMAL LOW (ref 135–145)

## 2024-06-18 MED ORDER — PANTOPRAZOLE SODIUM 40 MG PO TBEC
40.0000 mg | DELAYED_RELEASE_TABLET | Freq: Two times a day (BID) | ORAL | Status: DC
  Administered 2024-06-18 – 2024-06-26 (×16): 40 mg via ORAL
  Filled 2024-06-18 (×17): qty 1

## 2024-06-18 MED ORDER — PANTOPRAZOLE SODIUM 20 MG PO TBEC: 20.0000 mg | DELAYED_RELEASE_TABLET | Freq: Two times a day (BID) | ORAL | Status: DC

## 2024-06-18 MED ORDER — METOPROLOL TARTRATE 25 MG PO TABS
25.0000 mg | ORAL_TABLET | Freq: Two times a day (BID) | ORAL | Status: DC
  Administered 2024-06-19 – 2024-06-24 (×11): 25 mg via ORAL
  Filled 2024-06-18 (×11): qty 1

## 2024-06-18 MED ORDER — SODIUM CHLORIDE 0.9 % IV SOLN: INTRAVENOUS | Status: DC

## 2024-06-18 NOTE — Progress Notes (Signed)
 Occupational Therapy Treatment Patient Details Name: [name] MRN: [id number] DOB: February 16, 1925 Today's Date: 06/18/2024   History of present illness [name] is a 89yoF who comes to [name] on 06/14/24  c SOB. PMH: CHF, HTN, HLD, CKD, CVA, AF, blindness, HOH. DTR reports recent LEE as well. In ED pt with intermittent confusion, fatigue, imbalance- workup revealing of Na+ 114, BP 84/50mmHg.   OT comments  Pt seen for OT treatment on this date. Upon arrival to room pt lying in bed with soiled linens, agreeable to tx. Bed mobility with Min A, sit to stand transfer with Min A to RW, transfer to [facility] (repositioned to lower height to support increased independence with transfers).  Grooming completed while seated with supervision following orientation for set up due to low vision), light UB bathing tasks while seated with SBA following set-up, standing anterior/posterior perineal hygiene with CGA to establish BOS.  Gown donned in standing at RW with Min A. Pt requires CGA for functional mobility within room at RW level to access recliner chair with cuing due to low vision.  Patient reported feeling lightheaded, therapist reclined patient in chair, BP monitored at 108/53, HR: 61.  RN was present and also attending to patient at this time.  At end of session, patient seated in recliner with BLEs elevated, chair alarm activated, RN present and attending. Pt making good progress toward goals, will continue to follow POC. Discharge recommendation remains appropriate.        If plan is discharge home, recommend the following:  A little help with walking and/or transfers;A little help with bathing/dressing/bathroom;Help with stairs or ramp for entrance   Equipment Recommendations  BSC/3in1    Recommendations for Other Services      Precautions / Restrictions Precautions Precautions: Fall Recall of Precautions/Restrictions: Intact Precaution/Restrictions Comments: impairment with safety awareness        Mobility Bed Mobility Overal bed mobility: Needs Assistance Bed Mobility: Supine to Sit Rolling: Min assist              Transfers Overall transfer level: Needs assistance Equipment used: Rolling walker (2 wheels) Transfers: Sit to/from Stand Sit to Stand: Min assist                 Balance Overall balance assessment: Needs assistance   Sitting balance-Leahy Scale: Good       Standing balance-Leahy Scale: Fair                             ADL either performed or assessed with clinical judgement   ADL Overall ADL's : Needs assistance/impaired     Grooming: Wash/dry face;Supervision/safety Grooming Details (indicate cue type and reason): Supervision for task following set-up with orientation of objects Upper Body Bathing: Supervision/ safety Upper Body Bathing Details (indicate cue type and reason): seated, set-up for orienting to objects     Upper Body Dressing : Minimal assistance Upper Body Dressing Details (indicate cue type and reason): completed in standing     Toilet Transfer: Minimal assistance Toilet Transfer Details (indicate cue type and reason): from 3:1 commode seat Toileting- Clothing Manipulation and Hygiene: Minimal assistance Toileting - Clothing Manipulation Details (indicate cue type and reason): for anterior/posterior perneal hygiene     Functional mobility during ADLs: Contact guard assist;Rolling walker (2 wheels);Cueing for safety General ADL Comments: cuing due to low vision    Extremity/Trunk Assessment Upper Extremity Assessment Upper Extremity Assessment: Generalized weakness  Vision Baseline Vision/History: 2 Legally blind Ability to See in Adequate Light: 3 Highly impaired Patient Visual Report: No change from baseline     Perception     Praxis     Communication Communication Factors Affecting Communication: Hearing impaired   Cognition Arousal: Alert Behavior During Therapy: WFL for  tasks assessed/performed Cognition: No apparent impairments                               Following commands: Intact        Cueing   Cueing Techniques: Verbal cues, Tactile cues  Exercises Other Exercises Other Exercises: education on safety awareness and transfer training    Shoulder Instructions       General Comments      Pertinent Vitals/ Pain       Pain Assessment Pain Assessment: No/denies pain  Home Living                                          Prior Functioning/Environment              Frequency  Min 2X/week        Progress Toward Goals  OT Goals(current goals can now be found in the care plan section)  Progress towards OT goals: Progressing toward goals     Plan      Co-evaluation                 AM-PAC OT 6 Clicks Daily Activity     Outcome Measure   Help from another person eating meals?: A Little Help from another person taking care of personal grooming?: A Little Help from another person toileting, which includes using toliet, bedpan, or urinal?: A Little Help from another person bathing (including washing, rinsing, drying)?: A Lot Help from another person to put on and taking off regular upper body clothing?: A Little Help from another person to put on and taking off regular lower body clothing?: A Lot 6 Click Score: 16    End of Session Equipment Utilized During Treatment: Gait belt;Rolling walker (2 wheels);Oxygen  OT Visit Diagnosis: Unsteadiness on feet (R26.81);Muscle weakness (generalized) (M62.81);History of falling (Z91.81)   Activity Tolerance Patient tolerated treatment well   Patient Left in chair;with call bell/phone within reach;with chair alarm set;with nursing/sitter in room   Nurse Communication Mobility status        Time: 9148-9063 OT Time Calculation (min): 45 min  Charges: OT General Charges $OT Visit: 1 Visit   [name] OTR/L    [name] 06/18/2024, 9:51 AM

## 2024-06-18 NOTE — Progress Notes (Signed)
 [facility]  ROUNDING NOTE   Subjective:   Patient seen sitting up in chair Daughter at bedside assisting with breakfast   Sodium 126  Objective:  Vital signs in last 24 hours:  Temp:  [96 F (35.6 C)-98.1 F (36.7 C)] 98 F (36.7 C) (07/11 0900) Pulse Rate:  [58-87] 61 (07/11 0938) Resp:  [16-20] 16 (07/11 0900) BP: (99-124)/(50-72) 108/53 (07/11 0938) SpO2:  [92 %-98 %] 92 % (07/11 0938) Weight:  [56.2 kg] 56.2 kg (07/11 0500)  Weight change: 0.7 kg Filed Weights   06/16/24 0500 06/17/24 0500 06/18/24 0500  Weight: 55.2 kg 55.5 kg 56.2 kg    Intake/Output: I/O last 3 completed shifts: In: 720 [P.O.:720] Out: 580 [Urine:580]   Intake/Output this shift:  No intake/output data recorded.  Physical Exam: General: Chronically ill-appearing  Head: Normocephalic, atraumatic. Moist oral mucosal membranes  Eyes: Anicteric  Neck: Supple  Lungs:  Clear to auscultation, normal effort  Heart: Regular rate and rhythm  Abdomen:  Soft, nontender  Extremities: No peripheral edema.  Neurologic: Awake, alert  Skin: Warm,dry, no rash       Basic Metabolic Panel: Recent Labs  Lab 06/14/24 1731 06/14/24 1933 06/14/24 2057 06/15/24 0434 06/15/24 0848 06/15/24 1420 06/16/24 1024 06/17/24 0423 06/18/24 0540  NA 117* 115*   < > 121* 125* 124* 127* 125* 126*  K 4.4  --   --  3.6  --   --  4.1 4.1 4.3  CL 77*  --   --  82*  --   --  85* 85* 84*  CO2 23  --   --  27  --   --  32 31 32  GLUCOSE 117*  --   --  96  --   --  107* 113* 99  BUN 98*  --   --  90*  --   --  79* 85* 88*  CREATININE 1.31*  --   --  1.09*  --   --  1.14* 1.34* 1.42*  CALCIUM  8.8*  --   --  8.7*  --   --  8.9 8.6* 9.0  MG  --   --   --  2.3  --   --   --   --   --   PHOS  --  3.8  --   --   --   --   --   --   --    < > = values in this interval not displayed.    Liver Function Tests: No results for input(s): AST, ALT, ALKPHOS, BILITOT, PROT, ALBUMIN in the last 168  hours. No results for input(s): LIPASE, AMYLASE in the last 168 hours. No results for input(s): AMMONIA in the last 168 hours.  CBC: Recent Labs  Lab 06/14/24 1731 06/15/24 0434  WBC 5.5 3.5*  HGB 12.7 12.5  HCT 35.8* 35.9*  MCV 95.2 96.8  PLT 206 186    Cardiac Enzymes: No results for input(s): CKTOTAL, CKMB, CKMBINDEX, TROPONINI in the last 168 hours.  BNP: Invalid input(s): POCBNP  CBG: No results for input(s): GLUCAP in the last 168 hours.  Microbiology: Results for orders placed or performed in visit on 04/24/22  Microscopic Examination     Status: None   Collection Time: 04/24/22  2:26 PM   Urine  Result Value Ref Range Status   WBC, UA 0-5 0 - 5 /hpf Final   RBC, Urine 0-2 0 - 2 /hpf Final   Epithelial  Cells (non renal) None seen 0 - 10 /hpf Final   Bacteria, UA None seen None seen/Few Final    Coagulation Studies: No results for input(s): LABPROT, INR in the last 72 hours.  Urinalysis: No results for input(s): COLORURINE, LABSPEC, PHURINE, GLUCOSEU, HGBUR, BILIRUBINUR, KETONESUR, PROTEINUR, UROBILINOGEN, NITRITE, LEUKOCYTESUR in the last 72 hours.  Invalid input(s): APPERANCEUR     Imaging: No results found.    Medications:     atorvastatin   10 mg Oral Daily   docusate sodium   100 mg Oral BID   enoxaparin  (LOVENOX ) injection  30 mg Subcutaneous Q24H   feeding supplement  237 mL Oral BID BM   metoprolol   50 mg Oral Daily   midodrine   5 mg Oral TID WC   pantoprazole   20 mg Oral QAC breakfast   acetaminophen , albuterol , dextromethorphan-guaiFENesin , hydrALAZINE , ondansetron  (ZOFRAN ) IV  Assessment/ Plan:  [name] is a 89 y.o.  female with past medical conditions including hypertension, hyperlipidemia, GERD, atrial fibrillation, hard hearing, blindness, chronic kidney disease, who was admitted to [facility] on 06/14/2024 for Hypernatremia [E87.0] Hyponatremia [E87.1]   Hyponatremia, etiology  unclear at this time due to questionable mental status.  Sodium 114 on admission. Did receive 3% hypertonic saline during this admission. Would be hesitant to restart 3% oral for tolvaptan in this patient.  Discussed with daughter that sodium is 126 but treatments to improve sodium will likely worsen dehydration.   Chronic kidney disease stage III A.  Baseline appears to be 1.09 with GFR 46 on 12/01/2023.  Creatinine 1.35 on admission.  Creatinine has fluctuated but steadily risen some over the past day, possibly indicating dehydration.  Furosemide  held. Treating dehydration will likely worsen hyponatremia. Daughter agreeable to proceed. Will order a trial of normal saline at 50ml/hr.      LOS: 4 [name] 7/11/202511:20 AM

## 2024-06-18 NOTE — Plan of Care (Signed)

## 2024-06-18 NOTE — Progress Notes (Signed)
 Progress Note   Patient: [name] FMW:[id number] DOB: Jun 16, 1925 DOA: 06/14/2024     4 DOS: the patient was seen and examined on 06/18/2024   Brief hospital course: 89 y.o. female with medical history significant of dCHF, HTN, HLD, PMM due to sinus pause 2007, GERD, CKD-CVA, A-fib, blindness, HOH, who presents with SOB and abnormal labs with hyponatremia, initial sodium level 114.  Patient was admitted with Nephrology consulted for further evaluation and management. Hypertonic saline was started initially with improvement in sodium level up to 121 >> 125. To prevent overcorrection, 3% saline was stopped.  Patient is also being treated with IV Lasix  for hypervolemia.  CT chest was negative for any lung masses.  Palliative care was consulted for goals of care discussions.  Patient and family are considering palliative vs hospice care upon discharge.  Further hospital course and management as outlined below.    Assessment and Plan:  Hyponatremia:  Patient has hypervolemic hyponatremia. Her sodium is 117 in ED. s/p 3% hypertonic saline and IV Lasix .  Initially treated with hypertonic saline. --Nephrology following --Off 3% saline --Sodium improved 114 >> 124 (3% stopped) >> 125 >> 124 >> 127 >> 125 >> 126 --CT chest negative for lung masses --Serial BMP's to monitor --Holding Lasix  today due to rising Cr --Nephrology considering gentle IV hydration as PO intake has been poor   Acute on chronic diastolic CHF (congestive heart failure) (HCC): 2D echo on 06/03/2023 showed EF 55 to 60%.  Patient has SOB, elevated BNP 770, positive JVD and increased interstitial opacity on chest x-ray, clinically consistent with CHF exacerbation. Bilateral pleural effusions - noted on CT chest --Holding diuresis with Cr rising --Daily weights, strict I/O's --Low sodium diet & fluid restriction  AKI superimposed on CKD stage 3a [facility]): Recent baseline creatinine 1.10.   Cr has increased in setting of  diuresis, 1.09 >> 1.14 >> 1.34 >> 1.42 - Hold IV Lasix  - Hold Cozaar , metolazone  and torsemide  as above - Nephrology following - Started on midodrine  for soft BP's - Ensure adequate renal perfusion    Acute respiratory failure with hypoxia - due to pleural effusions/CHF and underlying chronic lung disease and bronchiectasis (as seen on CT chest).  No prior home oxygen use.  Pt has been on 2-3 L/min here and endorses shortness of breath. --Supplement O2 to keep sats > 90% --Home O2 qualification today - pt meets criteria, will order home O2 upon d/c  Bronchiectasis, Chronic interstitial lung disease -- as per CT chest findings --PRN bronchodilators --PRN Mucinex  --O2 as above    Primary hypertension: pt with soft and low BP's here -Hold amlodipine , Cozaar , metolazone  and torsemide  due to softer blood pressure. -Metoprolol  decreased 200 >> 50 mg -Started midodrine  5 mg TID due to low BP's - Patient is on IV Lasix  as above - IV hydralazine  as needed   HLD (hyperlipidemia) -Lipitor   Paroxysmal atrial fibrillation [facility]): Patient is not taking anticoagulants currently due to history of macular degeneration and fall risk . Heart rate 60-80s -lowered dose of metoprolol  from 200 >> 50 mg daily, hold if MAP< 65    Goals of care Per Dr. Maree: As per daughter/Wanda/POA, patient has been active until about a month ago.  She was attending daycare center and walking about 30 minutes/day but for about a month now she is not doing much and staying at home.  She is open and interested in exploring possible hospice versus palliative approach to keep her comfortable at home.  Daughter  shared that patient is blind and has hearing loss making it very difficult to manage at times --Palliative care and [facility] hospice liaison are following      Subjective: Pt seen with daughter at bedside on rounds.  Pt sleeping but responds to voice, denies acute complaints, states she feels fair.  Daughter  expresses concerns about pt overall status since admission is declining from her baseline.  Not eating or drinking much, sleeping a lot, difficulty swallowing (with some history of this that improved with Protonix  before breakfast).  We discussed continuing to try to medically optimize patient, multiple reasons for these changes including low BP's, hyponatremia, AKI, hospital environment impact on elderly patients.  Daughter remains hopeful for improvement, but considering the option of hospice if things continue to decline despite our medical efforts.   Physical Exam: Vitals:   06/18/24 0900 06/18/24 0933 06/18/24 0938 06/18/24 1211  BP: 117/72 (!) 108/53 (!) 108/53 (!) 109/52  Pulse: 87  61 (!) 57  Resp: 16   16  Temp: 98 F (36.7 C)   (!) 97.3 F (36.3 C)  TempSrc:      SpO2: 98%  92% 99%  Weight:      Height:       General exam: sleeping but responds to voice, no acute distress HEENT: moist mucus membranes, hard of hearing Respiratory system: lungs clear with diminished L>R base, no rhonchi or wheezes, normal respiratory effort on 2 L/min [place] O2 Cardiovascular system: normal S1/S2, RRR, no pedal edema.   Gastrointestinal system: soft, NT, ND Central nervous system: A&O x 2+. no gross focal neurologic deficits, normal speech Extremities: moves all, no edema, normal tone Skin: dry, intact, normal temperature Psychiatry: normal mood, congruent affect   Data Reviewed:  Notable labs -- Na 125 >> 126, Cl 84, BUN 88, Cr 1.42   Family Communication: daughter at bedside on rounds daily  Disposition: Status is: Inpatient Remains inpatient appropriate because: persistent hyponatremia, AKI, poor PO intake, new oxygen requirement    Planned Discharge Destination: Home with Home Health    Time spent: 52 minutes including time at bedside and coordination of care with staff and consultants.  Author: Burnard DELENA Cunning, DO 06/18/2024 1:51 PM  For on call review [email or web address].

## 2024-06-18 NOTE — Evaluation (Addendum)
 Clinical/Bedside Swallow Evaluation Patient Details  Name: [name] MRN: [id number] Date of Birth: 27-Jan-1925  Today's Date: 06/18/2024 Time: SLP Start Time (ACUTE ONLY): 1445 SLP Stop Time (ACUTE ONLY): 1535 SLP Time Calculation (min) (ACUTE ONLY): 50 min  Past Medical History:  Past Medical History:  Diagnosis Date   Arrhythmia    atrial fibrillation   Congestive heart failure (CHF) (HCC)    Hypertension    Pacemaker 2015   Past Surgical History:  Past Surgical History:  Procedure Laterality Date   ABDOMINAL HYSTERECTOMY     EYE SURGERY     MOHS SURGERY  2023   PACEMAKER PLACEMENT N/A 2007   2017   HPI:  Pt is a 89 y.o. female with medical history significant of dCHF, HTN, HLD, PMM due to sinus pause 2007, GERD, CKD-CVA, A-fib, blindness, HOH, Esophageal phase Dysmotility who presents with SOB and abdominal labs with hyponatremia. Pt has increased SOB and lower extremity edema recently. Pt is following up with advanced heart failure clinic. She was treated with 2 doses of metolazone  in addition to torsemide . She was given 80 mg of IV Lasix  in the clinic today. They have made several additional medication adjustments.  Labs from clinic showed hyponatremia with sodium of 114 for which patient was directed to the ER. Pt has intermittent confusion, fatigue, and poor balance.    Head CT in 2023: Mild-to-moderate for age generalized cerebral atrophy.     Mild-to-moderate patchy and ill-defined hypoattenuation within the  cerebral white matter, nonspecific but compatible with chronic small  vessel ischemic disease.      Chest CT imaging:  1. Cardiac enlargement.  2. Small bilateral pleural effusions, greater on the right.  3. Diffuse interstitial pattern to the lungs with some  bronchiectasis and honeycomb changes likely representing usual  interstitial pneumonitis or other chronic interstitial lung disease.  4. Upper esophageal dilatation with air-fluid level. No obstructing  mass lesion  is identified. Changes likely represent dysmotility  although a stricture or occult obstructing lesion is not excluded.  Consider follow-up versus endoscopy.  5. Aortic atherosclerosis.     Assessment / Plan / Recommendation  Clinical Impression    Pt seen for BSE today. Daughter present. Pt A/O x3; engaged po trials and followed through w/ tasks w/ min-mod cue. Pt verbally responded to general questions. Pt is Blind and wears HAs. Noted wet, phlegmy vocal quality at Baseline PRIOR TO po's given. Pt and Daughter endorsed CHRONIC PHLEGM at home, especially in the mornings when she wakes up. Noted Chest CT imaging indicating Esophageal phase Dysmotility- Upper esophageal dilatation with air-fluid level. No obstructing  mass lesion is identified. Changes likely represent dysmotility  although a stricture or occult obstructing lesion is not excluded.  Consider follow-up versus endoscopy.. Noted she takes 20mg  PPI 1x daily only. On [place] O2 support- 2L; afebrile, WBC not elevated. Sodium 126 today(improved from admit)   OF NOTE: Pt does strongly endorse s/s of Esophageal phase Dysmotility and REFLUX s/s at home w/ PHLEGM frequently in throat. She also endorses throat clearing and coughing to clear PHLEGM; frequent Belching w/ oral intake. See CT of Chest indicating Esophageal phase Dysmotility w/ air-fluid level dilation.    Pt appears to present w/ grossly functional oropharyngeal phase swallowing w/ No immediate, overt oropharyngeal phase dysphagia appreciated during min oral intake of trial consistencies. No immediate, overt clinical s/s of aspiration noted. Pt appears at reduced risk for aspiration from an oropharyngeal phase standpoint when following general aspiration precautions.  HOWEVER, pt has a baseline presentation of Esophageal phase Dysmotility (see CT IMaging) and report of REFLUX/Regurgitation episodes. She is not on a PPI. Noted the CT scan results this admit. Pt also takes 20mg  PPI 1x daily  only at this time.   ANY Esophageal phase Dysmotility or Regurgitation of Reflux material can increase risk for aspiration of the Reflux material during Retrograde flow thus impact Pulmonary status. Pt described issues of Belching often, PHLEGM, throat clearing.     Pt positioned upright in bed for po's and consumed trials of thin liquids Via Cup, purees, and moistened soft, solids w/ No immediate, overt clinical s/s of aspiration noted; clear vocal quality immediately post swallowing, no decline in pulmonary status, no coughing, no decline in O2 sats(97% when checked). Throat clearing and wet vocal quality occurred POST BELCHING w/ thin liquid trials, puree 1x. Pt hocked and cleared PHLEGM 1x during. Oral phase appeared grossly [facility] for bolus management, mastication, and timely A-P transfer/clearing of material. Mastication appropriate for boluses given. OM exam was [facility] for oral clearing; lingual/labial movements. No unilateral weakness. Speech clear.    Recommend a more Mech Soft/regular diet (moistened foods cut/chopped meats- less meats, breads in diet d/t Esophageal phase Dysmotility) w/ thin liquids Via CUP. General aspiration precautions including reducing distractions and Talking during meals, sitting upright during and post meals, SMALL bites/sips SLOWLY. Rest Breaks strongly recommended during meals/oral intake to allow for Esophageal clearing. REFLUX precautions strongly recommended to reduce Regurgitation episodes -- remain upright for 45-60 mins POST meals, HOB upright at night when sleeping.  Discussed w/ MD increased PPI coverage for pt.   Recommend pt f/u w/ GI for assessment/Education/management of Esophageal phase Dysmotility as needs indicate; REFLUX and tx as indicated. Discussion and handouts given on REFLUX, foods/diet, general aspiration precautions, Pills in PUREE- Crushed as able for ease and clearing of Esophagus. Discussed the importance of Oral Care post meals and provided oral  care kits. Answered questions. Pt appears at her Baseline w/ CHRONIC Esophageal phase Dysmotility dx'd. MD to reconsult ST services if any new needs while admitted. NSG updated. Pt/Family appreciative of Education information. Precautions posted in room, chart. Dietician f/u recommended. Palliative Care following at D/C- recommend further discussion of Esophageal phase Dysmotility and its impact on overall oral intake.  SLP Visit Diagnosis: Dysphagia, pharyngoesophageal phase (R13.14) (Known Esophageal phase Dysmotility per CT Imaging; advanced age; deconditioning; support at meals)    Aspiration Risk  Mild aspiration risk;Risk for inadequate nutrition/hydration (reduced when following general aspiration and REFLUX precautions)    Diet Recommendation   Thin;Dysphagia 3 (mechanical soft);Age appropriate regular (cut/chopped meats; moistened foods of Pleasure; avoid Particulate food such as Salads and also dry foods, meats, and breads if problematic for pt (Esopahgeal phase Dysmotility)) = a more Mech Soft/regular diet (moistened foods cut/chopped meats- less meats, breads in diet d/t Esophageal phase Dysmotility) w/ thin liquids Via CUP. General aspiration precautions including reducing distractions and Talking during meals, sitting upright during and post meals, SMALL bites/sips SLOWLY. Rest Breaks during meals/oral intake to allow for Esophageal clearing. REFLUX precautions strongly recommended to reduce Regurgitation episodes -- remain upright for 45-60 mins POST meals, HOB upright at night when sleeping.   Medication Administration: Crushed with puree (for ease) and Esophageal clearing   Other  Recommendations Recommended Consults: Consider GI evaluation;Consider esophageal assessment (Dietician; Palliative Care f/u for GOC and support) Oral Care Recommendations: Oral care BID;Patient independent with oral care;Staff/trained caregiver to provide oral care;Oral care before and after PO (setup)  Assistance Recommended at Discharge  Full  Functional Status Assessment Patient has had a recent decline in their functional status and/or demonstrates limited ability to make significant improvements in function in a reasonable and predictable amount of time  Frequency and Duration  (n/a)   (n/a)       Prognosis Prognosis for improved oropharyngeal function: Fair Barriers to Reach Goals: Time post onset;Severity of deficits Barriers/Prognosis Comment: Known Esophageal phase Dysmotility per CT Imaging; Advanced age; deconditioning; support needed at meals d/t Vision deficits      Swallow Study   General Date of Onset: 06/14/24 HPI: Pt is a 89 y.o. female with medical history significant of dCHF, HTN, HLD, PMM due to sinus pause 2007, GERD, CKD-CVA, A-fib, blindness, HOH, Esophageal phase Dysmotility who presents with SOB and abdominal labs with hyponatremia. Pt has increased SOB and lower extremity edema recently. Pt is following up with advanced heart failure clinic. She was treated with 2 doses of metolazone  in addition to torsemide . She was given 80 mg of IV Lasix  in the clinic today. They have made several additional medication adjustments.  Labs from clinic showed hyponatremia with sodium of 114 for which patient was directed to the ER. Pt has intermittent confusion, fatigue, and poor balance.   Head CT in 2023: Mild-to-moderate for age generalized cerebral atrophy.     Mild-to-moderate patchy and ill-defined hypoattenuation within the  cerebral white matter, nonspecific but compatible with chronic small  vessel ischemic disease.     Chest CT imaging:  1. Cardiac enlargement.  2. Small bilateral pleural effusions, greater on the right.  3. Diffuse interstitial pattern to the lungs with some  bronchiectasis and honeycomb changes likely representing usual  interstitial pneumonitis or other chronic interstitial lung disease.  4. Upper esophageal dilatation with air-fluid level. No obstructing   mass lesion is identified. Changes likely represent dysmotility  although a stricture or occult obstructing lesion is not excluded.  Consider follow-up versus endoscopy.  5. Aortic atherosclerosis. Type of Study: Bedside Swallow Evaluation Previous Swallow Assessment: previously several years ago per Daughter Diet Prior to this Study: Regular;Thin liquids (Level 0) (per MD order) Temperature Spikes Noted: No (WBC 3.5;  Sodium 126; 114 at admit) Respiratory Status: Nasal cannula (2L) History of Recent Intubation: No Behavior/Cognition: Alert;Cooperative;Pleasant mood;Confused;Distractible;Requires cueing (min) Oral Cavity Assessment: Dry Oral Care Completed by SLP: Yes Oral Cavity - Dentition: Adequate natural dentition Vision:  (Blind) Self-Feeding Abilities: Able to feed self;Needs assist;Needs set up;Total assist (putting cup in hands) Patient Positioning: Upright in bed (full support) Baseline Vocal Quality: Normal Volitional Cough: Strong;Congested Volitional Swallow: Able to elicit    Oral/Motor/Sensory Function Overall Oral Motor/Sensory Function: Within functional limits   Ice Chips Ice chips: Not tested   Thin Liquid Thin Liquid: Within functional limits (for initial swallowing) Presentation: Cup;Self Fed (10 trials) Other Comments: mild throat clearing POST Belching/Burping POST swallows    Nectar Thick Nectar Thick Liquid: Not tested   Honey Thick Honey Thick Liquid: Not tested   Puree Puree: Within functional limits Presentation: Spoon (fed; 9 trials)   Solid     Solid: Within functional limits (grossly w/ moistened foods) Presentation: Spoon (fed; 6 trials)        Comer Portugal, MS, CCC-SLP Speech Language Pathologist Rehab Services; [facility] [phone] (ascom) Gussie Murton 06/18/2024,5:11 PM

## 2024-06-18 NOTE — Plan of Care (Signed)
  Problem: Education: Goal: Knowledge of General Education information will improve Description: Including pain rating scale, medication(s)/side effects and non-pharmacologic comfort measures Outcome: Progressing   Problem: Education: Goal: Ability to demonstrate management of disease process will improve Outcome: Progressing   Problem: Education: Goal: Ability to demonstrate management of disease process will improve Outcome: Progressing Goal: Ability to verbalize understanding of medication therapies will improve Outcome: Progressing Goal: Individualized Educational Video(s) Outcome: Progressing   Problem: Activity: Goal: Capacity to carry out activities will improve Outcome: Progressing   Problem: Cardiac: Goal: Ability to achieve and maintain adequate cardiopulmonary perfusion will improve Outcome: Progressing   Problem: Cardiac: Goal: Ability to achieve and maintain adequate cardiopulmonary perfusion will improve Outcome: Progressing

## 2024-06-18 NOTE — Progress Notes (Signed)
 Pt assisted OOB to [facility] this date. Modified session due to frequent BM's and increased fatigue level. Pt required assist for bed mobility and safe transfers. Upon returning to bed, pt placed on Left side due to buttocks soreness. Continue PT acutely with potential plan to return home with daughter and HHPT   06/18/24 1730  PT Visit Information  Assistance Needed +1  History of Present Illness Mckenzye Cutright is a 89yoF who comes to [facility] on 06/14/24  c SOB. PMH: CHF, HTN, HLD, CKD, CVA, AF, blindness, HOH. DTR reports recent LEE as well. In ED pt with intermittent confusion, fatigue, imbalance- workup revealing of Na+ 114, BP 84/51mmHg.  Subjective Data  Patient Stated Goal get back to walkin' daily  Precautions  Precautions Fall  Recall of Precautions/Restrictions Intact  Precaution/Restrictions Comments BLIND  Restrictions  Weight Bearing Restrictions Per Provider Order No  Pain Assessment  Pain Assessment Faces  Faces Pain Scale 4  Pain Location Sore rectum from frequent BMs  Pain Descriptors / Indicators Aching;Burning  Pain Intervention(s) Other (comment) (hygiene and barrier cream applied)  Cognition  Arousal Alert  Behavior During Therapy WFL for tasks assessed/performed  PT - Cognitive impairments No apparent impairments  PT - Cognition Comments  (pleasant and cooperative)  Following Commands  Following commands Intact  Cueing  Cueing Techniques Verbal cues;Tactile cues  Communication  Communication No apparent difficulties  Factors Affecting Communication Hearing impaired  Bed Mobility  Overal bed mobility Needs Assistance  Bed Mobility Supine to Sit;Sit to Supine  Rolling Min assist;Used rails  Supine to sit Mod assist;HOB elevated;Used rails  Sit to supine Mod assist;Used rails  General bed mobility comments  (Increased fatigue due to 4th BM)  Transfers  Overall transfer level Needs assistance  Equipment used None  Transfers Sit to/from Stand  Sit to Stand Min assist   General transfer comment Pt advances a few steps to/from [facility] without AD and MinA for safety due to weakness and poor vision  Ambulation/Gait  General Gait Details deferred this date  Balance  Overall balance assessment Needs assistance  Sitting-balance support Single extremity supported  Sitting balance-Leahy Scale Good  Standing balance support During functional activity;Reliant on assistive device for balance;Bilateral upper extremity supported  Standing balance-Leahy Scale Fair  Standing balance comment MinA to advance a few steps without AD  General Comments  General comments (skin integrity, edema, etc.)  (Session modified due to frequent BM's and fatigue)  PT - End of Session  Equipment Utilized During Treatment Oxygen  Activity Tolerance Patient limited by fatigue;Other (comment) (Frequent BM's)  Patient left in bed;with call bell/phone within reach;with bed alarm set;with family/visitor present  Nurse Communication Mobility status;Other (comment) (Use of BSC)   PT - Assessment/Plan  PT Visit Diagnosis Unsteadiness on feet (R26.81);Repeated falls (R29.6);Other abnormalities of gait and mobility (R26.89)  PT Frequency (ACUTE ONLY) Min 2X/week  Follow Up Recommendations Home health PT  Patient can return home with the following A little help with walking and/or transfers;Direct supervision/assist for medications management;Assist for transportation;Assistance with cooking/housework;Help with stairs or ramp for entrance  PT equipment None recommended by PT  AM-PAC PT 6 Clicks Mobility Outcome Measure (Version 2)  Help needed turning from your back to your side while in a flat bed without using bedrails? 3  Help needed moving from lying on your back to sitting on the side of a flat bed without using bedrails? 3  Help needed moving to and from a bed to a chair (including a wheelchair)?  3  Help needed standing up from a chair using your arms (e.g., wheelchair or bedside chair)? 3   Help needed to walk in hospital room? 3  Help needed climbing 3-5 steps with a railing?  3  6 Click Score 18  Consider Recommendation of Discharge To: Home with [facility]  Progressive Mobility  What is the highest level of mobility based on the progressive mobility assessment? Level 4 (Walks with assist in room) - Balance while marching in place and cannot step forward and back - Complete  Activity Stood at bedside;Transferred to/from [facility];Turned to left side  PT Goal Progression  Progress towards PT goals Progressing toward goals  PT Time Calculation  PT Start Time (ACUTE ONLY) 1700  PT Stop Time (ACUTE ONLY) 1719  PT Time Calculation (min) (ACUTE ONLY) 19 min  PT General Charges  $$ ACUTE PT VISIT 1 Visit  PT Treatments  $Therapeutic Activity 8-22 mins  Darice Bohr, PTA 06/18/2024

## 2024-06-19 DIAGNOSIS — E871 Hypo-osmolality and hyponatremia: Secondary | ICD-10-CM | POA: Diagnosis not present

## 2024-06-19 LAB — BASIC METABOLIC PANEL WITH GFR
Anion gap: 8 (ref 5–15)
BUN: 85 mg/dL — ABNORMAL HIGH (ref 8–23)
CO2: 32 mmol/L (ref 22–32)
Calcium: 8.9 mg/dL (ref 8.9–10.3)
Chloride: 87 mmol/L — ABNORMAL LOW (ref 98–111)
Creatinine, Ser: 1.34 mg/dL — ABNORMAL HIGH (ref 0.44–1.00)
GFR, Estimated: 36 mL/min — ABNORMAL LOW (ref >60)
Glucose, Bld: 100 mg/dL — ABNORMAL HIGH (ref 70–99)
Potassium: 4.1 mmol/L (ref 3.5–5.1)
Sodium: 127 mmol/L — ABNORMAL LOW (ref 135–145)

## 2024-06-19 LAB — CBC
HCT: 34.6 % — ABNORMAL LOW (ref 36.0–46.0)
Hemoglobin: 11.3 g/dL — ABNORMAL LOW (ref 12.0–15.0)
MCH: 33 pg (ref 26.0–34.0)
MCHC: 32.7 g/dL (ref 30.0–36.0)
MCV: 101.2 fL — ABNORMAL HIGH (ref 80.0–100.0)
Platelets: 178 K/uL (ref 150–400)
RBC: 3.42 MIL/uL — ABNORMAL LOW (ref 3.87–5.11)
RDW: 14.1 % (ref 11.5–15.5)
WBC: 4.1 K/uL (ref 4.0–10.5)
nRBC: 0 % (ref 0.0–0.2)

## 2024-06-19 NOTE — Plan of Care (Signed)

## 2024-06-19 NOTE — Progress Notes (Addendum)
 [facility]  ROUNDING NOTE   Subjective:  [name] is a 89 y.o.  female past medical conditions including hypertension, hyperlipidemia, GERD, atrial fibrillation, hard hearing, blindness, chronic kidney disease, who was admitted to [facility] on 06/14/2024 for Hypernatremia [E87.0] Hyponatremia [E87.1]  Daughter at bedside reports that patient was independent and ambulated with walker a week ago but has now since had poor appetite and poor oral intake. Encouraged increased high caloric fluid intake.    Objective:  Vital signs in last 24 hours:  Temp:  [97.5 F (36.4 C)-98.1 F (36.7 C)] 98.1 F (36.7 C) (07/12 0808) Pulse Rate:  [61-95] 65 (07/12 0808) Resp:  [16-18] 18 (07/12 0808) BP: (101-134)/(56-67) 130/58 (07/12 1200) SpO2:  [91 %-100 %] 100 % (07/12 0808) Weight:  [55.6 kg] 55.6 kg (07/12 0448)  Weight change: -0.6 kg Filed Weights   06/17/24 0500 06/18/24 0500 06/19/24 0448  Weight: 55.5 kg 56.2 kg 55.6 kg    Intake/Output: I/O last 3 completed shifts: In: 1180.6 [P.O.:340; I.V.:840.6] Out: -    Intake/Output this shift:  Total I/O In: 120 [P.O.:120] Out: -   Physical Exam: General: Slow to respond at baseline.  Head: Normocephalic, atraumatic.   Eyes: Anicteric,   Neck: Supple  Lungs:  2L [place], diminished to auscultation  Heart: Regular rate and rhythm  Abdomen:  Soft, nontender,   Extremities:  no peripheral edema, pain with palpation of legs and feet.  Neurologic: Alert and awake  Skin: In tact  Access: NONE    Basic Metabolic Panel: Recent Labs  Lab 06/14/24 1933 06/14/24 2057 06/15/24 0434 06/15/24 0848 06/15/24 1420 06/16/24 1024 06/17/24 0423 06/18/24 0540 06/19/24 0623  NA 115*   < > 121*   < > 124* 127* 125* 126* 127*  K  --   --  3.6  --   --  4.1 4.1 4.3 4.1  CL  --   --  82*  --   --  85* 85* 84* 87*  CO2  --   --  27  --   --  32 31 32 32  GLUCOSE  --   --  96  --   --  107* 113* 99 100*  BUN  --   --  90*  --   --   79* 85* 88* 85*  CREATININE  --   --  1.09*  --   --  1.14* 1.34* 1.42* 1.34*  CALCIUM   --   --  8.7*  --   --  8.9 8.6* 9.0 8.9  MG  --   --  2.3  --   --   --   --   --   --   PHOS 3.8  --   --   --   --   --   --   --   --    < > = values in this interval not displayed.    Liver Function Tests: No results for input(s): AST, ALT, ALKPHOS, BILITOT, PROT, ALBUMIN in the last 168 hours. No results for input(s): LIPASE, AMYLASE in the last 168 hours. No results for input(s): AMMONIA in the last 168 hours.  CBC: Recent Labs  Lab 06/14/24 1731 06/15/24 0434 06/19/24 0623  WBC 5.5 3.5* 4.1  HGB 12.7 12.5 11.3*  HCT 35.8* 35.9* 34.6*  MCV 95.2 96.8 101.2*  PLT 206 186 178    Cardiac Enzymes: No results for input(s): CKTOTAL, CKMB, CKMBINDEX, TROPONINI in the last 168  hours.  BNP: Invalid input(s): POCBNP  CBG: No results for input(s): GLUCAP in the last 168 hours.  Microbiology: Results for orders placed or performed in visit on 04/24/22  Microscopic Examination     Status: None   Collection Time: 04/24/22  2:26 PM   Urine  Result Value Ref Range Status   WBC, UA 0-5 0 - 5 /hpf Final   RBC, Urine 0-2 0 - 2 /hpf Final   Epithelial Cells (non renal) None seen 0 - 10 /hpf Final   Bacteria, UA None seen None seen/Few Final    Coagulation Studies: No results for input(s): LABPROT, INR in the last 72 hours.  Urinalysis: No results for input(s): COLORURINE, LABSPEC, PHURINE, GLUCOSEU, HGBUR, BILIRUBINUR, KETONESUR, PROTEINUR, UROBILINOGEN, NITRITE, LEUKOCYTESUR in the last 72 hours.  Invalid input(s): APPERANCEUR    Imaging: No results found.   Medications:     atorvastatin   10 mg Oral Daily   docusate sodium   100 mg Oral BID   enoxaparin  (LOVENOX ) injection  30 mg Subcutaneous Q24H   feeding supplement  237 mL Oral BID BM   metoprolol  tartrate  25 mg Oral BID   midodrine   5 mg Oral TID WC   pantoprazole    40 mg Oral BID   acetaminophen , albuterol , dextromethorphan-guaiFENesin , hydrALAZINE , ondansetron  (ZOFRAN ) IV  Assessment/ Plan:  [name] is a 89 y.o.  female past medical conditions including hypertension, hyperlipidemia, GERD, atrial fibrillation, hard hearing, blindness, chronic kidney disease, who was admitted to [facility] on 06/14/2024 for Hypernatremia [E87.0] Hyponatremia [E87.1]     Hyponatremia, Sodium 114 on admission. Did receive 3% hypertonic saline during this admission.    Sodium 127 today. Will d/c NS and encourage oral protein intake. Daughter reports patient has chronic swallowing problems and was mostly drinking tea at home.    Chronic kidney disease stage III A.  Baseline appears to be 1.09 with GFR 46 on 12/01/2023.  Creatinine 1.35 on admission.  Creatinine has fluctuated but steadily risen some over the past day, possibly indicating dehydration. Treated with iv saline. Now Cr has started to improve. Will monitor.     LOS: 5 Kylie SHAUNNA [name] 7/12/20253:18 PM Patient was seen and examined with [name] Latin, NP.  Plan of care was formulated for the problems addressed and discussed with the nurse practitioner. I agree with the note as documented except as noted below.

## 2024-06-19 NOTE — Progress Notes (Signed)
 Progress Note   Patient: [name] FMW:[id number] DOB: 08-Apr-1925 DOA: 06/14/2024     5 DOS: the patient was seen and examined on 06/19/2024   Brief hospital course: 89 y.o. female with medical history significant of dCHF, HTN, HLD, PMM due to sinus pause 2007, GERD, CKD-CVA, A-fib, blindness, HOH, who presents with SOB and abnormal labs with hyponatremia, initial sodium level 114.  Patient was admitted with Nephrology consulted for further evaluation and management. Hypertonic saline was started initially with improvement in sodium level up to 121 >> 125. To prevent overcorrection, 3% saline was stopped.  Patient is also being treated with IV Lasix  for hypervolemia.  CT chest was negative for any lung masses.  Palliative care was consulted for goals of care discussions.  Patient and family are considering palliative vs hospice care upon discharge.  Further hospital course and management as outlined below.    Assessment and Plan:  Hyponatremia:  Patient has hypervolemic hyponatremia. Her sodium is 117 in ED. s/p 3% hypertonic saline and IV Lasix .  Initially treated with hypertonic saline. --Nephrology following --Off 3% saline --Sodium improved 114 >> 124 (3% stopped) >> 125 >> 124 >> 127 >> 125 >> 126 (fluids started) >> 127 --CT chest negative for lung masses --Serial BMP's to monitor --Holding Lasix  today due to rising Cr --Nephrology considering gentle IV hydration as PO intake has been poor   Acute on chronic diastolic CHF (congestive heart failure) (HCC): 2D echo on 06/03/2023 showed EF 55 to 60%.  Patient has SOB, elevated BNP 770, positive JVD and increased interstitial opacity on chest x-ray, clinically consistent with CHF exacerbation. Bilateral pleural effusions - noted on CT chest --Holding diuresis with Cr rising --Daily weights, strict I/O's --Low sodium diet & fluid restriction  AKI superimposed on CKD stage 3a [facility]): Recent baseline creatinine 1.10.   Cr has  increased in setting of diuresis, 1.09 >> 1.14 >> 1.34 >> 1.42 (gentle fluids started) >> 1.34 - Hold diuresis - Hold Cozaar , metolazone  and torsemide  as above - Nephrology following - Started on midodrine  for soft BP's - Ensure adequate renal perfusion    Acute respiratory failure with hypoxia - due to pleural effusions/CHF and underlying chronic lung disease and bronchiectasis (as seen on CT chest).  No prior home oxygen use.  Pt has been on 2-3 L/min here and endorses shortness of breath. --Supplement O2 to keep sats > 90% --Home O2 qualification today - pt meets criteria, will order home O2 upon d/c  Bronchiectasis, Chronic interstitial lung disease -- as per CT chest findings --PRN bronchodilators --PRN Mucinex  --O2 as above    Primary hypertension: pt with soft and low BP's here -Hold amlodipine , Cozaar , metolazone  and torsemide  due to softer blood pressure. -Metoprolol  dose and changed to tartrate for crushing in applesauce -Started midodrine  5 mg TID due to low BP's - IV hydralazine  as needed   HLD (hyperlipidemia) -Lipitor   Paroxysmal atrial fibrillation [facility]): Patient is not taking anticoagulants currently due to history of macular degeneration and fall risk . Heart rate 60-80s -lowered dose of metoprolol  from 200 >> 50 mg daily, hold if MAP< 65    Goals of care Per Dr. Maree: As per daughter/Wanda/POA, patient has been active until about a month ago.  She was attending daycare center and walking about 30 minutes/day but for about a month now she is not doing much and staying at home.  She is open and interested in exploring possible hospice versus palliative approach to keep her comfortable  at home.  Daughter shared that patient is blind and has hearing loss making it very difficult to manage at times --Palliative care and [facility] hospice liaison are following      Subjective: Pt seen with daughter at bedside on rounds.  Pt was awake laying in bed, and more  talkative today that prior days.  She reports feeling terrible but much better than she was.  No nausea/vomiting. Denies feeling short of breath.     Physical Exam: Vitals:   06/18/24 2027 06/19/24 0448 06/19/24 0808 06/19/24 1200  BP: (!) 124/56 (!) 101/56 134/67 (!) 130/58  Pulse: 65 61 65   Resp: 18 18 18    Temp: 97.8 F (36.6 C) 97.6 F (36.4 C) 98.1 F (36.7 C)   TempSrc:      SpO2: 96% 92% 100%   Weight:  55.6 kg    Height:       General exam: awake laying in bed, more talkative today, no acute distress HEENT: moist mucus membranes, hard of hearing Respiratory system: lungs clear diminished bases, no rhonchi or wheezes, normal respiratory effort at rest on 2 L/min [place] O2 Cardiovascular system: normal S1/S2, RRR, no pedal edema.   Gastrointestinal system: soft, NT, ND Central nervous system: A&O x 2+. no gross focal neurologic deficits, normal speech Extremities: moves all, no edema, normal tone Skin: dry, intact, normal temperature Psychiatry: normal mood, congruent affect   Data Reviewed:  Notable labs -- Na 125 >> 126, Cl 84, BUN 88, Cr 1.42   Family Communication: daughter at bedside on rounds daily  Disposition: Status is: Inpatient Remains inpatient appropriate because: persistent hyponatremia, AKI, poor PO intake, new oxygen requirement    Planned Discharge Destination: Home with Home Health    Time spent: 42 minutes  Author: Burnard DELENA Cunning, DO 06/19/2024 2:22 PM  For on call review [email or web address].

## 2024-06-20 ENCOUNTER — Inpatient Hospital Stay

## 2024-06-20 DIAGNOSIS — E871 Hypo-osmolality and hyponatremia: Secondary | ICD-10-CM | POA: Diagnosis not present

## 2024-06-20 LAB — BASIC METABOLIC PANEL WITH GFR
Anion gap: 8 (ref 5–15)
BUN: 77 mg/dL — ABNORMAL HIGH (ref 8–23)
CO2: 30 mmol/L (ref 22–32)
Calcium: 9 mg/dL (ref 8.9–10.3)
Chloride: 90 mmol/L — ABNORMAL LOW (ref 98–111)
Creatinine, Ser: 1.1 mg/dL — ABNORMAL HIGH (ref 0.44–1.00)
GFR, Estimated: 46 mL/min — ABNORMAL LOW (ref >60)
Glucose, Bld: 113 mg/dL — ABNORMAL HIGH (ref 70–99)
Potassium: 4.5 mmol/L (ref 3.5–5.1)
Sodium: 128 mmol/L — ABNORMAL LOW (ref 135–145)

## 2024-06-20 MED ORDER — POLYVINYL ALCOHOL 1.4 % OP SOLN
1.0000 [drp] | OPHTHALMIC | Status: DC | PRN
  Filled 2024-06-20: qty 15

## 2024-06-20 MED ORDER — MUSCLE RUB 10-15 % EX CREA
1.0000 | TOPICAL_CREAM | CUTANEOUS | Status: DC | PRN
  Administered 2024-06-21: 1 via TOPICAL
  Filled 2024-06-20: qty 85

## 2024-06-20 NOTE — Plan of Care (Signed)

## 2024-06-20 NOTE — Plan of Care (Signed)
  Problem: Education: Goal: Knowledge of General Education information will improve Description: Including pain rating scale, medication(s)/side effects and non-pharmacologic comfort measures Outcome: Progressing   Problem: Education: Goal: Ability to demonstrate management of disease process will improve Outcome: Progressing Goal: Ability to verbalize understanding of medication therapies will improve Outcome: Progressing Goal: Individualized Educational Video(s) Outcome: Progressing   Problem: Activity: Goal: Capacity to carry out activities will improve Outcome: Progressing   

## 2024-06-20 NOTE — Progress Notes (Signed)
 Mobility Specialist - Progress Note   Pre-mobility: HR, BP, SpO2(88%) pushed to 2.5L During mobility: HR, BP(117/81), SpO2(93) pushed to 4L Post-mobility: HR, BP, SPO2(92) back on 2.5L     06/20/24 1200  Mobility  Activity Ambulated with assistance in hallway  Level of Assistance Minimal assist, patient does 75% or more  Assistive Device Front wheel walker  Distance Ambulated (ft) 15 ft  Range of Motion/Exercises Active  Activity Response Tolerated well  Mobility Referral Yes  Mobility visit 1 Mobility  Mobility Specialist Start Time (ACUTE ONLY) 1052  Mobility Specialist Stop Time (ACUTE ONLY) 1116  Mobility Specialist Time Calculation (min) (ACUTE ONLY) 24 min   Pt resting in bed on 2L upon entry (88%) . Pt endorses feeling SOB and pushed to 2.5L to aid in breathing. Pt moved to EOB MinA and endorses dizziness while sitting. Pt dizziness subsides and pt STS and ambulates outside door MinA with RW. Pt took x1 seated rest break for 4 minutes before returned to bed . Pt left in bed with needs in reach and bed alarm activated.   Guido Rumble Mobility Specialist 06/20/24, 12:29 PM

## 2024-06-20 NOTE — Progress Notes (Signed)
 [facility]  ROUNDING NOTE   Subjective:  [name] is a 89 y.o.  female past medical conditions including hypertension, hyperlipidemia, GERD, atrial fibrillation, hard hearing, blindness, chronic kidney disease, who was admitted to [facility] on 06/14/2024 for Hypernatremia [E87.0] Hyponatremia [E87.1] Update: Patient eating breakfast with daughter assisting. No acute complaints.  Sodium 128, creatinine back to baseline  Objective:  Vital signs in last 24 hours:  Temp:  [97.7 F (36.5 C)-98.2 F (36.8 C)] 97.9 F (36.6 C) (07/13 0840) Pulse Rate:  [63-73] 64 (07/13 0840) Resp:  [20-21] 20 (07/13 0840) BP: (127-145)/(59-73) 145/65 (07/13 0840) SpO2:  [90 %-95 %] 93 % (07/13 0840) Weight:  [55.9 kg] 55.9 kg (07/13 0500)  Weight change: 0.3 kg Filed Weights   06/18/24 0500 06/19/24 0448 06/20/24 0500  Weight: 56.2 kg 55.6 kg 55.9 kg    Intake/Output: I/O last 3 completed shifts: In: 1320.6 [P.O.:480; I.V.:840.6] Out: -    Intake/Output this shift:  Total I/O In: 120 [P.O.:120] Out: -   Physical Exam: General: No acute distress  Head: Normocephalic  Eyes: Anicteric  Neck: Supple  Lungs:  Normal effort  Heart: Regular rate and rhythm  Abdomen:  Soft, nontender,   Extremities:  Trace peripheral edema.  Neurologic: Alert and awake  Skin: No lesions  Access: None    Basic Metabolic Panel: Recent Labs  Lab 06/14/24 1933 06/14/24 2057 06/15/24 0434 06/15/24 0848 06/16/24 1024 06/17/24 0423 06/18/24 0540 06/19/24 0623 06/20/24 0331  NA 115*   < > 121*   < > 127* 125* 126* 127* 128*  K  --   --  3.6  --  4.1 4.1 4.3 4.1 4.5  CL  --   --  82*  --  85* 85* 84* 87* 90*  CO2  --   --  27  --  32 31 32 32 30  GLUCOSE  --   --  96  --  107* 113* 99 100* 113*  BUN  --   --  90*  --  79* 85* 88* 85* 77*  CREATININE  --   --  1.09*  --  1.14* 1.34* 1.42* 1.34* 1.10*  CALCIUM   --   --  8.7*  --  8.9 8.6* 9.0 8.9 9.0  MG  --   --  2.3  --   --   --   --    --   --   PHOS 3.8  --   --   --   --   --   --   --   --    < > = values in this interval not displayed.    Liver Function Tests: No results for input(s): AST, ALT, ALKPHOS, BILITOT, PROT, ALBUMIN in the last 168 hours. No results for input(s): LIPASE, AMYLASE in the last 168 hours. No results for input(s): AMMONIA in the last 168 hours.  CBC: Recent Labs  Lab 06/14/24 1731 06/15/24 0434 06/19/24 0623  WBC 5.5 3.5* 4.1  HGB 12.7 12.5 11.3*  HCT 35.8* 35.9* 34.6*  MCV 95.2 96.8 101.2*  PLT 206 186 178    Cardiac Enzymes: No results for input(s): CKTOTAL, CKMB, CKMBINDEX, TROPONINI in the last 168 hours.  BNP: Invalid input(s): POCBNP  CBG: No results for input(s): GLUCAP in the last 168 hours.  Microbiology: Results for orders placed or performed in visit on 04/24/22  Microscopic Examination     Status: None   Collection Time: 04/24/22  2:26 PM  Urine  Result Value Ref Range Status   WBC, UA 0-5 0 - 5 /hpf Final   RBC, Urine 0-2 0 - 2 /hpf Final   Epithelial Cells (non renal) None seen 0 - 10 /hpf Final   Bacteria, UA None seen None seen/Few Final    Coagulation Studies: No results for input(s): LABPROT, INR in the last 72 hours.  Urinalysis: No results for input(s): COLORURINE, LABSPEC, PHURINE, GLUCOSEU, HGBUR, BILIRUBINUR, KETONESUR, PROTEINUR, UROBILINOGEN, NITRITE, LEUKOCYTESUR in the last 72 hours.  Invalid input(s): APPERANCEUR    Imaging: No results found.   Medications:     atorvastatin   10 mg Oral Daily   docusate sodium   100 mg Oral BID   enoxaparin  (LOVENOX ) injection  30 mg Subcutaneous Q24H   feeding supplement  237 mL Oral BID BM   metoprolol  tartrate  25 mg Oral BID   midodrine   5 mg Oral TID WC   pantoprazole   40 mg Oral BID   acetaminophen , albuterol , dextromethorphan-guaiFENesin , hydrALAZINE , ondansetron  (ZOFRAN ) IV  Assessment/ Plan:  [name] is a 89 y.o.   female  past medical conditions including hypertension, hyperlipidemia, GERD, atrial fibrillation, hard hearing, blindness, chronic kidney disease, who was admitted to [facility] on 06/14/2024 for Hypernatremia [E87.0] Hyponatremia [E87.1]     Hyponatremia, Sodium 114 on admission. Did receive 3% hypertonic saline during this admission.    Sodium 128 today. Patient is now eating more with assistance,   Chronic kidney disease stage III A.  Baseline appears to be 1.09 with GFR 46 on 12/01/2023.  Creatinine 1.35 on admission.        Creatinine 1.10 today, appears back to baseline.       LOS: 6 Jaciel Diem SHAUNNA Dines 7/13/202512:01 PM

## 2024-06-20 NOTE — Progress Notes (Signed)
 Progress Note   Patient: [name] FMW:[id number] DOB: 26-May-1925 DOA: 06/14/2024     6 DOS: the patient was seen and examined on 06/20/2024   Brief hospital course: 89 y.o. female with medical history significant of dCHF, HTN, HLD, PMM due to sinus pause 2007, GERD, CKD-CVA, A-fib, blindness, HOH, who presents with SOB and abnormal labs with hyponatremia, initial sodium level 114.  Patient was admitted with Nephrology consulted for further evaluation and management. Hypertonic saline was started initially with improvement in sodium level up to 121 >> 125. To prevent overcorrection, 3% saline was stopped.  Patient is also being treated with IV Lasix  for hypervolemia.  CT chest was negative for any lung masses.  Palliative care was consulted for goals of care discussions.  Patient and family are considering palliative vs hospice care upon discharge.  Further hospital course and management as outlined below.    Assessment and Plan:  Hyponatremia:  Patient has hypervolemic hyponatremia. Her sodium is 117 in ED. s/p 3% hypertonic saline and IV Lasix .  Initially treated with hypertonic saline. --Nephrology following --Off 3% saline --Sodium improved 114 >> 124 (3% stopped) >> 125 >> 124 >> 127 >> 125 >> 126 (fluids started) >> 127 >> 128 --Monitor off diuretics and fluids  --CT chest negative for lung masses --Serial BMP's to monitor --Nephrology considering gentle IV hydration as PO intake has been poor   Acute on chronic diastolic CHF (congestive heart failure) (HCC): 2D echo on 06/03/2023 showed EF 55 to 60%.  Patient has SOB, elevated BNP 770, positive JVD and increased interstitial opacity on chest x-ray, clinically consistent with CHF exacerbation. Bilateral pleural effusions - noted on CT chest --Holding diuresis with Cr rising --Daily weights, strict I/O's --Low sodium diet & fluid restriction  AKI superimposed on CKD stage 3a [facility]): Recent baseline creatinine 1.10.   Cr has  increased in setting of diuresis, 1.09 >> 1.14 >> 1.34 >> 1.42 (gentle fluids started) >> 1.34 >> 1.10 - Hold diuresis - Hold Cozaar , metolazone  and torsemide  as above - Nephrology following - Started on midodrine  for soft BP's - Ensure adequate renal perfusion    Acute respiratory failure with hypoxia - due to pleural effusions/CHF and underlying chronic lung disease and bronchiectasis (as seen on CT chest).  No prior home oxygen use.  Pt has been on 2-3 L/min here and endorses shortness of breath. --Supplement O2 to keep sats > 90% --Home O2 qualification today - pt meets criteria, will order home O2 upon d/c  Bronchiectasis, Chronic interstitial lung disease -- as per CT chest findings --PRN bronchodilators --PRN Mucinex  --O2 as above    Primary hypertension: pt with soft and low BP's here -Hold amlodipine , Cozaar , metolazone  and torsemide  due to softer blood pressure. -Metoprolol  dose and changed to tartrate for crushing in applesauce -Started midodrine  5 mg TID due to low BP's - IV hydralazine  as needed   HLD (hyperlipidemia) -Lipitor   Paroxysmal atrial fibrillation [facility]): Patient is not taking anticoagulants currently due to history of macular degeneration and fall risk . Heart rate 60-80s -lowered dose of metoprolol  from 200 >> 50 mg daily, hold if MAP< 65    Goals of care Per Dr. Maree: As per daughter/Wanda/POA, patient has been active until about a month ago.  She was attending daycare center and walking about 30 minutes/day but for about a month now she is not doing much and staying at home.  She is open and interested in exploring possible hospice versus palliative approach to  keep her comfortable at home.  Daughter shared that patient is blind and has hearing loss making it very difficult to manage at times --Palliative care and [facility] hospice liaison are following      Subjective: Pt seen with daughter at bedside on rounds.  Pt's hearing aids did not charge  overnight, so communication more difficult this AM.  No acute events reported.  Daughter reports that yesterday evening, when attempting to stand to get to bedside commode, patient's legs were extremely weak and she wasn't able to stand on them.  This is new, patient has been ambulatory with walker this admission.     Physical Exam: Vitals:   06/20/24 0516 06/20/24 0838 06/20/24 0840 06/20/24 1200  BP: (!) 141/59  (!) 145/65 117/81  Pulse: 73  64   Resp:   20   Temp: 98 F (36.7 C)  97.9 F (36.6 C)   TempSrc: Axillary  Oral   SpO2: 90% 90% 93% 93%  Weight:      Height:       General exam: awake laying in bed, more talkative today, no acute distress HEENT: moist mucus membranes, very hard of hearing Respiratory system: lungs clear, normal respiratory effort at rest on 2 L/min [place] O2 Cardiovascular system: normal S1/S2, RRR, no pedal edema.   Gastrointestinal system: soft, NT, ND Central nervous system: A&O x 2+. no gross focal neurologic deficits, normal speech, intact 5/5 and symmetric lower extremity motor Extremities: moves all, no edema, normal tone Skin: dry, intact, normal temperature Psychiatry: normal mood, congruent affect   Data Reviewed:  Notable labs -- Na 125 >> 126 >> 127 >> 128, Cl 90, BUN 77, Cr 1.34 >> 1.10   Family Communication: daughter at bedside on rounds daily  Disposition: Status is: Inpatient Remains inpatient appropriate because: persistent hyponatremia, AKI, poor PO intake.  Needs further improvement before discharge. Anticipate 1-2 more days.    Planned Discharge Destination: Home with Home Health    Time spent: 40 minutes  Author: Burnard DELENA Cunning, DO 06/20/2024 12:29 PM  For on call review [email or web address].

## 2024-06-21 ENCOUNTER — Inpatient Hospital Stay

## 2024-06-21 ENCOUNTER — Telehealth: Payer: Self-pay | Admitting: Family Medicine

## 2024-06-21 DIAGNOSIS — E871 Hypo-osmolality and hyponatremia: Secondary | ICD-10-CM | POA: Diagnosis not present

## 2024-06-21 LAB — BASIC METABOLIC PANEL WITH GFR
Anion gap: 8 (ref 5–15)
BUN: 60 mg/dL — ABNORMAL HIGH (ref 8–23)
CO2: 32 mmol/L (ref 22–32)
Calcium: 9.4 mg/dL (ref 8.9–10.3)
Chloride: 92 mmol/L — ABNORMAL LOW (ref 98–111)
Creatinine, Ser: 0.69 mg/dL (ref 0.44–1.00)
GFR, Estimated: >60 mL/min (ref >60)
Glucose, Bld: 127 mg/dL — ABNORMAL HIGH (ref 70–99)
Potassium: 4.7 mmol/L (ref 3.5–5.1)
Sodium: 132 mmol/L — ABNORMAL LOW (ref 135–145)

## 2024-06-21 LAB — CK: Total CK: 277 U/L — ABNORMAL HIGH (ref 38–234)

## 2024-06-21 MED ORDER — METHOCARBAMOL 500 MG PO TABS: 500.0000 mg | ORAL_TABLET | Freq: Once | ORAL | Status: DC | PRN

## 2024-06-21 NOTE — Care Management Important Message (Signed)
 Important Message  Patient Details  Name: [name] MRN: [id number] Date of Birth: [birth year]   Important Message Given:  Yes - Medicare IM     Brianna Bennett W, CMA 06/21/2024, 12:24 PM

## 2024-06-21 NOTE — Plan of Care (Signed)
 Pt c/o right hip and upper thigh pain when moving and turning in the bed. Pain was hindering walking or standing for mobility last night.  PRN medication along with muscle rub, ice pack, and repositioning done to provide relief. Night covering provider notified, X-ray ordered by provider. Per X-ray tech, they will come this morning to take patient for x-ray.  Problem: Education: Goal: Knowledge of General Education information will improve Description: Including pain rating scale, medication(s)/side effects and non-pharmacologic comfort measures Outcome: Progressing   Problem: Education: Goal: Ability to demonstrate management of disease process will improve Outcome: Progressing   Problem: Cardiac: Goal: Ability to achieve and maintain adequate cardiopulmonary perfusion will improve Outcome: Progressing

## 2024-06-21 NOTE — Progress Notes (Signed)
 Progress Note   Patient: [name] FMW:[id number] DOB: 1925/05/14 DOA: 06/14/2024     7 DOS: the patient was seen and examined on 06/21/2024   Brief hospital course: 89 y.o. female with medical history significant of dCHF, HTN, HLD, PMM due to sinus pause 2007, GERD, CKD-CVA, A-fib, blindness, HOH, who presents with SOB and abnormal labs with hyponatremia, initial sodium level 114.  Patient was admitted with Nephrology consulted for further evaluation and management. Hypertonic saline was started initially with improvement in sodium level up to 121 >> 125. To prevent overcorrection, 3% saline was stopped.  Patient is also being treated with IV Lasix  for hypervolemia.  CT chest was negative for any lung masses.  Palliative care was consulted for goals of care discussions.  Patient and family are considering palliative vs hospice care upon discharge.  Further hospital course and management as outlined below.    Assessment and Plan:  Hyponatremia:  Patient has hypervolemic hyponatremia. Her sodium is 117 in ED. s/p 3% hypertonic saline and IV Lasix .  Initially treated with hypertonic saline. --Nephrology following --Off 3% saline --Sodium improved 114 >> 124 (3% stopped) >> 125 >> 124 >> 127 >> 125 >> 126 (fluids started) >> 127 >> 128 >> 132 --Monitoring off diuretics and fluids  --Will defer resuming of diuretics to nephrology --CT chest negative for lung masses --Serial BMP's to monitor --Nephrology considering gentle IV hydration as PO intake has been poor   Acute on chronic diastolic CHF (congestive heart failure) (HCC): 2D echo on 06/03/2023 showed EF 55 to 60%.  Patient has SOB, elevated BNP 770, positive JVD and increased interstitial opacity on chest x-ray, clinically consistent with CHF exacerbation. Bilateral pleural effusions - noted on CT chest --Holding diuresis as above --Daily weights, strict I/O's --Low sodium diet & fluid restriction  AKI superimposed on CKD stage 3a  [facility]): Recent baseline creatinine 1.10.   Cr has increased in setting of diuresis, 1.09 >> 1.14 >> 1.34 >> 1.42 (gentle fluids started) >> 1.34 >> 1.10 - Hold diuresis - Hold Cozaar , metolazone  and torsemide  as above - Nephrology following - Started on midodrine  for soft BP's - Ensure adequate renal perfusion  Right thigh pain -- DVT U/S negative.  Right femur and pelvis x-rays negative.  No injury or trauma. On exam, mild tenderness on mid-proximal anterior right thigh, ?hypertonic muscle on palpation, no erythema or ecchymosis or palpable fluctuance.   --See how pt does with PT today --Consider low dose Robaxin  (?muscle spasm) --Consider soft tissue ultrasound    Acute respiratory failure with hypoxia - due to pleural effusions/CHF and underlying chronic lung disease and bronchiectasis (as seen on CT chest).  No prior home oxygen use.  Pt has been on 2-3 L/min here and endorses shortness of breath. --Supplement O2 to keep sats > 90% --Home O2 qualification today - pt meets criteria, will order home O2 upon d/c  Bronchiectasis, Chronic interstitial lung disease -- as per CT chest findings --PRN bronchodilators --PRN Mucinex  --O2 as above    Primary hypertension: pt with soft and low BP's here -Hold amlodipine , Cozaar , metolazone  and torsemide  due to softer blood pressure. -Metoprolol  dose and changed to tartrate for crushing in applesauce -Started midodrine  5 mg TID due to low BP's - IV hydralazine  as needed   HLD (hyperlipidemia) -Lipitor   Paroxysmal atrial fibrillation [facility]): Patient is not taking anticoagulants currently due to history of macular degeneration and fall risk . Heart rate 60-80s -lowered dose of metoprolol  from 200 >> 50  mg daily, hold if MAP< 65    Goals of care Per Dr. Maree: As per daughter/Wanda/POA, patient has been active until about a month ago.  She was attending daycare center and walking about 30 minutes/day but for about a month now she is not doing  much and staying at home.  She is open and interested in exploring possible hospice versus palliative approach to keep her comfortable at home.  Daughter shared that patient is blind and has hearing loss making it very difficult to manage at times --Palliative care and [facility] hospice liaison are following      Subjective: Pt seen with daughter at bedside on rounds.  Pt started reporting right thigh/hip pain yesterday evening.  U/S was done and showed no DVT.  No trauma or injury to the area, no bruising.  Pt reports it hurts more when she stays in the same position for a long time, feels like it would do better if she could move around more.     Physical Exam: Vitals:   06/21/24 0445 06/21/24 0754 06/21/24 0821 06/21/24 1149  BP: 130/61 136/75 134/65 124/60  Pulse: 61 76 75 60  Resp:  20 20   Temp: 97.6 F (36.4 C) 98.1 F (36.7 C) 98 F (36.7 C)   TempSrc: Oral  Oral   SpO2: 95% 97% 97% 96%  Weight: 56.9 kg     Height:       General exam: awake laying in bed, more talkative today, no acute distress HEENT: moist mucus membranes, very hard of hearing Respiratory system: lungs clear diminished bases, normal respiratory effort at rest on 2.5 L/min [place] O2 Cardiovascular system: normal S1/S2, RRR, no pedal edema.   Gastrointestinal system: soft, NT, ND Central nervous system: A&O x 2+. no gross focal neurologic deficits, normal speech Extremities: right anterior thigh without visible swelling or ecchymosis, no erythema or warmth, but area of mild tenderness on palpation Skin: dry, intact, normal temperature Psychiatry: normal mood, congruent affect   Data Reviewed:  Notable labs -- Na 128 >> 132, Cl 92, glucose 127, BUN 60   RLE U/S negative for DVT  X-rays of right femur and pelvis were negative.   Family Communication: daughter at bedside on rounds daily   Disposition: Status is: Inpatient Remains inpatient appropriate because: closely monitoring labs for stable  sodium and renal function, new onset right thigh pain under evaluation, need PT/OT follow ups to assess if home with [facility] is feasible, pt may need SNF placement.     Planned Discharge Destination: Home with Home Health    Time spent: 42 minutes  Author: Burnard DELENA Cunning, DO 06/21/2024 1:43 PM  For on call review [email or web address].

## 2024-06-21 NOTE — Progress Notes (Signed)
 Mobility Specialist - Progress Note   06/21/24 1152  Mobility  Activity Ambulated with assistance in room;Stood at bedside;Dangled on edge of bed  Level of Assistance Moderate assist, patient does 50-74%  Assistive Device Front wheel walker  Distance Ambulated (ft) 4 ft  Activity Response Tolerated well  Mobility visit 1 Mobility     Pre-mobility: 71 HR, 96% SpO2 Post-mobility: 62 HR, 95% SpO2   Pt lying in bed upon arrival, utilizing 2.5L. Pt agreeable to activity. Reports pain in R hip 5/10 pre mobility. Completed bed mobility with modA + assistance to move RLE towards EOB. STS x2 with minA +2 for safety. Pain increased to 10/10 during session. Pt took small steps towards HOB before returning supine. Pt left in bed with alarm set, needs in reach. Family at bedside.    Lennette Seip Mobility Specialist 06/21/24, 11:56 AM

## 2024-06-21 NOTE — Progress Notes (Signed)
 [facility]  ROUNDING NOTE   Subjective:  [name] is a 89 y.o.  female past medical conditions including hypertension, hyperlipidemia, GERD, atrial fibrillation, hard hearing, blindness, chronic kidney disease, who was admitted to [facility] on 06/14/2024 for Hypernatremia [E87.0] Hyponatremia [E87.1]  Update: Patient sitting up in bed Alert Receiving assistance with breakfast  Sodium 132  Objective:  Vital signs in last 24 hours:  Temp:  [97.6 F (36.4 C)-98.1 F (36.7 C)] 98 F (36.7 C) (07/14 0821) Pulse Rate:  [61-76] 75 (07/14 0821) Resp:  [20-22] 20 (07/14 0821) BP: (117-136)/(60-86) 134/65 (07/14 0821) SpO2:  [92 %-97 %] 97 % (07/14 0821) Weight:  [56.9 kg] 56.9 kg (07/14 0445)  Weight change: 1 kg Filed Weights   06/19/24 0448 06/20/24 0500 06/21/24 0445  Weight: 55.6 kg 55.9 kg 56.9 kg    Intake/Output: I/O last 3 completed shifts: In: 840 [P.O.:840] Out: -    Intake/Output this shift:  No intake/output data recorded.  Physical Exam: General: No acute distress  Head: Normocephalic  Eyes: Anicteric  Neck: Supple  Lungs:  Normal effort  Heart: Regular rate and rhythm  Abdomen:  Soft, nontender,   Extremities:  Trace peripheral edema.  Neurologic: Alert and awake  Skin: No lesions  Access: None    Basic Metabolic Panel: Recent Labs  Lab 06/14/24 1933 06/14/24 2057 06/15/24 0434 06/15/24 0848 06/17/24 0423 06/18/24 0540 06/19/24 0623 06/20/24 0331 06/21/24 0550  NA 115*   < > 121*   < > 125* 126* 127* 128* 132*  K  --   --  3.6   < > 4.1 4.3 4.1 4.5 4.7  CL  --   --  82*   < > 85* 84* 87* 90* 92*  CO2  --   --  27   < > 31 32 32 30 32  GLUCOSE  --   --  96   < > 113* 99 100* 113* 127*  BUN  --   --  90*   < > 85* 88* 85* 77* 60*  CREATININE  --   --  1.09*   < > 1.34* 1.42* 1.34* 1.10* 0.69  CALCIUM   --   --  8.7*   < > 8.6* 9.0 8.9 9.0 9.4  MG  --   --  2.3  --   --   --   --   --   --   PHOS 3.8  --   --   --   --   --    --   --   --    < > = values in this interval not displayed.    Liver Function Tests: No results for input(s): AST, ALT, ALKPHOS, BILITOT, PROT, ALBUMIN in the last 168 hours. No results for input(s): LIPASE, AMYLASE in the last 168 hours. No results for input(s): AMMONIA in the last 168 hours.  CBC: Recent Labs  Lab 06/14/24 1731 06/15/24 0434 06/19/24 0623  WBC 5.5 3.5* 4.1  HGB 12.7 12.5 11.3*  HCT 35.8* 35.9* 34.6*  MCV 95.2 96.8 101.2*  PLT 206 186 178    Cardiac Enzymes: No results for input(s): CKTOTAL, CKMB, CKMBINDEX, TROPONINI in the last 168 hours.  BNP: Invalid input(s): POCBNP  CBG: No results for input(s): GLUCAP in the last 168 hours.  Microbiology: Results for orders placed or performed in visit on 04/24/22  Microscopic Examination     Status: None   Collection Time: 04/24/22  2:26 PM  Urine  Result Value Ref Range Status   WBC, UA 0-5 0 - 5 /hpf Final   RBC, Urine 0-2 0 - 2 /hpf Final   Epithelial Cells (non renal) None seen 0 - 10 /hpf Final   Bacteria, UA None seen None seen/Few Final    Coagulation Studies: No results for input(s): LABPROT, INR in the last 72 hours.  Urinalysis: No results for input(s): COLORURINE, LABSPEC, PHURINE, GLUCOSEU, HGBUR, BILIRUBINUR, KETONESUR, PROTEINUR, UROBILINOGEN, NITRITE, LEUKOCYTESUR in the last 72 hours.  Invalid input(s): APPERANCEUR    Imaging: DG Pelvis 1-2 Views Result Date: 06/21/2024 CLINICAL DATA:  Right hip and upper thigh pain when moving or turning in bed. No trauma history is submitted. EXAM: PELVIS - 1-2 VIEW COMPARISON:  None Available. FINDINGS: Single AP view of the pelvis demonstrates vascular calcifications. Femoral heads are located. Joint spaces are maintained for age. Degenerative sclerosis of the bilateral sacroiliac joints. IMPRESSION: No acute osseous abnormality. Electronically Signed   By: Rockey Kilts M.D.   On: 06/21/2024  10:20   DG FEMUR, MIN 2 VIEWS RIGHT Result Date: 06/21/2024 CLINICAL DATA:  Acute right leg pain without reported injury. EXAM: RIGHT FEMUR 2 VIEWS COMPARISON:  None Available. FINDINGS: There is no evidence of fracture or other focal bone lesions. Soft tissues are unremarkable. IMPRESSION: Negative. Electronically Signed   By: Lynwood Landy Raddle M.D.   On: 06/21/2024 10:13   US  Venous Img Lower Unilateral Right (DVT) Result Date: 06/21/2024 CLINICAL DATA:  [id number] Right leg swelling [id number] EXAM: RIGHT LOWER EXTREMITY VENOUS DOPPLER ULTRASOUND TECHNIQUE: Gray-scale sonography with compression, as well as color and duplex ultrasound, were performed to evaluate the deep venous system(s) from the level of the common femoral vein through the popliteal and proximal calf veins. COMPARISON:  Lower extremity venous duplex, 03/12/2024. FINDINGS: VENOUS Normal compressibility of the RIGHT common femoral, superficial femoral, and popliteal veins, as well as the visualized calf veins. Visualized portions of profunda femoral vein and great saphenous vein unremarkable. No filling defects to suggest DVT on grayscale or color Doppler imaging. Doppler waveforms show normal direction of venous flow, normal respiratory plasticity and response to augmentation. Limited views of the contralateral common femoral vein are unremarkable. OTHER No evidence of superficial thrombophlebitis or abnormal fluid collection. Limitations: none IMPRESSION: No evidence of femoropopliteal DVT or superficial thrombophlebitis within the RIGHT lower extremity. Thom Hall, MD [facility] Radiology Electronically Signed   By: Thom Hall M.D.   On: 06/21/2024 07:00     Medications:     atorvastatin   10 mg Oral Daily   docusate sodium   100 mg Oral BID   enoxaparin  (LOVENOX ) injection  30 mg Subcutaneous Q24H   feeding supplement  237 mL Oral BID BM   metoprolol  tartrate  25 mg Oral BID    midodrine   5 mg Oral TID WC   pantoprazole   40 mg Oral BID   acetaminophen , albuterol , artificial tears, dextromethorphan-guaiFENesin , hydrALAZINE , Muscle Rub, ondansetron  (ZOFRAN ) IV  Assessment/ Plan:  [name] is a 89 y.o.  female  past medical conditions including hypertension, hyperlipidemia, GERD, atrial fibrillation, hard hearing, blindness, chronic kidney disease, who was admitted to [facility] on 06/14/2024 for Hypernatremia [E87.0] Hyponatremia [E87.1]     Hyponatremia, Sodium 114 on admission. Did receive 3% hypertonic saline during this admission.    S sodium has improved to 132 with improved oral intake.    Chronic kidney disease stage III A.  Baseline appears to be 1.09 with  GFR 46 on 12/01/2023.  Creatinine 1.35 on admission.        Creatinine has returned to baseline       LOS: 7 Richele Strand 7/14/202511:29 AM

## 2024-06-21 NOTE — Progress Notes (Signed)
 Occupational Therapy Treatment Patient Details Name: [name] MRN: [id number] DOB: 08-20-1925 Today's Date: 06/21/2024   History of present illness Carilyn Woolston is a 89yoF who [facility]  c SOB. PMH: CHF, HTN, HLD, CKD, CVA, AF, blindness, HOH. DTR reports recent LEE as well. In ED pt with intermittent confusion, fatigue, imbalance- workup revealing of Na+ 114, BP 84/55mmHg.   OT comments  Ms. Chavers was seen for OT/PT co-treatment on this date. Upon arrival to room pt supine in bed, agreeable to tx session despite ongoing RLE pain with mobility. OT facilitated ADL management as described below. See ADL section for additional details regarding occupational performance. Pt continues to be functionally limited by generalized weakness, decreased activity tolerance, and increased pain with mobility attempts. Pt/caregivers return verbalizes understanding of education provided t/o session. Pt continues to benefit from skilled OT services to maximize return to PLOF and minimize risk of future falls, injury, caregiver burden, and readmission. Will continue to follow POC as written. Discharge recommendation remains appropriate.        If plan is discharge home, recommend the following:  A little help with walking and/or transfers;A little help with bathing/dressing/bathroom;Help with stairs or ramp for entrance   Equipment Recommendations  BSC/3in1    Recommendations for Other Services      Precautions / Restrictions Precautions Precautions: Fall Recall of Precautions/Restrictions: Intact Precaution/Restrictions Comments: BLIND Restrictions Weight Bearing Restrictions Per Provider Order: No       Mobility Bed Mobility Overal bed mobility: Needs Assistance Bed Mobility: Supine to Sit, Sit to Supine     Supine to sit: Max assist, +2 for physical assistance, Total assist, HOB elevated Sit to supine: Max assist, +2 for physical assistance   General bed mobility comments:  limited by RLE pain with activity.    Transfers Overall transfer level: Needs assistance Equipment used: Rolling walker (2 wheels) Transfers: Sit to/from Stand             General transfer comment: takes a few effortful side-steps toward [facility] with +2 min/mod A.     Balance Overall balance assessment: Needs assistance Sitting-balance support: No upper extremity supported, Feet supported Sitting balance-Leahy Scale: Good Sitting balance - Comments: steady sitting, reaching inside BOS.   Standing balance support: During functional activity, Reliant on assistive device for balance, Bilateral upper extremity supported Standing balance-Leahy Scale: Fair                             ADL either performed or assessed with clinical judgement   ADL Overall ADL's : Needs assistance/impaired     Grooming: Sitting;Wash/dry face;Set up;Supervision/safety Grooming Details (indicate cue type and reason): Supervision for task following set-up with orientation of objects. Able to maintain static sitting balance at EOB to wash/dry face and apply lotion.                             Functional mobility during ADLs: Rolling walker (2 wheels);Cueing for safety;Minimal assistance;Moderate assistance;+2 for safety/equipment;+2 for physical assistance General ADL Comments: Increased assist for functional mobility 2/2 painful firm area on pt RLE (upper thigh). TOTAL A +2 for bed mobility, MIN-MOD A +2 for STS t/f and to take a few side steps toward HOB. Area is sensitive to the touch. MD cleared for OOB/mobility to further assess. Updated on progress at end of session.    Extremity/Trunk Assessment  Vision Baseline Vision/History: 2 Legally blind Ability to See in Adequate Light: 3 Highly impaired Patient Visual Report: No change from baseline     Perception     Praxis     Communication Communication Communication: No apparent difficulties Factors  Affecting Communication: Hearing impaired   Cognition Arousal: Alert Behavior During Therapy: WFL for tasks assessed/performed Cognition: No apparent impairments                               Following commands: Intact        Cueing   Cueing Techniques: Verbal cues, Tactile cues  Exercises Other Exercises Other Exercises: Pt/caregivers educated on role of OT in acute setting, safety, falls prevention and compensatory ADL management during ADLs as described above.    Shoulder Instructions       General Comments      Pertinent Vitals/ Pain       Pain Assessment Faces Pain Scale: Hurts whole lot Pain Location: RLE pain with movement/mobility Pain Descriptors / Indicators: Aching, Discomfort, Sore Pain Intervention(s): Limited activity within patient's tolerance, Monitored during session, Repositioned  Home Living                                          Prior Functioning/Environment              Frequency  Min 2X/week        Progress Toward Goals  OT Goals(current goals can now be found in the care plan section)  Progress towards OT goals: Not progressing toward goals - comment (decline in functional status 2/2 new onset RLE pain. Will continue to monitor.)  Acute Rehab OT Goals Patient Stated Goal: return home OT Goal Formulation: With patient Time For Goal Achievement: 06/30/24 Potential to Achieve Goals: Good  Plan      Co-evaluation    PT/OT/SLP Co-Evaluation/Treatment: Yes Reason for Co-Treatment: For patient/therapist safety;To address functional/ADL transfers PT goals addressed during session: Mobility/safety with mobility;Balance;Proper use of DME OT goals addressed during session: ADL's and self-care;Proper use of Adaptive equipment and DME      AM-PAC OT 6 Clicks Daily Activity     Outcome Measure   Help from another person eating meals?: A Little Help from another person taking care of personal grooming?:  A Little Help from another person toileting, which includes using toliet, bedpan, or urinal?: A Little Help from another person bathing (including washing, rinsing, drying)?: A Lot Help from another person to put on and taking off regular upper body clothing?: A Little Help from another person to put on and taking off regular lower body clothing?: A Lot 6 Click Score: 16    End of Session Equipment Utilized During Treatment: Gait belt;Rolling walker (2 wheels);Oxygen  OT Visit Diagnosis: Unsteadiness on feet (R26.81);Muscle weakness (generalized) (M62.81);History of falling (Z91.81);Pain Pain - Right/Left: Right Pain - part of body: Hip   Activity Tolerance Patient tolerated treatment well   Patient Left in bed;with bed alarm set;with call bell/phone within reach;with family/visitor present   Nurse Communication Mobility status        Time: 8566-8491 OT Time Calculation (min): 35 min  Charges: OT General Charges $OT Visit: 1 Visit OT Treatments $Self Care/Home Management : 8-22 mins  Jhonny Pelton, M.S., OTR/L 06/21/24, 3:54 PM

## 2024-06-21 NOTE — Telephone Encounter (Signed)
 [facility] faxed refill request for the following medications:   losartan  (COZAAR ) 50 MG tablet    Not on current medication list.  Looks like it was decreased to 25mg  by cardiology  Please advise.

## 2024-06-21 NOTE — Progress Notes (Signed)
 This patient is moaning in pain. Upon further conversation, she did mention fall 2 days prior to admission. Pain increased after walking with PT.  58 mins Mahima Shrestha, RN was removed by you. 56 mins JM Madison DELENA Peaches, MD Let us  x ray then please if there was trauma and fall 48 mins Can you please place the order the order? thanks 20 mins Madison DELENA Peaches, MD Please place it or my name JM To my name

## 2024-06-21 NOTE — Telephone Encounter (Signed)
 Please see [facility] request this medication was changed by you

## 2024-06-21 NOTE — Progress Notes (Signed)
 Physical Therapy Treatment Patient Details Name: [name] MRN: [id number] DOB: 02-10-1925 Today's Date: 06/21/2024   History of Present Illness [name] is a 89yoF who comes to [facility] on 06/14/24  c SOB. PMH: CHF, HTN, HLD, CKD, CVA, AF, blindness, HOH. DTR reports recent LEE as well. In ED pt with intermittent confusion, fatigue, imbalance- workup revealing of Na+ 114, BP 84/38mmHg.    PT Comments  Pt seen this pm with OT in order to provide safe mobility with new onset of R thigh pain and edema. Pt required increased assist for mobility this date. Grimacing and tearful upon sitting EOB due to pain in Right groin area. Pt had been r/o for DVT earlier. MD and nursing aware and currently addressing concerns. Pt session limited and assisted back to bed with all need in reach and family at bedside. Will reassess for any further testing to help explain new onset of pain.    If plan is discharge home, recommend the following: A little help with walking and/or transfers;Direct supervision/assist for medications management;Assist for transportation;Assistance with cooking/housework;Help with stairs or ramp for entrance   Can travel by private vehicle        Equipment Recommendations  None recommended by PT    Recommendations for Other Services       Precautions / Restrictions Precautions Precautions: Fall Recall of Precautions/Restrictions: Intact Precaution/Restrictions Comments: BLIND Restrictions Weight Bearing Restrictions Per Provider Order: No     Mobility  Bed Mobility Overal bed mobility: Needs Assistance Bed Mobility: Supine to Sit, Sit to Supine     Supine to sit: Max assist, +2 for physical assistance, Total assist, HOB elevated Sit to supine: Max assist, +2 for physical assistance   General bed mobility comments: limited by RLE pain with activity.    Transfers Overall transfer level: Needs assistance Equipment used: Rolling walker (2 wheels) Transfers: Sit to/from  Stand Sit to Stand: Mod assist           General transfer comment: takes a few effortful side-steps toward HOB with +2 min/mod A.    Ambulation/Gait               General Gait Details: deferred this date   Stairs             Wheelchair Mobility     Tilt Bed    Modified Rankin (Stroke Patients Only)       Balance Overall balance assessment: Needs assistance Sitting-balance support: No upper extremity supported, Feet supported Sitting balance-Leahy Scale: Good Sitting balance - Comments: steady sitting, reaching inside BOS.   Standing balance support: During functional activity, Reliant on assistive device for balance, Bilateral upper extremity supported Standing balance-Leahy Scale: Fair Standing balance comment:  (High Fall Risk)                            Communication Communication Communication: Impaired Factors Affecting Communication: Hearing impaired  Cognition Arousal: Alert Behavior During Therapy: WFL for tasks assessed/performed   PT - Cognitive impairments: No apparent impairments                         Following commands: Intact      Cueing Cueing Techniques: Verbal cues, Tactile cues  Exercises General Exercises - Lower Extremity Ankle Circles/Pumps: AROM, Both, 5 reps Heel Slides: AAROM, Right, 5 reps, Supine Hip ABduction/ADduction: AAROM, Right, 5 reps, Supine    General Comments General comments (skin  integrity, edema, etc.):  (New Right thigh edema and tenderness requiring incresed assist for mobility. Nursing/MD notified)      Pertinent Vitals/Pain Pain Assessment Pain Assessment: Faces Faces Pain Scale: Hurts whole lot Pain Location: RLE pain with movement/mobility Pain Descriptors / Indicators: Aching, Discomfort, Sore Pain Intervention(s): Limited activity within patient's tolerance    Home Living                          Prior Function            PT Goals (current goals can  now be found in the care plan section) Acute Rehab PT Goals Patient Stated Goal: get back to walkin' daily    Frequency    Min 2X/week      PT Plan      Co-evaluation PT/OT/SLP Co-Evaluation/Treatment: Yes Reason for Co-Treatment: For patient/therapist safety;To address functional/ADL transfers PT goals addressed during session: Mobility/safety with mobility;Balance;Proper use of DME OT goals addressed during session: ADL's and self-care;Proper use of Adaptive equipment and DME      AM-PAC PT 6 Clicks Mobility   Outcome Measure  Help needed turning from your back to your side while in a flat bed without using bedrails?: A Lot Help needed moving from lying on your back to sitting on the side of a flat bed without using bedrails?: A Lot Help needed moving to and from a bed to a chair (including a wheelchair)?: A Lot Help needed standing up from a chair using your arms (e.g., wheelchair or bedside chair)?: A Little Help needed to walk in hospital room?: A Lot Help needed climbing 3-5 steps with a railing? : A Lot 6 Click Score: 13    End of Session Equipment Utilized During Treatment: Oxygen Activity Tolerance: Patient limited by fatigue;Other (comment) Patient left: in bed;with call bell/phone within reach;with bed alarm set;with family/visitor present Nurse Communication: Mobility status;Other (comment) PT Visit Diagnosis: Unsteadiness on feet (R26.81);Repeated falls (R29.6);Other abnormalities of gait and mobility (R26.89)     Time: 8564-8491 PT Time Calculation (min) (ACUTE ONLY): 33 min  Charges:    $Therapeutic Activity: 8-22 mins PT General Charges $$ ACUTE PT VISIT: 1 Visit                    Darice Bohr, PTA  Darice JAYSON Bohr 06/21/2024, 4:38 PM

## 2024-06-22 DIAGNOSIS — E871 Hypo-osmolality and hyponatremia: Secondary | ICD-10-CM | POA: Diagnosis not present

## 2024-06-22 LAB — BASIC METABOLIC PANEL WITH GFR
Anion gap: 7 (ref 5–15)
BUN: 50 mg/dL — ABNORMAL HIGH (ref 8–23)
CO2: 33 mmol/L — ABNORMAL HIGH (ref 22–32)
Calcium: 9.2 mg/dL (ref 8.9–10.3)
Chloride: 91 mmol/L — ABNORMAL LOW (ref 98–111)
Creatinine, Ser: 0.69 mg/dL (ref 0.44–1.00)
GFR, Estimated: >60 mL/min (ref >60)
Glucose, Bld: 111 mg/dL — ABNORMAL HIGH (ref 70–99)
Potassium: 5.2 mmol/L — ABNORMAL HIGH (ref 3.5–5.1)
Sodium: 131 mmol/L — ABNORMAL LOW (ref 135–145)

## 2024-06-22 LAB — CBC
HCT: 28.7 % — ABNORMAL LOW (ref 36.0–46.0)
Hemoglobin: 9.3 g/dL — ABNORMAL LOW (ref 12.0–15.0)
MCH: 33.3 pg (ref 26.0–34.0)
MCHC: 32.4 g/dL (ref 30.0–36.0)
MCV: 102.9 fL — ABNORMAL HIGH (ref 80.0–100.0)
Platelets: 173 K/uL (ref 150–400)
RBC: 2.79 MIL/uL — ABNORMAL LOW (ref 3.87–5.11)
RDW: 14.1 % (ref 11.5–15.5)
WBC: 7 K/uL (ref 4.0–10.5)
nRBC: 0 % (ref 0.0–0.2)

## 2024-06-22 LAB — MAGNESIUM: Magnesium: 2.5 mg/dL — ABNORMAL HIGH (ref 1.7–2.4)

## 2024-06-22 LAB — CK: Total CK: 165 U/L (ref 38–234)

## 2024-06-22 MED ORDER — ENOXAPARIN SODIUM 40 MG/0.4ML IJ SOSY: 40.0000 mg | PREFILLED_SYRINGE | INTRAMUSCULAR | Status: DC

## 2024-06-22 NOTE — Plan of Care (Signed)
 The patient has been turned and repositioned every 2 hours. The patient has slept on and off during the night. No s/s of acute distress noted.

## 2024-06-22 NOTE — Plan of Care (Signed)
  Problem: Education: Goal: Individualized Educational Video(s) Outcome: Not Applicable   

## 2024-06-22 NOTE — NC FL2 (Signed)
 [place]  MEDICAID [id number] LEVEL OF CARE FORM     IDENTIFICATION  Patient Name: [name] Birthdate: 17-Jun-1925 Sex: female Admission Date (Current Location): 06/14/2024  [facility] [place] and [place] Number:  Chiropodist and Address:  [facility], [address], [place], [place] [address]      Provider Number: [id number]  Attending Physician Name and Address:  Fausto Burnard LABOR, DO  Relative Name and Phone Number:  Lamarr Candy, [phone]    Current Level of Care: Hospital Recommended Level of Care: Skilled Nursing Facility Prior Approval Number:    Date Approved/Denied:   PASRR Number: [id number]  Discharge Plan: SNF    Current Diagnoses: Patient Active Problem List   Diagnosis Date Noted   Hyponatremia 06/14/2024   Acute on chronic diastolic CHF (congestive heart failure) (HCC) 06/14/2024   HLD (hyperlipidemia) 06/14/2024   Chronic kidney disease, stage 3b (HCC) 06/14/2024   Impaired glucose regulation 12/05/2023   Acute bacterial bronchitis 12/05/2023   Annual physical exam 09/26/2022   Primary hypertension 03/27/2022   Paroxysmal atrial fibrillation (HCC) 02/05/2022   Acute on chronic combined systolic and diastolic CHF (congestive heart failure) (HCC) 02/05/2022   Pacemaker 2007    Orientation RESPIRATION BLADDER Height & Weight     Self, Time, Place, Situation  O2 (Nasal Canula 2.5 ML) Continent Weight: 127 lb 3.3 oz (57.7 kg) Height:  4' 11 (149.9 cm)  BEHAVIORAL SYMPTOMS/MOOD NEUROLOGICAL BOWEL NUTRITION STATUS      Continent Diet (Heart)  AMBULATORY STATUS COMMUNICATION OF NEEDS Skin   Extensive Assist Verbally Skin abrasions (Elbow Posterior; Right, Pretibial Left)                       Personal Care Assistance Level of Assistance  Feeding, Dressing, Bathing Bathing Assistance: Maximum assistance Feeding assistance: Limited assistance Dressing Assistance: Limited assistance     Functional Limitations Info   Sight, Hearing Sight Info: Impaired (Blind) Hearing Info: Impaired      SPECIAL CARE FACTORS FREQUENCY  PT (By licensed PT), OT (By licensed OT)     PT Frequency: 5x/week OT Frequency: 5x/week            Contractures      Additional Factors Info  Code Status, Allergies Code Status Info: DNR Limited Allergies Info: Moxifloxacin, Tobramycin, Codeine           Current Medications (06/22/2024):  This is the current hospital active medication list Current Facility-Administered Medications  Medication Dose Route Frequency Provider Last Rate Last Admin   acetaminophen  (TYLENOL ) tablet 650 mg  650 mg Oral Q6H PRN Niu, Xilin, MD   650 mg at 06/21/24 2004   albuterol  (PROVENTIL ) (2.5 MG/3ML) 0.083% nebulizer solution 2.5 mg  2.5 mg Inhalation Q4H PRN Niu, Xilin, MD       artificial tears ophthalmic solution 1 drop  1 drop Both Eyes PRN Fausto Burnard A, DO       atorvastatin  (LIPITOR) tablet 10 mg  10 mg Oral Daily Niu, Xilin, MD   10 mg at 06/22/24 1005   dextromethorphan-guaiFENesin  (MUCINEX  DM) 30-600 MG per 12 hr tablet 1 tablet  1 tablet Oral BID PRN Niu, Xilin, MD       docusate sodium  (COLACE) capsule 100 mg  100 mg Oral BID Fausto Burnard A, DO   100 mg at 06/21/24 2005   feeding supplement (ENSURE PLUS HIGH PROTEIN) liquid 237 mL  237 mL Oral BID BM Breeze, Faith, NP   237 mL  at 06/21/24 9175   hydrALAZINE  (APRESOLINE ) injection 5 mg  5 mg Intravenous Q2H PRN Niu, Xilin, MD       methocarbamol  (ROBAXIN ) tablet 500 mg  500 mg Oral Once PRN Fausto Sor A, DO       metoprolol  tartrate (LOPRESSOR ) tablet 25 mg  25 mg Oral BID Fausto Sor A, DO   25 mg at 06/22/24 1005   midodrine  (PROAMATINE ) tablet 5 mg  5 mg Oral TID WC Fausto Sor A, DO   5 mg at 06/22/24 1325   Muscle Rub CREA 1 Application  1 Application Topical PRN Fausto Sor A, DO   1 Application at 06/21/24 0040   ondansetron  (ZOFRAN ) injection 4 mg  4 mg Intravenous Q8H PRN Niu, Xilin, MD   4 mg  at 06/17/24 9048   pantoprazole  (PROTONIX ) EC tablet 40 mg  40 mg Oral BID Fausto Sor A, DO   40 mg at 06/22/24 1005     Discharge Medications: Please see discharge summary for a list of discharge medications.  Relevant Imaging Results:  Relevant Lab Results:   Additional Information SSN: [id number]  Takuya Lariccia  Vicci, LCSW

## 2024-06-22 NOTE — Progress Notes (Signed)
 Progress Note   Patient: [name] FMW:[id number] DOB: 06-Mar-1925 DOA: 06/14/2024     8 DOS: the patient was seen and examined on 06/22/2024   Brief hospital course: 89 y.o. female with medical history significant of dCHF, HTN, HLD, PMM due to sinus pause 2007, GERD, CKD-CVA, A-fib, blindness, HOH, who presents with SOB and abnormal labs with hyponatremia, initial sodium level 114.  Patient was admitted with Nephrology consulted for further evaluation and management. Hypertonic saline was started initially with improvement in sodium level up to 121 >> 125. To prevent overcorrection, 3% saline was stopped.  Patient is also being treated with IV Lasix  for hypervolemia.  CT chest was negative for any lung masses.  Palliative care was consulted for goals of care discussions.  Patient and family are considering palliative vs hospice care upon discharge.  Further hospital course and management as outlined below.    Assessment and Plan:  Hyponatremia:  Patient has hypervolemic hyponatremia. Her sodium is 117 in ED. s/p 3% hypertonic saline and IV Lasix .  Initially treated with hypertonic saline. --Nephrology following --Off 3% saline --Sodium improved 114 >> 124 (3% stopped) >> 125 >> 124 >> 127 >> 125 >> 126 (gentle fluids started) >> 127 >> 128 >> 132 >> 131 --Monitoring off diuretics and fluids  --Will defer resuming of diuretics to nephrology --CT chest negative for lung masses --Serial BMP's to monitor   Acute on chronic diastolic CHF (congestive heart failure) (HCC): 2D echo on 06/03/2023 showed EF 55 to 60%.  Patient has SOB, elevated BNP 770, positive JVD and increased interstitial opacity on chest x-ray, clinically consistent with CHF exacerbation. Bilateral pleural effusions - noted on CT chest --Holding diuresis as above --Daily weights, strict I/O's --Low sodium diet & fluid restriction  AKI superimposed on CKD stage 3a [facility]): Recent baseline creatinine 1.10.   Cr has  increased in setting of diuresis, 1.09 >> 1.14 >> 1.34 >> 1.42 (gentle fluids started) >> 1.34 >> 1.10 >> 0.69 >> 0.69 - Holding diuresis - Hold Cozaar , metolazone  and torsemide  as above - Nephrology following - Started on midodrine  for soft BP's - Ensure adequate renal perfusion  Right thigh pain due to acute intramuscular hematoma -- acute onset right anterior thigh pain and swelling on evening 7/12. See soft tissue U/S report from 7/14.   DVT U/S negative.  Right femur and pelvis x-rays negative.   No injury or trauma. On exam, mild tenderness on mid-proximal anterior right thigh. Hbg is down 2 points from last CBC 11.3 >> 9.3. Mild elevation in CK as well, but already resolved --Tylenol  and/or Robaxin  PRN --Hold Lovenox  injections --Monitor for increasing pain or swelling --Monitor Hbg  Acute respiratory failure with hypoxia - due to pleural effusions/CHF and underlying chronic lung disease and bronchiectasis (as seen on CT chest).  No prior home oxygen use.  Pt has been on 2-3 L/min here and endorses shortness of breath. --Supplement O2 to keep sats > 90% --Home O2 qualification today - pt meets criteria, will order home O2 upon d/c  Bronchiectasis, Chronic interstitial lung disease -- as per CT chest findings --PRN bronchodilators --PRN Mucinex  --O2 as above    Primary hypertension: pt with soft and low BP's here -Hold amlodipine , Cozaar , metolazone  and torsemide  due to softer blood pressure. -Metoprolol  dose and changed to tartrate for crushing in applesauce -Started midodrine  5 mg TID due to low BP's - IV hydralazine  as needed   HLD (hyperlipidemia) -Lipitor   Paroxysmal atrial fibrillation [facility]): Patient is  not taking anticoagulants currently due to history of macular degeneration and fall risk . Heart rate 60-80s -lowered dose of metoprolol  from 200 >> 50 mg daily, hold if MAP< 65    Goals of care Per Dr. Maree: As per daughter/Wanda/POA, patient has been active  until about a month ago.  She was attending daycare center and walking about 30 minutes/day but for about a month now she is not doing much and staying at home.  She is open and interested in exploring possible hospice versus palliative approach to keep her comfortable at home.  Daughter shared that patient is blind and has hearing loss making it very difficult to manage at times --Palliative care and [facility] hospice liaison are following      Subjective: Pt seen with daughter at bedside on rounds.  Pt's hearing aids were not charged overnight again.  Daughter feeding pt breakfast.  We discussed the U/S findings and suspecting a hematoma, given onset was so acute.  Discussed contrast-enhanced CT would be needed to get a more detailed look to come to diagnosis, but would unlikely change our management.  Daughter wanted to discuss with her siblings before deciding. Pt without acute complaints.   Physical Exam: Vitals:   06/22/24 0432 06/22/24 0500 06/22/24 0851 06/22/24 0852  BP: 116/67  (!) 136/54 (!) 136/54  Pulse: 66  67 64  Resp: 18  16 16   Temp: 97.8 F (36.6 C)   98.1 F (36.7 C)  TempSrc:      SpO2: 93%  96% 97%  Weight:  57.7 kg    Height:       General exam: awake laying in bed, more talkative today, no acute distress HEENT: moist mucus membranes, very hard of hearing Respiratory system: lungs clear diminished bases, normal respiratory effort at rest on 2.5 L/min [place] O2 Cardiovascular system: normal S1/S2, RRR, no pedal edema.   Gastrointestinal system: soft, NT, ND Central nervous system: A&O x 2+. no gross focal neurologic deficits, normal speech Extremities: right anterior thigh without visible swelling or ecchymosis, no erythema or warmth, but area of mild tenderness on palpation Skin: dry, intact, normal temperature Psychiatry: normal mood, congruent affect   Data Reviewed:  Notable labs -- Na 128 >> 132 >> 131, Cl 91, bicarb 33, glucose 111, BUN 50, Mg 2.5, Hbg  11.3 >> 9.3 (supports R thigh mass being a hematoma)   CK 277 >> 165 normalized  Soft tissue U/S of right thigh 7/14 -- shows a 9x3.4.3 cm complex lobulated mass in the muscle body -- acute intramusclular hematoma vs a hypovascular solid mass.  RLE U/S negative for DVT X-rays of right femur and pelvis were negative.   Family Communication: daughter at bedside on rounds daily   Disposition: Status is: Inpatient Remains inpatient appropriate because: closely monitoring labs for stable sodium and renal function, new onset right thigh pain under evaluation and closely monitoring     Planned Discharge Destination: Home with Home Health    Time spent: 38 minutes  Author: Burnard DELENA Cunning, DO 06/22/2024 1:50 PM  For on call review [email or web address].

## 2024-06-22 NOTE — TOC Progression Note (Signed)
 Transition of Care [facility]) - Progression Note    Patient Details  Name: [name] MRN: [id number] Date of Birth: 1925-10-08  [facility] CM/SW Contact  Alvaro Louder, [place] Phone Number: 06/22/2024, 3:45 PM  Clinical Narrative:   LCSWA completed SNF work-up and faxed out information to facilities in the [place] area.   TOC to follow for discharge.       Barriers to Discharge: Continued Medical Work up  Expected Discharge Plan and Services In-house Referral: Clinical Social Work     Living arrangements for the past 2 months: Single Family Home                                       Social Determinants of Health (SDOH) Interventions SDOH Screenings   Food Insecurity: No Food Insecurity (06/15/2024)  Housing: Low Risk  (06/15/2024)  Transportation Needs: No Transportation Needs (06/15/2024)  Utilities: Not At Risk (06/15/2024)  Alcohol  Screen: Low Risk  (07/08/2023)  Depression (PHQ2-9): Medium Risk (04/28/2024)  Financial Resource Strain: Low Risk  (11/24/2023)  Physical Activity: Sufficiently Active (11/24/2023)  Social Connections: Patient Unable To Answer (06/15/2024)  Stress: No Stress Concern Present (11/24/2023)  Tobacco Use: Low Risk  (06/15/2024)  Health Literacy: Adequate Health Literacy (07/08/2023)    Readmission Risk Interventions     No data to display

## 2024-06-22 NOTE — TOC Progression Note (Addendum)
 Transition of Care [facility]) - Progression Note    Patient Details  Name: [name] MRN: [id number] Date of Birth: August 06, 1925  Transition of Care [facility]) CM/SW Contact  Dalia GORMAN Fuse, RN Phone Number: 06/22/2024, 2:10 PM  Clinical Narrative:     Closely monitoring labs for stable sodium and renal function, new onset right thigh pain under evaluation. Plan for to resume  [facility] PT/OT  with [facility] when medically ready. DME ordered from [employer].   TOC received a message from the MD advising the patient's daughter is requesting SNF. TOC outreached to patient's daughter and explained that PT is currently recommending home with [facility], insurance typically wont pay for STR admissions for OT.   Awaiting updated PT notes to determine if recommendations have changed.     Barriers to Discharge: Continued Medical Work up  Expected Discharge Plan and Services In-house Referral: Clinical Social Work     Living arrangements for the past 2 months: Single Family Home                                       Social Determinants of Health (SDOH) Interventions SDOH Screenings   Food Insecurity: No Food Insecurity (06/15/2024)  Housing: Low Risk  (06/15/2024)  Transportation Needs: No Transportation Needs (06/15/2024)  Utilities: Not At Risk (06/15/2024)  Alcohol  Screen: Low Risk  (07/08/2023)  Depression (PHQ2-9): Medium Risk (04/28/2024)  Financial Resource Strain: Low Risk  (11/24/2023)  Physical Activity: Sufficiently Active (11/24/2023)  Social Connections: Patient Unable To Answer (06/15/2024)  Stress: No Stress Concern Present (11/24/2023)  Tobacco Use: Low Risk  (06/15/2024)  Health Literacy: Adequate Health Literacy (07/08/2023)    Readmission Risk Interventions     No data to display

## 2024-06-23 ENCOUNTER — Telehealth: Payer: Self-pay | Admitting: Family Medicine

## 2024-06-23 DIAGNOSIS — E871 Hypo-osmolality and hyponatremia: Secondary | ICD-10-CM | POA: Diagnosis not present

## 2024-06-23 LAB — BASIC METABOLIC PANEL WITH GFR
Anion gap: 7 (ref 5–15)
Anion gap: 9 (ref 5–15)
BUN: 53 mg/dL — ABNORMAL HIGH (ref 8–23)
BUN: 51 mg/dL — ABNORMAL HIGH (ref 8–23)
CO2: 32 mmol/L (ref 22–32)
CO2: 31 mmol/L (ref 22–32)
Calcium: 9 mg/dL (ref 8.9–10.3)
Calcium: 8.8 mg/dL — ABNORMAL LOW (ref 8.9–10.3)
Chloride: 93 mmol/L — ABNORMAL LOW (ref 98–111)
Chloride: 91 mmol/L — ABNORMAL LOW (ref 98–111)
Creatinine, Ser: 0.79 mg/dL (ref 0.44–1.00)
Creatinine, Ser: 0.82 mg/dL (ref 0.44–1.00)
GFR, Estimated: >60 mL/min (ref >60)
GFR, Estimated: >60 mL/min (ref >60)
Glucose, Bld: 99 mg/dL (ref 70–99)
Glucose, Bld: 143 mg/dL — ABNORMAL HIGH (ref 70–99)
Potassium: 5.5 mmol/L — ABNORMAL HIGH (ref 3.5–5.1)
Potassium: 5.5 mmol/L — ABNORMAL HIGH (ref 3.5–5.1)
Sodium: 132 mmol/L — ABNORMAL LOW (ref 135–145)
Sodium: 131 mmol/L — ABNORMAL LOW (ref 135–145)

## 2024-06-23 LAB — FOLATE: Folate: 28 ng/mL (ref >5.9)

## 2024-06-23 LAB — CBC
HCT: 28.5 % — ABNORMAL LOW (ref 36.0–46.0)
Hemoglobin: 9 g/dL — ABNORMAL LOW (ref 12.0–15.0)
MCH: 32.6 pg (ref 26.0–34.0)
MCHC: 31.6 g/dL (ref 30.0–36.0)
MCV: 103.3 fL — ABNORMAL HIGH (ref 80.0–100.0)
Platelets: 174 K/uL (ref 150–400)
RBC: 2.76 MIL/uL — ABNORMAL LOW (ref 3.87–5.11)
RDW: 14.1 % (ref 11.5–15.5)
WBC: 6.1 K/uL (ref 4.0–10.5)
nRBC: 0 % (ref 0.0–0.2)

## 2024-06-23 LAB — VITAMIN D 25 HYDROXY (VIT D DEFICIENCY, FRACTURES): Vit D, 25-Hydroxy: 63.3 ng/mL (ref 30–100)

## 2024-06-23 LAB — VITAMIN B12: Vitamin B-12: 743 pg/mL (ref 180–914)

## 2024-06-23 MED ORDER — SODIUM ZIRCONIUM CYCLOSILICATE 10 G PO PACK
10.0000 g | PACK | Freq: Once | ORAL | Status: AC
  Administered 2024-06-23: 10 g via ORAL
  Filled 2024-06-23: qty 1

## 2024-06-23 NOTE — Progress Notes (Addendum)
 Mobility Specialist - Progress Note   06/23/24 1500  Mobility  Activity Transferred from bed to chair  Level of Assistance Maximum assist, patient does 25-49%  Assistive Device Front wheel walker  Mobility visit 1 Mobility     Pt sleeping in chair on arrival, utilizing 2.5L. STS and transfer with modA +2. VC for sequencing. Pain in BLE (B ankles and R hip/thigh) with position change. Pt left in bed with alarm set, needs in reach. Family at bedside.    Lennette Seip Mobility Specialist 06/23/24, 4:22 PM

## 2024-06-23 NOTE — Plan of Care (Signed)
  Problem: Education: Goal: Knowledge of General Education information will improve Description Including pain rating scale, medication(s)/side effects and non-pharmacologic comfort measures Outcome: Progressing   Problem: Education: Goal: Ability to demonstrate management of disease process will improve Outcome: Progressing Goal: Ability to verbalize understanding of medication therapies will improve Outcome: Progressing   Problem: Activity: Goal: Capacity to carry out activities will improve Outcome: Progressing   Problem: Cardiac: Goal: Ability to achieve and maintain adequate cardiopulmonary perfusion will improve Outcome: Progressing   

## 2024-06-23 NOTE — Progress Notes (Signed)
 Physical Therapy Treatment Patient Details Name: [name] MRN: [id number] DOB: October 27, 1925 Today's Date: 06/23/2024   History of Present Illness [name] is a 89yoF who comes to [facility] on 06/14/24  c SOB. PMH: CHF, HTN, HLD, CKD, CVA, AF, blindness, HOH. DTR reports recent LEE as well. In ED pt with intermittent confusion, fatigue, imbalance- workup revealing of Na+ 114, BP 84/23mmHg.    PT Comments  Patient received in bed, she is resting. Very HOH & Blind. Granddaughter present in room. Patient requires max/total assist for all mobility. She was able to pivot to recliner with +2 max A. Pain limited in B LEs, Patient will continue to benefit from skilled PT to improve independence and safety with mobility.       If plan is discharge home, recommend the following: A lot of help with walking and/or transfers;A lot of help with bathing/dressing/bathroom   Can travel by private vehicle     No  Equipment Recommendations  Other (comment) (TBD)    Recommendations for Other Services       Precautions / Restrictions Precautions Precautions: Fall Recall of Precautions/Restrictions: Intact Precaution/Restrictions Comments: BLIND Restrictions Weight Bearing Restrictions Per Provider Order: No     Mobility  Bed Mobility Overal bed mobility: Needs Assistance Bed Mobility: Supine to Sit Rolling: Max assist, +2 for physical assistance, Used rails   Supine to sit: Max assist, +2 for physical assistance, Used rails, HOB elevated          Transfers Overall transfer level: Needs assistance Equipment used: 2 person hand held assist Transfers: Sit to/from Stand, Bed to chair/wheelchair/BSC Sit to Stand: Mod assist, +2 physical assistance, Max assist Stand pivot transfers: Mod assist, +2 physical assistance, Max assist         General transfer comment: pivoted from bed to recliner    Ambulation/Gait                   Stairs             Wheelchair Mobility      Tilt Bed    Modified Rankin (Stroke Patients Only)       Balance Overall balance assessment: Needs assistance Sitting-balance support: Feet supported Sitting balance-Leahy Scale: Fair     Standing balance support: Bilateral upper extremity supported, During functional activity Standing balance-Leahy Scale: Fair                              Hotel manager: Impaired Factors Affecting Communication: Hearing impaired  Cognition Arousal: Alert Behavior During Therapy: WFL for tasks assessed/performed   PT - Cognitive impairments: No apparent impairments                         Following commands: Intact      Cueing Cueing Techniques: Verbal cues, Tactile cues  Exercises      General Comments        Pertinent Vitals/Pain Pain Assessment Pain Assessment: PAINAD Faces Pain Scale: Hurts even more Breathing: normal Negative Vocalization: occasional moan/groan, low speech, negative/disapproving quality Facial Expression: facial grimacing Body Language: tense, distressed pacing, fidgeting Consolability: distracted or reassured by voice/touch PAINAD Score: 5 Pain Location: RLE pain with movement/mobility B LEs Pain Descriptors / Indicators: Discomfort, Grimacing, Guarding, Sore, Tender Pain Intervention(s): Monitored during session, Repositioned    Home Living  Prior Function            PT Goals (current goals can now be found in the care plan section) Acute Rehab PT Goals Patient Stated Goal: get back to walkin' daily PT Goal Formulation: With patient Time For Goal Achievement: 06/30/24 Potential to Achieve Goals: Fair Progress towards PT goals: Progressing toward goals    Frequency    Min 2X/week      PT Plan      Co-evaluation PT/OT/SLP Co-Evaluation/Treatment: Yes Reason for Co-Treatment: To address functional/ADL transfers;Necessary to address  cognition/behavior during functional activity;For patient/therapist safety PT goals addressed during session: Mobility/safety with mobility;Balance        AM-PAC PT 6 Clicks Mobility   Outcome Measure  Help needed turning from your back to your side while in a flat bed without using bedrails?: A Lot Help needed moving from lying on your back to sitting on the side of a flat bed without using bedrails?: A Lot Help needed moving to and from a bed to a chair (including a wheelchair)?: A Lot Help needed standing up from a chair using your arms (e.g., wheelchair or bedside chair)?: A Lot Help needed to walk in hospital room?: Total Help needed climbing 3-5 steps with a railing? : Total 6 Click Score: 10    End of Session Equipment Utilized During Treatment: Oxygen Activity Tolerance: Patient limited by pain;Patient limited by fatigue Patient left: in chair;with call bell/phone within reach;with chair alarm set;with family/visitor present Nurse Communication: Mobility status PT Visit Diagnosis: Unsteadiness on feet (R26.81);Repeated falls (R29.6);Other abnormalities of gait and mobility (R26.89);Muscle weakness (generalized) (M62.81);Difficulty in walking, not elsewhere classified (R26.2);Pain Pain - Right/Left:  (B) Pain - part of body: Leg     Time: 1130-1146 PT Time Calculation (min) (ACUTE ONLY): 16 min  Charges:    $Therapeutic Activity: 8-22 mins PT General Charges $$ ACUTE PT VISIT: 1 Visit                     Gale Klar, PT, GCS 06/23/24,12:01 PM

## 2024-06-23 NOTE — Telephone Encounter (Signed)
 Called pharmacy to confirm that the patient is now taking 25 mg, rather than 50 mg.  The 25 mg was filled on 06/14/2024.  Pharmacist stated that they will discontinue the other refills considering her decrease in dosage.

## 2024-06-23 NOTE — Telephone Encounter (Signed)
 2nd request from [facility] for refills on Losartan  Potassium 50 mg. #180

## 2024-06-23 NOTE — Progress Notes (Signed)
 Occupational Therapy Treatment Patient Details Name: [name] MRN: [id number] DOB: 09/07/1925 Today's Date: 06/23/2024   History of present illness [name] is a 89yoF who comes to [facility] on 06/14/24  c SOB. PMH: CHF, HTN, HLD, CKD, CVA, AF, blindness, HOH. DTR reports recent LEE as well. In ED pt with intermittent confusion, fatigue, imbalance- workup revealing of Na+ 114, BP 84/49mmHg.   OT comments  Chart reviewed to date, pt greeted in bed, agreeable to OT tx session. Grand daughter present throughout. Tx session targeted improving functional activity tolerance in prep for ADL tasks. MAX A +2 required for bed mobility, STS with MOD-MAX A +2, SPT to bedside chair with MOD-MAX A +2 via HHA. Supervision for brushing teeth in chair. Pt is making progress towards goals however continues to perform ADL/functional mobility below PLOF, will continue to benefit from acute OT to address deficits and facilitate optimal ADL performance.       If plan is discharge home, recommend the following:  A little help with walking and/or transfers;A little help with bathing/dressing/bathroom;Help with stairs or ramp for entrance   Equipment Recommendations  BSC/3in1    Recommendations for Other Services      Precautions / Restrictions Precautions Precautions: Fall Recall of Precautions/Restrictions: Impaired Precaution/Restrictions Comments: blind Restrictions Weight Bearing Restrictions Per Provider Order: No       Mobility Bed Mobility Overal bed mobility: Needs Assistance   Rolling: Max assist, +2 for physical assistance, Used rails   Supine to sit: Max assist, +2 for physical assistance, Used rails, HOB elevated Sit to supine: Max assist, +2 for physical assistance        Transfers Overall transfer level: Needs assistance Equipment used: 2 person hand held assist Transfers: Sit to/from Stand Sit to Stand: Mod assist, +2 physical assistance, Max assist Stand pivot transfers: Mod  assist, +2 physical assistance, Max assist               Balance Overall balance assessment: Needs assistance Sitting-balance support: Feet supported Sitting balance-Leahy Scale: Fair     Standing balance support: Bilateral upper extremity supported, During functional activity Standing balance-Leahy Scale: Fair                             ADL either performed or assessed with clinical judgement   ADL Overall ADL's : Needs assistance/impaired     Grooming: Supervision/safety;Sitting;Oral care           Upper Body Dressing : Minimal assistance Upper Body Dressing Details (indicate cue type and reason): donn/doff gown Lower Body Dressing: Maximal assistance Lower Body Dressing Details (indicate cue type and reason): socks Toilet Transfer: +2 for physical assistance;Maximal assistance                  Extremity/Trunk Assessment              Vision       Perception     Praxis     Communication Communication Communication: Impaired Factors Affecting Communication: Hearing impaired   Cognition Arousal: Alert Behavior During Therapy: WFL for tasks assessed/performed Cognition: No apparent impairments                               Following commands: Intact        Cueing   Cueing Techniques: Verbal cues, Tactile cues  Exercises Other Exercises Other Exercises: edu pt/grand daugther re:  role of OT, role of rehab, discharge recommendation    Shoulder Instructions       General Comments vss; continues to report R thigh discomfort    Pertinent Vitals/ Pain       Pain Assessment Pain Assessment: PAINAD Breathing: normal Negative Vocalization: occasional moan/groan, low speech, negative/disapproving quality Facial Expression: facial grimacing Body Language: tense, distressed pacing, fidgeting Consolability: distracted or reassured by voice/touch PAINAD Score: 5 Pain Location: RLE pain with movement/mobility B  LEs Pain Descriptors / Indicators: Discomfort, Grimacing, Guarding, Sore, Tender Pain Intervention(s): Monitored during session, Repositioned  Home Living                                          Prior Functioning/Environment              Frequency  Min 2X/week        Progress Toward Goals  OT Goals(current goals can now be found in the care plan section)  Progress towards OT goals: Progressing toward goals  Acute Rehab OT Goals Time For Goal Achievement: 06/30/24  Plan      Co-evaluation    PT/OT/SLP Co-Evaluation/Treatment: Yes Reason for Co-Treatment: To address functional/ADL transfers;Necessary to address cognition/behavior during functional activity;For patient/therapist safety PT goals addressed during session: Mobility/safety with mobility;Balance OT goals addressed during session: ADL's and self-care;Proper use of Adaptive equipment and DME      AM-PAC OT 6 Clicks Daily Activity     Outcome Measure   Help from another person eating meals?: A Little Help from another person taking care of personal grooming?: A Little Help from another person toileting, which includes using toliet, bedpan, or urinal?: A Little Help from another person bathing (including washing, rinsing, drying)?: A Lot Help from another person to put on and taking off regular upper body clothing?: A Little Help from another person to put on and taking off regular lower body clothing?: A Lot 6 Click Score: 16    End of Session Equipment Utilized During Treatment: Gait belt;Oxygen  OT Visit Diagnosis: Unsteadiness on feet (R26.81);Muscle weakness (generalized) (M62.81);History of falling (Z91.81)   Activity Tolerance Patient tolerated treatment well   Patient Left with family/visitor present;in chair;with chair alarm set;with call bell/phone within reach   Nurse Communication Mobility status        Time: 8873-8845 OT Time Calculation (min): 28 min  Charges:  OT General Charges $OT Visit: 1 Visit OT Treatments $Self Care/Home Management : 8-22 mins  Therisa Sheffield, OTD OTR/L  06/23/24, 12:52 PM

## 2024-06-23 NOTE — Progress Notes (Signed)
 Progress Note   Patient: [name] FMW:[id number] DOB: 1925/01/12 DOA: 06/14/2024     9 DOS: the patient was seen and examined on 06/23/2024   Brief hospital course: 89 y.o. female with medical history significant of dCHF, HTN, HLD, PMM due to sinus pause 2007, GERD, CKD-CVA, A-fib, blindness, HOH, who presents with SOB and abnormal labs with hyponatremia, initial sodium level 114.  Patient was admitted with Nephrology consulted for further evaluation and management. Hypertonic saline was started initially with improvement in sodium level up to 121 >> 125. To prevent overcorrection, 3% saline was stopped.  Patient is also being treated with IV Lasix  for hypervolemia.  CT chest was negative for any lung masses.  Palliative care was consulted for goals of care discussions.  Patient and family are considering palliative vs hospice care upon discharge.  Further hospital course and management as outlined below.    Assessment and Plan:  Hyponatremia:  Patient has hypervolemic hyponatremia. Her sodium is 117 in ED. s/p 3% hypertonic saline and IV Lasix .  Initially treated with hypertonic saline. --Nephrology following --Off 3% saline --Sodium improved 114 >> 124 (3% stopped) and s/p gentle fluids started, Na >> 131  --Monitoring off diuretics and fluids  --Will defer resuming of diuretics to nephrology --CT chest negative for lung masses --Serial BMP's to monitor  Hyperkalemia, Lokelma  10 g 1 dose given on 7/60 Monitor BMP daily   Acute on chronic diastolic CHF (congestive heart failure) (HCC): 2D echo on 06/03/2023 showed EF 55 to 60%.  Patient has SOB, elevated BNP 770, positive JVD and increased interstitial opacity on chest x-ray, clinically consistent with CHF exacerbation. Bilateral pleural effusions - noted on CT chest --Holding diuresis as above --Daily weights, strict I/O's --Low sodium diet & fluid restriction  AKI superimposed on CKD stage 3a [facility]): Recent baseline  creatinine 1.10.   Cr has increased in setting of diuresis, 1.09 >> 1.14 >> 1.34 >> 1.42 (gentle fluids started) >> 1.34 >> 1.10 >> 0.69 >> 0.69 - Holding diuresis - Hold Cozaar , metolazone  and torsemide  as above - Nephrology following - Started on midodrine  for soft BP's - Ensure adequate renal perfusion  Right thigh pain due to acute intramuscular hematoma -- acute onset right anterior thigh pain and swelling on evening 7/12. See soft tissue U/S report from 7/14.   DVT U/S negative.  Right femur and pelvis x-rays negative.   No injury or trauma. On exam, mild tenderness on mid-proximal anterior right thigh. Hbg is down 2 points from last CBC 11.3 >> 9.3. Mild elevation in CK as well, but already resolved --Tylenol  and/or Robaxin  PRN --Hold Lovenox  injections --Monitor for increasing pain or swelling --Monitor Hbg  Acute respiratory failure with hypoxia - due to pleural effusions/CHF and underlying chronic lung disease and bronchiectasis (as seen on CT chest).  No prior home oxygen use.  Pt has been on 2-3 L/min here and endorses shortness of breath. --Supplement O2 to keep sats > 90% --Home O2 qualification today - pt meets criteria, will order home O2 upon d/c  Bronchiectasis, Chronic interstitial lung disease -- as per CT chest findings --PRN bronchodilators --PRN Mucinex  --O2 as above    Primary hypertension: pt with soft and low BP's here -Hold amlodipine , Cozaar , metolazone  and torsemide  due to softer blood pressure. -Metoprolol  dose and changed to tartrate for crushing in applesauce -Started midodrine  5 mg TID due to low BP's - IV hydralazine  as needed   HLD (hyperlipidemia) -Lipitor   Paroxysmal atrial fibrillation [facility]): Patient  is not taking anticoagulants currently due to history of macular degeneration and fall risk . Heart rate 60-80s -lowered dose of metoprolol  from 200 >> 50 mg daily, hold if MAP< 65    Goals of care Per Dr. Maree: As per daughter/Wanda/POA,  patient has been active until about a month ago.  She was attending daycare center and walking about 30 minutes/day but for about a month now she is not doing much and staying at home.  She is open and interested in exploring possible hospice versus palliative approach to keep her comfortable at home.  Daughter shared that patient is blind and has hearing loss making it very difficult to manage at times --Palliative care and [facility] hospice liaison are following      Subjective: No significant events overnight. Patient granddaughter was at bedside.  Patient is off-and-on confused and having some visual hallucinations as she is blind cannot see.  Patient is sitting blackboard and some writing on the black board. Patient denied any other complaints.    Physical Exam: Vitals:   06/23/24 0411 06/23/24 0456 06/23/24 0918 06/23/24 1625  BP:  116/62 (!) 109/59 (!) 138/48  Pulse:  80 61 (!) 59  Resp:  17 16 20   Temp:  (!) 97.5 F (36.4 C) 97.6 F (36.4 C) (!) 97.5 F (36.4 C)  TempSrc:   Oral   SpO2:  98% 99% 100%  Weight: 55.2 kg     Height:       General exam: awake laying in bed, more talkative today, no acute distress HEENT: moist mucus membranes, very hard of hearing Respiratory system: lungs clear diminished bases, normal respiratory effort at rest on 2.5 L/min [place] O2 Cardiovascular system: normal S1/S2, RRR, no pedal edema.   Gastrointestinal system: soft, NT, ND Central nervous system: A&O x 2+. no gross focal neurologic deficits, normal speech Extremities: right anterior thigh without visible swelling or ecchymosis, no erythema or warmth, but area of mild tenderness on palpation Skin: dry, intact, normal temperature Psychiatry: normal mood, congruent affect   Data Reviewed:  Notable labs -- Na 128 >> 132 >> 131, Cl 91, bicarb 33, glucose 111, BUN 50, Mg 2.5, Hbg 11.3 >> 9.3 (supports R thigh mass being a hematoma)   CK 277 >> 165 normalized  Soft tissue U/S of right  thigh 7/14 -- shows a 9x3.4.3 cm complex lobulated mass in the muscle body -- acute intramusclular hematoma vs a hypovascular solid mass.  RLE U/S negative for DVT X-rays of right femur and pelvis were negative.   Family Communication: daughter at bedside on rounds daily   Disposition: Status is: Inpatient Remains inpatient appropriate because: closely monitoring labs for stable sodium and renal function, new onset right thigh pain under evaluation and closely monitoring     Planned Discharge Destination: Home with Home Health    Time spent: 38 minutes  Author: Elvan Sor, MD 06/23/2024 6:27 PM  For on call review [email or web address].

## 2024-06-24 ENCOUNTER — Encounter: Admitting: Family

## 2024-06-24 DIAGNOSIS — E871 Hypo-osmolality and hyponatremia: Secondary | ICD-10-CM | POA: Diagnosis not present

## 2024-06-24 LAB — MAGNESIUM: Magnesium: 2.5 mg/dL — ABNORMAL HIGH (ref 1.7–2.4)

## 2024-06-24 LAB — BASIC METABOLIC PANEL WITH GFR
Anion gap: 6 (ref 5–15)
BUN: 56 mg/dL — ABNORMAL HIGH (ref 8–23)
CO2: 31 mmol/L (ref 22–32)
Calcium: 8.8 mg/dL — ABNORMAL LOW (ref 8.9–10.3)
Chloride: 90 mmol/L — ABNORMAL LOW (ref 98–111)
Creatinine, Ser: 0.92 mg/dL (ref 0.44–1.00)
GFR, Estimated: 57 mL/min — ABNORMAL LOW (ref >60)
Glucose, Bld: 103 mg/dL — ABNORMAL HIGH (ref 70–99)
Potassium: 5.7 mmol/L — ABNORMAL HIGH (ref 3.5–5.1)
Sodium: 127 mmol/L — ABNORMAL LOW (ref 135–145)

## 2024-06-24 LAB — CBC
HCT: 27.9 % — ABNORMAL LOW (ref 36.0–46.0)
Hemoglobin: 8.8 g/dL — ABNORMAL LOW (ref 12.0–15.0)
MCH: 32.8 pg (ref 26.0–34.0)
MCHC: 31.5 g/dL (ref 30.0–36.0)
MCV: 104.1 fL — ABNORMAL HIGH (ref 80.0–100.0)
Platelets: 178 K/uL (ref 150–400)
RBC: 2.68 MIL/uL — ABNORMAL LOW (ref 3.87–5.11)
RDW: 13.8 % (ref 11.5–15.5)
WBC: 6.1 K/uL (ref 4.0–10.5)
nRBC: 0 % (ref 0.0–0.2)

## 2024-06-24 LAB — PHOSPHORUS: Phosphorus: 3.6 mg/dL (ref 2.5–4.6)

## 2024-06-24 LAB — OSMOLALITY: Osmolality: 287 mosm/kg (ref 275–295)

## 2024-06-24 MED ORDER — METOPROLOL TARTRATE 25 MG PO TABS
12.5000 mg | ORAL_TABLET | Freq: Two times a day (BID) | ORAL | Status: DC
  Administered 2024-06-24: 12.5 mg via ORAL
  Filled 2024-06-24 (×2): qty 1

## 2024-06-24 MED ORDER — SODIUM CHLORIDE 1 G PO TABS
2.0000 g | ORAL_TABLET | Freq: Two times a day (BID) | ORAL | Status: DC
  Administered 2024-06-24 – 2024-06-26 (×5): 2 g via ORAL
  Filled 2024-06-24 (×5): qty 2

## 2024-06-24 MED ORDER — SODIUM ZIRCONIUM CYCLOSILICATE 10 G PO PACK
10.0000 g | PACK | Freq: Three times a day (TID) | ORAL | Status: AC
  Administered 2024-06-24 (×2): 10 g via ORAL
  Filled 2024-06-24 (×2): qty 1

## 2024-06-24 NOTE — TOC Progression Note (Signed)
 Transition of Care [facility]) - Progression Note    Patient Details  Name: Labria Wos MRN: [id number] Date of Birth: [birth year]  [facility] CM/SW Contact  Dalia GORMAN Fuse, RN Phone Number: 06/24/2024, 1:43 PM  Clinical Narrative:    Shara received for [facility] [phone] Dates: 7/17-7/21/2025 Next Review Date: 06/28/2024  [facility] sent secure message to MD to notify of auth.     Barriers to Discharge: Continued Medical Work up  Expected Discharge Plan and Services In-house Referral: Clinical Social Work     Living arrangements for the past 2 months: Single Family Home                                       Social Determinants of Health (SDOH) Interventions SDOH Screenings   Food Insecurity: No Food Insecurity (06/15/2024)  Housing: Low Risk  (06/15/2024)  Transportation Needs: No Transportation Needs (06/15/2024)  Utilities: Not At Risk (06/15/2024)  Alcohol  Screen: Low Risk  (07/08/2023)  Depression (PHQ2-9): Medium Risk (04/28/2024)  Financial Resource Strain: Low Risk  (11/24/2023)  Physical Activity: Sufficiently Active (11/24/2023)  Social Connections: Patient Unable To Answer (06/15/2024)  Stress: No Stress Concern Present (11/24/2023)  Tobacco Use: Low Risk  (06/15/2024)  Health Literacy: Adequate Health Literacy (07/08/2023)    Readmission Risk Interventions     No data to display

## 2024-06-24 NOTE — Plan of Care (Signed)
  Problem: Education: Goal: Knowledge of General Education information will improve Description Including pain rating scale, medication(s)/side effects and non-pharmacologic comfort measures Outcome: Progressing   Problem: Education: Goal: Ability to demonstrate management of disease process will improve Outcome: Progressing Goal: Ability to verbalize understanding of medication therapies will improve Outcome: Progressing   Problem: Activity: Goal: Capacity to carry out activities will improve Outcome: Progressing   Problem: Cardiac: Goal: Ability to achieve and maintain adequate cardiopulmonary perfusion will improve Outcome: Progressing   

## 2024-06-24 NOTE — Progress Notes (Signed)
 Progress Note   Patient: [name] FMW:[id number] DOB: 1925-12-03 DOA: 06/14/2024     10 DOS: the patient was seen and examined on 06/24/2024   Brief hospital course: 89 y.o. female with medical history significant of dCHF, HTN, HLD, PMM due to sinus pause 2007, GERD, CKD-CVA, A-fib, blindness, HOH, who presents with SOB and abnormal labs with hyponatremia, initial sodium level 114.  Patient was admitted with Nephrology consulted for further evaluation and management. Hypertonic saline was started initially with improvement in sodium level up to 121 >> 125. To prevent overcorrection, 3% saline was stopped.  Patient is also being treated with IV Lasix  for hypervolemia.  CT chest was negative for any lung masses.  Palliative care was consulted for goals of care discussions.  Patient and family are considering palliative vs hospice care upon discharge.  Further hospital course and management as outlined below.    Assessment and Plan:  Hyponatremia:  Patient has hypervolemic hyponatremia. Her sodium is 117 in ED. s/p 3% hypertonic saline and IV Lasix .  Initially treated with hypertonic saline. --Nephrology following --Off 3% saline --Sodium improved 114 >> 124 (3% stopped) and s/p gentle fluids started, Na >> 131>>127  --Monitoring off diuretics and fluids  --Will defer resuming of diuretics to nephrology --CT chest negative for lung masses --Serial BMP's to monitor -- Sodium chloride  2 g p.o. twice daily started on 7/17   Hyperkalemia, Lokelma  10 g 1 dose given on 7/16 Lokelma  10 g p.o. 3 times daily x 2 doses on 7/17 Monitor BMP daily   Acute on chronic diastolic CHF (congestive heart failure) (HCC): 2D echo on 06/03/2023 showed EF 55 to 60%.  Patient has SOB, elevated BNP 770, positive JVD and increased interstitial opacity on chest x-ray, clinically consistent with CHF exacerbation. Bilateral pleural effusions - noted on CT chest --Holding diuresis as above --Daily weights,  strict I/O's --Low sodium diet & fluid restriction  AKI superimposed on CKD stage 3a [facility]): Recent baseline creatinine 1.10.   Cr has increased in setting of diuresis, 1.09 >> 1.14 >> 1.34 >> 1.42 (gentle fluids started) >> 1.34 >> 1.10 >> 0.69 >> 0.69 - Holding diuresis - Hold Cozaar , metolazone  and torsemide  as above - Nephrology following - Started on midodrine  for soft BP's - Ensure adequate renal perfusion  Right thigh pain due to acute intramuscular hematoma -- acute onset right anterior thigh pain and swelling on evening 7/12. See soft tissue U/S report from 7/14.   DVT U/S negative.  Right femur and pelvis x-rays negative.   No injury or trauma. On exam, mild tenderness on mid-proximal anterior right thigh. Hbg is down 2 points from last CBC 11.3 >> 9.3. Mild elevation in CK as well, but already resolved --Tylenol  and/or Robaxin  PRN --Hold Lovenox  injections --Monitor for increasing pain or swelling --Monitor Hbg  Acute respiratory failure with hypoxia - due to pleural effusions/CHF and underlying chronic lung disease and bronchiectasis (as seen on CT chest).  No prior home oxygen use.  Pt has been on 2-3 L/min here and endorses shortness of breath. --Supplement O2 to keep sats > 90% --Home O2 qualification today - pt meets criteria, will order home O2 upon d/c  Bronchiectasis, Chronic interstitial lung disease -- as per CT chest findings --PRN bronchodilators --PRN Mucinex  --O2 as above    Primary hypertension: pt with soft and low BP's here -Hold amlodipine , Cozaar , metolazone  and torsemide  due to softer blood pressure. -Metoprolol  dose and changed to tartrate for crushing in applesauce -Started midodrine   5 mg TID due to low BP's - IV hydralazine  as needed   HLD (hyperlipidemia) -Lipitor   Paroxysmal atrial fibrillation [facility]): Patient is not taking anticoagulants currently due to history of macular degeneration and fall risk . Heart rate 60-80s -lowered dose of  metoprolol  from 200 >> 50 mg daily, hold if MAP< 65 -Lopressor  12.5 mg p.o. twice daily, dose decreased on 7/17 due to soft blood pressure.  Follow holding parameters    Goals of care Per Dr. Maree: As per daughter/Wanda/POA, patient has been active until about a month ago.  She was attending daycare center and walking about 30 minutes/day but for about a month now she is not doing much and staying at home.  She is open and interested in exploring possible hospice versus palliative approach to keep her comfortable at home.  Daughter shared that patient is blind and has hearing loss making it very difficult to manage at times --Palliative care and [facility] hospice liaison are following      Subjective: No significant events overnight. Patient's daughter was at bedside, stated that she is little bit confused as compared to yesterday. Management plan discussed.    Physical Exam: Vitals:   06/23/24 2147 06/24/24 0405 06/24/24 0752 06/24/24 1543  BP: (!) 126/48  (!) 103/47 (!) 118/51  Pulse: 70  61 61  Resp: 16  15 18   Temp: (!) 97.5 F (36.4 C)  98.1 F (36.7 C) 98 F (36.7 C)  TempSrc:    Oral  SpO2: 100%  100% 100%  Weight:  57.1 kg    Height:       General exam: awake laying in bed, more talkative today, no acute distress HEENT: moist mucus membranes, very hard of hearing Respiratory system: lungs clear diminished bases, normal respiratory effort at rest on 2.5 L/min [place] O2 Cardiovascular system: normal S1/S2, RRR, no pedal edema.   Gastrointestinal system: soft, NT, ND Central nervous system: A&O x 2+. no gross focal neurologic deficits, normal speech Extremities: right anterior thigh without visible swelling or ecchymosis, no erythema or warmth, but area of mild tenderness on palpation Skin: dry, intact, normal temperature Psychiatry: normal mood, congruent affect   Data Reviewed:  Notable labs -- Na 128 >> 132 >> 131, Cl 91, bicarb 33, glucose 111, BUN 50, Mg 2.5, Hbg  11.3 >> 9.3 (supports R thigh mass being a hematoma)   CK 277 >> 165 normalized  Soft tissue U/S of right thigh 7/14 -- shows a 9x3.4.3 cm complex lobulated mass in the muscle body -- acute intramusclular hematoma vs a hypovascular solid mass.  RLE U/S negative for DVT X-rays of right femur and pelvis were negative.   Family Communication: daughter at bedside on rounds daily   Disposition: Status is: Inpatient Remains inpatient appropriate because: closely monitoring labs for stable sodium and renal function, new onset right thigh pain under evaluation and closely monitoring     Planned Discharge Destination: Home with Home Health    Time spent: 40 minutes  Author: Elvan Sor, MD 06/24/2024 4:54 PM  For on call review [email or web address].

## 2024-06-25 DIAGNOSIS — E871 Hypo-osmolality and hyponatremia: Secondary | ICD-10-CM | POA: Diagnosis not present

## 2024-06-25 LAB — PHOSPHORUS: Phosphorus: 3.2 mg/dL (ref 2.5–4.6)

## 2024-06-25 LAB — BASIC METABOLIC PANEL WITH GFR
Anion gap: 8 (ref 5–15)
BUN: 60 mg/dL — ABNORMAL HIGH (ref 8–23)
CO2: 30 mmol/L (ref 22–32)
Calcium: 8.8 mg/dL — ABNORMAL LOW (ref 8.9–10.3)
Chloride: 89 mmol/L — ABNORMAL LOW (ref 98–111)
Creatinine, Ser: 0.93 mg/dL (ref 0.44–1.00)
GFR, Estimated: 56 mL/min — ABNORMAL LOW (ref >60)
Glucose, Bld: 101 mg/dL — ABNORMAL HIGH (ref 70–99)
Potassium: 5.2 mmol/L — ABNORMAL HIGH (ref 3.5–5.1)
Sodium: 127 mmol/L — ABNORMAL LOW (ref 135–145)

## 2024-06-25 LAB — CBC
HCT: 26 % — ABNORMAL LOW (ref 36.0–46.0)
Hemoglobin: 8.1 g/dL — ABNORMAL LOW (ref 12.0–15.0)
MCH: 32.5 pg (ref 26.0–34.0)
MCHC: 31.2 g/dL (ref 30.0–36.0)
MCV: 104.4 fL — ABNORMAL HIGH (ref 80.0–100.0)
Platelets: 190 K/uL (ref 150–400)
RBC: 2.49 MIL/uL — ABNORMAL LOW (ref 3.87–5.11)
RDW: 13.8 % (ref 11.5–15.5)
WBC: 5.3 K/uL (ref 4.0–10.5)
nRBC: 0 % (ref 0.0–0.2)

## 2024-06-25 LAB — MAGNESIUM: Magnesium: 2.5 mg/dL — ABNORMAL HIGH (ref 1.7–2.4)

## 2024-06-25 MED ORDER — SODIUM ZIRCONIUM CYCLOSILICATE 10 G PO PACK
10.0000 g | PACK | Freq: Once | ORAL | Status: AC
  Administered 2024-06-25: 10 g via ORAL
  Filled 2024-06-25: qty 1

## 2024-06-25 MED ORDER — METOPROLOL SUCCINATE ER 25 MG PO TB24
12.5000 mg | ORAL_TABLET | Freq: Every day | ORAL | Status: DC
  Administered 2024-06-25 – 2024-06-26 (×2): 12.5 mg via ORAL
  Filled 2024-06-25 (×2): qty 1

## 2024-06-25 NOTE — Plan of Care (Signed)
  Problem: Education: Goal: Knowledge of General Education information will improve Description Including pain rating scale, medication(s)/side effects and non-pharmacologic comfort measures Outcome: Progressing   Problem: Education: Goal: Ability to demonstrate management of disease process will improve Outcome: Progressing Goal: Ability to verbalize understanding of medication therapies will improve Outcome: Progressing   Problem: Activity: Goal: Capacity to carry out activities will improve Outcome: Progressing   Problem: Cardiac: Goal: Ability to achieve and maintain adequate cardiopulmonary perfusion will improve Outcome: Progressing   

## 2024-06-25 NOTE — TOC Progression Note (Signed)
 Transition of Care [facility]) - Progression Note    Patient Details  Name: [name] MRN: [id number] Date of Birth: 05-24-1925  [facility] CM/SW Contact  Dalia GORMAN Fuse, RN Phone Number: 06/25/2024, 10:04 AM  Clinical Narrative:    Patient has shara for [employer] when medically clear Approved PlanAuthID:[id number] Dates: 7/17-7/21/2025 Next Review Date: 06/28/2024      Barriers to Discharge: Continued Medical Work up  Expected Discharge Plan and Services In-house Referral: Clinical Social Work     Living arrangements for the past 2 months: Single Family Home                                       Social Determinants of Health (SDOH) Interventions SDOH Screenings   Food Insecurity: No Food Insecurity (06/15/2024)  Housing: Low Risk  (06/15/2024)  Transportation Needs: No Transportation Needs (06/15/2024)  Utilities: Not At Risk (06/15/2024)  Alcohol  Screen: Low Risk  (07/08/2023)  Depression (PHQ2-9): Medium Risk (04/28/2024)  Financial Resource Strain: Low Risk  (11/24/2023)  Physical Activity: Sufficiently Active (11/24/2023)  Social Connections: Patient Unable To Answer (06/15/2024)  Stress: No Stress Concern Present (11/24/2023)  Tobacco Use: Low Risk  (06/15/2024)  Health Literacy: Adequate Health Literacy (07/08/2023)    Readmission Risk Interventions     No data to display

## 2024-06-25 NOTE — Progress Notes (Signed)
 Physical Therapy Treatment Patient Details Name: [name] MRN: [id number] DOB: [birth year] Today's Date: 06/25/2024   History of Present Illness [name] is a 89yoF who comes to [facility] on 06/14/24  c SOB. PMH: CHF, HTN, HLD, CKD, CVA, AF, blindness, HOH. DTR reports recent LEE as well. In ED pt with intermittent confusion, fatigue, imbalance- workup revealing of Na+ 114, BP 84/60mmHg.    PT Comments  Pt received upright in bed agreeable to PT/OT co-treat. Pt maintaining SPO2 > 90% via [place] with activity. Remains needing heavy two person assist and multi modal cuing for bed mobility, STS, SPT and gait with close chair follow for support. Pt remains limited in tolerance due to RLE pain but is pleasant and very motivated to progress to her abilities. Pt left in recliner with all needs in reach. D/c recs remain appropriate.     If plan is discharge home, recommend the following: A lot of help with walking and/or transfers;A lot of help with bathing/dressing/bathroom   Can travel by private vehicle     No  Equipment Recommendations  Other (comment) (TBD)    Recommendations for Other Services       Precautions / Restrictions Precautions Precautions: Fall Recall of Precautions/Restrictions: Impaired Precaution/Restrictions Comments: blind Restrictions Weight Bearing Restrictions Per Provider Order: No     Mobility  Bed Mobility Overal bed mobility: Needs Assistance Bed Mobility: Supine to Sit     Supine to sit: HOB elevated, Max assist, +2 for physical assistance       Patient Response: Cooperative  Transfers Overall transfer level: Needs assistance Equipment used: 2 person hand held assist Transfers: Sit to/from Stand Sit to Stand: Mod assist, +2 physical assistance   Step pivot transfers: +2 physical assistance, Mod assist            Ambulation/Gait Ambulation/Gait assistance: Mod assist, +2 physical assistance Gait Distance (Feet): 6 Feet Assistive device: 2  person hand held assist Gait Pattern/deviations: Step-to pattern       General Gait Details: close chair follow from family member for safety. Heavy BUE support reliance due to antalgic gait on RLE.   Stairs             Wheelchair Mobility     Tilt Bed Tilt Bed Patient Response: Cooperative  Modified Rankin (Stroke Patients Only)       Balance Overall balance assessment: Needs assistance Sitting-balance support: Feet supported Sitting balance-Leahy Scale: Fair     Standing balance support: Bilateral upper extremity supported, During functional activity Standing balance-Leahy Scale: Fair                              Hotel manager: Impaired Factors Affecting Communication: Hearing impaired  Cognition Arousal: Alert Behavior During Therapy: WFL for tasks assessed/performed   PT - Cognitive impairments: No apparent impairments                         Following commands: Intact      Cueing Cueing Techniques: Verbal cues, Tactile cues  Exercises      General Comments        Pertinent Vitals/Pain Pain Assessment Pain Assessment: Faces Faces Pain Scale: Hurts even more Pain Location: RLE pain with movement/mobility Pain Descriptors / Indicators: Discomfort, Grimacing, Guarding, Sore, Tender Pain Intervention(s): Monitored during session, Repositioned    Home Living  Prior Function            PT Goals (current goals can now be found in the care plan section) Acute Rehab PT Goals Patient Stated Goal: get back to walkin' daily PT Goal Formulation: With patient Time For Goal Achievement: 06/30/24 Potential to Achieve Goals: Fair Progress towards PT goals: Progressing toward goals    Frequency    Min 2X/week      PT Plan      Co-evaluation PT/OT/SLP Co-Evaluation/Treatment: Yes Reason for Co-Treatment: To address functional/ADL transfers;Necessary to  address cognition/behavior during functional activity;For patient/therapist safety PT goals addressed during session: Mobility/safety with mobility;Balance OT goals addressed during session: ADL's and self-care;Proper use of Adaptive equipment and DME      AM-PAC PT 6 Clicks Mobility   Outcome Measure  Help needed turning from your back to your side while in a flat bed without using bedrails?: A Lot Help needed moving from lying on your back to sitting on the side of a flat bed without using bedrails?: A Lot Help needed moving to and from a bed to a chair (including a wheelchair)?: A Lot Help needed standing up from a chair using your arms (e.g., wheelchair or bedside chair)?: A Lot Help needed to walk in hospital room?: A Lot Help needed climbing 3-5 steps with a railing? : Total 6 Click Score: 11    End of Session Equipment Utilized During Treatment: Gait belt Activity Tolerance: Patient tolerated treatment well Patient left: in chair;with call bell/phone within reach;with chair alarm set;with family/visitor present Nurse Communication: Mobility status PT Visit Diagnosis: Unsteadiness on feet (R26.81);Repeated falls (R29.6);Other abnormalities of gait and mobility (R26.89);Muscle weakness (generalized) (M62.81);Difficulty in walking, not elsewhere classified (R26.2);Pain Pain - Right/Left: Right Pain - part of body: Leg     Time: 1419-1445 PT Time Calculation (min) (ACUTE ONLY): 26 min  Charges:    $Therapeutic Activity: 8-22 mins PT General Charges $$ ACUTE PT VISIT: 1 Visit                    Dorina HERO. Fairly IV, PT, DPT Physical Therapist- [place]  [facility] 06/25/2024, 3:25 PM

## 2024-06-25 NOTE — Progress Notes (Signed)
 Occupational Therapy Treatment Patient Details Name: Nyrie Sigal MRN: [id number] DOB: 1925/08/29 Today's Date: 06/25/2024   History of present illness Joby Hershkowitz is a 89yoF who comes to [facility] on 06/14/24  c SOB. PMH: CHF, HTN, HLD, CKD, CVA, AF, blindness, HOH. DTR reports recent LEE as well. In ED pt with intermittent confusion, fatigue, imbalance- workup revealing of Na+ 114, BP 84/36mmHg.   OT comments  Chart reviewed to date, pt greeted in bed, agreeable to OT tx session targeting improving functional tolerance in prep for ADL tasks. Improvements noted on this date as evidenced by pt amb approx 6' with MOD A +2 via HHA with close chair follow, frequent cueing for technique/body mechanics. Pt continues to require assist for ADLs such as MAX A for dressing. Pt is making progress towards goals, discharge recommendation remains appropriate.OT will continue to follow.       If plan is discharge home, recommend the following:  Help with stairs or ramp for entrance;A lot of help with walking and/or transfers;A lot of help with bathing/dressing/bathroom   Equipment Recommendations  BSC/3in1    Recommendations for Other Services      Precautions / Restrictions Precautions Precautions: Fall Recall of Precautions/Restrictions: Impaired Precaution/Restrictions Comments: blind Restrictions Weight Bearing Restrictions Per Provider Order: No       Mobility Bed Mobility Overal bed mobility: Needs Assistance Bed Mobility: Supine to Sit Rolling: Max assist   Supine to sit: HOB elevated, Max assist, +2 for physical assistance          Transfers Overall transfer level: Needs assistance Equipment used: 2 person hand held assist Transfers: Sit to/from Stand Sit to Stand: Mod assist, +2 physical assistance                 Balance Overall balance assessment: Needs assistance Sitting-balance support: Feet supported Sitting balance-Leahy Scale: Fair     Standing balance support:  Bilateral upper extremity supported, During functional activity Standing balance-Leahy Scale: Fair                             ADL either performed or assessed with clinical judgement   ADL Overall ADL's : Needs assistance/impaired                     Lower Body Dressing: Maximal assistance Lower Body Dressing Details (indicate cue type and reason): socks Toilet Transfer: Moderate assistance;+2 for safety/equipment;+2 for physical assistance;Ambulation Toilet Transfer Details (indicate cue type and reason): step pivot via HHA         Functional mobility during ADLs: Moderate assistance;+2 for physical assistance;+2 for safety/equipment (via HHA, approx 6' one attempt , 2 ' one attempt; frequent multi modal cues for attempts)      Extremity/Trunk Assessment              Vision       Perception     Praxis     Communication Communication Communication: Impaired Factors Affecting Communication: Hearing impaired   Cognition Arousal: Alert Behavior During Therapy: WFL for tasks assessed/performed Cognition: No apparent impairments                               Following commands: Intact        Cueing   Cueing Techniques: Verbal cues, Tactile cues  Exercises Other Exercises Other Exercises: edu pt/grand daugther re; role of OT, improtance of progressing  mobility    Shoulder Instructions       General Comments vss throughout on 2 L via [place]    Pertinent Vitals/ Pain       Pain Assessment Pain Assessment: Faces Faces Pain Scale: Hurts even more Pain Location: RLE pain with movement/mobility Pain Descriptors / Indicators: Discomfort, Grimacing, Guarding, Sore, Tender Pain Intervention(s): Monitored during session, Repositioned  Home Living                                          Prior Functioning/Environment              Frequency  Min 2X/week        Progress Toward Goals  OT Goals(current  goals can now be found in the care plan section)  Progress towards OT goals: Progressing toward goals  Acute Rehab OT Goals Time For Goal Achievement: 06/30/24  Plan      Co-evaluation    PT/OT/SLP Co-Evaluation/Treatment: Yes Reason for Co-Treatment: To address functional/ADL transfers;Necessary to address cognition/behavior during functional activity;For patient/therapist safety PT goals addressed during session: Mobility/safety with mobility;Balance OT goals addressed during session: ADL's and self-care;Proper use of Adaptive equipment and DME      AM-PAC OT 6 Clicks Daily Activity     Outcome Measure   Help from another person eating meals?: A Little Help from another person taking care of personal grooming?: A Little Help from another person toileting, which includes using toliet, bedpan, or urinal?: A Little Help from another person bathing (including washing, rinsing, drying)?: A Lot Help from another person to put on and taking off regular upper body clothing?: A Little Help from another person to put on and taking off regular lower body clothing?: A Lot 6 Click Score: 16    End of Session Equipment Utilized During Treatment: Gait belt;Oxygen  OT Visit Diagnosis: Unsteadiness on feet (R26.81);Muscle weakness (generalized) (M62.81);History of falling (Z91.81)   Activity Tolerance Patient tolerated treatment well   Patient Left with family/visitor present;in chair;with chair alarm set;with call bell/phone within reach   Nurse Communication Mobility status        Time: 8581-8555 OT Time Calculation (min): 26 min  Charges: OT General Charges $OT Visit: 1 Visit OT Treatments $Therapeutic Activity: 8-22 mins  Therisa Sheffield, OTD OTR/L  06/25/24, 3:55 PM

## 2024-06-25 NOTE — Progress Notes (Signed)
 Progress Note   Patient: [name] FMW:[id number] DOB: 01/04/1925 DOA: 06/14/2024     11 DOS: the patient was seen and examined on 06/25/2024   Brief hospital course: 89 y.o. female with medical history significant of dCHF, HTN, HLD, PMM due to sinus pause 2007, GERD, CKD-CVA, A-fib, blindness, HOH, who presents with SOB and abnormal labs with hyponatremia, initial sodium level 114.  Patient was admitted with Nephrology consulted for further evaluation and management. Hypertonic saline was started initially with improvement in sodium level up to 121 >> 125. To prevent overcorrection, 3% saline was stopped.  Patient is also being treated with IV Lasix  for hypervolemia.  CT chest was negative for any lung masses.  Palliative care was consulted for goals of care discussions.  Patient and family are considering palliative vs hospice care upon discharge.  Further hospital course and management as outlined below.    Assessment and Plan:  Hyponatremia:  Patient has hypervolemic hyponatremia. Her sodium is 117 in ED. s/p 3% hypertonic saline and IV Lasix .  Initially treated with hypertonic saline. --Nephrology following --Off 3% saline --Sodium improved 114 >> 124 (3% stopped) and s/p gentle fluids started, Na >> 131>>127  --Monitoring off diuretics and fluids  --Will defer resuming of diuretics to nephrology --CT chest negative for lung masses --Serial BMP's to monitor -- Sodium chloride  2 g p.o. twice daily started on 7/17   Hyperkalemia, Lokelma  10 g 1 dose given on 7/16 Lokelma  10 g p.o. 3 times daily x 2 doses on 7/17 Lokelma  10 g p.o. one-time dose given on 7/18 Monitor BMP daily   Acute on chronic diastolic CHF (congestive heart failure) (HCC): 2D echo on 06/03/2023 showed EF 55 to 60%.  Patient has SOB, elevated BNP 770, positive JVD and increased interstitial opacity on chest x-ray, clinically consistent with CHF exacerbation. Bilateral pleural effusions - noted on CT  chest --Holding diuresis as above --Daily weights, strict I/O's --Low sodium diet & fluid restriction  AKI superimposed on CKD stage 3a [facility]): Recent baseline creatinine 1.10.   Cr has increased in setting of diuresis, 1.09 >> 1.14 >> 1.34 >> 1.42 (gentle fluids started) >> 1.34 >> 1.10 >> 0.69 >> 0.69 - Holding diuresis - Hold Cozaar , metolazone  and torsemide  as above - Nephrology following - Started on midodrine  for soft BP's - Ensure adequate renal perfusion  Right thigh pain due to acute intramuscular hematoma -- acute onset right anterior thigh pain and swelling on evening 7/12. See soft tissue U/S report from 7/14.   DVT U/S negative.  Right femur and pelvis x-rays negative.   No injury or trauma. On exam, mild tenderness on mid-proximal anterior right thigh. Hbg is down 2 points from last CBC 11.3 >> 9.3. Mild elevation in CK as well, but already resolved --Tylenol  and/or Robaxin  PRN --Hold Lovenox  injections --Monitor for increasing pain or swelling --Monitor Hbg  Acute respiratory failure with hypoxia - due to pleural effusions/CHF and underlying chronic lung disease and bronchiectasis (as seen on CT chest).  No prior home oxygen use.  Pt has been on 2-3 L/min here and endorses shortness of breath. --Supplement O2 to keep sats > 90% --Home O2 qualification today - pt meets criteria, will order home O2 upon d/c  Bronchiectasis, Chronic interstitial lung disease -- as per CT chest findings --PRN bronchodilators --PRN Mucinex  --O2 as above    Primary hypertension: pt with soft and low BP's here -Hold amlodipine , Cozaar , metolazone  and torsemide  due to softer blood pressure. -Metoprolol  dose and  changed to tartrate for crushing in applesauce -Started midodrine  5 mg TID due to low BP's - IV hydralazine  as needed   HLD (hyperlipidemia) -Lipitor   Paroxysmal atrial fibrillation [facility]): Patient is not taking anticoagulants currently due to history of macular degeneration and  fall risk . Heart rate 60-80s -lowered dose of metoprolol  from 200 >> 50 mg daily, hold if MAP< 65 -Lopressor  12.5 mg p.o. twice daily, dose decreased on 7/17 due to soft blood pressure.  Follow holding parameters -Toprol -XL 12.5 mg p.o. daily started on 7/18, decreased dose due to hypotension   Goals of care Per Dr. Maree: As per daughter/Wanda/POA, patient has been active until about a month ago.  She was attending daycare center and walking about 30 minutes/day but for about a month now she is not doing much and staying at home.  She is open and interested in exploring possible hospice versus palliative approach to keep her comfortable at home.  Daughter shared that patient is blind and has hearing loss making it very difficult to manage at times --Palliative care and [facility] hospice liaison are following      Subjective: No significant events overnight. Patient's daughter was at bedside, stated that she is little bit better as compared to yesterday. Patient was sleeping comfortably, not in acute distress Patient seemed very lethargic and tired.   Physical Exam: Vitals:   06/24/24 2121 06/25/24 0358 06/25/24 0527 06/25/24 0755  BP: (!) 113/43 (!) 107/45  (!) 112/51  Pulse: 74 71  62  Resp: 17 17  19   Temp: 98.4 F (36.9 C) 98 F (36.7 C)  98.2 F (36.8 C)  TempSrc:  Axillary    SpO2: 93% 96%  99%  Weight:  57.4 kg 57.1 kg   Height:       General exam: awake laying in bed, more talkative today, no acute distress HEENT: moist mucus membranes, very hard of hearing Respiratory system: lungs clear diminished bases, normal respiratory effort at rest on 2.5 L/min [place] O2 Cardiovascular system: normal S1/S2, RRR, no pedal edema.   Gastrointestinal system: soft, NT, ND Central nervous system: A&O x 2+. no gross focal neurologic deficits, normal speech Extremities: right anterior thigh without visible swelling or ecchymosis, no erythema or warmth, but area of mild tenderness on  palpation Skin: dry, intact, normal temperature Psychiatry: normal mood, congruent affect   Data Reviewed:   Soft tissue U/S of right thigh 7/14 -- shows a 9x3.4.3 cm complex lobulated mass in the muscle body -- acute intramusclular hematoma vs a hypovascular solid mass.  RLE U/S negative for DVT X-rays of right femur and pelvis were negative.   Family Communication: daughter at bedside on rounds daily   Disposition: Status is: Inpatient Remains inpatient appropriate because: closely monitoring labs for stable sodium and renal function, new onset right thigh pain under evaluation and closely monitoring     Planned Discharge Destination: Home with Home Health    Time spent: 40 minutes  Author: Elvan Sor, MD 06/25/2024 4:29 PM  For on call review [email or web address].

## 2024-06-26 DIAGNOSIS — E871 Hypo-osmolality and hyponatremia: Secondary | ICD-10-CM | POA: Diagnosis not present

## 2024-06-26 LAB — BASIC METABOLIC PANEL WITH GFR
Anion gap: 9 (ref 5–15)
BUN: 58 mg/dL — ABNORMAL HIGH (ref 8–23)
CO2: 30 mmol/L (ref 22–32)
Calcium: 8.9 mg/dL (ref 8.9–10.3)
Chloride: 91 mmol/L — ABNORMAL LOW (ref 98–111)
Creatinine, Ser: 1.02 mg/dL — ABNORMAL HIGH (ref 0.44–1.00)
GFR, Estimated: 50 mL/min — ABNORMAL LOW (ref >60)
Glucose, Bld: 103 mg/dL — ABNORMAL HIGH (ref 70–99)
Potassium: 5 mmol/L (ref 3.5–5.1)
Sodium: 130 mmol/L — ABNORMAL LOW (ref 135–145)

## 2024-06-26 LAB — CBC
HCT: 25.7 % — ABNORMAL LOW (ref 36.0–46.0)
Hemoglobin: 8.3 g/dL — ABNORMAL LOW (ref 12.0–15.0)
MCH: 32.7 pg (ref 26.0–34.0)
MCHC: 32.3 g/dL (ref 30.0–36.0)
MCV: 101.2 fL — ABNORMAL HIGH (ref 80.0–100.0)
Platelets: 203 K/uL (ref 150–400)
RBC: 2.54 MIL/uL — ABNORMAL LOW (ref 3.87–5.11)
RDW: 13.9 % (ref 11.5–15.5)
WBC: 5.8 K/uL (ref 4.0–10.5)
nRBC: 0 % (ref 0.0–0.2)

## 2024-06-26 LAB — PHOSPHORUS: Phosphorus: 2.3 mg/dL — ABNORMAL LOW (ref 2.5–4.6)

## 2024-06-26 LAB — MAGNESIUM: Magnesium: 2.4 mg/dL (ref 1.7–2.4)

## 2024-06-26 MED ORDER — GLYCOPYRROLATE 0.2 MG/ML IJ SOLN
0.2000 mg | INTRAMUSCULAR | Status: DC | PRN
  Administered 2024-06-26 – 2024-06-27 (×2): 0.2 mg via INTRAVENOUS
  Filled 2024-06-26 (×2): qty 1

## 2024-06-26 MED ORDER — HALOPERIDOL 0.5 MG PO TABS
0.5000 mg | ORAL_TABLET | ORAL | Status: DC | PRN
Start: 2024-06-26 — End: 2024-06-27

## 2024-06-26 MED ORDER — LORAZEPAM 2 MG/ML IJ SOLN
1.0000 mg | Freq: Once | INTRAMUSCULAR | Status: AC
  Administered 2024-06-26: 1 mg via INTRAVENOUS
  Filled 2024-06-26: qty 1

## 2024-06-26 MED ORDER — HALOPERIDOL LACTATE 5 MG/ML IJ SOLN: 0.5000 mg | INTRAMUSCULAR | Status: DC | PRN

## 2024-06-26 MED ORDER — SODIUM PHOSPHATES 45 MMOLE/15ML IV SOLN
30.0000 mmol | Freq: Once | INTRAVENOUS | Status: AC
  Administered 2024-06-26: 30 mmol via INTRAVENOUS
  Filled 2024-06-26: qty 10

## 2024-06-26 MED ORDER — ENOXAPARIN SODIUM 30 MG/0.3ML IJ SOSY: 30.0000 mg | PREFILLED_SYRINGE | Freq: Every evening | INTRAMUSCULAR | Status: DC

## 2024-06-26 MED ORDER — GLYCOPYRROLATE 0.2 MG/ML IJ SOLN: 0.2000 mg | INTRAMUSCULAR | Status: DC | PRN

## 2024-06-26 MED ORDER — GLYCOPYRROLATE 1 MG PO TABS: 1.0000 mg | ORAL_TABLET | ORAL | Status: DC | PRN

## 2024-06-26 MED ORDER — HALOPERIDOL LACTATE 2 MG/ML PO CONC: 0.5000 mg | ORAL | Status: DC | PRN

## 2024-06-26 MED ORDER — OXYCODONE HCL 20 MG/ML PO CONC: 5.0000 mg | ORAL | Status: DC | PRN

## 2024-06-26 MED ORDER — FENTANYL CITRATE PF 50 MCG/ML IJ SOSY: 50.0000 ug | PREFILLED_SYRINGE | INTRAMUSCULAR | Status: DC | PRN

## 2024-06-26 MED ORDER — OXYCODONE HCL 20 MG/ML PO CONC
5.0000 mg | ORAL | Status: DC | PRN
  Administered 2024-06-27: 5 mg via SUBLINGUAL
  Filled 2024-06-26: qty 0.3

## 2024-06-26 NOTE — Progress Notes (Signed)
 Progress Note   Patient: [name] FMW:[id number] DOB: 1925-11-02 DOA: 06/14/2024     12 DOS: the patient was seen and examined on 06/26/2024   Brief hospital course: 89 y.o. female with medical history significant of dCHF, HTN, HLD, PMM due to sinus pause 2007, GERD, CKD-CVA, A-fib, blindness, HOH, who presents with SOB and abnormal labs with hyponatremia, initial sodium level 114.  Patient was admitted with Nephrology consulted for further evaluation and management. Hypertonic saline was started initially with improvement in sodium level up to 121 >> 125. To prevent overcorrection, 3% saline was stopped.  Patient is also being treated with IV Lasix  for hypervolemia.  CT chest was negative for any lung masses.  Palliative care was consulted for goals of care discussions.  Patient and family are considering palliative vs hospice care upon discharge.  Further hospital course and management as outlined below.  7/19 transition to comfort measures as per patient's daughter.   Assessment and Plan:  # Comfort measures only: Transitioned on 7/19 As per patient's daughter patient's condition is deteriorating and after discussion with family they decided to transition to comfort measures, discontinue home medications and no blood draws. Will consult to see for hospice  During hospital stay patient was managed as below.  # Hyponatremia:  Patient has hypervolemic hyponatremia. Her sodium is 117 in ED. s/p 3% hypertonic saline and IV Lasix .  Initially treated with hypertonic saline. Seen by Nephrology, s/p 3% saline. Sodium improved 114 >> 124 (3% stopped) and s/p gentle fluids started, Na >> 131>>127  CT chest negative for lung masses S/p Nacl 2 g p.o. BID from 7/17 to 7/19.  Comfort care now  Hyperkalemia, Lokelma  10 g 1 dose given on 7/16 Lokelma  10 g p.o. 3 times daily x 2 doses on 7/17 Lokelma  10 g p.o. one-time dose given on 7/18 Hyperkalemia resolved.  Continue comfort manage  no  Acute on chronic diastolic CHF (congestive heart failure) (HCC): 2D echo on 06/03/2023 showed EF 55 to 60%.  Patient has SOB, elevated BNP 770, positive JVD and increased interstitial opacity on chest x-ray, clinically consistent with CHF exacerbation. Bilateral pleural effusions - noted on CT chest Holding diuresis as above.  Continue comfort measures  AKI superimposed on CKD stage 3a [facility]): Recent baseline creatinine 1.10.   Cr has increased in setting of diuresis, 1.09 >> 1.14 >> 1.34 >> 1.42 (gentle fluids started) >> 1.34 >> 1.10 >> 0.69 >> 0.69 Holding diuresis Hold Cozaar , metolazone  and torsemide  as above Nephrology was consulted.   S/p Midodrine  for soft BP's, DC'd on 7/19, continue comfort measures.  Right thigh pain due to acute intramuscular hematoma -- acute onset right anterior thigh pain and swelling on evening 7/12. See soft tissue U/S report from 7/14.   DVT U/S negative.  Right femur and pelvis x-rays negative.   No injury or trauma. On exam, mild tenderness on mid-proximal anterior right thigh. Hbg is down 2 points from last CBC 11.3 >> 9.3. Mild elevation in CK as well, but already resolved Tylenol  and/or Robaxin  PRN Hold Lovenox  injections Monitor for increasing pain or swelling Continue comfort measures for now  Acute respiratory failure with hypoxia - due to pleural effusions/CHF and underlying chronic lung disease and bronchiectasis (as seen on CT chest).  No prior home oxygen use.  Pt has been on 2-3 L/min here and endorses shortness of breath. --Supplement O2 to keep sats > 90%  Bronchiectasis, Chronic interstitial lung disease -- as per CT chest findings --PRN bronchodilators --  O2 as above    Primary hypertension: pt with soft and low BP's here -Hold amlodipine , Cozaar , metolazone  and torsemide  due to softer blood pressure. S/p Metoprolol  dose and changed to tartrate for crushing in applesauce S/p Midodrine  5 mg TID due to low BP's - IV hydralazine   as needed   HLD (hyperlipidemia) s/p Lipitor, d/c;d on 7/19   Paroxysmal atrial fibrillation [facility]): Patient is not taking anticoagulants currently due to history of macular degeneration and fall risk . Heart rate 60-80s -lowered dose of metoprolol  from 200 >> 50 mg daily, -s/p Lopressor  12.5 mg p.o. twice daily, dose decreased on 7/17 due to soft blood pressure.  Follow holding parameters S/p Toprol -XL 12.5 mg p.o. daily started on 7/18, decreased dose due to hypotension Discontinued all medications on 7/19, patient was transition to comfort measures only.  Goals of care Per Dr. Maree: As per daughter/[name]/POA, patient has been active until about a month ago.  She was attending daycare center and walking about 30 minutes/day but for about a month now she is not doing much and staying at home.  She is open and interested in exploring possible hospice versus palliative approach to keep her comfortable at home.  Daughter shared that patient is blind and has hearing loss making it very difficult to manage at times --Palliative care and [facility] hospice liaison are following  7/19 we will inform TOC to consult hospice for possible hospice home placement.     Subjective: No significant events overnight. In the morning time patient was not doing well, had increased work of breathing and anxiety.  Patient did not have a good night sleep. Patient's daughter agreed for Ativan  1 mg, after that patient was sleepy and did not even eat lunch and unable to take any medications. After discussion with patient's daughter with family members she decided for comfort measures.  Discontinued all medications and no blood draws.   Physical Exam: Vitals:   06/26/24 0811 06/26/24 1337 06/26/24 1546 06/26/24 1603  BP: (!) 151/64 (!) 127/53 (!) 132/52   Pulse: 79 66 77   Resp: 19 20 18    Temp: 98.6 F (37 C)  98.7 F (37.1 C)   TempSrc:   Oral   SpO2: 93%  (!) 88% 92%  Weight:      Height:        General exam: Awake, anxious, short of breath HEENT: moist mucus membranes, very hard of hearing Respiratory system: Equal air entry bilaterally, mild bibasilar crackles, increased work of breathing  Cardiovascular system: Irregular rhythm, no pedal edema.   Gastrointestinal system: soft, NT, ND Central nervous system: no gross focal neurologic deficits Extremities: right anterior thigh without visible swelling or ecchymosis, no erythema or warmth, but area of mild tenderness on palpation Skin: dry, intact, normal temperature  Data Reviewed:   Soft tissue U/S of right thigh 7/14 -- shows a 9x3.4.3 cm complex lobulated mass in the muscle body -- acute intramusclular hematoma vs a hypovascular solid mass.  RLE U/S negative for DVT X-rays of right femur and pelvis were negative.   Family Communication: daughter at bedside on rounds daily   Disposition: Status is: Inpatient Remains inpatient appropriate because: closely monitoring labs for stable sodium and renal function, new onset right thigh pain under evaluation and closely monitoring  7/19 transition to comfort measures only.  Patient can be discharged to hospice facility when accepted.   Planned Discharge Destination: Home with Home Health    Time spent: 40 minutes  Author: Elvan  Von, MD 06/26/2024 4:11 PM  For on call review [email or web address].

## 2024-06-27 DIAGNOSIS — E871 Hypo-osmolality and hyponatremia: Secondary | ICD-10-CM | POA: Diagnosis not present

## 2024-07-01 ENCOUNTER — Encounter: Admitting: Family

## 2024-07-09 NOTE — Plan of Care (Signed)
 The patient is comfort care. Respirations have been elevated tonight to 32. PRN Robinol and PRN Oxycodone  have been given. O2 Sat is at 95% with O2 at 4 LPM via [place].

## 2024-07-09 NOTE — Death Summary Note (Signed)
 DEATH SUMMARY   Patient Details  Name: [name] MRN: [id number] DOB: 03-13-1925 ERE:Dpffnwd-Mnapwdnw, Rockie, MD Admission/Discharge Information   Admit Date:  Jun 21, 2024  Date of Death: Date of Death: 2024/07/04  Time of Death: Time of Death: 0924  Length of Stay: 06/30/2024   Principle Cause of death: Hyponatremia  Hospital Diagnoses: Principal Problem:   Hyponatremia Active Problems:   Acute on chronic diastolic CHF (congestive heart failure) (HCC)   Primary hypertension   HLD (hyperlipidemia)   Paroxysmal atrial fibrillation (HCC)   Chronic kidney disease, stage 3b [facility])   Hospital Course: 89 y.o. female with medical history significant of dCHF, HTN, HLD, PMM due to sinus pause Jul 14, 2006, GERD, CKD-CVA, A-fib, blindness, HOH, who presents with SOB and abnormal labs with hyponatremia, initial sodium level 114.   Patient was admitted with Nephrology consulted for further evaluation and management. Hypertonic saline was started initially with improvement in sodium level up to 121 >> 125. To prevent overcorrection, 3% saline was stopped.  Patient is also being treated with IV Lasix  for hypervolemia.  CT chest was negative for any lung masses.   Palliative care was consulted for goals of care discussions.  Patient and family are considering palliative vs hospice care upon discharge.   Further hospital course and management as outlined below.   7/19 transition to comfort measures as per patient's daughter. 04-Jul-2024 patient passed away today, death was pronounced by RN.    Assessment and Plan:   # Comfort measures only: Transitioned on 7/19 As per patient's daughter patient's condition is deteriorating and after discussion with family they decided to transition to comfort measures, discontinue home medications and no blood draws. Will consult to see for hospice   During hospital stay patient was managed as below.   # Hyponatremia:  Patient has hypervolemic hyponatremia. Her sodium is  117 in ED. s/p 3% hypertonic saline and IV Lasix .  Initially treated with hypertonic saline. Seen by Nephrology, s/p 3% saline. Sodium improved 114 >> 124 (3% stopped) and s/p gentle fluids started, Na >> 131>>127  CT chest negative for lung masses S/p Nacl 2 g p.o. BID from 7/17 to 7/19.  Comfort care now   Hyperkalemia, Lokelma  10 g 1 dose given on 7/16 Lokelma  10 g p.o. 3 times daily x 2 doses on 7/17 Lokelma  10 g p.o. one-time dose given on 7/18 Hyperkalemia resolved.  Continue comfort manage no   Acute on chronic diastolic CHF (congestive heart failure) (HCC): 2D echo on 06/03/2023 showed EF 55 to 60%.  Patient has SOB, elevated BNP 770, positive JVD and increased interstitial opacity on chest x-ray, clinically consistent with CHF exacerbation. Bilateral pleural effusions - noted on CT chest Holding diuresis as above.  Continue comfort measures   AKI superimposed on CKD stage 3a [facility]): Recent baseline creatinine 1.10.   Cr has increased in setting of diuresis, 1.09 >> 1.14 >> 1.34 >> 1.42 (gentle fluids started) >> 1.34 >> 1.10 >> 0.69 >> 0.69 Holding diuresis Hold Cozaar , metolazone  and torsemide  as above Nephrology was consulted.   S/p Midodrine  for soft BP's, DC'd on 7/19, continue comfort measures.   Right thigh pain due to acute intramuscular hematoma -- acute onset right anterior thigh pain and swelling on evening 7/12. See soft tissue U/S report from 7/14.   DVT U/S negative.  Right femur and pelvis x-rays negative.   No injury or trauma. On exam, mild tenderness on mid-proximal anterior right thigh. Hbg is down 2 points from last CBC 11.3 >>  9.3. Mild elevation in CK as well, but already resolved Tylenol  and/or Robaxin  PRN Hold Lovenox  injections Monitor for increasing pain or swelling Continue comfort measures for now   Acute respiratory failure with hypoxia - due to pleural effusions/CHF and underlying chronic lung disease and bronchiectasis (as seen on CT chest).  No  prior home oxygen use.  Pt has been on 2-3 L/min here and endorses shortness of breath. --Supplement O2 to keep sats > 90%   Bronchiectasis, Chronic interstitial lung disease -- as per CT chest findings --PRN bronchodilators --O2 as above    Primary hypertension: pt with soft and low BP's here -Hold amlodipine , Cozaar , metolazone  and torsemide  due to softer blood pressure. S/p Metoprolol  dose and changed to tartrate for crushing in applesauce S/p Midodrine  5 mg TID due to low BP's - IV hydralazine  as needed   HLD (hyperlipidemia) s/p Lipitor, d/c;d on 7/19   Paroxysmal atrial fibrillation [facility]): Patient is not taking anticoagulants currently due to history of macular degeneration and fall risk . Heart rate 60-80s -lowered dose of metoprolol  from 200 >> 50 mg daily, -s/p Lopressor  12.5 mg p.o. twice daily, dose decreased on 7/17 due to soft blood pressure.  Follow holding parameters S/p Toprol -XL 12.5 mg p.o. daily started on 7/18, decreased dose due to hypotension Discontinued all medications on 7/19, patient was transition to comfort measures only.   Goals of care Per Dr. Maree: As per daughter/Wanda/POA, patient has been active until about a month ago.  She was attending daycare center and walking about 30 minutes/day but for about a month now she is not doing much and staying at home.  She is open and interested in exploring possible hospice versus palliative approach to keep her comfortable at home.  Daughter shared that patient is blind and has hearing loss making it very difficult to manage at times --Palliative care and [facility] hospice liaison are following   7/19 we will inform TOC to consult hospice for possible hospice home placement.   Procedures: None  Consultations: Nephrology and palliative care Hospice was following as well  The results of significant diagnostics from this hospitalization (including imaging, microbiology, ancillary and laboratory) are listed  below for reference.   Significant Diagnostic Studies: US  RT LOWER EXTREM LTD SOFT TISSUE NON VASCULAR Result Date: 06/21/2024 CLINICAL DATA:  Acute right thigh pain EXAM: ULTRASOUND RIGHT LOWER EXTREMITY LIMITED TECHNIQUE: Ultrasound examination of the lower extremity soft tissues was performed in the area of clinical concern. COMPARISON:  None Available. FINDINGS: Limited grayscale and color Doppler sonography of the anterior right thigh in the area of reported focal tenderness demonstrates a corresponding complex lobulated mass demonstrating heterogeneous echogenicity located within the muscle belly of the anterior compartment of the right thigh. No significant internal vascularity is identified. This measures 9.0 x 3.0 x 4.3 cm and may represent a acute intramuscular hematoma or a hypovascular solid mass . IMPRESSION: 1. 9.0 x 3.0 x 4.3 cm complex lobulated mass within the muscle belly of the anterior compartment of the right thigh. This may represent a acute intramuscular hematoma or a hypovascular solid mass. Contrast enhanced MRI examination is recommended further evaluation. Electronically Signed   By: Dorethia Molt M.D.   On: 06/21/2024 19:42   DG Pelvis 1-2 Views Result Date: 06/21/2024 CLINICAL DATA:  Right hip and upper thigh pain when moving or turning in bed. No trauma history is submitted. EXAM: PELVIS - 1-2 VIEW COMPARISON:  None Available. FINDINGS: Single AP view of the pelvis demonstrates  vascular calcifications. Femoral heads are located. Joint spaces are maintained for age. Degenerative sclerosis of the bilateral sacroiliac joints. IMPRESSION: No acute osseous abnormality. Electronically Signed   By: Rockey Kilts M.D.   On: 06/21/2024 10:20   DG FEMUR, MIN 2 VIEWS RIGHT Result Date: 06/21/2024 CLINICAL DATA:  Acute right leg pain without reported injury. EXAM: RIGHT FEMUR 2 VIEWS COMPARISON:  None Available. FINDINGS: There is no evidence of fracture or other focal bone lesions. Soft  tissues are unremarkable. IMPRESSION: Negative. Electronically Signed   By: Lynwood Landy Raddle M.D.   On: 06/21/2024 10:13   US  Venous Img Lower Unilateral Right (DVT) Result Date: 06/21/2024 CLINICAL DATA:  [id number] Right leg swelling [id number] EXAM: RIGHT LOWER EXTREMITY VENOUS DOPPLER ULTRASOUND TECHNIQUE: Gray-scale sonography with compression, as well as color and duplex ultrasound, were performed to evaluate the deep venous system(s) from the level of the common femoral vein through the popliteal and proximal calf veins. COMPARISON:  Lower extremity venous duplex, 03/12/2024. FINDINGS: VENOUS Normal compressibility of the RIGHT common femoral, superficial femoral, and popliteal veins, as well as the visualized calf veins. Visualized portions of profunda femoral vein and great saphenous vein unremarkable. No filling defects to suggest DVT on grayscale or color Doppler imaging. Doppler waveforms show normal direction of venous flow, normal respiratory plasticity and response to augmentation. Limited views of the contralateral common femoral vein are unremarkable. OTHER No evidence of superficial thrombophlebitis or abnormal fluid collection. Limitations: none IMPRESSION: No evidence of femoropopliteal DVT or superficial thrombophlebitis within the RIGHT lower extremity. Thom Hall, MD [facility] Radiology Electronically Signed   By: Thom Hall M.D.   On: 06/21/2024 07:00   CT CHEST WO CONTRAST Result Date: 06/15/2024 CLINICAL DATA:  Severe hyponatremia. Concern for underlying lung cancer. EXAM: CT CHEST WITHOUT CONTRAST TECHNIQUE: Multidetector CT imaging of the chest was performed following the standard protocol without IV contrast. RADIATION DOSE REDUCTION: This exam was performed according to the departmental dose-optimization program which includes automated exposure control, adjustment of the mA and/or kV according to patient size and/or use of iterative  reconstruction technique. COMPARISON:  Chest radiograph 06/14/2024, 03/17/2024, in 02/03/2022 FINDINGS: Cardiovascular: Cardiac enlargement. No pericardial effusions. Cardiac pacemaker. Calcification in the mitral valve annulus, aortic, and coronary arteries. No aortic aneurysm. Mediastinum/Nodes: Thyroid gland is unremarkable. Esophagus is moderately distended and fluid-filled with air-fluid level down to the mid/distal esophageal region. No discrete obstructing lesion is identified. Changes most likely to represent dysmotility but underlying stricture is not excluded. Consider follow-up versus endoscopy. Scattered mediastinal lymph nodes are not pathologically enlarged. Lungs/Pleura: Small bilateral pleural effusions. Diffuse interstitial pattern to the lungs most prominent in the periphery and bases with some associated honeycomb changes and bronchiectasis. 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 No superimposed consolidation or focal mass lesion identified. No pneumothorax. Upper Abdomen: No acute abnormalities. Musculoskeletal: Soft tissue edema in the chest wall and visualized upper abdominal wall. Degenerative changes in the spine and shoulders. Compression of T7 vertebra. This is unchanged since prior chest radiograph of 03/17/2024. No acute bony abnormalities. IMPRESSION: 1. Cardiac enlargement. 2. Small bilateral pleural effusions, greater on the right. 3. Diffuse interstitial pattern to the lungs with some bronchiectasis and honeycomb changes likely representing usual interstitial pneumonitis or other chronic interstitial lung disease. 4. Upper esophageal dilatation with air-fluid level. No obstructing mass lesion is identified. Changes likely represent dysmotility although a stricture or occult obstructing lesion is not excluded. Consider follow-up versus endoscopy. 5. Aortic atherosclerosis.  Electronically Signed   By: Elsie Gravely M.D.   On: 06/15/2024 20:07   DG Chest Port 1  View Result Date: 06/14/2024 CLINICAL DATA:  Shortness of breath EXAM: PORTABLE CHEST 1 VIEW COMPARISON:  03/17/2024 FINDINGS: Cardiac pacemaker. Cardiac enlargement. Peribronchial thickening and interstitial changes throughout the lungs suggesting fibrosis and bronchitic changes. No airspace disease or consolidation. No pleural effusion or pneumothorax. Mediastinal contours appear intact. Calcified and tortuous aorta. Degenerative changes in the spine and shoulders. IMPRESSION: Chronic interstitial changes in the lungs.  No focal consolidation. Electronically Signed   By: Elsie Gravely M.D.   On: 06/14/2024 19:02    Microbiology: No results found for this or any previous visit (from the past 240 hours).  Time spent: 25 minutes  Signed: Elvan Sor, MD 2024/07/08

## 2024-07-09 NOTE — Death Summary Note (Incomplete)
 DEATH SUMMARY   Patient Details  Name: [name] MRN: [id number] DOB: [birth year] ERE:Dpffnwd-Mnapwdnw, Rockie, MD Admission/Discharge Information   Admit Date:  21-Jun-2024  Date of Death: Date of Death: July 04, 2024  Time of Death: Time of Death: 0924  Length of Stay: 27-Jun-2024   Principle Cause of death: ***  Hospital Diagnoses: Principal Problem:   Hyponatremia Active Problems:   Acute on chronic diastolic CHF (congestive heart failure) (HCC)   Primary hypertension   HLD (hyperlipidemia)   Paroxysmal atrial fibrillation (HCC)   Chronic kidney disease, stage 3b [facility])   Hospital Course: No notes on file  Assessment and Plan: No notes have been filed under this hospital service. Service: Hospitalist     {Tip this will not be part of the note when signed Body mass index is 26.54 kg/m. , ,  (Optional):26781}   Procedures: ***  Consultations: ***  The results of significant diagnostics from this hospitalization (including imaging, microbiology, ancillary and laboratory) are listed below for reference.   Significant Diagnostic Studies: US  RT LOWER EXTREM LTD SOFT TISSUE NON VASCULAR Result Date: 06/21/2024 CLINICAL DATA:  Acute right thigh pain EXAM: ULTRASOUND RIGHT LOWER EXTREMITY LIMITED TECHNIQUE: Ultrasound examination of the lower extremity soft tissues was performed in the area of clinical concern. COMPARISON:  None Available. FINDINGS: Limited grayscale and color Doppler sonography of the anterior right thigh in the area of reported focal tenderness demonstrates a corresponding complex lobulated mass demonstrating heterogeneous echogenicity located within the muscle belly of the anterior compartment of the right thigh. No significant internal vascularity is identified. This measures 9.0 x 3.0 x 4.3 cm and may represent a acute intramuscular hematoma or a hypovascular solid mass . IMPRESSION: 1. 9.0 x 3.0 x 4.3 cm complex lobulated mass within the muscle belly of the  anterior compartment of the right thigh. This may represent a acute intramuscular hematoma or a hypovascular solid mass. Contrast enhanced MRI examination is recommended further evaluation. Electronically Signed   By: Dorethia Molt M.D.   On: 06/21/2024 19:42   DG Pelvis 1-2 Views Result Date: 06/21/2024 CLINICAL DATA:  Right hip and upper thigh pain when moving or turning in bed. No trauma history is submitted. EXAM: PELVIS - 1-2 VIEW COMPARISON:  None Available. FINDINGS: Single AP view of the pelvis demonstrates vascular calcifications. Femoral heads are located. Joint spaces are maintained for age. Degenerative sclerosis of the bilateral sacroiliac joints. IMPRESSION: No acute osseous abnormality. Electronically Signed   By: Rockey Kilts M.D.   On: 06/21/2024 10:20   DG FEMUR, MIN 2 VIEWS RIGHT Result Date: 06/21/2024 CLINICAL DATA:  Acute right leg pain without reported injury. EXAM: RIGHT FEMUR 2 VIEWS COMPARISON:  None Available. FINDINGS: There is no evidence of fracture or other focal bone lesions. Soft tissues are unremarkable. IMPRESSION: Negative. Electronically Signed   By: Lynwood Landy Raddle M.D.   On: 06/21/2024 10:13   US  Venous Img Lower Unilateral Right (DVT) Result Date: 06/21/2024 CLINICAL DATA:  [id number] Right leg swelling [id number] EXAM: RIGHT LOWER EXTREMITY VENOUS DOPPLER ULTRASOUND TECHNIQUE: Gray-scale sonography with compression, as well as color and duplex ultrasound, were performed to evaluate the deep venous system(s) from the level of the common femoral vein through the popliteal and proximal calf veins. COMPARISON:  Lower extremity venous duplex, 03/12/2024. FINDINGS: VENOUS Normal compressibility of the RIGHT common femoral, superficial femoral, and popliteal veins, as well as the visualized calf veins. Visualized portions of profunda femoral vein and great saphenous vein unremarkable. No filling defects  to suggest DVT on grayscale or color Doppler imaging. Doppler waveforms show  normal direction of venous flow, normal respiratory plasticity and response to augmentation. Limited views of the contralateral common femoral vein are unremarkable. OTHER No evidence of superficial thrombophlebitis or abnormal fluid collection. Limitations: none IMPRESSION: No evidence of femoropopliteal DVT or superficial thrombophlebitis within the RIGHT lower extremity. Thom Hall, MD [facility] Radiology Electronically Signed   By: Thom Hall M.D.   On: 06/21/2024 07:00   CT CHEST WO CONTRAST Result Date: 06/15/2024 CLINICAL DATA:  Severe hyponatremia. Concern for underlying lung cancer. EXAM: CT CHEST WITHOUT CONTRAST TECHNIQUE: Multidetector CT imaging of the chest was performed following the standard protocol without IV contrast. RADIATION DOSE REDUCTION: This exam was performed according to the departmental dose-optimization program which includes automated exposure control, adjustment of the mA and/or kV according to patient size and/or use of iterative reconstruction technique. COMPARISON:  Chest radiograph 06/14/2024, 03/17/2024, in 02/03/2022 FINDINGS: Cardiovascular: Cardiac enlargement. No pericardial effusions. Cardiac pacemaker. Calcification in the mitral valve annulus, aortic, and coronary arteries. No aortic aneurysm. Mediastinum/Nodes: Thyroid gland is unremarkable. Esophagus is moderately distended and fluid-filled with air-fluid level down to the mid/distal esophageal region. No discrete obstructing lesion is identified. Changes most likely to represent dysmotility but underlying stricture is not excluded. Consider follow-up versus endoscopy. Scattered mediastinal lymph nodes are not pathologically enlarged. Lungs/Pleura: Small bilateral pleural effusions. Diffuse interstitial pattern to the lungs most prominent in the periphery and bases with some associated honeycomb changes and bronchiectasis. Changes likely to represent usual  interstitial pneumonitis. No superimposed consolidation or focal mass lesion identified. No pneumothorax. Upper Abdomen: No acute abnormalities. Musculoskeletal: Soft tissue edema in the chest wall and visualized upper abdominal wall. Degenerative changes in the spine and shoulders. Compression of T7 vertebra. This is unchanged since prior chest radiograph of 03/17/2024. No acute bony abnormalities. IMPRESSION: 1. Cardiac enlargement. 2. Small bilateral pleural effusions, greater on the right. 3. Diffuse interstitial pattern to the lungs with some bronchiectasis and honeycomb changes likely representing usual interstitial pneumonitis or other chronic interstitial lung disease. 4. Upper esophageal dilatation with air-fluid level. No obstructing mass lesion is identified. Changes likely represent dysmotility although a stricture or occult obstructing lesion is not excluded. Consider follow-up versus endoscopy. 5. Aortic atherosclerosis. Electronically Signed   By: Elsie Gravely M.D.   On: 06/15/2024 20:07   DG Chest Port 1 View Result Date: 06/14/2024 CLINICAL DATA:  Shortness of breath EXAM: PORTABLE CHEST 1 VIEW COMPARISON:  03/17/2024 FINDINGS: Cardiac pacemaker. Cardiac enlargement. Peribronchial thickening and interstitial changes throughout the lungs suggesting fibrosis and bronchitic changes. No airspace disease or consolidation. No pleural effusion or pneumothorax. Mediastinal contours appear intact. Calcified and tortuous aorta. Degenerative changes in the spine and shoulders. IMPRESSION: Chronic interstitial changes in the lungs.  No focal consolidation. Electronically Signed   By: Elsie Gravely M.D.   On: 06/14/2024 19:02    Microbiology: No results found for this or any previous visit (from the past 240 hours).  Time spent: *** minutes  Signed: Elvan Sor, MD {dischdate:[id number]}

## 2024-07-09 NOTE — Progress Notes (Signed)
  Chaplain On-Call responded to a page from [employer], who reported the death of the patient.  Chaplain arrived on Unit and received update from [employer].  Chaplain met patient's daughters [name] and [name] at bedside. Chaplain offered opportunities for life review, as the daughters spoke lovingly about the meaningful spiritual life of the patient, and their assurance that faith will guide them in this time of loss.  Chaplain provided spiritual and emotional support and prayer.  Chaplain Bebe Ardean EMERSON Hershal., [facility]

## 2024-07-09 DEATH — deceased

## 2024-07-28 ENCOUNTER — Encounter: Payer: Medicare PPO | Admitting: Family

## 2025-06-13 ENCOUNTER — Encounter

## 2025-09-12 ENCOUNTER — Encounter

## 2025-12-12 ENCOUNTER — Encounter

## 2026-03-13 ENCOUNTER — Encounter
